# Patient Record
Sex: Female | Born: 1955 | Race: Black or African American | Hispanic: No | Marital: Single | Smoking: Never smoker
Health system: Southern US, Community
[De-identification: ages and names within clinical notes are randomized; demographics above are authoritative.]

## PROBLEM LIST (undated history)

## (undated) DIAGNOSIS — R519 Headache, unspecified: Secondary | ICD-10-CM

## (undated) DIAGNOSIS — J309 Allergic rhinitis, unspecified: Secondary | ICD-10-CM

## (undated) DIAGNOSIS — K3184 Gastroparesis: Secondary | ICD-10-CM

## (undated) DIAGNOSIS — Z86010 Personal history of colonic polyps: Secondary | ICD-10-CM

## (undated) DIAGNOSIS — M1990 Unspecified osteoarthritis, unspecified site: Secondary | ICD-10-CM

## (undated) DIAGNOSIS — F419 Anxiety disorder, unspecified: Secondary | ICD-10-CM

## (undated) DIAGNOSIS — R51 Headache: Secondary | ICD-10-CM

## (undated) DIAGNOSIS — Z8669 Personal history of other diseases of the nervous system and sense organs: Secondary | ICD-10-CM

## (undated) DIAGNOSIS — E119 Type 2 diabetes mellitus without complications: Secondary | ICD-10-CM

## (undated) DIAGNOSIS — K219 Gastro-esophageal reflux disease without esophagitis: Secondary | ICD-10-CM

## (undated) DIAGNOSIS — D696 Thrombocytopenia, unspecified: Secondary | ICD-10-CM

## (undated) DIAGNOSIS — Z860101 Personal history of adenomatous and serrated colon polyps: Secondary | ICD-10-CM

## (undated) DIAGNOSIS — I10 Essential (primary) hypertension: Secondary | ICD-10-CM

## (undated) HISTORY — DX: Personal history of other diseases of the nervous system and sense organs: Z86.69

## (undated) HISTORY — DX: Essential (primary) hypertension: I10

## (undated) HISTORY — DX: Type 2 diabetes mellitus without complications: E11.9

## (undated) HISTORY — DX: Personal history of colonic polyps: Z86.010

## (undated) HISTORY — DX: Gastroparesis: K31.84

## (undated) HISTORY — DX: Thrombocytopenia, unspecified: D69.6

## (undated) HISTORY — PX: OTHER SURGICAL HISTORY: SHX169

## (undated) HISTORY — PX: ABDOMINAL HYSTERECTOMY: SHX81

## (undated) HISTORY — DX: Anxiety disorder, unspecified: F41.9

## (undated) HISTORY — DX: Gastro-esophageal reflux disease without esophagitis: K21.9

## (undated) HISTORY — DX: Allergic rhinitis, unspecified: J30.9

## (undated) HISTORY — DX: Personal history of adenomatous and serrated colon polyps: Z86.0101

---

## 2008-07-09 LAB — HM COLONOSCOPY

## 2015-06-25 DIAGNOSIS — E119 Type 2 diabetes mellitus without complications: Secondary | ICD-10-CM | POA: Insufficient documentation

## 2015-06-25 DIAGNOSIS — K219 Gastro-esophageal reflux disease without esophagitis: Secondary | ICD-10-CM | POA: Insufficient documentation

## 2015-06-25 DIAGNOSIS — J310 Chronic rhinitis: Secondary | ICD-10-CM | POA: Insufficient documentation

## 2015-07-15 ENCOUNTER — Ambulatory Visit: Payer: Self-pay | Admitting: Allergy and Immunology

## 2015-07-29 ENCOUNTER — Ambulatory Visit (INDEPENDENT_AMBULATORY_CARE_PROVIDER_SITE_OTHER): Payer: Medicare (Managed Care) | Admitting: Allergy and Immunology

## 2015-07-29 ENCOUNTER — Encounter: Payer: Self-pay | Admitting: Allergy and Immunology

## 2015-07-29 ENCOUNTER — Ambulatory Visit: Payer: Self-pay | Admitting: Allergy and Immunology

## 2015-07-29 VITALS — BP 128/76 | HR 72 | Temp 98.0°F | Resp 18

## 2015-07-29 DIAGNOSIS — J309 Allergic rhinitis, unspecified: Secondary | ICD-10-CM | POA: Diagnosis not present

## 2015-07-29 DIAGNOSIS — J310 Chronic rhinitis: Secondary | ICD-10-CM | POA: Diagnosis not present

## 2015-07-29 DIAGNOSIS — J3089 Other allergic rhinitis: Secondary | ICD-10-CM

## 2015-07-29 NOTE — Patient Instructions (Addendum)
Take Home Sheet  1. Avoidance: Mold   2. Antihistamine: Claritin 10 mg by mouth once daily for runny nose as needed.   3. Nasal Spray: Patanase 2 spray(s) each nostril twice daily for stuffy nose or drainage.       Nasacort AQ 2 sprays each nostril  Midday for stuffy nose.  (STOP Flonase)   4. Nasal Saline wash followed by nasal spray 2-3 times daily as directed.   5. Follow up Visit: 3-4 months or sooner if needed.   Websites that have reliable Patient information: 1. American Academy of Asthma, Allergy, & Immunology: www.aaaai.org 2. Food Allergy Network: www.foodallergy.org 3. Mothers of Asthmatics: www.aanma.org 4. [place]: DiningCalendar.de 5. American College of Allergy, Asthma, & Immunology: https://robertson.info/ or www.acaai.org

## 2015-08-04 MED ORDER — LORATADINE 10 MG PO TABS: 10.0000 mg | ORAL_TABLET | Freq: Every day | ORAL | Status: DC

## 2015-08-04 MED ORDER — TRIAMCINOLONE ACETONIDE 55 MCG/ACT NA AERO: 2.0000 | INHALATION_SPRAY | Freq: Every day | NASAL | Status: DC

## 2015-08-04 NOTE — Progress Notes (Signed)
FOLLOW UP NOTE  RE: Barbara House MRN: [id number] DOB: [birth year] [facility] [place] [address] Date of Office Visit: 07/29/2015  Subjective:  Barbara House is a 59 y.o. female who presents today for Follow-up; Nasal Congestion; and Other   HPI: Barbara House, who has a complex medical history including migraines, diabetes mellitus and gastroesophageal reflux returns to the office in follow-up of mixed rhinitis and associated intermittent cough.  Since her initial visit in August.  She has improved overall but still notes intermittent symptoms.  She finds occasional clear phlegm with her cough but not daily.  She denies discolored drainage, fever, sore throat, headache, disrupted sleep or activity.  She denies other associated or recurring issues or daily symptoms.  She has been using each of the nose sprays once daily.  She wonders about additional management.  She otherwise has no recollection of acute care or emergency department visits, prednisone or recent antibiotics.  Drug Allergies: No Known Allergies  Objective:   Filed Vitals:   07/29/15 1437  BP: 128/76  Pulse: 72  Temp: 98 F (36.7 C)  Resp: 18   Physical Exam  Constitutional: She is well-developed, well-nourished, and in no distress.  HENT:  Head: Atraumatic.  Right Ear: Tympanic membrane and ear canal normal.  Left Ear: Tympanic membrane and ear canal normal.  Nose: Mucosal edema and rhinorrhea present. No epistaxis.  No foreign bodies.  Mouth/Throat: Oropharynx is clear and moist and mucous membranes are normal. No oropharyngeal exudate, posterior oropharyngeal edema or posterior oropharyngeal erythema.  Neck: Neck supple.  Cardiovascular: Normal rate, S1 normal and S2 normal.   Pulmonary/Chest: Effort normal and breath sounds normal. She has no wheezes. She has no rhonchi. She has no rales.  Lymphadenopathy:    She has no  cervical adenopathy.      Assessment:   1. Chronic rhinitis   2. Perennial allergic rhinitis   3.      Previous history of cough, currently asymptomatic with clear lung exam. Plan:     Patient Instructions  Take Home Sheet  1. Avoidance: Mold   2. Antihistamine: Claritin 10 mg by mouth once daily for runny nose as needed.   3. Nasal Spray: Patanase 2 spray(s) each nostril twice daily for stuffy nose or drainage.       Nasacort AQ 2 sprays each nostril  Midday for stuffy nose.  (STOP Flonase)   4. Nasal Saline wash followed by nasal spray 2-3 times daily as directed.   5. Follow up Visit: 3-4 months or sooner if needed.   Websites that have reliable Patient information: 1. American Academy of Asthma, Allergy, & Immunology: www.aaaai.org 2. Food Allergy Network: www.foodallergy.org 3. Mothers of Asthmatics: www.aanma.org 4. [place]: DiningCalendar.de 5. American College of Allergy, Asthma, & Immunology: https://robertson.info/ or www.acaai.org       Roselyn M. Ishmael Holter, MD   VZ:CHYI Wenda Overland, MD

## 2015-08-18 ENCOUNTER — Other Ambulatory Visit: Payer: Self-pay

## 2015-08-18 MED ORDER — AZELASTINE HCL 0.15 % NA SOLN: 1.0000 | Freq: Every evening | NASAL | Status: DC | PRN

## 2015-10-26 DIAGNOSIS — M25531 Pain in right wrist: Secondary | ICD-10-CM | POA: Diagnosis not present

## 2015-10-26 DIAGNOSIS — M25571 Pain in right ankle and joints of right foot: Secondary | ICD-10-CM | POA: Diagnosis not present

## 2015-11-02 DIAGNOSIS — M25531 Pain in right wrist: Secondary | ICD-10-CM | POA: Diagnosis not present

## 2015-11-02 DIAGNOSIS — M25571 Pain in right ankle and joints of right foot: Secondary | ICD-10-CM | POA: Diagnosis not present

## 2015-11-04 DIAGNOSIS — M25571 Pain in right ankle and joints of right foot: Secondary | ICD-10-CM | POA: Diagnosis not present

## 2015-11-04 DIAGNOSIS — M25531 Pain in right wrist: Secondary | ICD-10-CM | POA: Diagnosis not present

## 2015-11-09 DIAGNOSIS — M25571 Pain in right ankle and joints of right foot: Secondary | ICD-10-CM | POA: Diagnosis not present

## 2015-11-09 DIAGNOSIS — M25531 Pain in right wrist: Secondary | ICD-10-CM | POA: Diagnosis not present

## 2015-11-11 DIAGNOSIS — M25571 Pain in right ankle and joints of right foot: Secondary | ICD-10-CM | POA: Diagnosis not present

## 2015-11-11 DIAGNOSIS — M25531 Pain in right wrist: Secondary | ICD-10-CM | POA: Diagnosis not present

## 2015-11-16 DIAGNOSIS — M25531 Pain in right wrist: Secondary | ICD-10-CM | POA: Diagnosis not present

## 2015-11-16 DIAGNOSIS — M25571 Pain in right ankle and joints of right foot: Secondary | ICD-10-CM | POA: Diagnosis not present

## 2015-11-18 DIAGNOSIS — M25531 Pain in right wrist: Secondary | ICD-10-CM | POA: Diagnosis not present

## 2015-11-18 DIAGNOSIS — M25571 Pain in right ankle and joints of right foot: Secondary | ICD-10-CM | POA: Diagnosis not present

## 2015-11-23 DIAGNOSIS — M25571 Pain in right ankle and joints of right foot: Secondary | ICD-10-CM | POA: Diagnosis not present

## 2015-11-23 DIAGNOSIS — M25531 Pain in right wrist: Secondary | ICD-10-CM | POA: Diagnosis not present

## 2015-11-26 DIAGNOSIS — M25571 Pain in right ankle and joints of right foot: Secondary | ICD-10-CM | POA: Diagnosis not present

## 2015-11-26 DIAGNOSIS — M25531 Pain in right wrist: Secondary | ICD-10-CM | POA: Diagnosis not present

## 2015-11-30 DIAGNOSIS — M25571 Pain in right ankle and joints of right foot: Secondary | ICD-10-CM | POA: Diagnosis not present

## 2015-11-30 DIAGNOSIS — M25531 Pain in right wrist: Secondary | ICD-10-CM | POA: Diagnosis not present

## 2015-12-01 DIAGNOSIS — J309 Allergic rhinitis, unspecified: Secondary | ICD-10-CM | POA: Diagnosis not present

## 2015-12-01 DIAGNOSIS — E118 Type 2 diabetes mellitus with unspecified complications: Secondary | ICD-10-CM | POA: Diagnosis not present

## 2016-01-03 DIAGNOSIS — Z1231 Encounter for screening mammogram for malignant neoplasm of breast: Secondary | ICD-10-CM | POA: Diagnosis not present

## 2016-01-10 DIAGNOSIS — R251 Tremor, unspecified: Secondary | ICD-10-CM | POA: Diagnosis not present

## 2016-01-10 DIAGNOSIS — R300 Dysuria: Secondary | ICD-10-CM | POA: Diagnosis not present

## 2016-01-20 DIAGNOSIS — M79671 Pain in right foot: Secondary | ICD-10-CM | POA: Diagnosis not present

## 2016-01-20 DIAGNOSIS — L600 Ingrowing nail: Secondary | ICD-10-CM | POA: Diagnosis not present

## 2016-01-20 DIAGNOSIS — E1149 Type 2 diabetes mellitus with other diabetic neurological complication: Secondary | ICD-10-CM | POA: Diagnosis not present

## 2016-01-20 DIAGNOSIS — M7741 Metatarsalgia, right foot: Secondary | ICD-10-CM | POA: Diagnosis not present

## 2016-02-01 DIAGNOSIS — G249 Dystonia, unspecified: Secondary | ICD-10-CM | POA: Diagnosis not present

## 2016-02-01 DIAGNOSIS — R2689 Other abnormalities of gait and mobility: Secondary | ICD-10-CM | POA: Diagnosis not present

## 2016-02-22 ENCOUNTER — Other Ambulatory Visit: Payer: Self-pay | Admitting: Allergy and Immunology

## 2016-02-23 DIAGNOSIS — H2513 Age-related nuclear cataract, bilateral: Secondary | ICD-10-CM | POA: Diagnosis not present

## 2016-02-23 DIAGNOSIS — E119 Type 2 diabetes mellitus without complications: Secondary | ICD-10-CM | POA: Diagnosis not present

## 2016-03-06 DIAGNOSIS — H40003 Preglaucoma, unspecified, bilateral: Secondary | ICD-10-CM | POA: Diagnosis not present

## 2016-03-06 DIAGNOSIS — G249 Dystonia, unspecified: Secondary | ICD-10-CM | POA: Diagnosis not present

## 2016-03-06 DIAGNOSIS — R2689 Other abnormalities of gait and mobility: Secondary | ICD-10-CM | POA: Diagnosis not present

## 2016-03-27 DIAGNOSIS — H40003 Preglaucoma, unspecified, bilateral: Secondary | ICD-10-CM | POA: Diagnosis not present

## 2016-03-29 DIAGNOSIS — R269 Unspecified abnormalities of gait and mobility: Secondary | ICD-10-CM | POA: Diagnosis not present

## 2016-03-29 DIAGNOSIS — R251 Tremor, unspecified: Secondary | ICD-10-CM | POA: Diagnosis not present

## 2016-03-30 DIAGNOSIS — E1165 Type 2 diabetes mellitus with hyperglycemia: Secondary | ICD-10-CM | POA: Diagnosis not present

## 2016-04-20 DIAGNOSIS — M7741 Metatarsalgia, right foot: Secondary | ICD-10-CM | POA: Diagnosis not present

## 2016-04-20 DIAGNOSIS — E1149 Type 2 diabetes mellitus with other diabetic neurological complication: Secondary | ICD-10-CM | POA: Diagnosis not present

## 2016-04-20 DIAGNOSIS — M79675 Pain in left toe(s): Secondary | ICD-10-CM | POA: Diagnosis not present

## 2016-04-20 DIAGNOSIS — L600 Ingrowing nail: Secondary | ICD-10-CM | POA: Diagnosis not present

## 2016-04-20 DIAGNOSIS — M79674 Pain in right toe(s): Secondary | ICD-10-CM | POA: Diagnosis not present

## 2016-04-20 DIAGNOSIS — M79671 Pain in right foot: Secondary | ICD-10-CM | POA: Diagnosis not present

## 2016-05-22 DIAGNOSIS — M7582 Other shoulder lesions, left shoulder: Secondary | ICD-10-CM | POA: Diagnosis not present

## 2016-07-05 DIAGNOSIS — L820 Inflamed seborrheic keratosis: Secondary | ICD-10-CM | POA: Diagnosis not present

## 2016-07-05 DIAGNOSIS — L309 Dermatitis, unspecified: Secondary | ICD-10-CM | POA: Diagnosis not present

## 2016-07-07 DIAGNOSIS — I209 Angina pectoris, unspecified: Secondary | ICD-10-CM | POA: Diagnosis not present

## 2016-07-07 DIAGNOSIS — J301 Allergic rhinitis due to pollen: Secondary | ICD-10-CM | POA: Diagnosis not present

## 2016-07-07 DIAGNOSIS — R0609 Other forms of dyspnea: Secondary | ICD-10-CM | POA: Diagnosis not present

## 2016-07-10 DIAGNOSIS — R269 Unspecified abnormalities of gait and mobility: Secondary | ICD-10-CM | POA: Diagnosis not present

## 2016-07-10 DIAGNOSIS — R251 Tremor, unspecified: Secondary | ICD-10-CM | POA: Diagnosis not present

## 2016-07-17 DIAGNOSIS — I209 Angina pectoris, unspecified: Secondary | ICD-10-CM | POA: Diagnosis not present

## 2016-07-17 DIAGNOSIS — R0609 Other forms of dyspnea: Secondary | ICD-10-CM | POA: Diagnosis not present

## 2016-07-17 DIAGNOSIS — R079 Chest pain, unspecified: Secondary | ICD-10-CM | POA: Diagnosis not present

## 2016-07-18 DIAGNOSIS — L600 Ingrowing nail: Secondary | ICD-10-CM | POA: Diagnosis not present

## 2016-07-18 DIAGNOSIS — E1149 Type 2 diabetes mellitus with other diabetic neurological complication: Secondary | ICD-10-CM | POA: Diagnosis not present

## 2016-07-26 ENCOUNTER — Encounter: Payer: Self-pay | Admitting: Cardiology

## 2016-07-26 ENCOUNTER — Encounter: Payer: Self-pay | Admitting: *Deleted

## 2016-07-26 DIAGNOSIS — Z1272 Encounter for screening for malignant neoplasm of vagina: Secondary | ICD-10-CM | POA: Diagnosis not present

## 2016-07-26 DIAGNOSIS — Z01419 Encounter for gynecological examination (general) (routine) without abnormal findings: Secondary | ICD-10-CM | POA: Diagnosis not present

## 2016-07-26 DIAGNOSIS — Z6836 Body mass index (BMI) 36.0-36.9, adult: Secondary | ICD-10-CM | POA: Diagnosis not present

## 2016-07-26 NOTE — Progress Notes (Signed)
Cardiology Office Note  Date: 07/27/2016   ID: Barbara House, DOB 09/26/1956, MRN [id number]  PCP: Celedonio Savage, MD  Consulting Cardiologist: Rozann Lesches, MD   Chief Complaint  Patient presents with  . Abnormal stress test    History of Present Illness: Barbara House is a 60 y.o. female referred for cardiology consultation by Dr. Wenda Overland. Limited records were provided. She tells me that she has been experiencing intermittent chest tightness and shortness of breath with activity since September. She recalls a significant episode when she was outdoors trying to help a family member push mow her lawn. She thought that this might have been indigestion, she does take Prilosec for reflux, however these have been much more intense.  She was referred for a Myoview which was done recently at [facility] as outlined below indicating inferior apical ischemia with normal LVEF. We discussed the implications of this test result as it relates to ischemic heart disease, particularly in light of her history of diabetes mellitus. We also discussed options for management including medical therapy and potentially revascularization.  I personally reviewed her ECG today which shows sinus rhythm with nonspecific T-wave changes and Q-wave in lead III.  Past Medical History:  Diagnosis Date  . Allergic rhinitis   . GERD (gastroesophageal reflux disease)   . History of migraine   . Type 2 diabetes mellitus ([place])     Past Surgical History:  Procedure Laterality Date  . Miinor skin surgery      Current Outpatient Prescriptions  Medication Sig Dispense Refill  . aspirin EC 81 MG tablet Take 81 mg by mouth daily.    . Azelastine HCl 0.15 % SOLN Place 1 spray into the nose at bedtime as needed. 30 mL 5  . Benzonatate (TESSALON PERLES PO) Take by mouth as needed.    . busPIRone (BUSPAR) 5 MG tablet Take 5 mg by mouth as needed.    . canagliflozin (INVOKANA) 300 MG TABS tablet Take 300 mg by mouth daily.     . Cholecalciferol (VITAMIN D PO) Take by mouth.    . cyclobenzaprine (FLEXERIL) 10 MG tablet Take 10 mg by mouth as needed for muscle spasms.    . fluticasone (FLONASE) 50 MCG/ACT nasal spray Place 1-2 sprays into both nostrils every morning.    . gabapentin (NEURONTIN) 400 MG capsule Take 400 mg by mouth as needed.    [name] glipiZIDE (GLUCOTROL) 5 MG tablet Take 5 mg by mouth daily.    [name] HYOSCYAMINE SULFATE PO Take by mouth as needed.    [name] lisinopril (PRINIVIL,ZESTRIL) 20 MG tablet Take 20 mg by mouth daily.    [name] loratadine (CLARITIN) 10 MG tablet TAKE ONE TABLET BY MOUTH DAILY 30 tablet 1  . metFORMIN (GLUCOPHAGE) 1000 MG tablet Take 1,000 mg by mouth 2 (two) times daily.    Eddie Candle EX Apply topically as needed.    . Olopatadine HCl (PATANASE NA) Place 1-2 sprays into the nose at bedtime.    [name] omeprazole (PRILOSEC) 20 MG capsule Take 20 mg by mouth daily.    [name] PARoxetine (PAXIL) 20 MG tablet Take 20 mg by mouth daily.    . traMADol (ULTRAM) 50 MG tablet Take by mouth as needed.    . triamcinolone (NASACORT AQ) 55 MCG/ACT AERO nasal inhaler Place 2 sprays into the nose daily. 1 Inhaler 5  . triamcinolone cream (KENALOG) 0.1 % Apply 1 application topically as needed.    . verapamil (VERELAN PM) 240 MG 24 hr  capsule Take 240 mg by mouth daily.     No current facility-administered medications for this visit.    Allergies:  Review of patient's allergies indicates no known allergies.   Social History: The patient  reports that she has never smoked. She has never used smokeless tobacco.   Family History: The patient's family history includes Hypertension in her mother and son.   ROS:  Please see the history of present illness. Otherwise, complete review of systems is positive for reflux.  All other systems are reviewed and negative.   Physical Exam: VS:  BP (!) 148/84   Pulse 77   Ht 5\' 1"  (1.549 m)   Wt 197 lb (89.4 kg)   SpO2 98%   BMI 37.22 kg/m , BMI Body mass index is 37.22  kg/m.  Wt Readings from Last 3 Encounters:  07/27/16 197 lb (89.4 kg)    General: Overweight woman, appears comfortable at rest. HEENT: Conjunctiva and lids normal, oropharynx clear. Neck: Supple, no elevated JVP or carotid bruits, no thyromegaly. Lungs: Clear to auscultation, nonlabored breathing at rest. Cardiac: Regular rate and rhythm, no S3 or significant systolic murmur, no pericardial rub. Abdomen: Soft, nontender, bowel sounds present, no guarding or rebound. Extremities: No pitting edema, distal pulses 1-2+. Skin: Warm and dry. Musculoskeletal: No kyphosis. Neuropsychiatric: Alert and oriented x3, affect grossly appropriate.  ECG: There is no old tracing for comparison.  Other Studies Reviewed Today:  Lexiscan Myoview 07/17/2016 [facility]): No diagnostic ST segment abnormalities. Reported mild, reversible inferior apical defect suggesting ischemia, LVEF 63%. Overall low risk.  Assessment and Plan:  1. Patient presents with symptoms concerning for accelerating exertional angina since September. Recent Myoview at [facility] indicated inferior apical ischemia with normal LVEF. She continues to report regular symptoms. She has a long-standing history of type 2 diabetes mellitus. Today we discussed management options including invasive testing to assess for revascularization options. After reviewing the risks and benefits of a diagnostic cardiac catheterization, she is in agreement to proceed, and this will be scheduled at her convenience.  2. Type 2 diabetes mellitus, followed by Dr. Wenda Overland. She is on Glucophage, Glucotrol, and Invokana.  3. Uncertain lipid status. She is currently not on statin therapy. This will need to be further evaluated.  4. GERD, currently on Prilosec.  Current medicines were reviewed with the patient today.   Orders Placed This Encounter  Procedures  . CBC  . Basic metabolic panel  . APTT  . Protime-INR  . EKG 12-Lead    Disposition: Follow-up  with me after cardiac catheterization.  Signed, Satira Sark, MD, [facility] 07/27/2016 11:42 AM    [place] at [place], [place], [place] [address] Phone: [phone]; Fax: [phone]

## 2016-07-27 ENCOUNTER — Telehealth: Payer: Self-pay | Admitting: Cardiology

## 2016-07-27 ENCOUNTER — Encounter: Payer: Self-pay | Admitting: *Deleted

## 2016-07-27 ENCOUNTER — Other Ambulatory Visit: Payer: Self-pay | Admitting: Cardiology

## 2016-07-27 ENCOUNTER — Ambulatory Visit (INDEPENDENT_AMBULATORY_CARE_PROVIDER_SITE_OTHER): Payer: Medicare Other | Admitting: Cardiology

## 2016-07-27 ENCOUNTER — Encounter: Payer: Self-pay | Admitting: Cardiology

## 2016-07-27 VITALS — BP 148/84 | HR 77 | Ht 61.0 in | Wt 197.0 lb

## 2016-07-27 DIAGNOSIS — I200 Unstable angina: Secondary | ICD-10-CM | POA: Diagnosis not present

## 2016-07-27 DIAGNOSIS — E1140 Type 2 diabetes mellitus with diabetic neuropathy, unspecified: Secondary | ICD-10-CM | POA: Diagnosis not present

## 2016-07-27 DIAGNOSIS — R9439 Abnormal result of other cardiovascular function study: Secondary | ICD-10-CM

## 2016-07-27 DIAGNOSIS — Z01812 Encounter for preprocedural laboratory examination: Secondary | ICD-10-CM

## 2016-07-27 DIAGNOSIS — K219 Gastro-esophageal reflux disease without esophagitis: Secondary | ICD-10-CM | POA: Diagnosis not present

## 2016-07-27 NOTE — Telephone Encounter (Signed)
No precert required.  Pt has Medicare.  Her [place] so may not pay remaining portion if we are out of network

## 2016-07-27 NOTE — Patient Instructions (Signed)
Medication Instructions:  Continue all current medications.  Labwork: BMET, CBC, PT, PTT - orders given today.  Testing/Procedures: Your physician has requested that you have a cardiac catheterization. Cardiac catheterization is used to diagnose and/or treat various heart conditions. Doctors may recommend this procedure for a number of different reasons. The most common reason is to evaluate chest pain. Chest pain can be a symptom of coronary artery disease (CAD), and cardiac catheterization can show whether plaque is narrowing or blocking your heart's arteries. This procedure is also used to evaluate the valves, as well as measure the blood flow and oxygen levels in different parts of your heart. For further information please visit HugeFiesta.tn. Please follow instruction sheet, as given.  Follow-Up: 2-3 weeks post cath   Any Other Special Instructions Will Be Listed Below (If Applicable).  If you need a refill on your cardiac medications before your next appointment, please call your pharmacy.

## 2016-07-27 NOTE — Telephone Encounter (Signed)
Left heart cath - Thursday, 08/03/16 - 7:30 - Barbara House   Checking percert

## 2016-07-28 DIAGNOSIS — I200 Unstable angina: Secondary | ICD-10-CM | POA: Diagnosis not present

## 2016-07-28 DIAGNOSIS — R9439 Abnormal result of other cardiovascular function study: Secondary | ICD-10-CM | POA: Diagnosis not present

## 2016-07-28 DIAGNOSIS — Z01812 Encounter for preprocedural laboratory examination: Secondary | ICD-10-CM | POA: Diagnosis not present

## 2016-07-28 LAB — PROTIME-INR

## 2016-08-03 ENCOUNTER — Ambulatory Visit (HOSPITAL_COMMUNITY)
Admission: RE | Admit: 2016-08-03 | Discharge: 2016-08-03 | Disposition: A | Discharge status: Home / Self Care | Payer: Medicare Other | Source: Ambulatory Visit | Attending: Cardiology | Admitting: Cardiology

## 2016-08-03 ENCOUNTER — Encounter (HOSPITAL_COMMUNITY): Payer: Self-pay | Admitting: Cardiology

## 2016-08-03 ENCOUNTER — Encounter (HOSPITAL_COMMUNITY)
Admission: RE | Disposition: A | Discharge status: Home / Self Care | Payer: Self-pay | Source: Ambulatory Visit | Attending: Cardiology

## 2016-08-03 DIAGNOSIS — G43909 Migraine, unspecified, not intractable, without status migrainosus: Secondary | ICD-10-CM | POA: Diagnosis not present

## 2016-08-03 DIAGNOSIS — Z7982 Long term (current) use of aspirin: Secondary | ICD-10-CM | POA: Diagnosis not present

## 2016-08-03 DIAGNOSIS — I209 Angina pectoris, unspecified: Secondary | ICD-10-CM | POA: Diagnosis present

## 2016-08-03 DIAGNOSIS — J309 Allergic rhinitis, unspecified: Secondary | ICD-10-CM | POA: Insufficient documentation

## 2016-08-03 DIAGNOSIS — E119 Type 2 diabetes mellitus without complications: Secondary | ICD-10-CM

## 2016-08-03 DIAGNOSIS — Z7984 Long term (current) use of oral hypoglycemic drugs: Secondary | ICD-10-CM | POA: Diagnosis not present

## 2016-08-03 DIAGNOSIS — R9439 Abnormal result of other cardiovascular function study: Secondary | ICD-10-CM | POA: Diagnosis present

## 2016-08-03 DIAGNOSIS — Z8249 Family history of ischemic heart disease and other diseases of the circulatory system: Secondary | ICD-10-CM | POA: Diagnosis not present

## 2016-08-03 DIAGNOSIS — Z7951 Long term (current) use of inhaled steroids: Secondary | ICD-10-CM | POA: Diagnosis not present

## 2016-08-03 DIAGNOSIS — K219 Gastro-esophageal reflux disease without esophagitis: Secondary | ICD-10-CM | POA: Diagnosis not present

## 2016-08-03 HISTORY — PX: CARDIAC CATHETERIZATION: SHX172

## 2016-08-03 LAB — GLUCOSE, CAPILLARY: Glucose-Capillary: 141 mg/dL — ABNORMAL HIGH (ref 65–99)

## 2016-08-03 SURGERY — LEFT HEART CATH AND CORONARY ANGIOGRAPHY

## 2016-08-03 MED ORDER — FENTANYL CITRATE (PF) 100 MCG/2ML IJ SOLN
INTRAMUSCULAR | Status: DC | PRN
  Administered 2016-08-03: 25 ug via INTRAVENOUS

## 2016-08-03 MED ORDER — HEPARIN (PORCINE) IN NACL 2-0.9 UNIT/ML-% IJ SOLN
INTRAMUSCULAR | Status: AC
  Filled 2016-08-03: qty 1000

## 2016-08-03 MED ORDER — ATORVASTATIN CALCIUM 80 MG PO TABS
80.0000 mg | ORAL_TABLET | ORAL | Status: AC
  Administered 2016-08-03: 80 mg via ORAL
  Filled 2016-08-03: qty 1

## 2016-08-03 MED ORDER — IOPAMIDOL (ISOVUE-370) INJECTION 76%
INTRAVENOUS | Status: AC
Start: 2016-08-03 — End: 2016-08-03
  Filled 2016-08-03: qty 100

## 2016-08-03 MED ORDER — MIDAZOLAM HCL 2 MG/2ML IJ SOLN
INTRAMUSCULAR | Status: AC
  Filled 2016-08-03: qty 2

## 2016-08-03 MED ORDER — FENTANYL CITRATE (PF) 100 MCG/2ML IJ SOLN
INTRAMUSCULAR | Status: AC
  Filled 2016-08-03: qty 2

## 2016-08-03 MED ORDER — HEPARIN SODIUM (PORCINE) 1000 UNIT/ML IJ SOLN
INTRAMUSCULAR | Status: DC | PRN
Start: 2016-08-03 — End: 2016-08-03
  Administered 2016-08-03: 4500 [IU] via INTRAVENOUS

## 2016-08-03 MED ORDER — ATORVASTATIN CALCIUM 80 MG PO TABS
80.0000 mg | ORAL_TABLET | Freq: Every day | ORAL | Status: DC
  Filled 2016-08-03: qty 1

## 2016-08-03 MED ORDER — MIDAZOLAM HCL 2 MG/2ML IJ SOLN
INTRAMUSCULAR | Status: DC | PRN
Start: 2016-08-03 — End: 2016-08-03
  Administered 2016-08-03: 1 mg via INTRAVENOUS

## 2016-08-03 MED ORDER — ASPIRIN 81 MG PO CHEW
CHEWABLE_TABLET | ORAL | Status: AC
  Filled 2016-08-03: qty 1

## 2016-08-03 MED ORDER — SODIUM CHLORIDE 0.9% FLUSH: 3.0000 mL | Freq: Two times a day (BID) | INTRAVENOUS | Status: DC

## 2016-08-03 MED ORDER — SODIUM CHLORIDE 0.9 % IV SOLN
INTRAVENOUS | Status: DC
Start: 2016-08-03 — End: 2016-08-03
  Administered 2016-08-03: 06:00:00 via INTRAVENOUS

## 2016-08-03 MED ORDER — SODIUM CHLORIDE 0.9 % IV SOLN: 250.0000 mL | INTRAVENOUS | Status: DC | PRN

## 2016-08-03 MED ORDER — SODIUM CHLORIDE 0.9 % WEIGHT BASED INFUSION: 1.0000 mL/kg/h | INTRAVENOUS | Status: DC

## 2016-08-03 MED ORDER — METFORMIN HCL 500 MG PO TABS
1000.0000 mg | ORAL_TABLET | Freq: Every day | ORAL | Status: DC
  Filled 2016-08-03: qty 2

## 2016-08-03 MED ORDER — VERAPAMIL HCL 2.5 MG/ML IV SOLN
INTRAVENOUS | Status: DC | PRN
  Administered 2016-08-03: 10 mL via INTRA_ARTERIAL

## 2016-08-03 MED ORDER — HEPARIN (PORCINE) IN NACL 2-0.9 UNIT/ML-% IJ SOLN
INTRAMUSCULAR | Status: DC | PRN
  Administered 2016-08-03: 500 mL

## 2016-08-03 MED ORDER — HEPARIN SODIUM (PORCINE) 1000 UNIT/ML IJ SOLN
INTRAMUSCULAR | Status: AC
  Filled 2016-08-03: qty 1

## 2016-08-03 MED ORDER — SODIUM CHLORIDE 0.9% FLUSH: 3.0000 mL | INTRAVENOUS | Status: DC | PRN

## 2016-08-03 MED ORDER — LIDOCAINE HCL (PF) 1 % IJ SOLN
INTRAMUSCULAR | Status: AC
  Filled 2016-08-03: qty 30

## 2016-08-03 MED ORDER — IOPAMIDOL (ISOVUE-370) INJECTION 76%
INTRAVENOUS | Status: DC | PRN
  Administered 2016-08-03: 80 mL via INTRA_ARTERIAL

## 2016-08-03 MED ORDER — LIDOCAINE HCL (PF) 1 % IJ SOLN
INTRAMUSCULAR | Status: DC | PRN
  Administered 2016-08-03: 2 mL via INTRADERMAL

## 2016-08-03 MED ORDER — HEPARIN (PORCINE) IN NACL 2-0.9 UNIT/ML-% IJ SOLN
INTRAMUSCULAR | Status: AC
  Filled 2016-08-03: qty 500

## 2016-08-03 MED ORDER — VERAPAMIL HCL 2.5 MG/ML IV SOLN
INTRAVENOUS | Status: AC
  Filled 2016-08-03: qty 2

## 2016-08-03 MED ORDER — ASPIRIN 81 MG PO CHEW
81.0000 mg | CHEWABLE_TABLET | ORAL | Status: AC
  Administered 2016-08-03: 81 mg via ORAL

## 2016-08-03 SURGICAL SUPPLY — 12 items
CATH 5FR JL3.5 JR4 ANG PIG MP (CATHETERS) ×3 IMPLANT
COVER PRB 48X5XTLSCP FOLD TPE (BAG) ×1 IMPLANT
COVER PROBE 5X48 (BAG) ×2
DEVICE RAD COMP TR BAND LRG (VASCULAR PRODUCTS) ×3 IMPLANT
GLIDESHEATH SLEND SS 6F .021 (SHEATH) ×3 IMPLANT
KIT HEART LEFT (KITS) ×3 IMPLANT
PACK CARDIAC CATHETERIZATION (CUSTOM PROCEDURE TRAY) ×3 IMPLANT
SYR MEDRAD MARK V 150ML (SYRINGE) ×3 IMPLANT
TRANSDUCER W/STOPCOCK (MISCELLANEOUS) ×3 IMPLANT
TUBING CIL FLEX 10 FLL-RA (TUBING) ×3 IMPLANT
WIRE HI TORQ VERSACORE-J 145CM (WIRE) ×3 IMPLANT
WIRE SAFE-T 1.5MM-J .035X260CM (WIRE) ×3 IMPLANT

## 2016-08-03 NOTE — Interval H&P Note (Signed)
History and Physical Interval Note:  08/03/2016 7:22 AM  Barbara House  has presented today for surgery, with the diagnosis of abnormal stress test  The various methods of treatment have been discussed with the patient and family. After consideration of risks, benefits and other options for treatment, the patient has consented to  Procedure(s): Left Heart Cath and Coronary Angiography (N/A) as a surgical intervention .  The patient's history has been reviewed, patient examined, no change in status, stable for surgery.  I have reviewed the patient's chart and labs.  Questions were answered to the patient's satisfaction.    Cath Lab Visit (complete for each Cath Lab visit)  Clinical Evaluation Leading to the Procedure:   ACS: No.  Non-ACS:    Anginal Classification: CCS III  Anti-ischemic medical therapy: Minimal Therapy (1 class of medications)  Non-Invasive Test Results: Low-risk stress test findings: cardiac mortality <1%/year  Prior CABG: No previous CABG       [facility] 08/03/2016 7:22 AM

## 2016-08-03 NOTE — Discharge Instructions (Signed)
Radial Site Care °Refer to this sheet in the next few weeks. These instructions provide you with information about caring for yourself after your procedure. Your health care provider may also give you more specific instructions. Your treatment has been planned according to current medical practices, but problems sometimes occur. Call your health care provider if you have any problems or questions after your procedure. °WHAT TO EXPECT AFTER THE PROCEDURE °After your procedure, it is typical to have the following: °· Bruising at the radial site that usually fades within 1-2 weeks. °· Blood collecting in the tissue (hematoma) that may be painful to the touch. It should usually decrease in size and tenderness within 1-2 weeks. °HOME CARE INSTRUCTIONS °· Take medicines only as directed by your health care provider. °· You may shower 24-48 hours after the procedure or as directed by your health care provider. Remove the bandage (dressing) and gently wash the site with plain soap and water. Pat the area dry with a clean towel. Do not rub the site, because this may cause bleeding. °· Do not take baths, swim, or use a hot tub until your health care provider approves. °· Check your insertion site every day for redness, swelling, or drainage. °· Do not apply powder or lotion to the site. °· Do not flex or bend the affected arm for 24 hours or as directed by your health care provider. °· Do not push or pull heavy objects with the affected arm for 24 hours or as directed by your health care provider. °· Do not lift over 10 lb (4.5 kg) for 5 days after your procedure or as directed by your health care provider. °· Ask your health care provider when it is okay to: °¨ Return to work or school. °¨ Resume usual physical activities or sports. °¨ Resume sexual activity. °· Do not drive home if you are discharged the same day as the procedure. Have someone else drive you. °· You may drive 24 hours after the procedure unless otherwise  instructed by your health care provider. °· Do not operate machinery or power tools for 24 hours after the procedure. °· If your procedure was done as an outpatient procedure, which means that you went home the same day as your procedure, a responsible adult should be with you for the first 24 hours after you arrive home. °· Keep all follow-up visits as directed by your health care provider. This is important. °SEEK MEDICAL CARE IF: °· You have a fever. °· You have chills. °· You have increased bleeding from the radial site. Hold pressure on the site. °SEEK IMMEDIATE MEDICAL CARE IF: °· You have unusual pain at the radial site. °· You have redness, warmth, or swelling at the radial site. °· You have drainage (other than a small amount of blood on the dressing) from the radial site. °· The radial site is bleeding, and the bleeding does not stop after 30 minutes of holding steady pressure on the site. °· Your arm or hand becomes pale, cool, tingly, or numb. °  °This information is not intended to replace advice given to you by your health care provider. Make sure you discuss any questions you have with your health care provider. °  °Document Released: 11/04/2010 Document Revised: 10/23/2014 Document Reviewed: 04/20/2014 °Elsevier Interactive Patient Education ©2016 Elsevier Inc. ° °

## 2016-08-03 NOTE — H&P (View-Only) (Signed)
Cardiology Office Note  Date: 07/27/2016   ID: Barbara House, DOB 03-25-1956, MRN [id number]  PCP: Celedonio Savage, MD  Consulting Cardiologist: Rozann Lesches, MD   Chief Complaint  Patient presents with  . Abnormal stress test    History of Present Illness: Barbara House is a 60 y.o. female referred for cardiology consultation by Dr. Wenda Overland. Limited records were provided. She tells me that she has been experiencing intermittent chest tightness and shortness of breath with activity since September. She recalls a significant episode when she was outdoors trying to help a family member push mow her lawn. She thought that this might have been indigestion, she does take Prilosec for reflux, however these have been much more intense.  She was referred for a Myoview which was done recently at [facility] as outlined below indicating inferior apical ischemia with normal LVEF. We discussed the implications of this test result as it relates to ischemic heart disease, particularly in light of her history of diabetes mellitus. We also discussed options for management including medical therapy and potentially revascularization.  I personally reviewed her ECG today which shows sinus rhythm with nonspecific T-wave changes and Q-wave in lead III.  Past Medical History:  Diagnosis Date  . Allergic rhinitis   . GERD (gastroesophageal reflux disease)   . History of migraine   . Type 2 diabetes mellitus ([place])     Past Surgical History:  Procedure Laterality Date  . Miinor skin surgery      Current Outpatient Prescriptions  Medication Sig Dispense Refill  . aspirin EC 81 MG tablet Take 81 mg by mouth daily.    . Azelastine HCl 0.15 % SOLN Place 1 spray into the nose at bedtime as needed. 30 mL 5  . Benzonatate (TESSALON PERLES PO) Take by mouth as needed.    . busPIRone (BUSPAR) 5 MG tablet Take 5 mg by mouth as needed.    . canagliflozin (INVOKANA) 300 MG TABS tablet Take 300 mg by mouth daily.     . Cholecalciferol (VITAMIN D PO) Take by mouth.    . cyclobenzaprine (FLEXERIL) 10 MG tablet Take 10 mg by mouth as needed for muscle spasms.    . fluticasone (FLONASE) 50 MCG/ACT nasal spray Place 1-2 sprays into both nostrils every morning.    . gabapentin (NEURONTIN) 400 MG capsule Take 400 mg by mouth as needed.    [name] glipiZIDE (GLUCOTROL) 5 MG tablet Take 5 mg by mouth daily.    [name] HYOSCYAMINE SULFATE PO Take by mouth as needed.    [name] lisinopril (PRINIVIL,ZESTRIL) 20 MG tablet Take 20 mg by mouth daily.    [name] loratadine (CLARITIN) 10 MG tablet TAKE ONE TABLET BY MOUTH DAILY 30 tablet 1  . metFORMIN (GLUCOPHAGE) 1000 MG tablet Take 1,000 mg by mouth 2 (two) times daily.    Eddie Candle EX Apply topically as needed.    . Olopatadine HCl (PATANASE NA) Place 1-2 sprays into the nose at bedtime.    [name] omeprazole (PRILOSEC) 20 MG capsule Take 20 mg by mouth daily.    [name] PARoxetine (PAXIL) 20 MG tablet Take 20 mg by mouth daily.    . traMADol (ULTRAM) 50 MG tablet Take by mouth as needed.    . triamcinolone (NASACORT AQ) 55 MCG/ACT AERO nasal inhaler Place 2 sprays into the nose daily. 1 Inhaler 5  . triamcinolone cream (KENALOG) 0.1 % Apply 1 application topically as needed.    . verapamil (VERELAN PM) 240 MG 24 hr  capsule Take 240 mg by mouth daily.     No current facility-administered medications for this visit.    Allergies:  Review of patient's allergies indicates no known allergies.   Social History: The patient  reports that she has never smoked. She has never used smokeless tobacco.   Family History: The patient's family history includes Hypertension in her mother and son.   ROS:  Please see the history of present illness. Otherwise, complete review of systems is positive for reflux.  All other systems are reviewed and negative.   Physical Exam: VS:  BP (!) 148/84   Pulse 77   Ht 5\' 1"  (1.549 m)   Wt 197 lb (89.4 kg)   SpO2 98%   BMI 37.22 kg/m , BMI Body mass index is 37.22  kg/m.  Wt Readings from Last 3 Encounters:  07/27/16 197 lb (89.4 kg)    General: Overweight woman, appears comfortable at rest. HEENT: Conjunctiva and lids normal, oropharynx clear. Neck: Supple, no elevated JVP or carotid bruits, no thyromegaly. Lungs: Clear to auscultation, nonlabored breathing at rest. Cardiac: Regular rate and rhythm, no S3 or significant systolic murmur, no pericardial rub. Abdomen: Soft, nontender, bowel sounds present, no guarding or rebound. Extremities: No pitting edema, distal pulses 1-2+. Skin: Warm and dry. Musculoskeletal: No kyphosis. Neuropsychiatric: Alert and oriented x3, affect grossly appropriate.  ECG: There is no old tracing for comparison.  Other Studies Reviewed Today:  Lexiscan Myoview 07/17/2016 [facility]): No diagnostic ST segment abnormalities. Reported mild, reversible inferior apical defect suggesting ischemia, LVEF 63%. Overall low risk.  Assessment and Plan:  1. Patient presents with symptoms concerning for accelerating exertional angina since September. Recent Myoview at [facility] indicated inferior apical ischemia with normal LVEF. She continues to report regular symptoms. She has a long-standing history of type 2 diabetes mellitus. Today we discussed management options including invasive testing to assess for revascularization options. After reviewing the risks and benefits of a diagnostic cardiac catheterization, she is in agreement to proceed, and this will be scheduled at her convenience.  2. Type 2 diabetes mellitus, followed by Dr. Wenda Overland. She is on Glucophage, Glucotrol, and Invokana.  3. Uncertain lipid status. She is currently not on statin therapy. This will need to be further evaluated.  4. GERD, currently on Prilosec.  Current medicines were reviewed with the patient today.   Orders Placed This Encounter  Procedures  . CBC  . Basic metabolic panel  . APTT  . Protime-INR  . EKG 12-Lead    Disposition: Follow-up  with me after cardiac catheterization.  Signed, Satira Sark, MD, [facility] 07/27/2016 11:42 AM    [place] at [place], [place], [place] [address] Phone: [phone]; Fax: [phone]

## 2016-08-04 DIAGNOSIS — Z124 Encounter for screening for malignant neoplasm of cervix: Secondary | ICD-10-CM | POA: Diagnosis not present

## 2016-08-04 DIAGNOSIS — E1165 Type 2 diabetes mellitus with hyperglycemia: Secondary | ICD-10-CM | POA: Diagnosis not present

## 2016-08-04 DIAGNOSIS — E559 Vitamin D deficiency, unspecified: Secondary | ICD-10-CM | POA: Diagnosis not present

## 2016-08-04 DIAGNOSIS — I10 Essential (primary) hypertension: Secondary | ICD-10-CM | POA: Diagnosis not present

## 2016-08-04 DIAGNOSIS — Z1231 Encounter for screening mammogram for malignant neoplasm of breast: Secondary | ICD-10-CM | POA: Diagnosis not present

## 2016-08-04 DIAGNOSIS — E7801 Familial hypercholesterolemia: Secondary | ICD-10-CM | POA: Diagnosis not present

## 2016-08-04 DIAGNOSIS — Z6826 Body mass index (BMI) 26.0-26.9, adult: Secondary | ICD-10-CM | POA: Diagnosis not present

## 2016-08-04 DIAGNOSIS — I999 Unspecified disorder of circulatory system: Secondary | ICD-10-CM | POA: Diagnosis not present

## 2016-08-04 DIAGNOSIS — Z1211 Encounter for screening for malignant neoplasm of colon: Secondary | ICD-10-CM | POA: Diagnosis not present

## 2016-08-04 DIAGNOSIS — Z0000 Encounter for general adult medical examination without abnormal findings: Secondary | ICD-10-CM | POA: Diagnosis not present

## 2016-08-21 NOTE — Progress Notes (Signed)
Cardiology Office Note  Date: 08/23/2016   ID: NAYOMI BARUCH, DOB 1956-10-06, MRN [id number]  PCP: Celedonio Savage, MD  Primary Cardiologist: Rozann Lesches, MD   Chief Complaint  Patient presents with  . Follow-up cardiac catheterization    History of Present Illness: ARLYCE WOLLARD is a 60 y.o. female seen recently in consultation in follow-up of an abnormal myocardial perfusion study done at [facility] with symptoms concerning for angina.. She was referred for a diagnostic cardiac catheterization performed by Dr. [place] which fortunately revealed normal coronary arteries and LVEF, moderately elevated LVEDP.  She presents today for follow-up. We discussed the results of the procedure. She has a recent URI, otherwise no major change in symptoms. I reviewed her medications and we discussed the possibility of her starting on a low-dose diuretic with Dr. Wenda Overland such as chlorthalidone. If any of her symptoms are related to diastolic dysfunction with increased LV filling pressure, this may be helpful.  Past Medical History:  Diagnosis Date  . Allergic rhinitis   . GERD (gastroesophageal reflux disease)   . History of migraine   . Type 2 diabetes mellitus ([place])     Current Outpatient Prescriptions  Medication Sig Dispense Refill  . aspirin EC 81 MG tablet Take 81 mg by mouth daily.    [name] augmented betamethasone dipropionate (DIPROLENE-AF) 0.05 % cream Apply 1 application topically 2 (two) times daily as needed (for itching).    . Azelastine HCl 0.15 % SOLN Place 1 spray into the nose at bedtime as needed. 30 mL 5  . benzonatate (TESSALON) 100 MG capsule Take 100 mg by mouth 3 (three) times daily as needed for cough.    . busPIRone (BUSPAR) 10 MG tablet Take 10 mg by mouth 3 (three) times daily as needed (for anxiety).    . canagliflozin (INVOKANA) 300 MG TABS tablet Take 300 mg by mouth daily.    . cholecalciferol (VITAMIN D) 1000 units tablet Take 1,000 Units by mouth daily.    .  cycloSPORINE (RESTASIS) 0.05 % ophthalmic emulsion Place 1 drop into both eyes 2 (two) times daily as needed (for eye dryness.).    [name] fluticasone (FLONASE) 50 MCG/ACT nasal spray Place 1-2 sprays into both nostrils daily as needed (for sinus issues.).     [name] glipiZIDE (GLUCOTROL) 5 MG tablet Take 5 mg by mouth 2 (two) times daily.     [name] lisinopril (PRINIVIL,ZESTRIL) 20 MG tablet Take 20 mg by mouth every evening.     . loratadine (CLARITIN) 10 MG tablet TAKE ONE TABLET BY MOUTH DAILY (Patient taking differently: TAKE ONE TABLET BY MOUTH DAILY AS NEEDED FOR ALLERGIES.) 30 tablet 1  . metFORMIN (GLUCOPHAGE) 1000 MG tablet Take 1 tablet by mouth 2 (two) times daily.    . montelukast (SINGULAIR) 10 MG tablet Take 10 mg by mouth at bedtime as needed (for allergies.).    [name] Olopatadine HCl (PATANASE NA) Place 1-2 sprays into the nose at bedtime as needed (for sinus drainage.).     [name] omeprazole (PRILOSEC) 20 MG capsule Take 20 mg by mouth daily.    [name] PARoxetine (PAXIL) 20 MG tablet Take 20 mg by mouth every evening.     . traZODone (DESYREL) 50 MG tablet Take 50-100 mg by mouth at bedtime as needed for sleep.    [name] triamcinolone (NASACORT AQ) 55 MCG/ACT AERO nasal inhaler Place 2 sprays into the nose daily. 1 Inhaler 5  . trihexyphenidyl (ARTANE) 2 MG tablet Take 1 mg by  mouth 3 (three) times daily.    . verapamil (CALAN-SR) 240 MG CR tablet Take 240 mg by mouth every evening.     No current facility-administered medications for this visit.    Allergies:  Patient has no known allergies.   Social History: The patient  reports that she has never smoked. She has never used smokeless tobacco.   ROS:  Please see the history of present illness. Otherwise, complete review of systems is positive for cold symptoms.  All other systems are reviewed and negative.   Physical Exam: VS:  BP (!) 147/65   Pulse 91   Ht 5\' 1"  (1.549 m)   Wt 195 lb (88.5 kg)   BMI 36.84 kg/m , BMI Body mass index is 36.84  kg/m.  Wt Readings from Last 3 Encounters:  08/23/16 195 lb (88.5 kg)  08/03/16 194 lb (88 kg)  07/27/16 197 lb (89.4 kg)    General: Overweight woman, appears comfortable at rest. HEENT: Conjunctiva and lids normal, oropharynx clear. Neck: Supple, no elevated JVP or carotid bruits, no thyromegaly. Lungs: Clear to auscultation, nonlabored breathing at rest. Cardiac: Regular rate and rhythm, no S3 or significant systolic murmur, no pericardial rub. Abdomen: Soft, nontender, bowel sounds present, no guarding or rebound. Extremities: No pitting edema, distal pulses 1-2+. Healed right radial arteriotomy site.  ECG: I personally reviewed the tracing from 07/27/2016 which showed sinus rhythm with nonspecific T-wave changes and Q-wave in lead III.  Recent Labwork:  October 2017: Hemoglobin 13.4, platelets 97, BUN 15, creatinine 0.8, potassium 4.3, INR 1.0  Other Studies Reviewed Today:  Cardiac catheterization 08/03/2016:  The left ventricular systolic function is normal.  LV end diastolic pressure is moderately elevated.   1. Normal coronary anatomy 2. Normal LV function 3. Elevated LVEDP  Assessment and Plan:  1. Exertional chest discomfort and abnormal Myoview with subsequent cardiac catheterization showing no evidence of obstructive CAD. She did have elevated LV filling pressures in the setting of diabetes mellitus and hypertension. She does not require any further cardiac testing at this time. Consider adding a low dose diuretic such as chlorthalidone to her antihypertensive regimen as this may help with symptoms. Also tight control of diabetes mellitus and weight loss would be helpful.  2. Uncertain lipid status. Keep follow with Dr. Wenda Overland, consider FLP in light of diabetes mellitus.  Current medicines were reviewed with the patient today.  Disposition: Follow-up as needed.  Signed, Satira Sark, MD, [facility] 08/23/2016 11:18 AM    [place] at  [place], [address] [place], [place] [address] Phone: [phone]; Fax: [phone]

## 2016-08-23 ENCOUNTER — Ambulatory Visit (INDEPENDENT_AMBULATORY_CARE_PROVIDER_SITE_OTHER): Payer: Medicare Other | Admitting: Cardiology

## 2016-08-23 ENCOUNTER — Encounter: Payer: Self-pay | Admitting: Cardiology

## 2016-08-23 VITALS — BP 147/65 | HR 91 | Ht 61.0 in | Wt 195.0 lb

## 2016-08-23 DIAGNOSIS — Z0389 Encounter for observation for other suspected diseases and conditions ruled out: Secondary | ICD-10-CM

## 2016-08-23 DIAGNOSIS — IMO0001 Reserved for inherently not codable concepts without codable children: Secondary | ICD-10-CM

## 2016-08-23 DIAGNOSIS — I200 Unstable angina: Secondary | ICD-10-CM

## 2016-08-23 DIAGNOSIS — R9439 Abnormal result of other cardiovascular function study: Secondary | ICD-10-CM

## 2016-08-23 DIAGNOSIS — E1140 Type 2 diabetes mellitus with diabetic neuropathy, unspecified: Secondary | ICD-10-CM | POA: Diagnosis not present

## 2016-08-23 NOTE — Patient Instructions (Signed)
Your physician recommends that you schedule a follow-up appointment in: AS NEEDED WITH DR MCDOWELL  Your physician recommends that you continue on your current medications as directed. Please refer to the Current Medication list given to you today.  Thank you for choosing [facility]!!    

## 2016-09-13 DIAGNOSIS — N200 Calculus of kidney: Secondary | ICD-10-CM | POA: Diagnosis not present

## 2016-09-14 ENCOUNTER — Ambulatory Visit: Payer: Medicare (Managed Care) | Admitting: Allergy & Immunology

## 2016-09-14 ENCOUNTER — Encounter (INDEPENDENT_AMBULATORY_CARE_PROVIDER_SITE_OTHER): Payer: Self-pay

## 2016-09-14 ENCOUNTER — Ambulatory Visit (INDEPENDENT_AMBULATORY_CARE_PROVIDER_SITE_OTHER): Payer: Medicare Other | Admitting: Allergy & Immunology

## 2016-09-14 ENCOUNTER — Encounter: Payer: Self-pay | Admitting: Allergy & Immunology

## 2016-09-14 VITALS — BP 126/72 | HR 58 | Temp 98.0°F | Resp 18 | Ht 60.5 in | Wt 191.2 lb

## 2016-09-14 DIAGNOSIS — M79675 Pain in left toe(s): Secondary | ICD-10-CM | POA: Diagnosis not present

## 2016-09-14 DIAGNOSIS — J0100 Acute maxillary sinusitis, unspecified: Secondary | ICD-10-CM | POA: Diagnosis not present

## 2016-09-14 DIAGNOSIS — J3089 Other allergic rhinitis: Secondary | ICD-10-CM | POA: Diagnosis not present

## 2016-09-14 DIAGNOSIS — M79674 Pain in right toe(s): Secondary | ICD-10-CM | POA: Diagnosis not present

## 2016-09-14 DIAGNOSIS — L600 Ingrowing nail: Secondary | ICD-10-CM | POA: Diagnosis not present

## 2016-09-14 MED ORDER — FLUTICASONE PROPIONATE 50 MCG/ACT NA SUSP: 1.0000 | Freq: Every day | NASAL | 5 refills | Status: DC | PRN

## 2016-09-14 MED ORDER — AMOXICILLIN-POT CLAVULANATE 875-125 MG PO TABS: 1.0000 | ORAL_TABLET | Freq: Two times a day (BID) | ORAL | 0 refills | Status: AC

## 2016-09-14 MED ORDER — OLOPATADINE HCL 0.6 % NA SOLN: NASAL | 5 refills | Status: DC

## 2016-09-14 NOTE — Progress Notes (Signed)
FOLLOW UP  Date of Service/Encounter:  09/14/16   Assessment:   Acute non-recurrent maxillary sinusitis  Perennial allergic rhinitis   Plan/Recommendations:   1. Acute non-recurrent maxillary sinusitis  - Start Augmentin 875mg  one tablet twice daily for 14 days. - Use nasal saline rinses 1-2 times per day to help keep things clear. - Continue with Flonase 2 sprays per nostril daily (samples provided) - Continue with Patanase 2 sprays per nostril twice daily as needed.  - Use Allegra or Xyzal as needed for breakthrough symptoms (samples provided) - Deferred use of prednisone since she has had an adverse reaction to prednisone in the past (hair loss).  2. Return in about 3 months (around 12/13/2016) in [place]  Subjective:   Barbara House is a 60 y.o. female presenting today for follow up of  Chief Complaint  Patient presents with  . Nasal Congestion    over 2 weeks  . Cough    productive cough (yellow)  .  Barbara House has a history of the following: Patient Active Problem List   Diagnosis Date Noted  . Angina pectoris ([place]) 08/03/2016  . Abnormal nuclear stress test 08/03/2016  . Abnormal myocardial perfusion study   . Rhinitis 06/25/2015  . GERD (gastroesophageal reflux disease) 06/25/2015  . Diabetes mellitus ([place]) 06/25/2015    History obtained from: chart review and patient.  Barbara House was referred by Celedonio Savage, MD.     Barbara House is a 60 y.o. female presenting for a follow up visit. Her last visit was one year ago with Dr. Ishmael Holter who has since left the practice. At that time, she was continued on her allergy regimen including Claritin 10mg  daily, Patanase 2 sprays per nostril BID, and Nasacort 2 sprays per nostril midday for nasal congestion. She was also encouraged to use nasal saline. She was instructed to follow up in 3-4 months but she never came back.   She reports that she has been sick for over two weeks with copious phlegm with left  sinus pressure. She is having dark yellow with blood tinged. She endorses "burning" with breathing as well as an "earthy cough" and dry mouth. She did purchase the Mucinex which seems to have helped somewhat. She is not using nasal saline rinses. She does have a history of allergic rhinitis and has Flonase which she uses only as needed. Previous tested has been positive to mold. She does not have any asthma or other atopic disease.  Otherwise, there have been no changes to her past medical history, surgical history, family history, or social history.    Review of Systems: a 14-point review of systems is pertinent for what is mentioned in HPI.  Otherwise, all other systems were negative. Constitutional: negative other than that listed in the HPI Eyes: negative other than that listed in the HPI Ears, nose, mouth, throat, and face: negative other than that listed in the HPI Respiratory: negative other than that listed in the HPI Cardiovascular: negative other than that listed in the HPI Gastrointestinal: negative other than that listed in the HPI Genitourinary: negative other than that listed in the HPI Integument: negative other than that listed in the HPI Hematologic: negative other than that listed in the HPI Musculoskeletal: negative other than that listed in the HPI Neurological: negative other than that listed in the HPI Allergy/Immunologic: negative other than that listed in the HPI    Objective:   Blood pressure 126/72, pulse (!) 58, temperature 98 F (36.7 C),  temperature source Oral, resp. rate 18, height 5' 0.5" (1.537 m), weight 191 lb 3.2 oz (86.7 kg), SpO2 96 %. Body mass index is 36.73 kg/m.   Physical Exam:  General: Alert, interactive, in no acute distress. Cooperative with the exam. Very friendly and appreciative.  HEENT: TMs pearly gray, turbinates markedly edematous with thick discharge, post-pharynx markedly erythematous. Neck: Supple without  thyromegaly. Lungs: Clear to auscultation without wheezing, rhonchi or rales. No increased work of breathing. CV: Normal S1/S2, no murmurs. Capillary refill <2 seconds.  Abdomen: Nondistended, nontender. No guarding or rebound tenderness. Bowel sounds present in all fields and hyperactive  Skin: Warm and dry, without lesions or rashes. Extremities:  No clubbing, cyanosis or edema. Neuro:   Grossly intact.  Diagnostic studies: None     Salvatore Marvel, MD [place] [facility] [place]

## 2016-09-14 NOTE — Patient Instructions (Addendum)
1. Acute non-recurrent maxillary sinusitis  - Start Augmentin 875mg  one tablet twice daily for 14 days. - Use nasal saline rinses 1-2 times per day to help keep things clear. - Continue with Flonase 2 sprays per nostril daily.  - Continue with Patanase 2 sprays per nostril twice daily as needed.  - Use Allegra or Xyzal as needed for breakthrough symptoms.   2. Return in about 3 months (around 12/13/2016) in [place]  Please inform us of any Emergency Department visits, hospitalizations, or changes in symptoms. Call us before going to the ED for breathing or allergy symptoms since we might be able to fit you in for a sick visit. Feel free to contact us anytime with any questions, problems, or concerns.  It was a pleasure to meet you today!   Websites that have reliable patient information: 1. American Academy of Asthma, Allergy, and Immunology: www.aaaai.org 2. Food Allergy Research and Education (FARE): foodallergy.org 3. Mothers of Asthmatics: http://www.asthmacommunitynetwork.org 4. American College of Allergy, Asthma, and Immunology: www.acaai.org

## 2016-10-02 DIAGNOSIS — W6149XA Other contact with turkey, initial encounter: Secondary | ICD-10-CM | POA: Diagnosis not present

## 2016-10-02 DIAGNOSIS — M25532 Pain in left wrist: Secondary | ICD-10-CM | POA: Diagnosis not present

## 2016-10-02 DIAGNOSIS — M79642 Pain in left hand: Secondary | ICD-10-CM | POA: Diagnosis not present

## 2016-10-02 DIAGNOSIS — N200 Calculus of kidney: Secondary | ICD-10-CM | POA: Diagnosis not present

## 2016-10-02 DIAGNOSIS — S6990XA Unspecified injury of unspecified wrist, hand and finger(s), initial encounter: Secondary | ICD-10-CM | POA: Diagnosis not present

## 2016-10-02 DIAGNOSIS — F329 Major depressive disorder, single episode, unspecified: Secondary | ICD-10-CM | POA: Diagnosis not present

## 2016-10-02 DIAGNOSIS — S61211A Laceration without foreign body of left index finger without damage to nail, initial encounter: Secondary | ICD-10-CM | POA: Diagnosis not present

## 2016-10-02 DIAGNOSIS — E119 Type 2 diabetes mellitus without complications: Secondary | ICD-10-CM | POA: Diagnosis not present

## 2016-10-02 DIAGNOSIS — Z888 Allergy status to other drugs, medicaments and biological substances status: Secondary | ICD-10-CM | POA: Diagnosis not present

## 2016-11-03 DIAGNOSIS — M778 Other enthesopathies, not elsewhere classified: Secondary | ICD-10-CM | POA: Diagnosis not present

## 2016-11-03 DIAGNOSIS — G5602 Carpal tunnel syndrome, left upper limb: Secondary | ICD-10-CM | POA: Diagnosis not present

## 2016-11-06 ENCOUNTER — Telehealth: Payer: Self-pay | Admitting: *Deleted

## 2016-11-06 NOTE — Telephone Encounter (Signed)
Pt has a question regarding her bill. Would like a return call asap

## 2016-11-07 NOTE — Telephone Encounter (Signed)
No answer - no voicemell

## 2016-11-08 NOTE — Telephone Encounter (Signed)
Pt will try to pay on MM & Epic month or every other month

## 2016-11-14 DIAGNOSIS — E1149 Type 2 diabetes mellitus with other diabetic neurological complication: Secondary | ICD-10-CM | POA: Diagnosis not present

## 2016-11-14 DIAGNOSIS — L600 Ingrowing nail: Secondary | ICD-10-CM | POA: Diagnosis not present

## 2016-11-29 DIAGNOSIS — B309 Viral conjunctivitis, unspecified: Secondary | ICD-10-CM | POA: Diagnosis not present

## 2016-11-29 DIAGNOSIS — J069 Acute upper respiratory infection, unspecified: Secondary | ICD-10-CM | POA: Diagnosis not present

## 2016-11-30 DIAGNOSIS — H1033 Unspecified acute conjunctivitis, bilateral: Secondary | ICD-10-CM | POA: Diagnosis not present

## 2016-12-07 DIAGNOSIS — E1165 Type 2 diabetes mellitus with hyperglycemia: Secondary | ICD-10-CM | POA: Diagnosis not present

## 2016-12-08 DIAGNOSIS — H1033 Unspecified acute conjunctivitis, bilateral: Secondary | ICD-10-CM | POA: Diagnosis not present

## 2016-12-14 ENCOUNTER — Ambulatory Visit: Payer: Medicare Other | Admitting: Allergy & Immunology

## 2016-12-19 DIAGNOSIS — L03031 Cellulitis of right toe: Secondary | ICD-10-CM | POA: Diagnosis not present

## 2016-12-20 DIAGNOSIS — H538 Other visual disturbances: Secondary | ICD-10-CM | POA: Diagnosis not present

## 2016-12-20 DIAGNOSIS — H524 Presbyopia: Secondary | ICD-10-CM | POA: Diagnosis not present

## 2016-12-20 DIAGNOSIS — H5213 Myopia, bilateral: Secondary | ICD-10-CM | POA: Diagnosis not present

## 2017-01-02 ENCOUNTER — Encounter: Payer: Self-pay | Admitting: Allergy & Immunology

## 2017-01-02 ENCOUNTER — Ambulatory Visit (INDEPENDENT_AMBULATORY_CARE_PROVIDER_SITE_OTHER): Payer: Medicare Other | Admitting: Allergy & Immunology

## 2017-01-02 VITALS — BP 120/70 | HR 60 | Temp 97.7°F | Resp 16

## 2017-01-02 DIAGNOSIS — K219 Gastro-esophageal reflux disease without esophagitis: Secondary | ICD-10-CM | POA: Diagnosis not present

## 2017-01-02 DIAGNOSIS — J3089 Other allergic rhinitis: Secondary | ICD-10-CM

## 2017-01-02 MED ORDER — LEVOCETIRIZINE DIHYDROCHLORIDE 5 MG PO TABS: 5.0000 mg | ORAL_TABLET | Freq: Every evening | ORAL | 3 refills | Status: DC

## 2017-01-02 MED ORDER — DEXLANSOPRAZOLE 30 MG PO CPDR: 30.0000 mg | DELAYED_RELEASE_CAPSULE | Freq: Every day | ORAL | 3 refills | Status: DC

## 2017-01-02 MED ORDER — AMOXICILLIN-POT CLAVULANATE 875-125 MG PO TABS: 1.0000 | ORAL_TABLET | Freq: Two times a day (BID) | ORAL | 0 refills | Status: AC

## 2017-01-02 MED ORDER — OLOPATADINE HCL 0.6 % NA SOLN: NASAL | 3 refills | Status: DC

## 2017-01-02 MED ORDER — MONTELUKAST SODIUM 10 MG PO TABS: 10.0000 mg | ORAL_TABLET | Freq: Every evening | ORAL | 3 refills | Status: DC | PRN

## 2017-01-02 NOTE — Progress Notes (Signed)
FOLLOW UP  Date of Service/Encounter:  01/02/17   Assessment:   Perennial allergic rhinitis  Gastroesophageal reflux disease  Plan/Recommendations:   1. Perennial allergic rhinitis - I would recommend that you use nasal sprays on a daily basis: nasal saline rinses twice daily + Flonase two sprays per nostril twice daily + Patanase two sprays per nostril 1-2 times daily - Continue with montelukast 10mg  daily. - Add Xyzal 5mg  daily.  - These medications need to be used every day for the best effect. - We will give you another course of antibiotics to see if this helps. - If you continue to have problems, we will need to get a sinus CT.  - She has seen an ENT in the past but this has been several years and the ENT has since retired.  2. GERD - Stop the omeprazole and start Dexilant 30mg  daily. - Prescription sent in and samples provided.  3. Return in about 3 months (around 04/04/2017).   Subjective:   Barbara House is a 61 y.o. female presenting today for follow up of  Chief Complaint  Patient presents with  . Otalgia  . Nasal Congestion    Barbara House has a history of the following: Patient Active Problem List   Diagnosis Date Noted  . Angina pectoris ([place]) 08/03/2016  . Abnormal nuclear stress test 08/03/2016  . Abnormal myocardial perfusion study   . Rhinitis 06/25/2015  . GERD (gastroesophageal reflux disease) 06/25/2015  . Diabetes mellitus ([place]) 06/25/2015    History obtained from: chart review and patient, whose history is all over the place.  Barbara House was referred by Celedonio Savage, MD.     Barbara House is a 61 y.o. female presenting for a follow up visit. She was last seen in November 2017. At that time, she was diagnosed with sinusitis and started on 2 weeks of Augmentin. She was encouraged to use nasal saline rinses, Flonase, and Patanase. She was also provided with Allegra and Xyzal samples.  Since last visit, she has continued to have nasal  congestion with sinus pressure. She has constant sinus pressure on the left side. She feels drainage on a nearly continuous basis. She has not had no additional antibiotic since the last time I saw her. However she has not been using her sprays consistently at all, only using them "when she feels really bad". However I cannot seem to figure out what the trigger is exactly, as she also endorses constant sinus pressure and nasal congestion. She is unsure of any particular triggers for her nasal symptoms, as she seems to constantly have problems throughout the year. She does not remember what she was allergic to when she was last skin tested. Per previous notes, she has been sensitized to molds. She has never been on allergy shots.   Barbara House does have a history of GERD and takes a "purple pill" daily. She denies chest pain or pressure and feels that her reflux is under good control. But she does have a cough that is present only at night. She has not changed her GERD medication in quite some time.   Barbara House does have a history of diabetes and hypertension. She has had an abnormal myocardial perfusion study in the past as recently as November 2017, but a subsequent cardiac catheterization was normal. Otherwise, there have been no changes to her past medical history, surgical history, family history, or social history.    Review of Systems: a 14-point review of  systems is pertinent for what is mentioned in HPI.  Otherwise, all other systems were negative. Constitutional: negative other than that listed in the HPI Eyes: negative other than that listed in the HPI Ears, nose, mouth, throat, and face: negative other than that listed in the HPI Respiratory: negative other than that listed in the HPI Cardiovascular: negative other than that listed in the HPI Gastrointestinal: negative other than that listed in the HPI Genitourinary: negative other than that listed in the HPI Integument: negative other  than that listed in the HPI Hematologic: negative other than that listed in the HPI Musculoskeletal: negative other than that listed in the HPI Neurological: negative other than that listed in the HPI Allergy/Immunologic: negative other than that listed in the HPI    Objective:   Blood pressure 120/70, pulse 60, temperature 97.7 F (36.5 C), temperature source Oral, resp. rate 16, SpO2 97 %. There is no height or weight on file to calculate BMI.   Physical Exam:  General: Alert, interactive, in no acute distress. Pleasant female. Talkative.  Eyes: No conjunctival injection present on the right, No conjunctival injection present on the left, PERRL bilaterally, No discharge on the right, No discharge on the left, No Horner-Trantas dots present and allergic shiners present bilaterally Ears: Right TM pearly gray with normal light reflex, Left TM pearly gray with normal light reflex, Right TM intact without perforation and Left TM intact without perforation.  Nose/Throat: External nose within normal limits, nasal crease present and septum midline, turbinates edematous with clear discharge, post-pharynx erythematous without cobblestoning in the posterior oropharynx. Tonsils 2+ without exudates Neck: Supple without thyromegaly. Lungs: Clear to auscultation without wheezing, rhonchi or rales. No increased work of breathing. CV: Normal S1/S2, no murmurs. Capillary refill <2 seconds.  Skin: Warm and dry, without lesions or rashes. Neuro:   Grossly intact. No focal deficits appreciated. Responsive to questions.   Diagnostic studies: none    Salvatore Marvel, MD [place] [facility] [place]

## 2017-01-02 NOTE — Patient Instructions (Addendum)
1. Perennial allergic rhinitis - I would recommend that you use nasal sprays on a daily basis: nasal saline rinses twice daily + Flonase two sprays per nostril twice daily + Patanase two sprays per nostril 1-2 times daily - Continue with montelukast 10mg  daily. - Add Xyzal 5mg  daily.  - These medications need to be used every day for the best effect. - We will give you another course of antibiotics to see if this helps. - If you continue to have problems, we will need to get a sinus CT.   2. GERD (reflux) - Stop the omeprazole and start Dexilant 30mg  daily. - Prescription sent in.   3. Return in about 3 months (around 04/04/2017).  Please inform us of any Emergency Department visits, hospitalizations, or changes in symptoms. Call us before going to the ED for breathing or allergy symptoms since we might be able to fit you in for a sick visit. Feel free to contact us anytime with any questions, problems, or concerns.  It was a pleasure to see you again today! Happy spring! I hope you enjoy the snow tomorrow!   Websites that have reliable patient information: 1. American Academy of Asthma, Allergy, and Immunology: www.aaaai.org 2. Food Allergy Research and Education (FARE): foodallergy.org 3. Mothers of Asthmatics: http://www.asthmacommunitynetwork.org 4. American College of Allergy, Asthma, and Immunology: www.acaai.org

## 2017-01-04 DIAGNOSIS — M778 Other enthesopathies, not elsewhere classified: Secondary | ICD-10-CM | POA: Diagnosis not present

## 2017-01-04 DIAGNOSIS — G5602 Carpal tunnel syndrome, left upper limb: Secondary | ICD-10-CM | POA: Diagnosis not present

## 2017-01-04 DIAGNOSIS — Z1231 Encounter for screening mammogram for malignant neoplasm of breast: Secondary | ICD-10-CM | POA: Diagnosis not present

## 2017-01-10 ENCOUNTER — Other Ambulatory Visit: Payer: Self-pay | Admitting: Allergy & Immunology

## 2017-01-10 NOTE — Telephone Encounter (Signed)
Called patient and informed her that we sent in the script for dexilant 30mg  on  01/02/17 with 3 refills. Patient stated she did not realize that is was sent in, if she has anymore questions she will give [place] a call.

## 2017-01-10 NOTE — Telephone Encounter (Signed)
Patient is calling for a refill on her acid reflux medication Patient states that GALLAGHEr gave her a new medication but when it was called in there were only 5 pills in the bottle Patient takes medication daily Patient needs a refill on this medication called in to [facility] in [place] Patient would like a phone call back about how many refills are called in and when she can pick them up

## 2017-01-11 DIAGNOSIS — L600 Ingrowing nail: Secondary | ICD-10-CM | POA: Diagnosis not present

## 2017-02-02 DIAGNOSIS — R251 Tremor, unspecified: Secondary | ICD-10-CM | POA: Diagnosis not present

## 2017-02-02 DIAGNOSIS — R269 Unspecified abnormalities of gait and mobility: Secondary | ICD-10-CM | POA: Diagnosis not present

## 2017-02-14 DIAGNOSIS — M67432 Ganglion, left wrist: Secondary | ICD-10-CM | POA: Diagnosis not present

## 2017-02-14 DIAGNOSIS — I10 Essential (primary) hypertension: Secondary | ICD-10-CM | POA: Diagnosis not present

## 2017-03-21 ENCOUNTER — Telehealth: Payer: Self-pay | Admitting: Allergy & Immunology

## 2017-03-21 NOTE — Telephone Encounter (Signed)
Patient stated that mailed 2 payments out last week. I for [employer] acct # [id number] for $9.16, the other for EPIC account for $7.80. She asked to have a letter statement mailed to her with the amounts owed on each of these accounts after those payments are or were applied. Thanks

## 2017-03-28 NOTE — Telephone Encounter (Signed)
I talked to Barbara House this morning and told we did receive her payments of $9.16 for account [id number] and $7.80 on EPIC.   I gave her the new balances and told her a statement would be mailed out.

## 2017-03-29 DIAGNOSIS — N898 Other specified noninflammatory disorders of vagina: Secondary | ICD-10-CM | POA: Diagnosis not present

## 2017-04-03 ENCOUNTER — Encounter: Payer: Self-pay | Admitting: Allergy & Immunology

## 2017-04-03 ENCOUNTER — Ambulatory Visit (INDEPENDENT_AMBULATORY_CARE_PROVIDER_SITE_OTHER): Payer: Medicare Other | Admitting: Allergy & Immunology

## 2017-04-03 VITALS — BP 126/72 | HR 63 | Temp 97.6°F | Resp 20 | Ht 61.0 in | Wt 196.0 lb

## 2017-04-03 DIAGNOSIS — J3089 Other allergic rhinitis: Secondary | ICD-10-CM

## 2017-04-03 DIAGNOSIS — K219 Gastro-esophageal reflux disease without esophagitis: Secondary | ICD-10-CM | POA: Diagnosis not present

## 2017-04-03 DIAGNOSIS — J329 Chronic sinusitis, unspecified: Secondary | ICD-10-CM

## 2017-04-03 NOTE — Patient Instructions (Addendum)
1. Perennial allergic rhinitis (molds) - Continue with recommend that you use nasal sprays on a daily basis: nasal saline rinses twice daily + Flonase two sprays per nostril twice daily + Patanase two sprays per nostril 1-2 times daily - Continue with montelukast 10mg  daily. - Continue with Xyzal 5mg  daily.  - These medications need to be used every day for the best effect. - Use nasal saline rinses 1-2 times daily.  - Restart Mucinex one tablet twice daily to help with mucous drainage.  - We will get environmental allergy testing to see if there is any evidence of environmental allergies.  - We will try to get the records of the sinus CT from [facility]  - Once we have this information, we can refer you to see an [place] physician.   2. GERD (reflux) - Continue with Dexilant 30mg  daily.  3. Itching with rash  - You can take an extra Xyzal (levocetirizine) as needed for the itching and rash.  - We will send in hydrocortisone 2.5% ointment as needed for the rash (safe to use on the face).   4. It is important that you call to get a new Primary Care Provider: Dr. Freida Busman at [facility] [phone]).   5. Return in about 2 months (around 06/03/2017).  Please inform us of any Emergency Department visits, hospitalizations, or changes in symptoms. Call us before going to the ED for breathing or allergy symptoms since we might be able to fit you in for a sick visit. Feel free to contact us anytime with any questions, problems, or concerns.  It was a pleasure to see you again today! Have a wonderful summer!   Websites that have reliable patient information: 1. American Academy of Asthma, Allergy, and Immunology: www.aaaai.org 2. Food Allergy Research and Education (FARE): foodallergy.org 3. Mothers of Asthmatics: http://www.asthmacommunitynetwork.org 4. American College of Allergy, Asthma, and Immunology: www.acaai.org

## 2017-04-03 NOTE — Progress Notes (Signed)
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 Perennial allergic rhinitis (molds) - Continue with recommend that you use nasal sprays on a daily basis: nasal saline rinses twice daily + Flonase two sprays per nostril twice daily + Patanase two sprays per nostril 1-2 times daily - Continue with montelukast 10mg  daily. - Continue with Xyzal 5mg  daily.  - These medications need to be used every day for the best effect. - Use nasal saline rinses 1-2 times daily.  - Restart Mucinex one tablet twice daily to help with mucous drainage.  - She would benefit from allergy shots, however review of her last testing in August 2016 showed only mild reactivity to a minor mold mix. - We will get environmental allergy testing to see if there is any evidence of environmental allergies.  - We will try to get the records of the sinus CT from [facility].  - I'm still not convinced that she had a sinus CT performed, as her history is quite awful. - Once we have this information, we can refer you to see an [facility] physician.  - She does have a history of migraines, therefore this could be a manifestation of this as well.  - We are at a point that we need some outside records before proceeding with further management.   2. GERD (reflux) - Continue with Dexilant 30mg  daily.  3. Itching with rash  - You can take an extra Xyzal (levocetirizine) as needed for the itching and rash.  - We will send in hydrocortisone 2.5% ointment as needed for the rash (safe to use on the face).   4. Complex medical history including diabetes mellitus and migraines  5. It is important that you call to get a new Primary Care Provider: Dr. Freida Busman at [facility] [phone]).   6. Return in about  2 months (around 06/03/2017).   Subjective:   Barbara House is a 61 y.o. female presenting today for follow up of  Chief Complaint  Patient presents with  . Allergic Rhinitis     Barbara House has a history of the following: Patient Active Problem List   Diagnosis Date Noted  . Angina pectoris ([place]) 08/03/2016  . Abnormal nuclear stress test 08/03/2016  . Abnormal myocardial perfusion study   . Rhinitis 06/25/2015  . GERD (gastroesophageal reflux disease) 06/25/2015  . Diabetes mellitus ([place]) 06/25/2015    History obtained from: chart review and patient. Barbara House was followed by Dr. Wenda Overland, who was killed in a motorcycle accident. Apparently, there are still working on getting another doctor to replace him in the practice.   Barbara House was referred by Patient, No Pcp Per.     Barbara House is a 61 y.o. female presenting for a follow up visit. She was last seen in March 2018. At that time, her allergies were not under good control. I recommended using nasal saline rinses twice daily in conjunction with Flonase 2 sprays per nostril twice daily and Patanase 2 sprays per nostril 1-2 times daily. We continued her on montelukast 10 mg daily and added Xyzal 5 mg daily. We did send in a second course of antibiotics for her sinusitis. We changed her from omeprazole to Dexilant 30 mg daily for her reflux.  Since the last  visit, she has mostly done well. Her history is rather vague and all over the place. She is still on her nasal sprays, but it seems that she is not using them daily as recommended. She will only start using them when she starts to feel the drainage. At the last visit, she did get another course of antibiotics. She thinks that it did help, but it is rather difficult to tell. She has a very tangential thought process, which makes her history rather hard to follow. It does seem that she is still on the Singulair as well as the Xyzal.  She does endorse continued sinus pain and  pressure. She tells me that she went to see her dentist, where she was diagnosed with sinusitis. It is unclear whether she was treated with another course of antibiotics. She reports constant throat drainage and postnasal drip. She has a very hoarse quality to her voice. She does feel that her reflux is under good control with the [place]. She does endorse some shortness of breath when her nose is particularly congested. She has never been diagnosed with asthma and has never needed an inhaler. She has never been diagnosed with asthma and has never needed an inhaler.   Today, she is also endorsing itching with a rash. This is not visible at all today. She has not tried treating it with anything. This seems to come out when she is rather stressed. She has been followed by Dr. Denna Haggard in the past, we did provide ointments that seemed to help. Unsurprisingly, she is unable to remember the name of this medication. She has had no systemic symptoms with this rash. She has no pictures of the rash today.  Otherwise, there have been no changes to her past medical history, surgical history, family history, or social history.    Review of Systems: a 14-point review of systems is pertinent for what is mentioned in HPI.  Otherwise, all other systems were negative. Constitutional: negative other than that listed in the HPI Eyes: negative other than that listed in the HPI Ears, nose, mouth, throat, and face: negative other than that listed in the HPI Respiratory: negative other than that listed in the HPI Cardiovascular: negative other than that listed in the HPI Gastrointestinal: negative other than that listed in the HPI Genitourinary: negative other than that listed in the HPI Integument: negative other than that listed in the HPI Hematologic: negative other than that listed in the HPI Musculoskeletal: negative other than that listed in the HPI Neurological: negative other than that listed in the  HPI Allergy/Immunologic: negative other than that listed in the HPI    Objective:   Blood pressure 126/72, pulse 63, temperature 97.6 F (36.4 C), resp. rate 20, height 5\' 1"  (1.549 m), weight 196 lb (88.9 kg), SpO2 97 %. Body mass index is 37.03 kg/m.   Physical Exam:  General: Alert, interactive, in no acute distress. Yelling during the visit at times. Hoarse voice.  Eyes: No conjunctival injection present on the right, No conjunctival injection present on the left, PERRL bilaterally, No discharge on the right, No discharge on the left and No Horner-Trantas dots present Ears: Right TM pearly gray with normal light reflex, Left TM pearly gray with normal light reflex, Right TM intact without perforation and Left TM intact without perforation.  Nose/Throat: External nose within normal limits, nasal crease present and septum midline, turbinates edematous and pale without discharge, post-pharynx erythematous without cobblestoning in the posterior oropharynx. Tonsils 2+ without exudates.  Bilateral maxillary sinus tenderness present.  Neck: Supple without thyromegaly. Lungs: Clear to auscultation without wheezing, rhonchi or rales. No increased work of breathing. CV: Normal S1/S2, no murmurs. Capillary refill <2 seconds.  Skin: Warm and dry, without lesions or rashes. Neuro:   Grossly intact. No focal deficits appreciated. Responsive to questions.   Diagnostic studies: none     Salvatore Marvel, MD [place] [facility] [place]

## 2017-04-09 DIAGNOSIS — J3089 Other allergic rhinitis: Secondary | ICD-10-CM | POA: Diagnosis not present

## 2017-04-10 DIAGNOSIS — L259 Unspecified contact dermatitis, unspecified cause: Secondary | ICD-10-CM | POA: Diagnosis not present

## 2017-04-10 DIAGNOSIS — E1149 Type 2 diabetes mellitus with other diabetic neurological complication: Secondary | ICD-10-CM | POA: Diagnosis not present

## 2017-04-10 DIAGNOSIS — L280 Lichen simplex chronicus: Secondary | ICD-10-CM | POA: Diagnosis not present

## 2017-04-10 DIAGNOSIS — B351 Tinea unguium: Secondary | ICD-10-CM | POA: Diagnosis not present

## 2017-04-10 DIAGNOSIS — L600 Ingrowing nail: Secondary | ICD-10-CM | POA: Diagnosis not present

## 2017-04-10 DIAGNOSIS — M79674 Pain in right toe(s): Secondary | ICD-10-CM | POA: Diagnosis not present

## 2017-04-10 DIAGNOSIS — M79675 Pain in left toe(s): Secondary | ICD-10-CM | POA: Diagnosis not present

## 2017-04-10 DIAGNOSIS — L602 Onychogryphosis: Secondary | ICD-10-CM | POA: Diagnosis not present

## 2017-04-12 ENCOUNTER — Telehealth: Payer: Self-pay

## 2017-04-12 DIAGNOSIS — J342 Deviated nasal septum: Secondary | ICD-10-CM

## 2017-04-12 NOTE — Telephone Encounter (Signed)
Patient is calling to see if we received the labs she did at [facility] in [place] on Monday.

## 2017-04-13 NOTE — Telephone Encounter (Signed)
Noted - thank you for keeping me in the loop. We can do skin testing at the next visit and discuss more at that time.   Salvatore Marvel, MD [place] [facility] [place]

## 2017-04-13 NOTE — Addendum Note (Signed)
Addended by: Valentina Shaggy on: 04/13/2017 01:46 PM   Modules accepted: Orders

## 2017-04-13 NOTE — Telephone Encounter (Signed)
Called patient. I informed her that the blood work came back negative. I asked her if she wanted to do a skin at her next visit; I don't believe she understood. I told her that we can discuss it at her next visit. I also informed patient that we sent an order for Sinus CT Scan at [facility]. I asked her to go and get the scan done at her earliest convenience.

## 2017-04-13 NOTE — Telephone Encounter (Signed)
Tried calling patient to give lab results and I got hung up on. I called the number [phone].

## 2017-04-13 NOTE — Telephone Encounter (Signed)
[facility] in [place], I spoke with the lab about results for zone 3. She is faxing over the results. When I receive the fax I will send over to Dr. Ernst Bowler in [facility] to review. Once Dr. Ernst Bowler reviews we will then call the patient with the results.

## 2017-04-13 NOTE — Telephone Encounter (Signed)
I sent fax over to [facility] for Dr. Ernst Bowler to review.

## 2017-04-13 NOTE — Telephone Encounter (Signed)
Environmental allergy testing was completely negative, however it should be noted that the blood panel contains significantly fewer allergens than our skin testing panel. Therefore, please give Ms. Brach the option of doing skin testing at her next visit with [place].   Please check and see if we have gotten any sinus CT results from [facility] as well.   Thanks, Salvatore Marvel, MD [place] [facility] [place]

## 2017-04-19 ENCOUNTER — Other Ambulatory Visit: Payer: Self-pay | Admitting: Allergy & Immunology

## 2017-04-20 DIAGNOSIS — I10 Essential (primary) hypertension: Secondary | ICD-10-CM | POA: Diagnosis not present

## 2017-04-20 DIAGNOSIS — E119 Type 2 diabetes mellitus without complications: Secondary | ICD-10-CM | POA: Diagnosis not present

## 2017-05-01 DIAGNOSIS — M5030 Other cervical disc degeneration, unspecified cervical region: Secondary | ICD-10-CM | POA: Diagnosis not present

## 2017-05-02 ENCOUNTER — Ambulatory Visit (HOSPITAL_COMMUNITY)
Admission: RE | Admit: 2017-05-02 | Discharge: 2017-05-02 | Disposition: A | Discharge status: Home / Self Care | Payer: Medicare Other | Source: Ambulatory Visit | Attending: Allergy & Immunology | Admitting: Allergy & Immunology

## 2017-05-02 DIAGNOSIS — J342 Deviated nasal septum: Secondary | ICD-10-CM | POA: Insufficient documentation

## 2017-05-02 DIAGNOSIS — J3089 Other allergic rhinitis: Secondary | ICD-10-CM | POA: Insufficient documentation

## 2017-05-02 DIAGNOSIS — J329 Chronic sinusitis, unspecified: Secondary | ICD-10-CM | POA: Diagnosis not present

## 2017-05-02 DIAGNOSIS — R05 Cough: Secondary | ICD-10-CM | POA: Diagnosis not present

## 2017-05-02 NOTE — Telephone Encounter (Signed)
[facility] called patient needs to be scheduled through central booking [phone]. Also she might need precert. Patient is at the center now advised she would have to come back since we cannot work on it at the moment.

## 2017-05-03 DIAGNOSIS — E089 Diabetes mellitus due to underlying condition without complications: Secondary | ICD-10-CM | POA: Diagnosis not present

## 2017-05-03 DIAGNOSIS — E119 Type 2 diabetes mellitus without complications: Secondary | ICD-10-CM | POA: Diagnosis present

## 2017-05-03 DIAGNOSIS — Z90710 Acquired absence of both cervix and uterus: Secondary | ICD-10-CM | POA: Diagnosis not present

## 2017-05-03 DIAGNOSIS — I9589 Other hypotension: Secondary | ICD-10-CM | POA: Diagnosis not present

## 2017-05-03 DIAGNOSIS — R270 Ataxia, unspecified: Secondary | ICD-10-CM | POA: Diagnosis not present

## 2017-05-03 DIAGNOSIS — N281 Cyst of kidney, acquired: Secondary | ICD-10-CM | POA: Diagnosis not present

## 2017-05-03 DIAGNOSIS — R079 Chest pain, unspecified: Secondary | ICD-10-CM | POA: Diagnosis not present

## 2017-05-03 DIAGNOSIS — R0789 Other chest pain: Secondary | ICD-10-CM | POA: Diagnosis not present

## 2017-05-03 DIAGNOSIS — R1011 Right upper quadrant pain: Secondary | ICD-10-CM | POA: Diagnosis not present

## 2017-05-03 DIAGNOSIS — Z7984 Long term (current) use of oral hypoglycemic drugs: Secondary | ICD-10-CM | POA: Diagnosis not present

## 2017-05-03 DIAGNOSIS — H8110 Benign paroxysmal vertigo, unspecified ear: Secondary | ICD-10-CM | POA: Diagnosis present

## 2017-05-03 DIAGNOSIS — R42 Dizziness and giddiness: Secondary | ICD-10-CM | POA: Diagnosis not present

## 2017-05-03 DIAGNOSIS — E860 Dehydration: Secondary | ICD-10-CM | POA: Diagnosis present

## 2017-05-03 DIAGNOSIS — R29818 Other symptoms and signs involving the nervous system: Secondary | ICD-10-CM | POA: Diagnosis not present

## 2017-05-03 DIAGNOSIS — F329 Major depressive disorder, single episode, unspecified: Secondary | ICD-10-CM | POA: Diagnosis present

## 2017-05-03 DIAGNOSIS — I10 Essential (primary) hypertension: Secondary | ICD-10-CM | POA: Diagnosis present

## 2017-05-03 DIAGNOSIS — J329 Chronic sinusitis, unspecified: Secondary | ICD-10-CM | POA: Diagnosis not present

## 2017-05-03 DIAGNOSIS — R112 Nausea with vomiting, unspecified: Secondary | ICD-10-CM | POA: Diagnosis present

## 2017-05-04 DIAGNOSIS — F329 Major depressive disorder, single episode, unspecified: Secondary | ICD-10-CM | POA: Diagnosis not present

## 2017-05-04 DIAGNOSIS — H8110 Benign paroxysmal vertigo, unspecified ear: Secondary | ICD-10-CM | POA: Diagnosis not present

## 2017-05-04 DIAGNOSIS — I9589 Other hypotension: Secondary | ICD-10-CM | POA: Diagnosis not present

## 2017-05-04 DIAGNOSIS — E119 Type 2 diabetes mellitus without complications: Secondary | ICD-10-CM | POA: Diagnosis not present

## 2017-05-04 DIAGNOSIS — R112 Nausea with vomiting, unspecified: Secondary | ICD-10-CM | POA: Diagnosis not present

## 2017-05-04 DIAGNOSIS — I10 Essential (primary) hypertension: Secondary | ICD-10-CM | POA: Diagnosis not present

## 2017-05-04 DIAGNOSIS — E860 Dehydration: Secondary | ICD-10-CM | POA: Diagnosis not present

## 2017-05-04 DIAGNOSIS — R42 Dizziness and giddiness: Secondary | ICD-10-CM | POA: Diagnosis not present

## 2017-05-07 NOTE — Telephone Encounter (Signed)
Patient called requesting CT scan Results. Dr Ernst Bowler please call patient

## 2017-05-07 NOTE — Telephone Encounter (Signed)
Patient is aware when writer spoke with her earlier she is aware Dr Ernst Bowler will call her on 05/07/17

## 2017-05-07 NOTE — Telephone Encounter (Signed)
I called her on Friday and she was not home. I'll give her a call when I cam back in the office tomorrow.  Salvatore Marvel, MD [place] [facility] [place]

## 2017-05-08 NOTE — Telephone Encounter (Signed)
I called Ms. Townsel to let her know that the sinus CT showed evidence of a left septal deviation. This correlates with her symptoms, which always tend to be left-sided. She was recently in the hospital for what sounds like a GI illness, but she is slowly making a recovery. She would like to see an ENT provider, so we will refer her to see Dr. Benjamine Mola here in [place]. She is also scheduled to establish care with Dr. Meda Coffee on August 3rd here in [place].  Salvatore Marvel, MD [facility] [place]

## 2017-05-08 NOTE — Addendum Note (Signed)
Addended by: Valentina Shaggy on: 05/08/2017 04:43 PM   Modules accepted: Orders

## 2017-05-09 NOTE — Telephone Encounter (Signed)
Referral Faxed to Dr. Velvet Bathe office.  Thanks

## 2017-05-09 NOTE — Telephone Encounter (Signed)
Noted. Thank you, Karena Addison!   Salvatore Marvel, MD [place] [facility] [place]

## 2017-05-18 ENCOUNTER — Ambulatory Visit: Payer: Medicare Other | Admitting: Family Medicine

## 2017-05-21 DIAGNOSIS — M5030 Other cervical disc degeneration, unspecified cervical region: Secondary | ICD-10-CM | POA: Diagnosis not present

## 2017-05-21 DIAGNOSIS — M542 Cervicalgia: Secondary | ICD-10-CM | POA: Diagnosis not present

## 2017-05-23 DIAGNOSIS — B354 Tinea corporis: Secondary | ICD-10-CM | POA: Diagnosis not present

## 2017-05-23 DIAGNOSIS — L309 Dermatitis, unspecified: Secondary | ICD-10-CM | POA: Diagnosis not present

## 2017-05-24 ENCOUNTER — Telehealth: Payer: Self-pay | Admitting: Allergy & Immunology

## 2017-05-24 NOTE — Telephone Encounter (Signed)
Please call pt back regarding MM account [id number] and concerns about her payment history. Thanks

## 2017-05-25 DIAGNOSIS — M5030 Other cervical disc degeneration, unspecified cervical region: Secondary | ICD-10-CM | POA: Diagnosis not present

## 2017-05-25 DIAGNOSIS — M542 Cervicalgia: Secondary | ICD-10-CM | POA: Diagnosis not present

## 2017-05-25 NOTE — Telephone Encounter (Signed)
Went over balance & pmts on MM & Epic - kt

## 2017-05-29 ENCOUNTER — Telehealth: Payer: Self-pay

## 2017-05-29 NOTE — Telephone Encounter (Signed)
Patient called and stated that she was in the hospital and that she is still feeling weak and dizzy. I told her to call her PCP and would be able to help her.

## 2017-05-30 DIAGNOSIS — M542 Cervicalgia: Secondary | ICD-10-CM | POA: Diagnosis not present

## 2017-05-30 DIAGNOSIS — M5030 Other cervical disc degeneration, unspecified cervical region: Secondary | ICD-10-CM | POA: Diagnosis not present

## 2017-06-01 DIAGNOSIS — M5030 Other cervical disc degeneration, unspecified cervical region: Secondary | ICD-10-CM | POA: Diagnosis not present

## 2017-06-01 DIAGNOSIS — M542 Cervicalgia: Secondary | ICD-10-CM | POA: Diagnosis not present

## 2017-06-06 DIAGNOSIS — M5030 Other cervical disc degeneration, unspecified cervical region: Secondary | ICD-10-CM | POA: Diagnosis not present

## 2017-06-06 DIAGNOSIS — M542 Cervicalgia: Secondary | ICD-10-CM | POA: Diagnosis not present

## 2017-06-08 DIAGNOSIS — M542 Cervicalgia: Secondary | ICD-10-CM | POA: Diagnosis not present

## 2017-06-08 DIAGNOSIS — M5030 Other cervical disc degeneration, unspecified cervical region: Secondary | ICD-10-CM | POA: Diagnosis not present

## 2017-06-12 DIAGNOSIS — M542 Cervicalgia: Secondary | ICD-10-CM | POA: Diagnosis not present

## 2017-06-12 DIAGNOSIS — M5030 Other cervical disc degeneration, unspecified cervical region: Secondary | ICD-10-CM | POA: Diagnosis not present

## 2017-06-13 ENCOUNTER — Ambulatory Visit (INDEPENDENT_AMBULATORY_CARE_PROVIDER_SITE_OTHER): Payer: Medicare Other | Admitting: Family Medicine

## 2017-06-13 ENCOUNTER — Encounter: Payer: Self-pay | Admitting: Family Medicine

## 2017-06-13 VITALS — BP 136/88 | HR 82 | Temp 97.7°F | Resp 16 | Ht 62.75 in | Wt 196.2 lb

## 2017-06-13 DIAGNOSIS — F419 Anxiety disorder, unspecified: Secondary | ICD-10-CM | POA: Diagnosis not present

## 2017-06-13 DIAGNOSIS — I10 Essential (primary) hypertension: Secondary | ICD-10-CM | POA: Diagnosis not present

## 2017-06-13 DIAGNOSIS — J309 Allergic rhinitis, unspecified: Secondary | ICD-10-CM | POA: Insufficient documentation

## 2017-06-13 DIAGNOSIS — E119 Type 2 diabetes mellitus without complications: Secondary | ICD-10-CM | POA: Diagnosis not present

## 2017-06-13 DIAGNOSIS — M5030 Other cervical disc degeneration, unspecified cervical region: Secondary | ICD-10-CM | POA: Diagnosis not present

## 2017-06-13 DIAGNOSIS — M542 Cervicalgia: Secondary | ICD-10-CM | POA: Diagnosis not present

## 2017-06-13 DIAGNOSIS — J302 Other seasonal allergic rhinitis: Secondary | ICD-10-CM | POA: Diagnosis not present

## 2017-06-13 NOTE — Progress Notes (Signed)
Subjective:    Patient ID: Barbara House, female    DOB: [birth year], 61 y.o.   MRN: [id number]  Here to establish care. Reports that has been doing well. Does not want a flu shot today. Believe that she has had a pneumonia shot in the past. Reports that is doing well. Is taking medications as directed. Mood has been good. Taking her medications as directed. Is not sure of all medications and did not bring them to office visit today. Energy is good. 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 Reports is on disability. Patient reports seeing a cardiologist, ENT, doctor for her mood, gastroenterologist, and several other providers. Would like to consolidate some of her care.    Diabetes  She presents for her initial diabetic visit. She has type 2 diabetes mellitus. No MedicAlert identification noted. Her disease course has been fluctuating. Pertinent negatives for hypoglycemia include no confusion, dizziness, headaches, hunger, seizures, sleepiness, speech difficulty, sweats or tremors. Pertinent negatives for diabetes include no blurred vision, no fatigue, no foot ulcerations, no polydipsia, no polyphagia, no polyuria, no visual change, no weakness and no weight loss. (Reports that does not have diabetic retinopathy but wears glasses for vision correction. ) Pertinent negatives for hypoglycemia complications include no blackouts, no hospitalization (Had a recent cardiac catherterization which was normal. ) and no nocturnal hypoglycemia. There are no diabetic complications. Pertinent negatives for diabetic complications include no CVA or heart disease. Risk factors for coronary artery disease include diabetes mellitus, dyslipidemia, family history, obesity, hypertension, stress, sedentary lifestyle and post-menopausal. Current diabetic treatment includes diet and oral agent (dual therapy). She is compliant with treatment most of the time. Her weight is stable.  She is following a diabetic (Understands the foods/drinks that elevate sugar. ) diet. Meal planning includes avoidance of concentrated sweets. She participates in exercise intermittently. Her overall blood glucose range is 90-110 mg/dl. An ACE inhibitor/angiotensin II receptor blocker is being taken. She does not see a podiatrist.Eye exam is current.  Hypertension  This is a chronic problem. The current episode started more than 1 year ago. The problem has been waxing and waning since onset. The problem is controlled (per patient when she takes her medicaiton). Pertinent negatives include no blurred vision, headaches or sweats. There are no associated agents to hypertension. Risk factors for coronary artery disease include diabetes mellitus, dyslipidemia, family history, post-menopausal state, obesity, stress and sedentary lifestyle. Past treatments include ACE inhibitors, calcium channel blockers and lifestyle changes. Compliance problems: reports that does not always take medication but tries to take them.   There is no history of CVA. There is no history of chronic renal disease or a thyroid problem.    Social History   Social History  . Marital status: Single    Spouse name: N/A  . Number of children: N/A  . Years of education: N/A   Occupational History  . Not on file.   Social History Main Topics  . Smoking status: Never Smoker  . Smokeless tobacco: Never Used  . Alcohol use No  . Drug use: No  . Sexual activity: Not on file   Other Topics Concern  . Not on file   Social History Narrative   Reports is on disability. Has one son. Lives in [place]. Enjoys time with family. Enjoys Firefighter. Attends church.     There is no immunization history on file for this patient. Past Medical History:  Diagnosis  Date  . Allergic rhinitis   . Anxiety disorder   . GERD (gastroesophageal reflux disease)   . History of migraine   . Hypertension   . Type 2 diabetes mellitus ([place])    Not  sure when she was diagnosed, greater than five years.      Review of Systems  Constitutional: Negative for activity change, appetite change, chills, diaphoresis, fatigue, fever, unexpected weight change and weight loss.  Eyes: Negative for blurred vision.  Endocrine: Negative for polydipsia, polyphagia and polyuria.  Neurological: Negative for dizziness, tremors, seizures, speech difficulty, weakness and headaches.  Hematological: Negative for adenopathy. Does not bruise/bleed easily.  Psychiatric/Behavioral: Negative for confusion.       Objective:   Physical Exam  Constitutional: She is oriented to person, place, and time. She appears well-developed and well-nourished.  HENT:  Head: Normocephalic and atraumatic.  Mouth/Throat: No oropharyngeal exudate.  Eyes: Pupils are equal, round, and reactive to light. Conjunctivae and EOM are normal.  Neck: Normal range of motion. Neck supple. No JVD present. No tracheal deviation present. No thyromegaly present.  Cardiovascular: Normal rate, regular rhythm and normal heart sounds.   Pulmonary/Chest: Effort normal and breath sounds normal. She has no wheezes. She has no rales.  Abdominal: Soft. Bowel sounds are normal.  obese  Musculoskeletal: Normal range of motion.  Neurological: She is alert and oriented to person, place, and time. No cranial nerve deficit. Coordination normal.  Skin: Skin is warm and dry. No erythema.  Psychiatric: Her behavior is normal. Thought content normal.  No suicidal ideations.   Vitals reviewed.  Vitals:   06/13/17 0906  BP: 136/88  Pulse: 82  Resp: 16  Temp: 97.7 F (36.5 C)  SpO2: 99%          Assessment & Plan:  Records request from previous doctor, Dr. Wenda Overland.  Request eye doctor note, patient to bring information in to next visit. Seen in [place], [place].  Reports that has not taken BP medication today b/c she has not eaten. Patient reports that will take BP medication next time before  coming.  1. Essential hypertension, not at goal Continue current medications. Recheck in one month. Compliance discussed with patient.  Diet, exercise recommended.   2. Seasonal allergic rhinitis, unspecified trigger Continue medications.   3. Type 2 diabetes mellitus without complication, without long-term current use of insulin (HCC)  - HgB A1c - Urine Microalbumin w/creat. ratio - Basic Metabolic Panel (BMET) - Lipid Profile, reports that she does not know when it was last checked. Has not been on any medication recently.  -Is going to get records for eye doctor or bring name so that we can request.  Defers foot exam today.   4. Anxiety disorder, unspecified type Continue current treatment. Asked patient about Artane and she is not sure if she is taking the medication. Will bring ALL medications to follow up office visit.  Call with questions, concerns, or changes in mood. Suicide precautions given.  Discussed strategies for stress reduction/anxiety management.  Sleep hygiene discussed.

## 2017-06-14 ENCOUNTER — Telehealth: Payer: Self-pay | Admitting: *Deleted

## 2017-06-14 ENCOUNTER — Encounter: Payer: Self-pay | Admitting: Family Medicine

## 2017-06-14 ENCOUNTER — Ambulatory Visit (INDEPENDENT_AMBULATORY_CARE_PROVIDER_SITE_OTHER): Payer: Medicare Other | Admitting: Otolaryngology

## 2017-06-14 DIAGNOSIS — J310 Chronic rhinitis: Secondary | ICD-10-CM

## 2017-06-14 DIAGNOSIS — J343 Hypertrophy of nasal turbinates: Secondary | ICD-10-CM | POA: Diagnosis not present

## 2017-06-14 DIAGNOSIS — J342 Deviated nasal septum: Secondary | ICD-10-CM | POA: Diagnosis not present

## 2017-06-14 LAB — BASIC METABOLIC PANEL
BUN: 13 mg/dL (ref 7–25)
CHLORIDE: 107 mmol/L (ref 98–110)
CO2: 25 mmol/L (ref 20–32)
CREATININE: 0.94 mg/dL (ref 0.50–1.05)
Calcium: 10.1 mg/dL (ref 8.6–10.4)
GLUCOSE: 155 mg/dL — AB (ref 65–99)
Potassium: 4.7 mmol/L (ref 3.5–5.3)
Sodium: 145 mmol/L (ref 135–146)

## 2017-06-14 LAB — LIPID PANEL
Cholesterol: 156 mg/dL (ref <200)
HDL: 60 mg/dL (ref >50)
LDL CALC: 86 mg/dL (ref <100)
TRIGLYCERIDES: 49 mg/dL (ref <150)
Total CHOL/HDL Ratio: 2.6 Ratio (ref <5.0)
VLDL: 10 mg/dL (ref <30)

## 2017-06-14 LAB — MICROALBUMIN / CREATININE URINE RATIO
Creatinine, Urine: 97 mg/dL (ref 20–320)
MICROALB UR: 1.3 mg/dL
Microalb Creat Ratio: 13 mcg/mg creat (ref <30)

## 2017-06-14 LAB — HEMOGLOBIN A1C
HEMOGLOBIN A1C: 7.3 % — AB (ref <5.7)
Mean Plasma Glucose: 163 mg/dL

## 2017-06-14 NOTE — Telephone Encounter (Signed)
Patient called left message for Dr Mannie Stabile stating she received her last pneumonia shot 05/29/13. Please call patient

## 2017-06-14 NOTE — Telephone Encounter (Signed)
Please call patient and see what she needs. Please update pneumonia shot in her chart. Thank you.

## 2017-06-15 DIAGNOSIS — M5030 Other cervical disc degeneration, unspecified cervical region: Secondary | ICD-10-CM | POA: Diagnosis not present

## 2017-06-15 DIAGNOSIS — M542 Cervicalgia: Secondary | ICD-10-CM | POA: Diagnosis not present

## 2017-06-19 ENCOUNTER — Ambulatory Visit: Payer: Medicare Other | Admitting: Allergy & Immunology

## 2017-06-20 DIAGNOSIS — M542 Cervicalgia: Secondary | ICD-10-CM | POA: Diagnosis not present

## 2017-06-20 DIAGNOSIS — M5030 Other cervical disc degeneration, unspecified cervical region: Secondary | ICD-10-CM | POA: Diagnosis not present

## 2017-06-27 DIAGNOSIS — M5030 Other cervical disc degeneration, unspecified cervical region: Secondary | ICD-10-CM | POA: Diagnosis not present

## 2017-06-27 DIAGNOSIS — M542 Cervicalgia: Secondary | ICD-10-CM | POA: Diagnosis not present

## 2017-06-29 DIAGNOSIS — M542 Cervicalgia: Secondary | ICD-10-CM | POA: Diagnosis not present

## 2017-06-29 DIAGNOSIS — M5030 Other cervical disc degeneration, unspecified cervical region: Secondary | ICD-10-CM | POA: Diagnosis not present

## 2017-07-02 DIAGNOSIS — M542 Cervicalgia: Secondary | ICD-10-CM | POA: Diagnosis not present

## 2017-07-02 DIAGNOSIS — M5030 Other cervical disc degeneration, unspecified cervical region: Secondary | ICD-10-CM | POA: Diagnosis not present

## 2017-07-04 NOTE — Progress Notes (Signed)
Patient ID: Barbara House, female    DOB: 06/24/1956, 61 y.o.   MRN: [id number]  Chief Complaint  Patient presents with  . Diabetes  . Hypertension    Allergies Prednisone  Subjective:   Barbara House is a 61 y.o. female who presents to [facility] today.  HPI Here for follow up. Reports has been doing well. Just completed PT for decreased range of motion in her neck. Reports that that knows the exercises to do for her neck. Does not take pain pills for neck.   Here to follow up for BP. It was elevated at the last visit. Patient reports that she has been under a lot of stress at home. Has to help take care of family and her extended family that lives next door. Takes BP medication each day as she should.   Taking Blood sugar medication. Has been trying to work on diet. Reports that does not have much time to take care of self b/c she is taking care of everyone else. Makes meals for aunt that lives next door.    Diabetes  She presents for her follow-up diabetic visit. She has type 2 diabetes mellitus. No MedicAlert identification noted. Her disease course has been fluctuating. Hypoglycemia symptoms include nervousness/anxiousness. Pertinent negatives for hypoglycemia include no confusion, dizziness, headaches, hunger, seizures or sweats. Pertinent negatives for diabetes include no blurred vision, no chest pain, no fatigue, no foot paresthesias, no foot ulcerations, no polydipsia, no polyphagia, no polyuria, no visual change, no weakness and no weight loss. There are no hypoglycemic complications. Diabetic symptom progression: has been fluctuating. There are no diabetic complications. Pertinent negatives for diabetic complications include no CVA or heart disease. Risk factors for coronary artery disease include diabetes mellitus, dyslipidemia, family history, hypertension, sedentary lifestyle and stress. Current diabetic treatment includes diet and oral agent (dual  therapy). She is compliant with treatment most of the time. Her weight is stable.  Hypertension  This is a chronic problem. The current episode started more than 1 year ago. The problem has been waxing and waning since onset. The problem is uncontrolled. Associated symptoms include anxiety. Pertinent negatives include no blurred vision, chest pain, headaches, orthopnea, palpitations, PND, shortness of breath or sweats. Agents associated with hypertension include NSAIDs. Past treatments include ACE inhibitors, calcium channel blockers and lifestyle changes. The current treatment provides mild improvement. Compliance problems include exercise.  There is no history of kidney disease, CAD/MI, CVA, heart failure or left ventricular hypertrophy.  Anxiety  Presents for follow-up visit. Symptoms include irritability, malaise, nervous/anxious behavior and restlessness. Patient reports no chest pain, confusion, decreased concentration, depressed mood, dizziness, muscle tension, nausea, palpitations, shortness of breath or suicidal ideas. Symptoms occur most days. The severity of symptoms is moderate. The quality of sleep is good. Nighttime awakenings: occasional.   Compliance with medications is 76-100%.    Past Medical History:  Diagnosis Date  . Allergic rhinitis   . Anxiety disorder   . GERD (gastroesophageal reflux disease)   . History of migraine   . Hypertension   . Type 2 diabetes mellitus ([place])    Not sure when she was diagnosed, greater than five years.     Past Surgical History:  Procedure Laterality Date  . ABDOMINAL HYSTERECTOMY     has ovaries.   [name] CARDIAC CATHETERIZATION N/A 08/03/2016   Procedure: Left Heart Cath and Coronary Angiography;  Surgeon: Peter M Martinique, MD;  Location: [facility];  Service: Cardiovascular;  Laterality: N/A;  . Miinor skin surgery      Family History  Problem Relation Age of Onset  . Hypertension Mother   . Diabetes Mother   . Hypertension Son     . Diabetes Son   . Cancer Father   . Thyroid cancer Sister   . Cirrhosis Brother   . Alcohol abuse Brother      Social History   Social History  . Marital status: Single    Spouse name: N/A  . Number of children: N/A  . Years of education: N/A   Social History Main Topics  . Smoking status: Never Smoker  . Smokeless tobacco: Never Used  . Alcohol use No  . Drug use: No  . Sexual activity: Not Asked   Other Topics Concern  . None   Social History Narrative   Reports is on disability. Has one son. Lives in [place]. Enjoys time with family. Enjoys Firefighter. Attends church.     Review of Systems  Constitutional: Positive for irritability. Negative for fatigue and weight loss.  Eyes: Negative for blurred vision.  Respiratory: Negative for shortness of breath.   Cardiovascular: Negative for chest pain, palpitations, orthopnea and PND.  Gastrointestinal: Negative for nausea.  Endocrine: Negative for polydipsia, polyphagia and polyuria.  Neurological: Negative for dizziness, seizures, weakness and headaches.  Psychiatric/Behavioral: Negative for confusion, decreased concentration and suicidal ideas. The patient is nervous/anxious.      Objective:   BP (!) 146/82 (BP Location: Left Arm, Patient Position: Sitting, Cuff Size: Normal)   Pulse 87   Temp 98.7 F (37.1 C) (Other (Comment))   Resp 16   Ht 5\' 3"  (1.6 m)   Wt 198 lb 12 oz (90.2 kg)   SpO2 97%   BMI 35.21 kg/m   Physical Exam  Constitutional: She appears well-developed and well-nourished.  HENT:  Head: Normocephalic and atraumatic.  Eyes: Pupils are equal, round, and reactive to light. EOM are normal.  Neck: Normal range of motion. Neck supple. No JVD present. No tracheal deviation present. No thyromegaly present.  Cardiovascular: Normal rate, regular rhythm and normal heart sounds.   Pulmonary/Chest: Effort normal and breath sounds normal. No respiratory distress.  Lymphadenopathy:    She has no  cervical adenopathy.  Skin: Skin is warm and dry. No erythema. No pallor.  Psychiatric: Thought content normal. Her mood appears anxious. Her affect is labile. She is agitated. She is not actively hallucinating. Thought content is not delusional. She does not express impulsivity. She expresses no homicidal and no suicidal ideation. She expresses no suicidal plans and no homicidal plans.  Patient speaks in very loud voices and gets excited and stressed during office visit talking about all of responsibility in taking care of family. Mood is somewhat labile. Appropriate dress.  Thought processes seem to be normal. Suspect below normal IQ.  Thought processes tangential and gives details about life that are not relevant to encounter.   Vitals reviewed.    Assessment and Plan   1. Benign essential HTN, uncontrolled.  Increase medication as directed. Lifestyle modifications discussed with patient including a diet emphasizing vegetables, fruits, and whole grains. Limiting intake of sodium to less than 2,400 mg per day.  Recommendations discussed include consuming low-fat dairy products, poultry, fish, legumes, non-tropical vegetable oils, and nuts; and limiting intake of sweets, sugar-sweetened beverages, and red meat. Discussed following a plan such as the Dietary Approaches to Stop Hypertension (DASH) diet. Patient to read up on this diet.   -  lisinopril (PRINIVIL,ZESTRIL) 20 MG tablet; Take 2 pills each day as directed.  Dispense: 60 tablet; Refill: 0 Check BMP at follow up.  2. Anxiety, uncontrolled.  Anxiety control measures discussed. Patient defers referral to behavioral health.   - PARoxetine (PAXIL) 40 MG tablet; Take 0.5 tablets (20 mg total) by mouth every evening.  Dispense: 30 tablet; Refill: 1  3. Diabetes Mellitus type 2: patient will continue to watch diet and try to exercise. Weight loss encouraged.   Return in about 4 weeks (around 08/08/2017) for BP check. Caren Macadam,  MD 07/11/2017

## 2017-07-10 DIAGNOSIS — E1149 Type 2 diabetes mellitus with other diabetic neurological complication: Secondary | ICD-10-CM | POA: Diagnosis not present

## 2017-07-10 DIAGNOSIS — M79675 Pain in left toe(s): Secondary | ICD-10-CM | POA: Diagnosis not present

## 2017-07-10 DIAGNOSIS — M79674 Pain in right toe(s): Secondary | ICD-10-CM | POA: Diagnosis not present

## 2017-07-10 DIAGNOSIS — B351 Tinea unguium: Secondary | ICD-10-CM | POA: Diagnosis not present

## 2017-07-10 DIAGNOSIS — L602 Onychogryphosis: Secondary | ICD-10-CM | POA: Diagnosis not present

## 2017-07-10 DIAGNOSIS — L600 Ingrowing nail: Secondary | ICD-10-CM | POA: Diagnosis not present

## 2017-07-11 ENCOUNTER — Encounter: Payer: Self-pay | Admitting: Family Medicine

## 2017-07-11 ENCOUNTER — Ambulatory Visit (INDEPENDENT_AMBULATORY_CARE_PROVIDER_SITE_OTHER): Payer: Medicare Other | Admitting: Family Medicine

## 2017-07-11 VITALS — BP 146/82 | HR 87 | Temp 98.7°F | Resp 16 | Ht 63.0 in | Wt 198.8 lb

## 2017-07-11 DIAGNOSIS — E119 Type 2 diabetes mellitus without complications: Secondary | ICD-10-CM | POA: Diagnosis not present

## 2017-07-11 DIAGNOSIS — F419 Anxiety disorder, unspecified: Secondary | ICD-10-CM | POA: Diagnosis not present

## 2017-07-11 DIAGNOSIS — I10 Essential (primary) hypertension: Secondary | ICD-10-CM

## 2017-07-11 MED ORDER — PAROXETINE HCL 40 MG PO TABS: 20.0000 mg | ORAL_TABLET | Freq: Every evening | ORAL | 1 refills | Status: DC

## 2017-07-11 MED ORDER — LISINOPRIL 20 MG PO TABS: ORAL_TABLET | ORAL | 0 refills | Status: DC

## 2017-07-11 NOTE — Patient Instructions (Signed)

## 2017-07-12 ENCOUNTER — Telehealth: Payer: Self-pay | Admitting: Family Medicine

## 2017-07-12 NOTE — Telephone Encounter (Signed)
Patient also wants you to know about a pain she keeps having in the left side of her stomach, more than 1 year.

## 2017-07-12 NOTE — Telephone Encounter (Signed)
Patient is requesting a shot for an pneumonia and she is requesting Dr.Hagler to sign some papers for diabetic shoes. Cb#: [phone]  Does she need an appt for this or can she come for nurse visit ?

## 2017-07-12 NOTE — Telephone Encounter (Signed)
I can sign the paper for diabetic shoes, but she will need an appointment to discuss the pain that she has had in her abdomen for over a year. Please advise.

## 2017-07-12 NOTE — Telephone Encounter (Signed)
Please call patient to set up appt for stomach pain. Advise to drop off paper for shoes, Dr. Mannie Stabile will sign.

## 2017-08-01 ENCOUNTER — Ambulatory Visit: Payer: Medicare Other | Admitting: Family Medicine

## 2017-08-06 DIAGNOSIS — Z01419 Encounter for gynecological examination (general) (routine) without abnormal findings: Secondary | ICD-10-CM | POA: Diagnosis not present

## 2017-08-06 DIAGNOSIS — Z124 Encounter for screening for malignant neoplasm of cervix: Secondary | ICD-10-CM | POA: Diagnosis not present

## 2017-08-08 ENCOUNTER — Ambulatory Visit (INDEPENDENT_AMBULATORY_CARE_PROVIDER_SITE_OTHER): Payer: Medicare Other | Admitting: Family Medicine

## 2017-08-08 ENCOUNTER — Telehealth: Payer: Self-pay | Admitting: Family Medicine

## 2017-08-08 ENCOUNTER — Encounter: Payer: Self-pay | Admitting: Family Medicine

## 2017-08-08 VITALS — BP 130/78 | HR 82 | Temp 98.4°F | Resp 16 | Ht 63.0 in | Wt 194.5 lb

## 2017-08-08 DIAGNOSIS — R1012 Left upper quadrant pain: Secondary | ICD-10-CM

## 2017-08-08 DIAGNOSIS — N182 Chronic kidney disease, stage 2 (mild): Secondary | ICD-10-CM | POA: Diagnosis not present

## 2017-08-08 DIAGNOSIS — E1122 Type 2 diabetes mellitus with diabetic chronic kidney disease: Secondary | ICD-10-CM

## 2017-08-08 DIAGNOSIS — I10 Essential (primary) hypertension: Secondary | ICD-10-CM | POA: Diagnosis not present

## 2017-08-08 DIAGNOSIS — F419 Anxiety disorder, unspecified: Secondary | ICD-10-CM

## 2017-08-08 MED ORDER — UNABLE TO FIND: 0 refills | Status: DC

## 2017-08-08 MED ORDER — GLIPIZIDE ER 10 MG PO TB24: 10.0000 mg | ORAL_TABLET | Freq: Every day | ORAL | 1 refills | Status: DC

## 2017-08-08 NOTE — Telephone Encounter (Signed)
Advise patient that we will be rechecking House A1C in 2 months. Find out when House last colonoscopy was and request the records. When she is due will be determined by what the results of the last test were.  Barbara House. Barbara Stabile, MD

## 2017-08-08 NOTE — Progress Notes (Signed)
Patient ID: Barbara House, female    DOB: 12/25/1955, 61 y.o.   MRN: [id number]  Chief Complaint  Patient presents with  . Follow-up  . Diabetes  . Hypertension  . Anxiety    Allergies Prednisone  Subjective:   Barbara House is a 61 y.o. female who presents to [facility] today.  HPI Here for a follow up.   Has been on the increased dose of paxil for about a month. Feels less stressed. Reports that she is able to sit down and read and relax some without getting so worked up. Is able to deal with family stressors without getting so anxious and irritated. Has not had any side effects with the dose change. Reports that she does still get some things on her mind and she can focus on it to much, but this is better. Sleeping better. Energy and appetite is ok.   Has been taking the lisinopril one in the morning and one at night. Sometimes forgets to take the night dose.   Has been taking the blood sugar medications. Taking metformin, innvokana, and glipizide. Reports that she does not always take the evening glipizide b/c she forgets it. No problems with her blood sugars. No hypoglycemia. No lesions on her feet. Wants a script for diabetic shoes b/c has not gotten any new ones in 2 years. Does get some occasional burning feeling in her feet.   Reports that for 2-3 years that she has had pain in her left side. Pain bothers her on and off and it is tender to touch and move at times. Has discussed this with three doctors and she reports that she is always told that it is due to the fact that she has so much fat there. But, she reports that no one has checked it. She reports that the pain can come on at any time but bothers her a good bit of the day on certain days. No associated nausea or vomiting. BM are normal. Reports that she was told that it could also be b/c her stomach hangs down so much that it pulls and causes pain on her left side.    Diabetes  She presents for her  follow-up diabetic visit. She has type 2 diabetes mellitus. No MedicAlert identification noted. Her disease course has been fluctuating. Pertinent negatives for hypoglycemia include no headaches, pallor, sleepiness or tremors. Associated symptoms include foot paresthesias. Pertinent negatives for diabetes include no blurred vision, no chest pain, no fatigue, no foot ulcerations, no polydipsia, no polyphagia, no polyuria, no visual change, no weakness and no weight loss. There are no hypoglycemic complications. Symptoms are stable. Pertinent negatives for diabetic complications include no CVA, heart disease, nephropathy, PVD or retinopathy. Current diabetic treatment includes oral agent (triple therapy). She is compliant with treatment most of the time. Her weight is stable. She is following a generally healthy diet. When asked about meal planning, she reported none. She rarely participates in exercise. An ACE inhibitor/angiotensin II receptor blocker is being taken. She does not see a podiatrist.Eye exam is current.  Hypertension  This is a chronic problem. The current episode started more than 1 year ago. The problem has been waxing and waning since onset. The problem is controlled. Pertinent negatives include no anxiety, blurred vision, chest pain, headaches, palpitations, peripheral edema, PND or shortness of breath. There are no associated agents to hypertension. Risk factors for coronary artery disease include obesity, family history, post-menopausal state, stress and sedentary lifestyle.  Past treatments include ACE inhibitors. There is no history of angina, kidney disease, CAD/MI, CVA, heart failure, PVD or retinopathy.  Abdominal Pain  This is a chronic problem. The current episode started more than 1 year ago. The onset quality is sudden. The problem occurs intermittently. The problem has been unchanged. The pain is located in the LUQ. The pain is moderate. The quality of the pain is aching. The  abdominal pain does not radiate. Pertinent negatives include no anorexia, arthralgias, belching, constipation, diarrhea, dysuria, fever, flatus, frequency, headaches, hematochezia, hematuria, melena, myalgias, vomiting or weight loss. Nothing aggravates the pain. The pain is relieved by nothing. She has tried nothing for the symptoms. There is no history of colon cancer.    Past Medical History:  Diagnosis Date  . Allergic rhinitis   . Anxiety disorder   . GERD (gastroesophageal reflux disease)   . History of migraine   . Hypertension   . Type 2 diabetes mellitus ([place])    Not sure when she was diagnosed, greater than five years.     Past Surgical History:  Procedure Laterality Date  . ABDOMINAL HYSTERECTOMY     has ovaries.   [name] CARDIAC CATHETERIZATION N/A 08/03/2016   Procedure: Left Heart Cath and Coronary Angiography;  Surgeon: Peter M Martinique, MD;  Location: [facility];  Service: Cardiovascular;  Laterality: N/A;  . Miinor skin surgery      Family History  Problem Relation Age of Onset  . Hypertension Mother   . Diabetes Mother   . Hypertension Son   . Diabetes Son   . Cancer Father   . Thyroid cancer Sister   . Cirrhosis Brother   . Alcohol abuse Brother      Social History   Social History  . Marital status: Single    Spouse name: N/A  . Number of children: N/A  . Years of education: N/A   Social History Main Topics  . Smoking status: Never Smoker  . Smokeless tobacco: Never Used  . Alcohol use No  . Drug use: No  . Sexual activity: Not Asked   Other Topics Concern  . None   Social History Narrative   Reports is on disability. Has one son. Lives in [place]. Enjoys time with family. Enjoys Firefighter. Attends church.    Current Outpatient Prescriptions on File Prior to Visit  Medication Sig Dispense Refill  . amoxicillin-clavulanate (AUGMENTIN) 250-125 MG tablet Take 1 tablet by mouth 3 (three) times daily.    [name] aspirin EC 81 MG tablet Take 81 mg  by mouth daily.    [name] augmented betamethasone dipropionate (DIPROLENE-AF) 0.05 % cream Apply 1 application topically 2 (two) times daily as needed (for itching).    . benzonatate (TESSALON) 100 MG capsule Take 100 mg by mouth 3 (three) times daily as needed for cough.    . busPIRone (BUSPAR) 10 MG tablet Take 10 mg by mouth 3 (three) times daily as needed (for anxiety).    . canagliflozin (INVOKANA) 300 MG TABS tablet Take 300 mg by mouth daily.    . cholecalciferol (VITAMIN D) 1000 units tablet Take 1,000 Units by mouth daily.    . cycloSPORINE (RESTASIS) 0.05 % ophthalmic emulsion Place 1 drop into both eyes 2 (two) times daily as needed (for eye dryness.).    [name] DEXILANT 30 MG capsule TAKE 1 CAPSULE BY MOUTH DAILY 30 capsule 3  . diclofenac (VOLTAREN) 75 MG EC tablet Take 75 mg by mouth 2 (two) times daily.    [name]  diclofenac sodium (VOLTAREN) 1 % GEL APPLY TWO GRAMS TO THE AFFECTED AREA FOUR TIMES DAILY.  5  . fluconazole (DIFLUCAN) 150 MG tablet TAKE ONE TABLET BY MOUTH AS A SINGLE DOSE  0  . fluticasone (FLONASE) 50 MCG/ACT nasal spray Place 1-2 sprays into both nostrils daily as needed (for sinus issues.). 16 g 5  . hydrocortisone 2.5 % ointment Apply topically 2 (two) times daily.    [name] levocetirizine (XYZAL) 5 MG tablet Take 1 tablet (5 mg total) by mouth every evening. 30 tablet 3  . lisinopril (PRINIVIL,ZESTRIL) 20 MG tablet Take 2 pills each day as directed. 60 tablet 0  . loratadine (CLARITIN) 10 MG tablet TAKE ONE TABLET BY MOUTH DAILY 30 tablet 1  . meclizine (ANTIVERT) 25 MG tablet Take 25 mg by mouth 3 (three) times daily as needed for dizziness.    . metFORMIN (GLUCOPHAGE) 1000 MG tablet Take 1 tablet by mouth 2 (two) times daily.    . montelukast (SINGULAIR) 10 MG tablet TAKE 1 TABLET (10 MG TOTAL) BY MOUTH AT BEDTIME AS NEEDED (FOR ALLERGIES.). 30 tablet 3  . nabumetone (RELAFEN) 750 MG tablet TAKE ONE TABLET BY MOUTH TWICE DAILY AS DIRECTED.  5  . Olopatadine HCl (PATANASE) 0.6 %  SOLN Use 1-2 sprays each nostril daily 30.5 g 3  . omeprazole (PRILOSEC) 20 MG capsule Take 20 mg by mouth daily.    [name] PARoxetine (PAXIL) 40 MG tablet Take 0.5 tablets (20 mg total) by mouth every evening. 30 tablet 1  . tobramycin-dexamethasone (TOBRADEX) ophthalmic solution INSTIL 1 DROP IN BOTH EYES 4 TIMES A DAY  0  . traZODone (DESYREL) 50 MG tablet Take 50-100 mg by mouth at bedtime as needed for sleep.    [name] triamcinolone (NASACORT AQ) 55 MCG/ACT AERO nasal inhaler Place 2 sprays into the nose daily. 1 Inhaler 5  . verapamil (CALAN-SR) 240 MG CR tablet Take 240 mg by mouth every evening.    . Azelastine HCl 0.15 % SOLN Place 1 spray into the nose at bedtime as needed. (Patient not taking: Reported on 08/08/2017) 30 mL 5   No current facility-administered medications on file prior to visit.     Review of Systems  Constitutional: Negative for fatigue, fever and weight loss.  Eyes: Negative for blurred vision.  Respiratory: Negative for shortness of breath.   Cardiovascular: Negative for chest pain, palpitations and PND.  Gastrointestinal: Positive for abdominal pain. Negative for anorexia, constipation, diarrhea, flatus, hematochezia, melena and vomiting.  Endocrine: Negative for polydipsia, polyphagia and polyuria.  Genitourinary: Negative for dysuria, frequency and hematuria.  Musculoskeletal: Negative for arthralgias and myalgias.  Skin: Negative for pallor.  Neurological: Negative for tremors, weakness and headaches.     Objective:   BP 130/78 (BP Location: Left Arm, Patient Position: Sitting, Cuff Size: Normal)   Pulse 82   Temp 98.4 F (36.9 C) (Other (Comment))   Resp 16   Ht 5\' 3"  (1.6 m)   Wt 194 lb 8 oz (88.2 kg)   SpO2 97%   BMI 34.45 kg/m   Physical Exam  Constitutional: She appears well-developed and well-nourished. No distress.  HENT:  Head: Normocephalic and atraumatic.  Eyes: Pupils are equal, round, and reactive to light. EOM are normal.  Neck: Normal  range of motion. Neck supple.  Cardiovascular: Normal rate, regular rhythm and normal heart sounds.   Pulses:      Dorsalis pedis pulses are 2+ on the right side, and 2+ on the left side.  Posterior tibial pulses are 2+ on the right side, and 2+ on the left side.  Pulmonary/Chest: Effort normal and breath sounds normal.  Abdominal: Soft. Bowel sounds are normal. She exhibits no ascites. There is no hepatomegaly. There is tenderness in the left upper quadrant. There is no rebound and no guarding. No hernia.  Diffusely obese abdomen with large pendulous pannus. Difficult to palpate spleen due to body habitus and tenderness. No mass palpated.   Musculoskeletal:       Right foot: There is normal range of motion and no deformity.       Left foot: There is normal range of motion and no deformity.  Feet:  Right Foot:  Protective Sensation: 7 sites tested. 7 sites sensed.  Skin Integrity: Negative for ulcer, blister, skin breakdown, erythema or warmth.  Left Foot:  Protective Sensation: 7 sites tested. 7 sites sensed.  Skin Integrity: Negative for ulcer, blister, skin breakdown, erythema or warmth.  Psychiatric: Her speech is normal and behavior is normal. Judgment and thought content normal. Her affect is labile. She is not actively hallucinating.  Patient appeared and acted much calmer today. Did not get as excitable and did not yell or speak in loud voice. Voice tone and volume lower. Not as easily agitated. Smiled during exam. Still slightly labile mood especially when discussing her family, but easier to redirect.  She is attentive.  Vitals reviewed.    Assessment and Plan  1. Essential hypertension Recheck. Told to take both pills at the same time every morning.  Lifestyle modifications discussed with patient including a diet emphasizing vegetables, fruits, and whole grains. Limiting intake of sodium to less than 2,400 mg per day.  Recommendations discussed include consuming low-fat  dairy products, poultry, fish, legumes, non-tropical vegetable oils, and nuts; and limiting intake of sweets, sugar-sweetened beverages, and red meat. Discussed following a plan such as the Dietary Approaches to Stop Hypertension (DASH) diet. Patient to read up on this diet.    2. Type 2 diabetes mellitus with stage 2 chronic kidney disease, without long-term current use of insulin (HCC) Change to XL dosing of glucotrol and increase dose to get to goal of less than 7%.  - glipiZIDE (GLUCOTROL XL) 10 MG 24 hr tablet; Take 1 tablet (10 mg total) by mouth daily with breakfast.  Dispense: 30 tablet; Refill: 1  DM shoes ordered.  Foot exam done.  Eye exam UTD.  Check labs at follow up.  3. Anxiety Improved. Continue paxil as directed.  Patient counseled in detail regarding the risks of medication. Told to call or return to clinic if develop any worrisome signs or symptoms. Patient voiced understanding.  Suicide risks evaluated and documented in note if present or in the area below.  Patient has protective factors of family and community support.  Patient reports that family believes is behaving rationally. Patient displays problem solving skills.   Patient specifically denies suicide ideation. Patient has access/information to healthcare contacts if situation or mood changes where patient is a risk to self or others or mood becomes unstable.   During the encounter, the patient had good eye contact and firm handshake regarding safety contract and agreement to seek help if mood worsens and not to harm self.   Patient understands the treatment plan and is in agreement. Agrees to keep follow up and call prior or return to clinic if needed.   4. Left upper quadrant pain Discussed with patient that do suspect could be related to body habitus, however,  will check u/s to make sure that spleen is WNL. The patient is asked to make an attempt to improve diet and exercise patterns to aid in medical  management of this problem. Await results and will discuss at follow up.  - US Abdomen Complete; Future   Return in about 4 weeks (around 09/05/2017) for follow up. Caren Macadam, MD 08/08/2017

## 2017-08-08 NOTE — Telephone Encounter (Signed)
Thanks. Please call and request report and find out when she is due.

## 2017-08-08 NOTE — Telephone Encounter (Signed)
Patient has questions about her glipizide rx. Requesting to speak to you before 5pm.

## 2017-08-08 NOTE — Telephone Encounter (Signed)
CB#: [phone]  Patient wants to know when she needs to get her A1c checked again.

## 2017-08-08 NOTE — Telephone Encounter (Signed)
Patient also wants to know when she is due for colonoscopy.   Looks like a1c in November and overdue for colonoscopy. Please advise.

## 2017-08-08 NOTE — Patient Instructions (Signed)
Diabetes Mellitus and Food It is important for you to manage your blood sugar (glucose) level. Your blood glucose level can be greatly affected by what you eat. Eating healthier foods in the appropriate amounts throughout the day at about the same time each day will help you control your blood glucose level. It can also help slow or prevent worsening of your diabetes mellitus. Healthy eating may even help you improve the level of your blood pressure and reach or maintain a healthy weight. General recommendations for healthful eating and cooking habits include:  Eating meals and snacks regularly. Avoid going long periods of time without eating to lose weight.  Eating a diet that consists mainly of plant-based foods, such as fruits, vegetables, nuts, legumes, and whole grains.  Using low-heat cooking methods, such as baking, instead of high-heat cooking methods, such as deep frying.  Work with your dietitian to make sure you understand how to use the Nutrition Facts information on food labels. How can food affect me? Carbohydrates Carbohydrates affect your blood glucose level more than any other type of food. Your dietitian will help you determine how many carbohydrates to eat at each meal and teach you how to count carbohydrates. Counting carbohydrates is important to keep your blood glucose at a healthy level, especially if you are using insulin or taking certain medicines for diabetes mellitus. Alcohol Alcohol can cause sudden decreases in blood glucose (hypoglycemia), especially if you use insulin or take certain medicines for diabetes mellitus. Hypoglycemia can be a life-threatening condition. Symptoms of hypoglycemia (sleepiness, dizziness, and disorientation) are similar to symptoms of having too much alcohol. If your health care provider has given you approval to drink alcohol, do so in moderation and use the following guidelines:  Women should not have more than one drink per day, and men  should not have more than two drinks per day. One drink is equal to: ? 12 oz of beer. ? 5 oz of wine. ? 1 oz of hard liquor.  Do not drink on an empty stomach.  Keep yourself hydrated. Have water, diet soda, or unsweetened iced tea.  Regular soda, juice, and other mixers might contain a lot of carbohydrates and should be counted.  What foods are not recommended? As you make food choices, it is important to remember that all foods are not the same. Some foods have fewer nutrients per serving than other foods, even though they might have the same number of calories or carbohydrates. It is difficult to get your body what it needs when you eat foods with fewer nutrients. Examples of foods that you should avoid that are high in calories and carbohydrates but low in nutrients include:  Trans fats (most processed foods list trans fats on the Nutrition Facts label).  Regular soda.  Juice.  Candy.  Sweets, such as cake, pie, doughnuts, and cookies.  Fried foods.  What foods can I eat? Eat nutrient-rich foods, which will nourish your body and keep you healthy. The food you should eat also will depend on several factors, including:  The calories you need.  The medicines you take.  Your weight.  Your blood glucose level.  Your blood pressure level.  Your cholesterol level.  You should eat a variety of foods, including:  Protein. ? Lean cuts of meat. ? Proteins low in saturated fats, such as fish, egg whites, and beans. Avoid processed meats.  Fruits and vegetables. ? Fruits and vegetables that may help control blood glucose levels, such as apples,   mangoes, and yams.  Dairy products. ? Choose fat-free or low-fat dairy products, such as milk, yogurt, and cheese.  Grains, bread, pasta, and rice. ? Choose whole grain products, such as multigrain bread, whole oats, and brown rice. These foods may help control blood pressure.  Fats. ? Foods containing healthful fats, such as  nuts, avocado, olive oil, canola oil, and fish.  Does everyone with diabetes mellitus have the same meal plan? Because every person with diabetes mellitus is different, there is not one meal plan that works for everyone. It is very important that you meet with a dietitian who will help you create a meal plan that is just right for you. This information is not intended to replace advice given to you by your health care provider. Make sure you discuss any questions you have with your health care provider. Document Released: 06/29/2005 Document Revised: 03/09/2016 Document Reviewed: 08/29/2013 Elsevier Interactive Patient Education  2017 [employer] Inc.  

## 2017-08-08 NOTE — Telephone Encounter (Signed)
I will call patient all at once with info if possible. I have called Dr Irene Shipper office to see if they have record of last colonoscopy since patient does not know, had to leave a message with nurse.

## 2017-08-08 NOTE — Telephone Encounter (Signed)
Last colonoscopy was done by [facility] by Dr. Burke Keels 07/09/08

## 2017-08-09 NOTE — Telephone Encounter (Signed)
Faxed request

## 2017-08-09 NOTE — Telephone Encounter (Signed)
Patient was advised how to take medication.

## 2017-08-13 ENCOUNTER — Ambulatory Visit (HOSPITAL_COMMUNITY): Payer: Medicare Other

## 2017-08-14 DIAGNOSIS — M5030 Other cervical disc degeneration, unspecified cervical region: Secondary | ICD-10-CM | POA: Diagnosis not present

## 2017-08-14 DIAGNOSIS — M1990 Unspecified osteoarthritis, unspecified site: Secondary | ICD-10-CM | POA: Diagnosis not present

## 2017-08-15 ENCOUNTER — Ambulatory Visit (HOSPITAL_COMMUNITY)
Admission: RE | Admit: 2017-08-15 | Discharge: 2017-08-15 | Disposition: A | Discharge status: Home / Self Care | Payer: Medicare Other | Source: Ambulatory Visit | Attending: Family Medicine | Admitting: Family Medicine

## 2017-08-15 ENCOUNTER — Telehealth: Payer: Self-pay | Admitting: Family Medicine

## 2017-08-15 ENCOUNTER — Other Ambulatory Visit: Payer: Self-pay | Admitting: Allergy & Immunology

## 2017-08-15 DIAGNOSIS — R1012 Left upper quadrant pain: Secondary | ICD-10-CM | POA: Diagnosis not present

## 2017-08-15 DIAGNOSIS — N281 Cyst of kidney, acquired: Secondary | ICD-10-CM | POA: Insufficient documentation

## 2017-08-15 MED ORDER — DEXLANSOPRAZOLE 30 MG PO CPDR: 1.0000 | DELAYED_RELEASE_CAPSULE | Freq: Every day | ORAL | 0 refills | Status: DC

## 2017-08-15 NOTE — Telephone Encounter (Signed)
Patient informed of message below, verbalized understanding.  

## 2017-08-15 NOTE — Telephone Encounter (Signed)
Received fax for 90 day supply for Dexilant. Patient was last seen 04/03/17. Request sent in but patient needs office visit for further refills.

## 2017-08-15 NOTE — Telephone Encounter (Signed)
-----   Message from Caren Macadam, MD sent at 08/15/2017  1:52 PM EDT ----- Please call and advise patient that the abdominal ultrasound was all normal. Advised that we can discuss this at her next visit.Discussed risks of cardiovascular thrombotic events related to NSAIDS. Discussed increased risk of AMI and CVA. Discussed risk of serious GI adverse events including bleeding, ulcers, and perforation. Patient understands risks of this medication.

## 2017-08-16 ENCOUNTER — Other Ambulatory Visit: Payer: Self-pay

## 2017-08-16 DIAGNOSIS — I10 Essential (primary) hypertension: Secondary | ICD-10-CM

## 2017-08-16 MED ORDER — LISINOPRIL 20 MG PO TABS: ORAL_TABLET | ORAL | 0 refills | Status: DC

## 2017-08-16 MED ORDER — METFORMIN HCL 1000 MG PO TABS: 1000.0000 mg | ORAL_TABLET | Freq: Two times a day (BID) | ORAL | 0 refills | Status: DC

## 2017-08-24 ENCOUNTER — Other Ambulatory Visit: Payer: Self-pay

## 2017-08-24 MED ORDER — DEXLANSOPRAZOLE 30 MG PO CPDR: 1.0000 | DELAYED_RELEASE_CAPSULE | Freq: Every day | ORAL | 1 refills | Status: DC

## 2017-08-27 ENCOUNTER — Encounter: Payer: Self-pay | Admitting: Family Medicine

## 2017-08-28 ENCOUNTER — Telehealth: Payer: Self-pay | Admitting: *Deleted

## 2017-08-28 ENCOUNTER — Ambulatory Visit: Payer: Medicare Other | Admitting: Allergy & Immunology

## 2017-08-28 ENCOUNTER — Telehealth: Payer: Self-pay | Admitting: Family Medicine

## 2017-08-28 MED ORDER — CANAGLIFLOZIN 300 MG PO TABS: 300.0000 mg | ORAL_TABLET | Freq: Every day | ORAL | 3 refills | Status: DC

## 2017-08-28 NOTE — Telephone Encounter (Signed)
Patient is requesting a refill for incokana 300mg  [facility] [place]  Cb# [phone]

## 2017-08-28 NOTE — Telephone Encounter (Signed)
Done. However, I sent it in to [facility] first. Will you d/c the order to [facility]. Thanks. [name]. Mannie Stabile, MD

## 2017-08-28 NOTE — Telephone Encounter (Signed)
Please send order to [facility] with dx of Diabetes Mellitus and dx of poor circulation.  [name]. Mannie Stabile, MD

## 2017-08-28 NOTE — Telephone Encounter (Signed)
Done

## 2017-08-28 NOTE — Telephone Encounter (Signed)
I requested most recent order for diabetic shoes from [facility]. They state they have no history of patient getting diabetic shoes from them. I called patient, she states she got new diabetic shoes 2 years ago from [facility]. I called [facility] to request most recent order. They state last order was from 2013. She sent the script and one page of office notes they received. Placed on PCP desk for review.

## 2017-08-28 NOTE — Telephone Encounter (Signed)
Barbara House from [facility] in [place] called regarding diabetic shoes for the patient, per [facility] in [place] received the paper work. Please call Mardene Celeste at [phone]

## 2017-08-29 NOTE — Telephone Encounter (Signed)
Done

## 2017-08-30 DIAGNOSIS — I209 Angina pectoris, unspecified: Secondary | ICD-10-CM | POA: Diagnosis not present

## 2017-08-30 DIAGNOSIS — S79912A Unspecified injury of left hip, initial encounter: Secondary | ICD-10-CM | POA: Diagnosis not present

## 2017-08-30 DIAGNOSIS — E119 Type 2 diabetes mellitus without complications: Secondary | ICD-10-CM | POA: Diagnosis not present

## 2017-08-30 DIAGNOSIS — S99912A Unspecified injury of left ankle, initial encounter: Secondary | ICD-10-CM | POA: Diagnosis not present

## 2017-08-30 DIAGNOSIS — M25552 Pain in left hip: Secondary | ICD-10-CM | POA: Diagnosis not present

## 2017-08-30 DIAGNOSIS — I10 Essential (primary) hypertension: Secondary | ICD-10-CM | POA: Diagnosis not present

## 2017-08-30 DIAGNOSIS — K219 Gastro-esophageal reflux disease without esophagitis: Secondary | ICD-10-CM | POA: Diagnosis not present

## 2017-08-30 DIAGNOSIS — Z79899 Other long term (current) drug therapy: Secondary | ICD-10-CM | POA: Diagnosis not present

## 2017-08-30 DIAGNOSIS — F329 Major depressive disorder, single episode, unspecified: Secondary | ICD-10-CM | POA: Diagnosis not present

## 2017-08-30 DIAGNOSIS — Z7984 Long term (current) use of oral hypoglycemic drugs: Secondary | ICD-10-CM | POA: Diagnosis not present

## 2017-08-30 DIAGNOSIS — M79672 Pain in left foot: Secondary | ICD-10-CM | POA: Diagnosis not present

## 2017-08-30 DIAGNOSIS — R0789 Other chest pain: Secondary | ICD-10-CM | POA: Diagnosis not present

## 2017-08-30 DIAGNOSIS — S8992XA Unspecified injury of left lower leg, initial encounter: Secondary | ICD-10-CM | POA: Diagnosis not present

## 2017-08-30 DIAGNOSIS — R079 Chest pain, unspecified: Secondary | ICD-10-CM | POA: Diagnosis not present

## 2017-08-30 DIAGNOSIS — S99922A Unspecified injury of left foot, initial encounter: Secondary | ICD-10-CM | POA: Diagnosis not present

## 2017-08-31 ENCOUNTER — Ambulatory Visit: Payer: Medicare Other | Admitting: Allergy

## 2017-08-31 DIAGNOSIS — J309 Allergic rhinitis, unspecified: Secondary | ICD-10-CM

## 2017-08-31 DIAGNOSIS — I209 Angina pectoris, unspecified: Secondary | ICD-10-CM | POA: Diagnosis not present

## 2017-08-31 DIAGNOSIS — R0789 Other chest pain: Secondary | ICD-10-CM | POA: Diagnosis not present

## 2017-09-10 ENCOUNTER — Ambulatory Visit (INDEPENDENT_AMBULATORY_CARE_PROVIDER_SITE_OTHER): Payer: Medicare Other | Admitting: Allergy

## 2017-09-10 ENCOUNTER — Encounter: Payer: Self-pay | Admitting: Allergy

## 2017-09-10 VITALS — BP 138/76 | HR 76 | Resp 16

## 2017-09-10 DIAGNOSIS — K219 Gastro-esophageal reflux disease without esophagitis: Secondary | ICD-10-CM | POA: Diagnosis not present

## 2017-09-10 DIAGNOSIS — J3089 Other allergic rhinitis: Secondary | ICD-10-CM

## 2017-09-10 NOTE — Patient Instructions (Addendum)
1. Perennial allergic rhinitis - Continue with recommend that you use nasal sprays on a daily basis: nasal saline rinses twice daily + Flonase two sprays per nostril daily + Patanase two sprays per nostril 2 times daily - Continue with montelukast 10mg  daily. - Continue with Xyzal 5mg  daily.  - These medications need to be used every day for the best effect. - Use nasal saline rinses 1-2 times daily.  - We will get environmental allergy testing to see if there is any evidence of environmental allergies.   - follow-up with ENT, Dr. Benjamine Mola.   2. GERD (reflux) - Continue with Dexilant 30mg  daily and Ranitidine 150mg  2 times a day    Return in about 3-4 months or sooner if needed

## 2017-09-10 NOTE — Progress Notes (Signed)
Follow-up Note  RE: TIAHNA CURE MRN: [id number] DOB: October 27, 1955 Date of Office Visit: 09/10/2017   History of present illness: Barbara House is a 61 y.o. female presenting today for follow-up of allergic rhinitis and reflux.  She was last seen for follow-up by Dr. Ernst Bowler on April 03, 2017.  Since that time she states she has seen Dr. Benjamine Mola ENT at his [place] location.  She states that she was told she has a nasal obstruction and that he recommended surgery however she still thinking about this treatment option.  I do not have access to these records at this time.  She states that for the past week or so she has been having more nasal drainage that is draining down the back of her throat and settling.  She also feels that her ears "ache ".  She states with the nasal drainage it does lead to a cough.  She denies any fever or any other sick type symptoms and no sick contacts that she is aware of.  States that she does have both Flonase and Patanase but have been using these only as needed and states that she has not been using them this past week with the increase in nasal drainage.  She also states she has access to nasal saline rinses however she only does these as needed when she is "stuffy ".  She does state that she uses her montelukast daily and had been using her Xyzal daily until she ran out.  She has not required any oral steroid or antibiotics since her last visit.  States that her reflux has been worse and she states her PCP added on ranitidine that he takes daily.  She was already on Dexilant daily.  She does feel that the combination of these 2 medications has been more helpful in controlling her reflux symptoms.     Review of systems: Review of Systems  Constitutional: Negative for chills, fever and malaise/fatigue.  HENT: Positive for congestion, ear pain and sore throat. Negative for ear discharge, hearing loss, nosebleeds, sinus pain and tinnitus.   Eyes: Negative for  pain, discharge and redness.  Respiratory: Positive for cough. Negative for sputum production, shortness of breath and wheezing.   Cardiovascular: Negative for chest pain.  Gastrointestinal: Positive for abdominal pain and heartburn. Negative for constipation, diarrhea, nausea and vomiting.  Musculoskeletal: Negative for joint pain.  Skin: Negative for itching and rash.  Neurological: Negative for headaches.    All other systems negative unless noted above in HPI  Past medical/social/surgical/family history have been reviewed and are unchanged unless specifically indicated below.  No changes  Medication List: Allergies as of 09/10/2017      Reactions   Prednisone Other (See Comments)   Hair loss      Medication List        Accurate as of 09/10/17 12:35 PM. Always use your most recent med list.          aspirin EC 81 MG tablet Take 81 mg by mouth daily.   augmented betamethasone dipropionate 0.05 % cream Commonly known as:  DIPROLENE-AF Apply 1 application topically 2 (two) times daily as needed (for itching).   Azelastine HCl 0.15 % Soln Place 1 spray into the nose at bedtime as needed.   busPIRone 10 MG tablet Commonly known as:  BUSPAR Take 10 mg by mouth 3 (three) times daily as needed (for anxiety).   canagliflozin 300 MG Tabs tablet Commonly known as:  INVOKANA Take  1 tablet (300 mg total) daily by mouth.   cholecalciferol 1000 units tablet Commonly known as:  VITAMIN D Take 1,000 Units by mouth daily.   cycloSPORINE 0.05 % ophthalmic emulsion Commonly known as:  RESTASIS Place 1 drop into both eyes 2 (two) times daily as needed (for eye dryness.).   Dexlansoprazole 30 MG capsule Commonly known as:  DEXILANT Take 1 capsule (30 mg total) daily by mouth.   fluticasone 50 MCG/ACT nasal spray Commonly known as:  FLONASE Place 1-2 sprays into both nostrils daily as needed (for sinus issues.).   glipiZIDE 10 MG 24 hr tablet Commonly known as:  GLUCOTROL  XL Take 1 tablet (10 mg total) by mouth daily with breakfast.   hydrocortisone 2.5 % ointment Apply topically 2 (two) times daily.   levocetirizine 5 MG tablet Commonly known as:  XYZAL Take 1 tablet (5 mg total) by mouth every evening.   lisinopril 20 MG tablet Commonly known as:  PRINIVIL,ZESTRIL Take 2 pills each day as directed.   metFORMIN 1000 MG tablet Commonly known as:  GLUCOPHAGE Take 1 tablet (1,000 mg total) by mouth 2 (two) times daily.   montelukast 10 MG tablet Commonly known as:  SINGULAIR TAKE 1 TABLET (10 MG TOTAL) BY MOUTH AT BEDTIME AS NEEDED (FOR ALLERGIES.).   nabumetone 750 MG tablet Commonly known as:  RELAFEN TAKE ONE TABLET BY MOUTH TWICE DAILY AS DIRECTED.   Olopatadine HCl 0.6 % Soln Commonly known as:  PATANASE Use 1-2 sprays each nostril daily   PARoxetine 40 MG tablet Commonly known as:  PAXIL Take 0.5 tablets (20 mg total) by mouth every evening.   ranitidine 150 MG tablet Commonly known as:  ZANTAC Take 150 mg by mouth 2 (two) times daily.   traZODone 50 MG tablet Commonly known as:  DESYREL Take 50-100 mg by mouth at bedtime as needed for sleep.   UNABLE TO FIND Diabetic shoe   verapamil 240 MG CR tablet Commonly known as:  CALAN-SR Take 240 mg by mouth every evening.       Known medication allergies: Allergies  Allergen Reactions  . Prednisone Other (See Comments)    Hair loss     Physical examination: Blood pressure 138/76, pulse 76, resp. rate 16.  General: Alert, interactive, in no acute distress. HEENT: PERRLA, TMs pearly gray, turbinates moderately edematous L>R with clear discharge, post-pharynx non erythematous. Neck: Supple without lymphadenopathy. Lungs: Clear to auscultation without wheezing, rhonchi or rales. {no increased work of breathing. CV: Normal S1, S2 without murmurs. Abdomen: Nondistended, nontender. Skin: Warm and dry, without lesions or rashes. Extremities:  No clubbing, cyanosis or  edema. Neuro:   Grossly intact.  Diagnositics/Labs: None today  Assessment and plan:   1. Perennial allergic rhinitis - Continue use nasal sprays on a daily basis: nasal saline rinses twice daily + Flonase two sprays per nostril daily + Patanase two sprays per nostril 2 times daily - Continue with montelukast 10mg  daily. - Continue with Xyzal 5mg  daily.  - These medications need to be used every day for the best effect which was discussed again at length to see decreased symptoms - Use nasal saline rinses 1-2 times daily.  - We will get environmental allergy testing to see if there is any evidence of environmental allergies.   - follow-up with ENT, Dr. Benjamine Mola.  He has recommended sinus surgery however she would like to continue to think on this option at this time.  I am not able to access his  records at this time but will plan to request to see exactly what was recommended.   2. GERD (reflux) - Continue with Dexilant 30mg  daily and Ranitidine 150mg  2 times a day   Return in about 3-4 months or sooner if needed  I appreciate the opportunity to take part in Girtha's care. Please do not hesitate to contact me with questions.  Sincerely,   Prudy Feeler, MD Allergy/Immunology [facility]

## 2017-09-11 ENCOUNTER — Ambulatory Visit (INDEPENDENT_AMBULATORY_CARE_PROVIDER_SITE_OTHER): Payer: Medicare Other | Admitting: Family Medicine

## 2017-09-11 ENCOUNTER — Other Ambulatory Visit: Payer: Self-pay

## 2017-09-11 ENCOUNTER — Encounter: Payer: Self-pay | Admitting: Family Medicine

## 2017-09-11 ENCOUNTER — Ambulatory Visit (HOSPITAL_COMMUNITY): Admission: RE | Admit: 2017-09-11 | Payer: Medicare Other | Source: Ambulatory Visit

## 2017-09-11 VITALS — BP 140/82 | HR 63 | Temp 99.1°F | Resp 16 | Wt 194.8 lb

## 2017-09-11 DIAGNOSIS — N201 Calculus of ureter: Secondary | ICD-10-CM | POA: Diagnosis not present

## 2017-09-11 DIAGNOSIS — M79662 Pain in left lower leg: Secondary | ICD-10-CM

## 2017-09-11 DIAGNOSIS — R1310 Dysphagia, unspecified: Secondary | ICD-10-CM

## 2017-09-11 DIAGNOSIS — F419 Anxiety disorder, unspecified: Secondary | ICD-10-CM | POA: Insufficient documentation

## 2017-09-11 DIAGNOSIS — Z113 Encounter for screening for infections with a predominantly sexual mode of transmission: Secondary | ICD-10-CM | POA: Diagnosis not present

## 2017-09-11 DIAGNOSIS — I10 Essential (primary) hypertension: Secondary | ICD-10-CM | POA: Diagnosis not present

## 2017-09-11 DIAGNOSIS — K219 Gastro-esophageal reflux disease without esophagitis: Secondary | ICD-10-CM

## 2017-09-11 LAB — BASIC METABOLIC PANEL
BUN: 15 mg/dL (ref 7–25)
CO2: 30 mmol/L (ref 20–32)
CREATININE: 0.79 mg/dL (ref 0.50–1.05)
Calcium: 10.2 mg/dL (ref 8.6–10.4)
Chloride: 104 mmol/L (ref 98–110)
GLUCOSE: 135 mg/dL (ref 65–139)
POTASSIUM: 4.2 mmol/L (ref 3.5–5.3)
Sodium: 141 mmol/L (ref 135–146)

## 2017-09-11 LAB — HEPATITIS PANEL, ACUTE
HEP A IGM: NONREACTIVE
Hep B C IgM: NONREACTIVE
Hepatitis B Surface Ag: NONREACTIVE
Hepatitis C Ab: NONREACTIVE
SIGNAL TO CUT-OFF: 0.03 (ref <1.00)

## 2017-09-11 LAB — HIV ANTIBODY (ROUTINE TESTING W REFLEX): HIV: NONREACTIVE

## 2017-09-11 MED ORDER — HYDROCHLOROTHIAZIDE 25 MG PO TABS: 25.0000 mg | ORAL_TABLET | Freq: Every day | ORAL | 0 refills | Status: DC

## 2017-09-11 NOTE — Progress Notes (Signed)
Patient ID: Barbara House, female    DOB: [birth year], 61 y.o.   MRN: [id number]  Chief Complaint  Patient presents with  . Follow-up  . Diabetes  . Hypertension    Allergies Prednisone  Subjective:   Barbara House is a 61 y.o. female who presents to [facility] today.  HPI Barbara House presents today for follow-up office visit.  She has increased her lisinopril to 40 mg a day as directed.  She takes her blood pressure medicines in the evening because she reports she cannot remember to take them otherwise.  She has had no side effects with the increased dose.  She does not check her blood pressures at home but reports she has been feeling fine.  Reports that over the past month has had a worsening in her reflux.  Barbara House Has been on Dexilant long-term for GERD and then was seen a few weeks ago by another physician and put on Zantac in addition to it.  Reports that he is having trouble swallowing her food down.  Reports that she feels like her food gets stuck in her throat and has to try multiple times to get it down.  Reports that if she bends over she has acid come back in her mouth.  Reports that has been having worsening of reflux and regurgitation.  He is not having any pain in her abdomen or chest.  Reports her last endoscopy was many years ago and at that time was placed on reflux medications.  Does not have a local GI doctor.  Reports that her voice is very raspy a little more than normal.  11/22 s/p fall on the ice where legs split. Has gotten bigger and has been swollen. Was seen at the ED in [place], [place].  Reports that they did x-rays of her legs at that time which were normal.  She had a brace at home but and so when she went home she put her left leg in a brace.  Reports that since that time her left calf has been more tender and swollen.  Reports it hurts throughout the day.   Diabetes  She presents for her follow-up diabetic visit. She has type 2  diabetes mellitus. No MedicAlert identification noted. Her disease course has been improving. There are no hypoglycemic associated symptoms. There are no diabetic associated symptoms. Pertinent negatives for diabetes include no foot ulcerations, no polydipsia and no polyphagia. Symptoms are improving. Risk factors for coronary artery disease include family history, dyslipidemia, diabetes mellitus, obesity and hypertension. Current diabetic treatment includes oral agent (triple therapy). She is compliant with treatment most of the time. Her weight is stable. She is following a diabetic and generally healthy diet. Meal planning includes avoidance of concentrated sweets. She participates in exercise intermittently. Her overall blood glucose range is 90-110 mg/dl. An ACE inhibitor/angiotensin II receptor blocker is being taken. She does not see a podiatrist.Eye exam is current.  Hypertension  This is a chronic problem. The current episode started more than 1 year ago. The problem has been waxing and waning since onset. The problem is controlled. There are no associated agents to hypertension. Risk factors for coronary artery disease include diabetes mellitus, dyslipidemia, family history, obesity and sedentary lifestyle. Past treatments include ACE inhibitors, diuretics and lifestyle changes. The current treatment provides moderate improvement. Compliance problems include diet.     Past Medical History:  Diagnosis Date  . Allergic rhinitis   . Anxiety disorder   .  GERD (gastroesophageal reflux disease)   . History of migraine   . Hypertension   . Type 2 diabetes mellitus ([place])    Not sure when she was diagnosed, greater than five years.     Past Surgical History:  Procedure Laterality Date  . ABDOMINAL HYSTERECTOMY     has ovaries.   Barbara House CARDIAC CATHETERIZATION N/A 08/03/2016   Procedure: Left Heart Cath and Coronary Angiography;  Surgeon: Peter M Martinique, MD;  Location: [facility];  Service:  Cardiovascular;  Laterality: N/A;  . Miinor skin surgery      Family History  Problem Relation Age of Onset  . Hypertension Mother   . Diabetes Mother   . Hypertension Son   . Diabetes Son   . Cancer Father   . Thyroid cancer Sister   . Cirrhosis Brother   . Alcohol abuse Brother      Social History   Socioeconomic History  . Marital status: Single    Spouse name: None  . Number of children: None  . Years of education: None  . Highest education level: None  Social Needs  . Financial resource strain: None  . Food insecurity - worry: None  . Food insecurity - inability: None  . Transportation needs - medical: None  . Transportation needs - non-medical: None  Occupational History  . None  Tobacco Use  . Smoking status: Never Smoker  . Smokeless tobacco: Never Used  Substance and Sexual Activity  . Alcohol use: No    Alcohol/week: 0.0 oz  . Drug use: No  . Sexual activity: None  Other Topics Concern  . None  Social History Narrative   Reports is on disability. Has one son. Lives in [place]. Enjoys time with family. Enjoys Firefighter. Attends church.    Current Outpatient Medications on File Prior to Visit  Medication Sig Dispense Refill  . aspirin EC 81 MG tablet Take 81 mg by mouth daily.    Barbara House augmented betamethasone dipropionate (DIPROLENE-AF) 0.05 % cream Apply 1 application topically 2 (two) times daily as needed (for itching).    . Azelastine HCl 0.15 % SOLN Place 1 spray into the nose at bedtime as needed. 30 mL 5  . busPIRone (BUSPAR) 10 MG tablet Take 10 mg by mouth 3 (three) times daily as needed (for anxiety).    . canagliflozin (INVOKANA) 300 MG TABS tablet Take 1 tablet (300 mg total) daily by mouth. 30 tablet 3  . cholecalciferol (VITAMIN D) 1000 units tablet Take 1,000 Units by mouth daily.    . cycloSPORINE (RESTASIS) 0.05 % ophthalmic emulsion Place 1 drop into both eyes 2 (two) times daily as needed (for eye dryness.).    Barbara House Dexlansoprazole (DEXILANT)  30 MG capsule Take 1 capsule (30 mg total) daily by mouth. 90 capsule 1  . fluticasone (FLONASE) 50 MCG/ACT nasal spray Place 1-2 sprays into both nostrils daily as needed (for sinus issues.). 16 g 5  . glipiZIDE (GLUCOTROL XL) 10 MG 24 hr tablet Take 1 tablet (10 mg total) by mouth daily with breakfast. 30 tablet 1  . hydrocortisone 2.5 % ointment Apply topically 2 (two) times daily.    Barbara House levocetirizine (XYZAL) 5 MG tablet Take 1 tablet (5 mg total) by mouth every evening. 30 tablet 3  . lisinopril (PRINIVIL,ZESTRIL) 20 MG tablet Take 2 pills each day as directed. 60 tablet 0  . metFORMIN (GLUCOPHAGE) 1000 MG tablet Take 1 tablet (1,000 mg total) by mouth 2 (two) times daily. [place]  tablet 0  . montelukast (SINGULAIR) 10 MG tablet TAKE 1 TABLET (10 MG TOTAL) BY MOUTH AT BEDTIME AS NEEDED (FOR ALLERGIES.). 30 tablet 3  . nabumetone (RELAFEN) 750 MG tablet TAKE ONE TABLET BY MOUTH TWICE DAILY AS DIRECTED.  5  . Olopatadine HCl (PATANASE) 0.6 % SOLN Use 1-2 sprays each nostril daily 30.5 g 3  . PARoxetine (PAXIL) 40 MG tablet Take 0.5 tablets (20 mg total) by mouth every evening. 30 tablet 1  . ranitidine (ZANTAC) 150 MG tablet Take 150 mg by mouth 2 (two) times daily.  0  . traZODone (DESYREL) 50 MG tablet Take 50-100 mg by mouth at bedtime as needed for sleep.    Barbara House UNABLE TO FIND Diabetic shoe 1 each 0  . verapamil (CALAN-SR) 240 MG CR tablet Take 240 mg by mouth every evening.     No current facility-administered medications on file prior to visit.     Review of Systems  Endocrine: Negative for polydipsia and polyphagia.     Objective:   BP 140/82   Pulse 63   Temp 99.1 F (37.3 C) (Other (Comment))   Resp 16   Wt 194 lb 12 oz (88.3 kg)   SpO2 97%   BMI 34.50 kg/m   Physical Exam  Constitutional: She is oriented to person, place, and time. She appears well-developed and well-nourished. No distress.  HENT:  Head: Normocephalic and atraumatic.  Eyes: Conjunctivae are normal. Pupils  are equal, round, and reactive to light. No scleral icterus.  Neck: Normal range of motion. Neck supple. No thyromegaly present.  Cardiovascular: Normal rate, regular rhythm and normal heart sounds.  Pulmonary/Chest: Effort normal and breath sounds normal. No respiratory distress.  Abdominal: Soft. Bowel sounds are normal. She exhibits no distension.  Musculoskeletal:       Left lower leg: She exhibits tenderness, swelling and edema. She exhibits no deformity and no laceration.  Left calf circumference 41 cm Right calf circumference 38 cm  Left calf tender to palpation. No erythema noted.   Neurological: She is alert and oriented to person, place, and time. No cranial nerve deficit.  Skin: Skin is warm and dry.  Psychiatric: She has a normal mood and affect. Her behavior is normal. Thought content normal.  Nursing note and vitals reviewed.    Assessment and Plan  1. Chronic GERD Patient on long-term PPI now with new symptoms of difficulty swallowing and worsening reflux.  Refer to GI for endoscopy to rule out stricture or other pathologic condition. - Ambulatory referral to Gastroenterology  2. Dysphagia, unspecified type Continue PPI and H2 blocker as directed.  Follow-up with GI.  Counseled concerning worrisome signs and symptoms. - Ambulatory referral to Gastroenterology  3. HTN, goal below 130/80 Blood pressure still not at goal.  Will add HCTZ to current regimen.  Plan on rechecking in 1 month and check BMP at that time. - hydrochlorothiazide (HYDRODIURIL) 25 MG tablet; Take 1 tablet (25 mg total) by mouth daily.  Dispense: 90 tablet; Refill: 0 - Basic Metabolic Panel (BMET)  Lifestyle modifications discussed with patient including a diet emphasizing vegetables, fruits, and whole grains. Limiting intake of sodium to less than 2,400 mg per day.  Recommendations discussed include consuming low-fat dairy products, poultry, fish, legumes, non-tropical vegetable oils, and nuts; and  limiting intake of sweets, sugar-sweetened beverages, and red meat. Discussed following a plan such as the Dietary Approaches to Stop Hypertension (DASH) diet. Patient to read up on this diet.  The patient is  asked to make an attempt to improve diet and exercise patterns to aid in medical management of this problem.  4. Screen for STD (sexually transmitted disease) Ordered secondary to necessary screening. - Hepatitis panel, acute - HIV antibody  5. Pain of left calf Secondary to trauma and patient's symptoms and physical exam will rule out DVT at this time.  Patient sent for stat DVT. - US ARTERIAL LOWER EXTREMITY DUPLEX BILATERAL - US Venous Img Lower Unilateral Left  Return in about 4 weeks (around 10/09/2017) for BP check. Caren Macadam, MD 09/11/2017

## 2017-09-12 ENCOUNTER — Telehealth: Payer: Self-pay | Admitting: *Deleted

## 2017-09-12 ENCOUNTER — Ambulatory Visit (HOSPITAL_COMMUNITY): Payer: Medicare Other

## 2017-09-12 ENCOUNTER — Telehealth: Payer: Self-pay | Admitting: Family Medicine

## 2017-09-12 ENCOUNTER — Ambulatory Visit (HOSPITAL_COMMUNITY)
Admission: RE | Admit: 2017-09-12 | Discharge: 2017-09-12 | Disposition: A | Discharge status: Home / Self Care | Payer: Medicare Other | Source: Ambulatory Visit | Attending: Family Medicine | Admitting: Family Medicine

## 2017-09-12 DIAGNOSIS — M542 Cervicalgia: Secondary | ICD-10-CM | POA: Diagnosis not present

## 2017-09-12 DIAGNOSIS — M79671 Pain in right foot: Secondary | ICD-10-CM | POA: Diagnosis not present

## 2017-09-12 DIAGNOSIS — M79662 Pain in left lower leg: Secondary | ICD-10-CM | POA: Diagnosis not present

## 2017-09-12 DIAGNOSIS — R262 Difficulty in walking, not elsewhere classified: Secondary | ICD-10-CM | POA: Diagnosis not present

## 2017-09-12 DIAGNOSIS — M6281 Muscle weakness (generalized): Secondary | ICD-10-CM | POA: Diagnosis not present

## 2017-09-12 LAB — IGE+ALLERGENS ZONE 3(27)
Alternaria Alternata IgE: <0.1 kU/L
Bermuda Grass IgE: <0.1 kU/L
Cladosporium Herbarum IgE: <0.1 kU/L
Cockroach, American IgE: <0.1 kU/L
D Pteronyssinus IgE: <0.1 kU/L
Hickory, White IgE: <0.1 kU/L
IGE (IMMUNOGLOBULIN E), SERUM: 23 [IU]/mL (ref 0–100)
Johnson Grass IgE: <0.1 kU/L
Kentucky Bluegrass IgE: <0.1 kU/L
Mucor Racemosus IgE: <0.1 kU/L
Penicillium Chrysogen IgE: <0.1 kU/L
Plantain, English IgE: <0.1 kU/L
Ragweed, Short IgE: <0.1 kU/L
White Mulberry IgE: <0.1 kU/L

## 2017-09-12 NOTE — Telephone Encounter (Signed)
Called patient regarding message below. No answer, unable to leave message.  

## 2017-09-12 NOTE — Telephone Encounter (Signed)
-----   Message from Caren Macadam, MD sent at 09/12/2017  8:45 AM EST ----- Please call and advise that HIV, hepatitis, electrolytes, kidney tests are normal. Keep follow up. Take House BP medications as directed. Barbara House. Mannie Stabile, MD

## 2017-09-12 NOTE — Telephone Encounter (Signed)
Please call patient and advised that her ultrasound of her left lower extremity did not reveal evidence of a blood clot.  Advised her that the swelling is most likely due to the trauma from her fall.  Advised her to call with any questions or concerns and to keep her follow-up.

## 2017-09-12 NOTE — Telephone Encounter (Signed)
Patient called for the results from her xray on her leg. Please advise [phone]

## 2017-09-13 ENCOUNTER — Encounter: Payer: Self-pay | Admitting: Gastroenterology

## 2017-09-13 DIAGNOSIS — N200 Calculus of kidney: Secondary | ICD-10-CM | POA: Diagnosis not present

## 2017-09-13 NOTE — Telephone Encounter (Signed)
Patient informed of message below, verbalized understanding.  

## 2017-09-13 NOTE — Telephone Encounter (Signed)
Informed patient of note per pervious telephone note

## 2017-09-20 ENCOUNTER — Ambulatory Visit (HOSPITAL_COMMUNITY): Admission: RE | Admit: 2017-09-20 | Payer: Medicare Other | Source: Ambulatory Visit

## 2017-09-21 DIAGNOSIS — R262 Difficulty in walking, not elsewhere classified: Secondary | ICD-10-CM | POA: Diagnosis not present

## 2017-09-21 DIAGNOSIS — M542 Cervicalgia: Secondary | ICD-10-CM | POA: Diagnosis not present

## 2017-09-21 DIAGNOSIS — M6281 Muscle weakness (generalized): Secondary | ICD-10-CM | POA: Diagnosis not present

## 2017-09-21 DIAGNOSIS — M79671 Pain in right foot: Secondary | ICD-10-CM | POA: Diagnosis not present

## 2017-09-27 ENCOUNTER — Other Ambulatory Visit: Payer: Self-pay

## 2017-09-27 DIAGNOSIS — M542 Cervicalgia: Secondary | ICD-10-CM | POA: Diagnosis not present

## 2017-09-27 DIAGNOSIS — R262 Difficulty in walking, not elsewhere classified: Secondary | ICD-10-CM | POA: Diagnosis not present

## 2017-09-27 DIAGNOSIS — M79671 Pain in right foot: Secondary | ICD-10-CM | POA: Diagnosis not present

## 2017-09-27 DIAGNOSIS — M6281 Muscle weakness (generalized): Secondary | ICD-10-CM | POA: Diagnosis not present

## 2017-09-28 DIAGNOSIS — R251 Tremor, unspecified: Secondary | ICD-10-CM | POA: Diagnosis not present

## 2017-09-28 DIAGNOSIS — W19XXXA Unspecified fall, initial encounter: Secondary | ICD-10-CM | POA: Diagnosis not present

## 2017-09-28 DIAGNOSIS — M25572 Pain in left ankle and joints of left foot: Secondary | ICD-10-CM | POA: Diagnosis not present

## 2017-09-28 DIAGNOSIS — S92152A Displaced avulsion fracture (chip fracture) of left talus, initial encounter for closed fracture: Secondary | ICD-10-CM | POA: Diagnosis not present

## 2017-09-28 DIAGNOSIS — R269 Unspecified abnormalities of gait and mobility: Secondary | ICD-10-CM | POA: Diagnosis not present

## 2017-09-28 DIAGNOSIS — S92252A Displaced fracture of navicular [scaphoid] of left foot, initial encounter for closed fracture: Secondary | ICD-10-CM | POA: Diagnosis not present

## 2017-10-02 DIAGNOSIS — E1149 Type 2 diabetes mellitus with other diabetic neurological complication: Secondary | ICD-10-CM | POA: Diagnosis not present

## 2017-10-02 DIAGNOSIS — L600 Ingrowing nail: Secondary | ICD-10-CM | POA: Diagnosis not present

## 2017-10-02 DIAGNOSIS — M79671 Pain in right foot: Secondary | ICD-10-CM | POA: Diagnosis not present

## 2017-10-02 DIAGNOSIS — M6281 Muscle weakness (generalized): Secondary | ICD-10-CM | POA: Diagnosis not present

## 2017-10-02 DIAGNOSIS — M79674 Pain in right toe(s): Secondary | ICD-10-CM | POA: Diagnosis not present

## 2017-10-02 DIAGNOSIS — M79675 Pain in left toe(s): Secondary | ICD-10-CM | POA: Diagnosis not present

## 2017-10-02 DIAGNOSIS — L602 Onychogryphosis: Secondary | ICD-10-CM | POA: Diagnosis not present

## 2017-10-02 DIAGNOSIS — R262 Difficulty in walking, not elsewhere classified: Secondary | ICD-10-CM | POA: Diagnosis not present

## 2017-10-02 DIAGNOSIS — B351 Tinea unguium: Secondary | ICD-10-CM | POA: Diagnosis not present

## 2017-10-02 DIAGNOSIS — M542 Cervicalgia: Secondary | ICD-10-CM | POA: Diagnosis not present

## 2017-10-04 ENCOUNTER — Telehealth: Payer: Self-pay | Admitting: Family Medicine

## 2017-10-04 DIAGNOSIS — R262 Difficulty in walking, not elsewhere classified: Secondary | ICD-10-CM | POA: Diagnosis not present

## 2017-10-04 DIAGNOSIS — M542 Cervicalgia: Secondary | ICD-10-CM | POA: Diagnosis not present

## 2017-10-04 DIAGNOSIS — M79671 Pain in right foot: Secondary | ICD-10-CM | POA: Diagnosis not present

## 2017-10-04 DIAGNOSIS — M6281 Muscle weakness (generalized): Secondary | ICD-10-CM | POA: Diagnosis not present

## 2017-10-04 MED ORDER — VERAPAMIL HCL ER 240 MG PO TBCR
240.0000 mg | EXTENDED_RELEASE_TABLET | Freq: Every evening | ORAL | 0 refills | Status: DC
Start: 2017-10-04 — End: 2017-11-26

## 2017-10-04 NOTE — Telephone Encounter (Signed)
Pt is requesting verapamil (CALAN-SR) 240 MG CR tablet to be refilled

## 2017-10-10 ENCOUNTER — Telehealth: Payer: Self-pay | Admitting: Family Medicine

## 2017-10-10 ENCOUNTER — Other Ambulatory Visit: Payer: Self-pay

## 2017-10-10 DIAGNOSIS — M542 Cervicalgia: Secondary | ICD-10-CM | POA: Diagnosis not present

## 2017-10-10 DIAGNOSIS — M79671 Pain in right foot: Secondary | ICD-10-CM | POA: Diagnosis not present

## 2017-10-10 DIAGNOSIS — M6281 Muscle weakness (generalized): Secondary | ICD-10-CM | POA: Diagnosis not present

## 2017-10-10 DIAGNOSIS — N182 Chronic kidney disease, stage 2 (mild): Principal | ICD-10-CM

## 2017-10-10 DIAGNOSIS — E1122 Type 2 diabetes mellitus with diabetic chronic kidney disease: Secondary | ICD-10-CM

## 2017-10-10 DIAGNOSIS — R262 Difficulty in walking, not elsewhere classified: Secondary | ICD-10-CM | POA: Diagnosis not present

## 2017-10-10 MED ORDER — GLIPIZIDE ER 10 MG PO TB24: 10.0000 mg | ORAL_TABLET | Freq: Every day | ORAL | 1 refills | Status: DC

## 2017-10-10 NOTE — Telephone Encounter (Signed)
Glipizide resent and form ready for dr to sign for dm shoes, then will be resent

## 2017-10-10 NOTE — Telephone Encounter (Signed)
Pt is calling in and wants her medicine  glipiZIDE (GLUCOTROL XL) 10 MG 24 hr tablet 30 tablet 1 08/08/2017    Sig - Route: Take 1 tablet (10 mg total) by mouth daily with breakfast. - Oral   Sent to pharmacy as: glipiZIDE (GLUCOTROL XL) 10 MG 24 hr tablet   E-Prescribing Status: Receipt confirmed by pharmacy (08/08/2017 10:18 AM EDT)    Also calling inregards to Diabectic shoes

## 2017-10-11 DIAGNOSIS — M5030 Other cervical disc degeneration, unspecified cervical region: Secondary | ICD-10-CM | POA: Diagnosis not present

## 2017-10-11 DIAGNOSIS — M21161 Varus deformity, not elsewhere classified, right knee: Secondary | ICD-10-CM | POA: Diagnosis not present

## 2017-10-11 DIAGNOSIS — M5136 Other intervertebral disc degeneration, lumbar region: Secondary | ICD-10-CM | POA: Diagnosis not present

## 2017-10-11 DIAGNOSIS — M25561 Pain in right knee: Secondary | ICD-10-CM | POA: Diagnosis not present

## 2017-10-11 DIAGNOSIS — M1711 Unilateral primary osteoarthritis, right knee: Secondary | ICD-10-CM | POA: Diagnosis not present

## 2017-10-11 DIAGNOSIS — M1990 Unspecified osteoarthritis, unspecified site: Secondary | ICD-10-CM | POA: Diagnosis not present

## 2017-10-12 ENCOUNTER — Encounter: Payer: Self-pay | Admitting: Family Medicine

## 2017-10-12 ENCOUNTER — Ambulatory Visit (INDEPENDENT_AMBULATORY_CARE_PROVIDER_SITE_OTHER): Payer: Medicare Other | Admitting: Family Medicine

## 2017-10-12 ENCOUNTER — Other Ambulatory Visit: Payer: Self-pay

## 2017-10-12 VITALS — BP 132/78 | HR 65 | Temp 98.5°F | Resp 16 | Ht 63.0 in | Wt 195.0 lb

## 2017-10-12 DIAGNOSIS — E785 Hyperlipidemia, unspecified: Secondary | ICD-10-CM | POA: Diagnosis not present

## 2017-10-12 DIAGNOSIS — I10 Essential (primary) hypertension: Secondary | ICD-10-CM | POA: Diagnosis not present

## 2017-10-12 DIAGNOSIS — N182 Chronic kidney disease, stage 2 (mild): Secondary | ICD-10-CM

## 2017-10-12 DIAGNOSIS — E1122 Type 2 diabetes mellitus with diabetic chronic kidney disease: Secondary | ICD-10-CM | POA: Diagnosis not present

## 2017-10-12 DIAGNOSIS — M79671 Pain in right foot: Secondary | ICD-10-CM | POA: Diagnosis not present

## 2017-10-12 DIAGNOSIS — F419 Anxiety disorder, unspecified: Secondary | ICD-10-CM

## 2017-10-12 DIAGNOSIS — R262 Difficulty in walking, not elsewhere classified: Secondary | ICD-10-CM | POA: Diagnosis not present

## 2017-10-12 DIAGNOSIS — M542 Cervicalgia: Secondary | ICD-10-CM | POA: Diagnosis not present

## 2017-10-12 DIAGNOSIS — M6281 Muscle weakness (generalized): Secondary | ICD-10-CM | POA: Diagnosis not present

## 2017-10-12 MED ORDER — LISINOPRIL 40 MG PO TABS: 40.0000 mg | ORAL_TABLET | Freq: Every day | ORAL | 3 refills | Status: DC

## 2017-10-12 MED ORDER — ATORVASTATIN CALCIUM 40 MG PO TABS: 40.0000 mg | ORAL_TABLET | Freq: Every day | ORAL | 3 refills | Status: DC

## 2017-10-12 MED ORDER — PAROXETINE HCL 40 MG PO TABS: 40.0000 mg | ORAL_TABLET | ORAL | 0 refills | Status: DC

## 2017-10-12 MED ORDER — PAROXETINE HCL 40 MG PO TABS: 20.0000 mg | ORAL_TABLET | Freq: Every evening | ORAL | 1 refills | Status: DC

## 2017-10-12 NOTE — Progress Notes (Signed)
Patient ID: Barbara House, female    DOB: 06-10-1956, 61 y.o.   MRN: [id number]  Chief Complaint  Patient presents with  . Follow-up    Allergies Prednisone  Subjective:   Barbara House is a 61 y.o. female who presents to [facility] today.  HPI [name] presents today for routine follow-up examination for hypertension, diabetes, and anxiety.  She reports she has been taking all her medications as directed.  She takes her blood pressure medications at night because she reports she cannot remember to take them in the morning.  She reports she was recently seen at the orthopedic doctor and her blood pressure was in the 120s over 70s.  She reports her energy level is good.  Denies any shortness of breath, chest pain, or swelling in her extremities. Reports that her mood is good and she is compliant with her Paxil.  She would like a refill on the medication today.  Reports that the medicine helps her not to get so stressed and irritated with her family.  Denies any suicidal or homicidal ideations. Reports that she is taking her medication for her diabetes.  Denies any hypoglycemic episodes.  Reports that she had donuts for breakfast and is not been watching her diet over the holidays.  Reports she did lots of baking and eating.  Is not on a statin medication.  Reports that she thinks she used to be on cholesterol medication but then it got stopped because her cholesterol improved.  She denies any history of myalgias or statin intolerance.  She is not sure whether she has been taking Paxil 20 mg or 40 mg.  She thinks she is just been taking 20 mg a day.  She has had some irritability per her report.  She says she has to try to calm down because her family can get on her nerves and bother her throughout the day.  She reports that they will call her multiple times asking for her to help them complete tasks for them.  She reports she is sleeping well.  Does not feel depressed.   Reports she gets easily flustered and agitated and believes it makes her blood pressure go up.   Diabetes  She presents for her follow-up diabetic visit. She has type 2 diabetes mellitus. No MedicAlert identification noted. Her disease course has been improving. Pertinent negatives for hypoglycemia include no dizziness, hunger, nervousness/anxiousness, sleepiness, sweats or tremors. Pertinent negatives for diabetes include no blurred vision, no chest pain, no fatigue, no foot ulcerations, no polydipsia, no polyphagia, no polyuria, no visual change, no weakness and no weight loss. Symptoms are stable. There are no diabetic complications. Pertinent negatives for diabetic complications include no retinopathy. Risk factors for coronary artery disease include diabetes mellitus, family history, hypertension, stress, sedentary lifestyle and post-menopausal. Her weight is stable. She is following a generally healthy diet. When asked about meal planning, she reported none. She has had a previous visit with a dietitian. She participates in exercise intermittently. An ACE inhibitor/angiotensin II receptor blocker is being taken. She does not see a podiatrist.Eye exam is current.  Anxiety  Presents for follow-up visit. Symptoms include irritability. Patient reports no chest pain, decreased concentration, dizziness, excessive worry, nausea, nervous/anxious behavior, palpitations or shortness of breath. Symptoms occur occasionally. The severity of symptoms is mild. The quality of sleep is good. Nighttime awakenings: occasional.   Compliance with medications is 76-100%.  Hypertension  This is a chronic problem. The current episode  started more than 1 year ago. The problem has been waxing and waning since onset. The problem is controlled. Associated symptoms include anxiety. Pertinent negatives include no blurred vision, chest pain, neck pain, orthopnea, palpitations, peripheral edema, PND, shortness of breath or sweats.  There are no associated agents to hypertension. Risk factors for coronary artery disease include diabetes mellitus, obesity, stress and sedentary lifestyle. Past treatments include ACE inhibitors, diuretics and calcium channel blockers. The current treatment provides significant improvement. Compliance problems include diet and exercise.  There is no history of kidney disease, heart failure or retinopathy.    Past Medical History:  Diagnosis Date  . Allergic rhinitis   . Anxiety disorder   . GERD (gastroesophageal reflux disease)   . History of migraine   . Hypertension   . Type 2 diabetes mellitus ([place])    Not sure when she was diagnosed, greater than five years.     Past Surgical History:  Procedure Laterality Date  . ABDOMINAL HYSTERECTOMY     has ovaries.   [name] CARDIAC CATHETERIZATION N/A 08/03/2016   Procedure: Left Heart Cath and Coronary Angiography;  Surgeon: Peter M Martinique, MD;  Location: [facility];  Service: Cardiovascular;  Laterality: N/A;  . Miinor skin surgery      Family History  Problem Relation Age of Onset  . Hypertension Mother   . Diabetes Mother   . Hypertension Son   . Diabetes Son   . Cancer Father   . Thyroid cancer Sister   . Cirrhosis Brother   . Alcohol abuse Brother      Social History   Socioeconomic History  . Marital status: Single    Spouse name: None  . Number of children: None  . Years of education: None  . Highest education level: None  Social Needs  . Financial resource strain: None  . Food insecurity - worry: None  . Food insecurity - inability: None  . Transportation needs - medical: None  . Transportation needs - non-medical: None  Occupational History  . None  Tobacco Use  . Smoking status: Never Smoker  . Smokeless tobacco: Never Used  Substance and Sexual Activity  . Alcohol use: No    Alcohol/week: 0.0 oz  . Drug use: No  . Sexual activity: None  Other Topics Concern  . None  Social History Narrative    Reports is on disability. Has one son. Lives in [place]. Enjoys time with family. Enjoys Firefighter. Attends church.    Current Outpatient Medications on File Prior to Visit  Medication Sig Dispense Refill  . aspirin EC 81 MG tablet Take 81 mg by mouth daily.    [name] augmented betamethasone dipropionate (DIPROLENE-AF) 0.05 % cream Apply 1 application topically 2 (two) times daily as needed (for itching).    . Azelastine HCl 0.15 % SOLN Place 1 spray into the nose at bedtime as needed. 30 mL 5  . canagliflozin (INVOKANA) 300 MG TABS tablet Take 1 tablet (300 mg total) daily by mouth. 30 tablet 3  . cholecalciferol (VITAMIN D) 1000 units tablet Take 1,000 Units by mouth daily.    . cycloSPORINE (RESTASIS) 0.05 % ophthalmic emulsion Place 1 drop into both eyes 2 (two) times daily as needed (for eye dryness.).    [name] fluticasone (FLONASE) 50 MCG/ACT nasal spray Place 1-2 sprays into both nostrils daily as needed (for sinus issues.). 16 g 5  . glipiZIDE (GLUCOTROL XL) 10 MG 24 hr tablet Take 1 tablet (10 mg total) by  mouth daily with breakfast. 30 tablet 1  . hydrochlorothiazide (HYDRODIURIL) 25 MG tablet Take 1 tablet (25 mg total) by mouth daily. 90 tablet 0  . hydrocortisone 2.5 % ointment Apply topically 2 (two) times daily.    [name] levocetirizine (XYZAL) 5 MG tablet Take 1 tablet (5 mg total) by mouth every evening. 30 tablet 3  . metFORMIN (GLUCOPHAGE) 1000 MG tablet Take 1 tablet (1,000 mg total) by mouth 2 (two) times daily. 60 tablet 0  . montelukast (SINGULAIR) 10 MG tablet TAKE 1 TABLET (10 MG TOTAL) BY MOUTH AT BEDTIME AS NEEDED (FOR ALLERGIES.). 30 tablet 3  . Olopatadine HCl (PATANASE) 0.6 % SOLN Use 1-2 sprays each nostril daily 30.5 g 3  . traZODone (DESYREL) 50 MG tablet Take 50-100 mg by mouth at bedtime as needed for sleep.    [name] UNABLE TO FIND Diabetic shoe 1 each 0  . verapamil (CALAN-SR) 240 MG CR tablet Take 1 tablet (240 mg total) by mouth every evening. 30 tablet 0  . Dexlansoprazole  (DEXILANT) 30 MG capsule Take 1 capsule (30 mg total) daily by mouth. (Patient not taking: Reported on 10/12/2017) 90 capsule 1  . nabumetone (RELAFEN) 750 MG tablet TAKE ONE TABLET BY MOUTH TWICE DAILY AS DIRECTED.  5  . ranitidine (ZANTAC) 150 MG tablet Take 150 mg by mouth 2 (two) times daily.  0   No current facility-administered medications on file prior to visit.     Review of Systems  Constitutional: Positive for irritability. Negative for appetite change, diaphoresis, fatigue, fever, unexpected weight change and weight loss.  HENT: Negative for trouble swallowing.   Eyes: Negative for blurred vision.  Respiratory: Negative for cough and shortness of breath.   Cardiovascular: Negative for chest pain, palpitations, orthopnea, leg swelling and PND.  Gastrointestinal: Negative for abdominal pain, anal bleeding (ankle pain is better), diarrhea and nausea.  Endocrine: Negative for polydipsia, polyphagia and polyuria.  Genitourinary: Negative for difficulty urinating, dysuria, hematuria and urgency.  Musculoskeletal: Negative for gait problem, neck pain and neck stiffness.  Skin: Negative for rash.  Neurological: Negative for dizziness, tremors and weakness.  Psychiatric/Behavioral: Negative for agitation, decreased concentration and dysphoric mood. The patient is not nervous/anxious.        Paxil helps with mood. Does not feel like gets as worked up.      Objective:   BP 132/78 (BP Location: Left Arm, Patient Position: Sitting, Cuff Size: Normal)   Pulse 65   Temp 98.5 F (36.9 C) (Other (Comment))   Resp 16   Ht 5\' 3"  (1.6 m)   Wt 195 lb (88.5 kg)   SpO2 99%   BMI 34.54 kg/m   Physical Exam  Constitutional: She appears well-developed and well-nourished.  HENT:  Head: Normocephalic and atraumatic.  Neck: Normal range of motion. Neck supple.  Cardiovascular: Normal rate, regular rhythm and normal heart sounds.  Pulmonary/Chest: Effort normal and breath sounds normal.    Skin: Skin is warm.  Psychiatric: Her behavior is normal. Judgment and thought content normal. Her mood appears anxious. Her affect is labile. She is not actively hallucinating. Cognition and memory are normal.  Gets easily excited and speaks in loud voice. Easy to be redirected. Thoughts goal directed.  She is attentive.  Vitals reviewed.    Assessment and Plan  1. HTN, goal below 130/80 Lifestyle modifications discussed with patient including a diet emphasizing vegetables, fruits, and whole grains. Limiting intake of sodium to less than 2,400 mg per day.  Recommendations  discussed include consuming low-fat dairy products, poultry, fish, legumes, non-tropical vegetable oils, and nuts; and limiting intake of sweets, sugar-sweetened beverages, and red meat. Discussed following a plan such as the Dietary Approaches to Stop Hypertension (DASH) diet. Patient to read up on this diet.   - lisinopril (PRINIVIL,ZESTRIL) 40 MG tablet; Take 1 tablet (40 mg total) by mouth daily.  Dispense: 90 tablet; Refill: 3 - Basic metabolic panel -recheck at follow up.  2. Type 2 diabetes mellitus with stage 2 chronic kidney disease, without long-term current use of insulin (HCC) Check labs and increase medication if needed.  - Hemoglobin A1c - atorvastatin (LIPITOR) 40 MG tablet; Take 1 tablet (40 mg total) by mouth daily.  Dispense: 90 tablet; Refill: 3  3. Anxiety  Patient does not have/denies the following risks: previous suicide attempts, family history of suicide, access to lethal means, prior history of psychiatric disorder, history of alcohol or substance abuse disorder, recent loss of a loved one, or severe hopelessness. Patient denies access to firearms or if present will have removed from home/access.   Patient has protective factors of family and community support.  Patient reports that family believes is behaving rationally. Patient displays problem solving skills.   Patient specifically denies  suicide ideation. Patient has access/information to healthcare contacts if situation or mood changes where patient is a risk to self or others or mood becomes unstable.   During the encounter, the patient had good eye contact and firm handshake regarding safety contract and agreement to seek help if mood worsens and not to harm self.   Patient understands the treatment plan and is in agreement. Agrees to keep follow up and call prior or return to clinic if needed.    Relaxation techniques discussed with patient. 4. Hyperlipidemia LDL goal <70 Hyperlipidemia and the associated risk of ASCVD were discussed today. Primary vs. Secondary prevention of ASCVD were discussed and how it relates to patient morbidity, mortality, and quality of life. Shared decision making with patient including the risks of statins vs.benefits of ASCVD risk reduction discussed.  Risks of stains discussed including myopathy, rhabdomyoloysis, liver problems, increased risk of diabetes discussed. We discussed heart healthy diet, lifestyle modifications, risk factor modifications, and adherence to the recommended treatment plan. We discussed the need to periodically monitor lipid panel and liver function tests while on statin therapy.    Return in about 3 months (around 01/10/2018) for follow up. Caren Macadam, MD 10/12/2017

## 2017-10-12 NOTE — Patient Instructions (Signed)
Cholesterol Cholesterol is a fat. Your body needs a small amount of cholesterol. Cholesterol (plaque) may build up in your blood vessels (arteries). That makes you more likely to have a heart attack or stroke. You cannot feel your cholesterol level. Having a blood test is the only way to find out if your level is high. Keep your test results. Work with your doctor to keep your cholesterol at a good level. What do the results mean?  Total cholesterol is how much cholesterol is in your blood.  LDL is bad cholesterol. This is the type that can build up. Try to have low LDL.  HDL is good cholesterol. It cleans your blood vessels and carries LDL away. Try to have high HDL.  Triglycerides are fat that the body can store or burn for energy. What are good levels of cholesterol?  Total cholesterol below 200.  LDL below 100 is good for people who have health risks. LDL below 70 is good for people who have very high risks.  HDL above 40 is good. It is best to have HDL of 60 or higher.  Triglycerides below 150. How can I lower my cholesterol? Diet Follow your diet program as told by your doctor.  Choose fish, white meat chicken, or turkey that is roasted or baked. Try not to eat red meat, fried foods, sausage, or lunch meats.  Eat lots of fresh fruits and vegetables.  Choose whole grains, beans, pasta, potatoes, and cereals.  Choose olive oil, corn oil, or canola oil. Only use small amounts.  Try not to eat butter, mayonnaise, shortening, or palm kernel oils.  Try not to eat foods with trans fats.  Choose low-fat or nonfat dairy foods. ? Drink skim or nonfat milk. ? Eat low-fat or nonfat yogurt and cheeses. ? Try not to drink whole milk or cream. ? Try not to eat ice cream, egg yolks, or full-fat cheeses.  Healthy desserts include angel food cake, ginger snaps, animal crackers, hard candy, popsicles, and low-fat or nonfat frozen yogurt. Try not to eat pastries, cakes, pies, and  cookies.  Exercise Follow your exercise program as told by your doctor.  Be more active. Try gardening, walking, and taking the stairs.  Ask your doctor about ways that you can be more active.  Medicine  Take over-the-counter and prescription medicines only as told by your doctor. This information is not intended to replace advice given to you by your health care provider. Make sure you discuss any questions you have with your health care provider. Document Released: 12/29/2008 Document Revised: 05/03/2016 Document Reviewed: 04/13/2016 Elsevier Interactive Patient Education  2018 [employer].  

## 2017-10-13 LAB — HEMOGLOBIN A1C
Hgb A1c MFr Bld: 7.5 % of total Hgb — ABNORMAL HIGH (ref <5.7)
Mean Plasma Glucose: 169 (calc)
eAG (mmol/L): 9.3 (calc)

## 2017-10-13 LAB — BASIC METABOLIC PANEL
BUN: 21 mg/dL (ref 7–25)
CALCIUM: 10.1 mg/dL (ref 8.6–10.4)
CO2: 26 mmol/L (ref 20–32)
Chloride: 105 mmol/L (ref 98–110)
Creat: 0.9 mg/dL (ref 0.50–1.05)
GLUCOSE: 180 mg/dL — AB (ref 65–139)
Potassium: 4.5 mmol/L (ref 3.5–5.3)
SODIUM: 141 mmol/L (ref 135–146)

## 2017-10-17 DIAGNOSIS — M79671 Pain in right foot: Secondary | ICD-10-CM | POA: Diagnosis not present

## 2017-10-17 DIAGNOSIS — M6281 Muscle weakness (generalized): Secondary | ICD-10-CM | POA: Diagnosis not present

## 2017-10-17 DIAGNOSIS — R262 Difficulty in walking, not elsewhere classified: Secondary | ICD-10-CM | POA: Diagnosis not present

## 2017-10-17 DIAGNOSIS — M542 Cervicalgia: Secondary | ICD-10-CM | POA: Diagnosis not present

## 2017-10-18 ENCOUNTER — Telehealth: Payer: Self-pay | Admitting: Family Medicine

## 2017-10-18 DIAGNOSIS — E1122 Type 2 diabetes mellitus with diabetic chronic kidney disease: Secondary | ICD-10-CM

## 2017-10-18 DIAGNOSIS — N182 Chronic kidney disease, stage 2 (mild): Principal | ICD-10-CM

## 2017-10-18 NOTE — Telephone Encounter (Signed)
Called patient regarding message below. No answer, left generic message for patient to return call.   

## 2017-10-18 NOTE — Telephone Encounter (Signed)
Please call patient and advised that her blood sugars are slightly higher than they were 3 months ago.  I was hoping that her sugars would be better controlled.  Please ask if she would be willing to go to diabetic education and nutrition counseling.  Advised her that the next step to get her blood sugars controlled if she cannot make changes in her diet and exercise would be for her to start insulin.  Please advised that I will place referral for diabetic nutrition if she would be willing to go and make these changes to improve her blood sugar control.

## 2017-10-19 DIAGNOSIS — M6281 Muscle weakness (generalized): Secondary | ICD-10-CM | POA: Diagnosis not present

## 2017-10-19 DIAGNOSIS — R262 Difficulty in walking, not elsewhere classified: Secondary | ICD-10-CM | POA: Diagnosis not present

## 2017-10-19 DIAGNOSIS — M79671 Pain in right foot: Secondary | ICD-10-CM | POA: Diagnosis not present

## 2017-10-19 DIAGNOSIS — M542 Cervicalgia: Secondary | ICD-10-CM | POA: Diagnosis not present

## 2017-10-19 NOTE — Telephone Encounter (Signed)
Called patient regarding message below. No answer, left generic message for patient to return call.   

## 2017-10-19 NOTE — Telephone Encounter (Signed)
Please attempt to call patient back. You do not have to send back to me until you have contacted patient. Barbara House. Mannie Stabile, MD

## 2017-10-22 NOTE — Telephone Encounter (Signed)
Patient informed of message below, verbalized understanding. She refuses both insulin and diabetic education. She states that her A1C was only high because it was during the holidays. She states she wants to try to do it on her on first. She wants to work on her blood sugars from now until her appointment at the end of March and see then how well she is doing, then discuss options.

## 2017-10-22 NOTE — Telephone Encounter (Signed)
I advised the patient that her Sugar was High, per the note from Dr Mannie Stabile, she wants the nurse to call her --she has questions, and she never said yes or No to classes   [phone]

## 2017-10-23 DIAGNOSIS — M79671 Pain in right foot: Secondary | ICD-10-CM | POA: Diagnosis not present

## 2017-10-23 DIAGNOSIS — M6281 Muscle weakness (generalized): Secondary | ICD-10-CM | POA: Diagnosis not present

## 2017-10-23 DIAGNOSIS — R262 Difficulty in walking, not elsewhere classified: Secondary | ICD-10-CM | POA: Diagnosis not present

## 2017-10-23 DIAGNOSIS — M542 Cervicalgia: Secondary | ICD-10-CM | POA: Diagnosis not present

## 2017-10-23 NOTE — Telephone Encounter (Signed)
Called patient regarding message below. No answer, left generic message for patient to return call.   

## 2017-10-23 NOTE — Telephone Encounter (Signed)
Okay.  Please asked patient to keep her scheduled follow-up and come to the lab before her visit to get her hemoglobin A1c and labs done.  Please place order for hemoglobin A1c, BMP.

## 2017-10-25 DIAGNOSIS — M6281 Muscle weakness (generalized): Secondary | ICD-10-CM | POA: Diagnosis not present

## 2017-10-25 DIAGNOSIS — R262 Difficulty in walking, not elsewhere classified: Secondary | ICD-10-CM | POA: Diagnosis not present

## 2017-10-25 DIAGNOSIS — M542 Cervicalgia: Secondary | ICD-10-CM | POA: Diagnosis not present

## 2017-10-25 DIAGNOSIS — M79671 Pain in right foot: Secondary | ICD-10-CM | POA: Diagnosis not present

## 2017-10-25 NOTE — Telephone Encounter (Signed)
Patient informed of message below, verbalized understanding.  

## 2017-10-30 DIAGNOSIS — R262 Difficulty in walking, not elsewhere classified: Secondary | ICD-10-CM | POA: Diagnosis not present

## 2017-10-30 DIAGNOSIS — M6281 Muscle weakness (generalized): Secondary | ICD-10-CM | POA: Diagnosis not present

## 2017-10-30 DIAGNOSIS — M542 Cervicalgia: Secondary | ICD-10-CM | POA: Diagnosis not present

## 2017-10-30 DIAGNOSIS — M79671 Pain in right foot: Secondary | ICD-10-CM | POA: Diagnosis not present

## 2017-11-01 DIAGNOSIS — M79671 Pain in right foot: Secondary | ICD-10-CM | POA: Diagnosis not present

## 2017-11-01 DIAGNOSIS — R262 Difficulty in walking, not elsewhere classified: Secondary | ICD-10-CM | POA: Diagnosis not present

## 2017-11-01 DIAGNOSIS — M6281 Muscle weakness (generalized): Secondary | ICD-10-CM | POA: Diagnosis not present

## 2017-11-01 DIAGNOSIS — M542 Cervicalgia: Secondary | ICD-10-CM | POA: Diagnosis not present

## 2017-11-06 ENCOUNTER — Ambulatory Visit: Payer: Medicare Other | Admitting: Family Medicine

## 2017-11-06 DIAGNOSIS — E119 Type 2 diabetes mellitus without complications: Secondary | ICD-10-CM | POA: Diagnosis not present

## 2017-11-06 DIAGNOSIS — K2970 Gastritis, unspecified, without bleeding: Secondary | ICD-10-CM | POA: Diagnosis not present

## 2017-11-06 DIAGNOSIS — Z79899 Other long term (current) drug therapy: Secondary | ICD-10-CM | POA: Diagnosis not present

## 2017-11-06 DIAGNOSIS — K219 Gastro-esophageal reflux disease without esophagitis: Secondary | ICD-10-CM | POA: Diagnosis not present

## 2017-11-06 DIAGNOSIS — I10 Essential (primary) hypertension: Secondary | ICD-10-CM | POA: Diagnosis not present

## 2017-11-06 DIAGNOSIS — Z7982 Long term (current) use of aspirin: Secondary | ICD-10-CM | POA: Diagnosis not present

## 2017-11-06 DIAGNOSIS — F419 Anxiety disorder, unspecified: Secondary | ICD-10-CM | POA: Diagnosis not present

## 2017-11-06 DIAGNOSIS — Z7984 Long term (current) use of oral hypoglycemic drugs: Secondary | ICD-10-CM | POA: Diagnosis not present

## 2017-11-06 DIAGNOSIS — N3000 Acute cystitis without hematuria: Secondary | ICD-10-CM | POA: Diagnosis not present

## 2017-11-07 ENCOUNTER — Ambulatory Visit: Payer: Medicare Other | Admitting: Gastroenterology

## 2017-11-07 ENCOUNTER — Ambulatory Visit: Payer: Medicare Other | Admitting: Family Medicine

## 2017-11-07 ENCOUNTER — Other Ambulatory Visit: Payer: Self-pay | Admitting: Family Medicine

## 2017-11-07 DIAGNOSIS — N182 Chronic kidney disease, stage 2 (mild): Principal | ICD-10-CM

## 2017-11-07 DIAGNOSIS — E1122 Type 2 diabetes mellitus with diabetic chronic kidney disease: Secondary | ICD-10-CM

## 2017-11-13 DIAGNOSIS — R262 Difficulty in walking, not elsewhere classified: Secondary | ICD-10-CM | POA: Diagnosis not present

## 2017-11-13 DIAGNOSIS — M542 Cervicalgia: Secondary | ICD-10-CM | POA: Diagnosis not present

## 2017-11-13 DIAGNOSIS — M79671 Pain in right foot: Secondary | ICD-10-CM | POA: Diagnosis not present

## 2017-11-13 DIAGNOSIS — M6281 Muscle weakness (generalized): Secondary | ICD-10-CM | POA: Diagnosis not present

## 2017-11-15 DIAGNOSIS — M79671 Pain in right foot: Secondary | ICD-10-CM | POA: Diagnosis not present

## 2017-11-15 DIAGNOSIS — M542 Cervicalgia: Secondary | ICD-10-CM | POA: Diagnosis not present

## 2017-11-15 DIAGNOSIS — M6281 Muscle weakness (generalized): Secondary | ICD-10-CM | POA: Diagnosis not present

## 2017-11-15 DIAGNOSIS — R262 Difficulty in walking, not elsewhere classified: Secondary | ICD-10-CM | POA: Diagnosis not present

## 2017-11-19 ENCOUNTER — Ambulatory Visit: Payer: Medicare Other | Admitting: Family Medicine

## 2017-11-19 ENCOUNTER — Telehealth: Payer: Self-pay | Admitting: Family Medicine

## 2017-11-19 NOTE — Telephone Encounter (Signed)
I called and set up a time for 2-14 @ 8:09 for transportation to the 9:00 appt,, Conf [phone]

## 2017-11-20 DIAGNOSIS — M542 Cervicalgia: Secondary | ICD-10-CM | POA: Diagnosis not present

## 2017-11-20 DIAGNOSIS — R262 Difficulty in walking, not elsewhere classified: Secondary | ICD-10-CM | POA: Diagnosis not present

## 2017-11-20 DIAGNOSIS — M79671 Pain in right foot: Secondary | ICD-10-CM | POA: Diagnosis not present

## 2017-11-20 DIAGNOSIS — M6281 Muscle weakness (generalized): Secondary | ICD-10-CM | POA: Diagnosis not present

## 2017-11-22 DIAGNOSIS — R262 Difficulty in walking, not elsewhere classified: Secondary | ICD-10-CM | POA: Diagnosis not present

## 2017-11-22 DIAGNOSIS — M79671 Pain in right foot: Secondary | ICD-10-CM | POA: Diagnosis not present

## 2017-11-22 DIAGNOSIS — M542 Cervicalgia: Secondary | ICD-10-CM | POA: Diagnosis not present

## 2017-11-22 DIAGNOSIS — M6281 Muscle weakness (generalized): Secondary | ICD-10-CM | POA: Diagnosis not present

## 2017-11-26 ENCOUNTER — Other Ambulatory Visit: Payer: Self-pay | Admitting: Family Medicine

## 2017-11-27 DIAGNOSIS — R262 Difficulty in walking, not elsewhere classified: Secondary | ICD-10-CM | POA: Diagnosis not present

## 2017-11-27 DIAGNOSIS — M542 Cervicalgia: Secondary | ICD-10-CM | POA: Diagnosis not present

## 2017-11-27 DIAGNOSIS — M79671 Pain in right foot: Secondary | ICD-10-CM | POA: Diagnosis not present

## 2017-11-27 DIAGNOSIS — M6281 Muscle weakness (generalized): Secondary | ICD-10-CM | POA: Diagnosis not present

## 2017-11-29 ENCOUNTER — Ambulatory Visit (INDEPENDENT_AMBULATORY_CARE_PROVIDER_SITE_OTHER): Payer: Medicare Other | Admitting: Family Medicine

## 2017-11-29 ENCOUNTER — Other Ambulatory Visit: Payer: Self-pay

## 2017-11-29 ENCOUNTER — Ambulatory Visit: Payer: Medicare Other | Admitting: Family Medicine

## 2017-11-29 VITALS — BP 126/78 | HR 73 | Temp 98.7°F | Resp 16 | Ht 63.0 in | Wt 187.5 lb

## 2017-11-29 DIAGNOSIS — N182 Chronic kidney disease, stage 2 (mild): Secondary | ICD-10-CM | POA: Diagnosis not present

## 2017-11-29 DIAGNOSIS — E1122 Type 2 diabetes mellitus with diabetic chronic kidney disease: Secondary | ICD-10-CM

## 2017-11-29 DIAGNOSIS — M542 Cervicalgia: Secondary | ICD-10-CM | POA: Diagnosis not present

## 2017-11-29 DIAGNOSIS — R590 Localized enlarged lymph nodes: Secondary | ICD-10-CM

## 2017-11-29 DIAGNOSIS — E785 Hyperlipidemia, unspecified: Secondary | ICD-10-CM

## 2017-11-29 DIAGNOSIS — I10 Essential (primary) hypertension: Secondary | ICD-10-CM | POA: Diagnosis not present

## 2017-11-29 DIAGNOSIS — R1013 Epigastric pain: Secondary | ICD-10-CM | POA: Diagnosis not present

## 2017-11-29 DIAGNOSIS — R262 Difficulty in walking, not elsewhere classified: Secondary | ICD-10-CM | POA: Diagnosis not present

## 2017-11-29 DIAGNOSIS — M79671 Pain in right foot: Secondary | ICD-10-CM | POA: Diagnosis not present

## 2017-11-29 DIAGNOSIS — M6281 Muscle weakness (generalized): Secondary | ICD-10-CM | POA: Diagnosis not present

## 2017-11-29 LAB — BASIC METABOLIC PANEL
BUN: 20 mg/dL (ref 7–25)
CO2: 30 mmol/L (ref 20–32)
CREATININE: 1.02 mg/dL (ref 0.50–1.05)
Calcium: 10.3 mg/dL (ref 8.6–10.4)
Chloride: 104 mmol/L (ref 98–110)
GLUCOSE: 157 mg/dL — AB (ref 65–139)
Potassium: 4.2 mmol/L (ref 3.5–5.3)
Sodium: 143 mmol/L (ref 135–146)

## 2017-11-29 LAB — LIPID PANEL
CHOLESTEROL: 122 mg/dL (ref <200)
HDL: 50 mg/dL — AB (ref >50)
LDL Cholesterol (Calc): 58 mg/dL (calc)
Non-HDL Cholesterol (Calc): 72 mg/dL (calc) (ref <130)
TRIGLYCERIDES: 68 mg/dL (ref <150)
Total CHOL/HDL Ratio: 2.4 (calc) (ref <5.0)

## 2017-11-29 MED ORDER — LINAGLIPTIN 5 MG PO TABS: 5.0000 mg | ORAL_TABLET | Freq: Every day | ORAL | 2 refills | Status: DC

## 2017-11-29 MED ORDER — DEXLANSOPRAZOLE 30 MG PO CPDR
1.0000 | DELAYED_RELEASE_CAPSULE | Freq: Every day | ORAL | 1 refills | Status: DC
Start: 2017-11-29 — End: 2018-02-20

## 2017-11-29 NOTE — Progress Notes (Signed)
Patient ID: Barbara House, female    DOB: [birth year], 62 y.o.   MRN: [id number]  Chief Complaint  Patient presents with  . Follow-up    Allergies Prednisone  Subjective:   Barbara House is a 62 y.o. female who presents to [facility] today.  HPI Ms. Millirons presents today for follow-up.  She reports that she has been doing well and her blood sugars are running well.  She reports that her insurance does not want to pay for the [place] but they have approved it.  She denies any hypoglycemic episodes.  She is not checking her blood sugars on a regular basis.  She denies any lesions on her feet. She reports that her blood pressure is running well.  Denies any, chest pain shortness of breath, or headaches.  She denies any swelling in her extremities.  She reports that she is still having episodes where her food will not go down and it gets stuck in her throat.  She reports that she feels like she has to vomit it up.  She is taking the acid medication for her stomach that was prescribed by the ear nose and throat.  She reports that her voice is always sounded the way it does now.  She denies any tobacco, alcohol, or drug use.  She does have an upcoming appointment with GI in the next several weeks.  She reports that while taking a shower she noticed a lump under her right arm.  She reports is been there for several months.  She reports that she thinks she noticed it when the weather turned cold.  She reports that the area under her arm has not gotten red or drained any fluid.  She reports no discrete skin changes or boils.  She denies any tenderness in her breast.  She does report that the area under her arm is sore at times and she notices it because of the pain.  She does not perform monthly self breast exams.  She denies any skin changes or nipple discharge.  She reports she has never had an abnormal mammogram.  She is due for mammogram soon.       Past Medical History:   Diagnosis Date  . Allergic rhinitis   . Anxiety disorder   . GERD (gastroesophageal reflux disease)   . History of migraine   . Hypertension   . Type 2 diabetes mellitus ([place])    Not sure when she was diagnosed, greater than five years.     Past Surgical History:  Procedure Laterality Date  . ABDOMINAL HYSTERECTOMY     has ovaries.   [name] CARDIAC CATHETERIZATION N/A 08/03/2016   Procedure: Left Heart Cath and Coronary Angiography;  Surgeon: Peter M Martinique, MD;  Location: [place] [facility];  Service: Cardiovascular;  Laterality: N/A;  . Miinor skin surgery      Family History  Problem Relation Age of Onset  . Hypertension Mother   . Diabetes Mother   . Hypertension Son   . Diabetes Son   . Cancer Father   . Thyroid cancer Sister   . Cirrhosis Brother   . Alcohol abuse Brother      Social History   Socioeconomic History  . Marital status: Single    Spouse name: Not on file  . Number of children: Not on file  . Years of education: Not on file  . Highest education level: Not on file  Social Needs  . Emergency planning/management officer  strain: Not on file  . Food insecurity - worry: Not on file  . Food insecurity - inability: Not on file  . Transportation needs - medical: Not on file  . Transportation needs - non-medical: Not on file  Occupational History  . Not on file  Tobacco Use  . Smoking status: Never Smoker  . Smokeless tobacco: Never Used  Substance and Sexual Activity  . Alcohol use: No    Alcohol/week: 0.0 oz  . Drug use: No  . Sexual activity: Not on file  Other Topics Concern  . Not on file  Social History Narrative   Reports is on disability. Has one son. Lives in [place]. Enjoys time with family. Enjoys Firefighter. Attends church.    Current Outpatient Medications on File Prior to Visit  Medication Sig Dispense Refill  . aspirin EC 81 MG tablet Take 81 mg by mouth daily.    [name] atorvastatin (LIPITOR) 40 MG tablet Take 1 tablet (40 mg total) by mouth daily. 90  tablet 3  . augmented betamethasone dipropionate (DIPROLENE-AF) 0.05 % cream Apply 1 application topically 2 (two) times daily as needed (for itching).    . Azelastine HCl 0.15 % SOLN Place 1 spray into the nose at bedtime as needed. 30 mL 5  . cholecalciferol (VITAMIN D) 1000 units tablet Take 1,000 Units by mouth daily.    . cycloSPORINE (RESTASIS) 0.05 % ophthalmic emulsion Place 1 drop into both eyes 2 (two) times daily as needed (for eye dryness.).    [name] fluticasone (FLONASE) 50 MCG/ACT nasal spray Place 1-2 sprays into both nostrils daily as needed (for sinus issues.). 16 g 5  . glipiZIDE (GLUCOTROL XL) 10 MG 24 hr tablet TAKE ONE TABLET BY MOUTH DAILY. TAKE WITH BREAKFAST. 30 tablet 1  . hydrochlorothiazide (HYDRODIURIL) 25 MG tablet Take 1 tablet (25 mg total) by mouth daily. 90 tablet 0  . hydrocortisone 2.5 % ointment Apply topically 2 (two) times daily.    [name] levocetirizine (XYZAL) 5 MG tablet Take 1 tablet (5 mg total) by mouth every evening. 30 tablet 3  . lisinopril (PRINIVIL,ZESTRIL) 40 MG tablet Take 1 tablet (40 mg total) by mouth daily. 90 tablet 3  . metFORMIN (GLUCOPHAGE) 1000 MG tablet Take 1 tablet (1,000 mg total) by mouth 2 (two) times daily. 60 tablet 0  . montelukast (SINGULAIR) 10 MG tablet TAKE 1 TABLET (10 MG TOTAL) BY MOUTH AT BEDTIME AS NEEDED (FOR ALLERGIES.). 30 tablet 3  . nabumetone (RELAFEN) 750 MG tablet TAKE ONE TABLET BY MOUTH TWICE DAILY AS DIRECTED.  5  . Olopatadine HCl (PATANASE) 0.6 % SOLN Use 1-2 sprays each nostril daily 30.5 g 3  . PARoxetine (PAXIL) 40 MG tablet Take 1 tablet (40 mg total) by mouth every morning. 90 tablet 0  . ranitidine (ZANTAC) 150 MG tablet Take 150 mg by mouth 2 (two) times daily.  0  . traZODone (DESYREL) 50 MG tablet Take 50-100 mg by mouth at bedtime as needed for sleep.    [name] UNABLE TO FIND Diabetic shoe 1 each 0  . verapamil (CALAN-SR) 240 MG CR tablet TAKE ONE TABLET BY MOUTH IN THE EVENING 30 tablet 0   No current  facility-administered medications on file prior to visit.     Review of Systems   Objective:   BP 126/78 (BP Location: Left Arm, Patient Position: Sitting, Cuff Size: Normal)   Pulse 73   Temp 98.7 F (37.1 C) (Temporal)   Resp 16   Ht 5'  3" (1.6 m)   Wt 187 lb 8 oz (85 kg)   SpO2 96%   BMI 33.21 kg/m   Physical Exam Vital signs reviewed today.  Heart regular rate and rhythm.  Lungs clear to auscultation bilaterally.  No cervical lymphadenopathy present.  Right-sided axillary lymphadenopathy present, approximately 3 cm area and right axilla, tender to palpation.  No overlying skin erythema or skin changes.  Breasts are very large bilaterally, pendulous.  No appreciable mass bilaterally.  No nipple discharge.  No overlying skin abnormalities in breast bilaterally.  Extremities with no clubbing, cyanosis, or edema.  Abdomen is obese, soft, nondistended and nontender.  Voice is very raspy and loud.  Mood euthymic.  Affect congruent with mood.  Assessment and Plan  1. Axillary lymphadenopathy Discussed with patient that at this time will order diagnostic mammogram and ultrasound.  Pending mammogram will consider CT scan to evaluate for lymphadenopathy if it has not resolved at follow-up.  We will plan to check in 2-4 weeks.  Exam is somewhat difficult secondary to body habitus and size of breasts.  She was counseled concerning any worrisome signs and symptoms and told her that develop to please call or return to clinic.  2. Essential hypertension Blood pressure under good control now after increase of lisinopril dose.  Secondary to increase of lisinopril at last visit we will need to check BMP today for potassium and creatinine. - Basic metabolic panel  3. Hyperlipidemia LDL goal <70 Check FLP at this time.  Continue diet, exercise, and weight loss recommendations.  Continue statin medication.  If LDL is not to goal we will plan to increase. - Lipid panel  4. Type 2 diabetes mellitus  with stage 2 chronic kidney disease, without long-term current use of insulin ([place]) Had discussion with patient today regarding black box warning and specifically risk of cancer with use of Invokana.  At this time will discontinue this medication.  We will start Tradjenta, 1 p.o. daily.  We will plan to recheck labs in 3 months.  Patient will continue to work on diet, exercise, and monitor blood sugars.  She will call with any problems or hypoglycemic episodes. - linagliptin (TRADJENTA) 5 MG TABS tablet; Take 1 tablet (5 mg total) by mouth daily.  Dispense: 30 tablet; Refill: 2  5. Dyspepsia Patient with dyspepsia and dysphasia symptoms.  Patient was counseled concerning types of food that she should eat at this time.  She was told to avoid steak, meats and foods that can tend to get caught in her esophagus.  She was told to make sure she chews well and drinks liquids if needed.  She does report some difficulty swallowing liquids also.  She does have an upcoming appointment with GI.  She is to continue the [place] at this time.  - Dexlansoprazole (DEXILANT) 30 MG capsule; Take 1 capsule (30 mg total) by mouth daily.  Dispense: 90 capsule; Refill: 1  Return in about 3 months (around 02/26/2018) for follow up. Caren Macadam, MD 11/30/2017

## 2017-11-30 ENCOUNTER — Encounter: Payer: Self-pay | Admitting: Family Medicine

## 2017-12-05 ENCOUNTER — Encounter: Payer: Self-pay | Admitting: Family Medicine

## 2017-12-05 DIAGNOSIS — M542 Cervicalgia: Secondary | ICD-10-CM | POA: Diagnosis not present

## 2017-12-05 DIAGNOSIS — M6281 Muscle weakness (generalized): Secondary | ICD-10-CM | POA: Diagnosis not present

## 2017-12-05 DIAGNOSIS — R262 Difficulty in walking, not elsewhere classified: Secondary | ICD-10-CM | POA: Diagnosis not present

## 2017-12-05 DIAGNOSIS — M79671 Pain in right foot: Secondary | ICD-10-CM | POA: Diagnosis not present

## 2017-12-08 ENCOUNTER — Other Ambulatory Visit: Payer: Self-pay | Admitting: Family Medicine

## 2017-12-08 ENCOUNTER — Other Ambulatory Visit: Payer: Self-pay | Admitting: Allergy & Immunology

## 2017-12-08 DIAGNOSIS — I10 Essential (primary) hypertension: Secondary | ICD-10-CM

## 2017-12-13 ENCOUNTER — Ambulatory Visit: Payer: Medicare Other | Admitting: Gastroenterology

## 2018-01-01 ENCOUNTER — Other Ambulatory Visit: Payer: Self-pay | Admitting: Family Medicine

## 2018-01-01 DIAGNOSIS — N182 Chronic kidney disease, stage 2 (mild): Principal | ICD-10-CM

## 2018-01-01 DIAGNOSIS — L602 Onychogryphosis: Secondary | ICD-10-CM | POA: Diagnosis not present

## 2018-01-01 DIAGNOSIS — E1149 Type 2 diabetes mellitus with other diabetic neurological complication: Secondary | ICD-10-CM | POA: Diagnosis not present

## 2018-01-01 DIAGNOSIS — M79675 Pain in left toe(s): Secondary | ICD-10-CM | POA: Diagnosis not present

## 2018-01-01 DIAGNOSIS — L600 Ingrowing nail: Secondary | ICD-10-CM | POA: Diagnosis not present

## 2018-01-01 DIAGNOSIS — M79674 Pain in right toe(s): Secondary | ICD-10-CM | POA: Diagnosis not present

## 2018-01-01 DIAGNOSIS — B351 Tinea unguium: Secondary | ICD-10-CM | POA: Diagnosis not present

## 2018-01-01 DIAGNOSIS — E1122 Type 2 diabetes mellitus with diabetic chronic kidney disease: Secondary | ICD-10-CM

## 2018-01-10 ENCOUNTER — Ambulatory Visit: Payer: Medicare Other | Admitting: Family Medicine

## 2018-01-11 ENCOUNTER — Encounter: Payer: Self-pay | Admitting: Family Medicine

## 2018-01-11 DIAGNOSIS — Z1231 Encounter for screening mammogram for malignant neoplasm of breast: Secondary | ICD-10-CM | POA: Diagnosis not present

## 2018-01-15 ENCOUNTER — Other Ambulatory Visit: Payer: Self-pay | Admitting: Family Medicine

## 2018-01-15 DIAGNOSIS — M25561 Pain in right knee: Secondary | ICD-10-CM | POA: Diagnosis not present

## 2018-01-15 DIAGNOSIS — M1711 Unilateral primary osteoarthritis, right knee: Secondary | ICD-10-CM | POA: Diagnosis not present

## 2018-01-15 DIAGNOSIS — E1122 Type 2 diabetes mellitus with diabetic chronic kidney disease: Secondary | ICD-10-CM

## 2018-01-15 DIAGNOSIS — N182 Chronic kidney disease, stage 2 (mild): Principal | ICD-10-CM

## 2018-01-15 DIAGNOSIS — S83241D Other tear of medial meniscus, current injury, right knee, subsequent encounter: Secondary | ICD-10-CM | POA: Diagnosis not present

## 2018-01-21 ENCOUNTER — Ambulatory Visit: Payer: Medicare Other | Admitting: Family Medicine

## 2018-01-21 DIAGNOSIS — Z888 Allergy status to other drugs, medicaments and biological substances status: Secondary | ICD-10-CM | POA: Diagnosis not present

## 2018-01-21 DIAGNOSIS — R748 Abnormal levels of other serum enzymes: Secondary | ICD-10-CM | POA: Diagnosis not present

## 2018-01-21 DIAGNOSIS — R079 Chest pain, unspecified: Secondary | ICD-10-CM | POA: Diagnosis not present

## 2018-01-21 DIAGNOSIS — R109 Unspecified abdominal pain: Secondary | ICD-10-CM | POA: Diagnosis not present

## 2018-01-21 DIAGNOSIS — E119 Type 2 diabetes mellitus without complications: Secondary | ICD-10-CM | POA: Diagnosis not present

## 2018-01-21 DIAGNOSIS — Z720 Tobacco use: Secondary | ICD-10-CM | POA: Diagnosis not present

## 2018-01-21 DIAGNOSIS — R112 Nausea with vomiting, unspecified: Secondary | ICD-10-CM | POA: Diagnosis not present

## 2018-01-21 DIAGNOSIS — R1084 Generalized abdominal pain: Secondary | ICD-10-CM | POA: Diagnosis not present

## 2018-01-21 DIAGNOSIS — D696 Thrombocytopenia, unspecified: Secondary | ICD-10-CM | POA: Diagnosis not present

## 2018-01-22 ENCOUNTER — Other Ambulatory Visit: Payer: Self-pay

## 2018-01-29 ENCOUNTER — Telehealth: Payer: Self-pay | Admitting: Family Medicine

## 2018-01-29 NOTE — Telephone Encounter (Signed)
Sent Mammogram info to [facility] - did not populate

## 2018-02-01 DIAGNOSIS — M7989 Other specified soft tissue disorders: Secondary | ICD-10-CM | POA: Diagnosis not present

## 2018-02-01 DIAGNOSIS — M25561 Pain in right knee: Secondary | ICD-10-CM | POA: Diagnosis not present

## 2018-02-01 DIAGNOSIS — M67461 Ganglion, right knee: Secondary | ICD-10-CM | POA: Diagnosis not present

## 2018-02-04 ENCOUNTER — Other Ambulatory Visit: Payer: Self-pay | Admitting: Family Medicine

## 2018-02-06 ENCOUNTER — Telehealth: Payer: Self-pay | Admitting: Family Medicine

## 2018-02-06 ENCOUNTER — Ambulatory Visit: Payer: Medicare Other | Admitting: Family Medicine

## 2018-02-06 NOTE — Telephone Encounter (Signed)
Patients transportation through [facility] called her this morning to cancel transportation because they couldn't find her a ride.  She was coming in because the socks with the grips on the bottom (like hospital socks) are causing soreness on the bottom of her feet. She says physical therapy wants her to wear them. She has already consulted with her podiatrist about this problem. I told her to give them a call to discuss the issue & maybe they can help her without her having to come into the office. She will call back if she needs [place] for anything further.

## 2018-02-13 ENCOUNTER — Telehealth: Payer: Self-pay | Admitting: Gastroenterology

## 2018-02-13 ENCOUNTER — Ambulatory Visit: Payer: Medicare Other | Admitting: Gastroenterology

## 2018-02-13 ENCOUNTER — Encounter: Payer: Self-pay | Admitting: Gastroenterology

## 2018-02-13 NOTE — Telephone Encounter (Signed)
PATIENT WAS A NO SHOW AND LETTER SENT  °

## 2018-02-14 NOTE — Telephone Encounter (Signed)
NOTIFY PCP.

## 2018-02-20 ENCOUNTER — Other Ambulatory Visit: Payer: Self-pay | Admitting: Family Medicine

## 2018-02-20 DIAGNOSIS — R1013 Epigastric pain: Secondary | ICD-10-CM

## 2018-02-26 ENCOUNTER — Telehealth: Payer: Self-pay | Admitting: Family Medicine

## 2018-02-26 ENCOUNTER — Ambulatory Visit: Payer: Medicare Other | Admitting: Family Medicine

## 2018-02-26 NOTE — Telephone Encounter (Signed)
Spoke with patient and advised of message below. Advised patient if her symptoms got worse before her appt to go to the ED. Told patient to bring all of her medication bottles with her with verbal understanding of everything we discussed.

## 2018-02-26 NOTE — Telephone Encounter (Signed)
Patient called in requesting an appt for her feet (pain/swelling) She denies shortness of breathe or chest pain.  States she has some sinus problems though

## 2018-02-26 NOTE — Telephone Encounter (Signed)
Please advise patient that we can work her in for tomorrow at 1140.  Please advise if she develops any worsening swelling, shortness of breath chest pain or any worrisome symptoms to please follow-up.  In addition please advised her that she can elevate her feet, make sure she is taking all of her medications, and try to make sure she is not consuming too much salt.  Asked her to please bring all her medications into the office visit.

## 2018-02-26 NOTE — Telephone Encounter (Signed)
Patient was scheduled for today however someone at [facility] canceled her appt, looks like it was by mistake. She was calling to let us know [employer] could not find her a ride this morning and was asking for a different appt because of swelling in her right foot and pain to stand and walk.  [phone]

## 2018-02-27 ENCOUNTER — Ambulatory Visit: Payer: Medicare Other | Admitting: Family Medicine

## 2018-03-01 ENCOUNTER — Ambulatory Visit: Payer: Medicare Other | Admitting: Family Medicine

## 2018-03-07 DIAGNOSIS — M19071 Primary osteoarthritis, right ankle and foot: Secondary | ICD-10-CM | POA: Diagnosis not present

## 2018-03-07 DIAGNOSIS — M79671 Pain in right foot: Secondary | ICD-10-CM | POA: Diagnosis not present

## 2018-03-08 ENCOUNTER — Ambulatory Visit (INDEPENDENT_AMBULATORY_CARE_PROVIDER_SITE_OTHER): Payer: Medicare Other | Admitting: Family Medicine

## 2018-03-08 ENCOUNTER — Encounter: Payer: Self-pay | Admitting: Family Medicine

## 2018-03-08 VITALS — BP 140/80 | HR 88 | Resp 16 | Ht 63.0 in | Wt 191.0 lb

## 2018-03-08 DIAGNOSIS — N182 Chronic kidney disease, stage 2 (mild): Secondary | ICD-10-CM | POA: Diagnosis not present

## 2018-03-08 DIAGNOSIS — I10 Essential (primary) hypertension: Secondary | ICD-10-CM | POA: Diagnosis not present

## 2018-03-08 DIAGNOSIS — Z1211 Encounter for screening for malignant neoplasm of colon: Secondary | ICD-10-CM | POA: Diagnosis not present

## 2018-03-08 DIAGNOSIS — K219 Gastro-esophageal reflux disease without esophagitis: Secondary | ICD-10-CM

## 2018-03-08 DIAGNOSIS — L309 Dermatitis, unspecified: Secondary | ICD-10-CM

## 2018-03-08 DIAGNOSIS — E1142 Type 2 diabetes mellitus with diabetic polyneuropathy: Secondary | ICD-10-CM

## 2018-03-08 DIAGNOSIS — E1122 Type 2 diabetes mellitus with diabetic chronic kidney disease: Secondary | ICD-10-CM | POA: Diagnosis not present

## 2018-03-08 DIAGNOSIS — F419 Anxiety disorder, unspecified: Secondary | ICD-10-CM

## 2018-03-08 MED ORDER — TRIAMTERENE-HCTZ 37.5-25 MG PO TABS: 1.0000 | ORAL_TABLET | Freq: Every day | ORAL | 3 refills | Status: DC

## 2018-03-08 MED ORDER — GABAPENTIN 100 MG PO CAPS: 100.0000 mg | ORAL_CAPSULE | Freq: Two times a day (BID) | ORAL | 1 refills | Status: DC

## 2018-03-08 MED ORDER — HYDROCORTISONE 2.5 % EX OINT: TOPICAL_OINTMENT | Freq: Two times a day (BID) | CUTANEOUS | 0 refills | Status: DC

## 2018-03-08 NOTE — Progress Notes (Signed)
Patient ID: Barbara House, female    DOB: 01/18/1956, 62 y.o.   MRN: [id number]  Chief Complaint  Patient presents with  . Foot Pain    bilatal foot tenderness and sharp pains shooting through both under the bottom when she is walking. Right aches more than left. Hurting for over a week     Allergies Prednisone  Subjective:   Barbara House is a 62 y.o. female who presents to [facility] today.  HPI [name] presents for an office visit today to discuss her foot pain.  She reports that for several months she has had pain in the bottoms of her feet.  She reports that her feet hurt all night.  A bother her during the day also.  She reports that yesterday she went to see an orthopedic about her foot pain and he gave her prescription.  She is not sure of which prescription she was given.  We did call the [facility] in [place] and it appears that she was given diclofenac 75 mg, 1 p.o. twice daily as needed foot pain.  She reports that her feet hurt and burn.  She does not have any sores or lesions on her feet but reports that it feels like she is walking on sharp surfaces or glass.  She reports that her sugars have been running pretty well.  She does not always check them at home.  She has never been on any medication for diabetic peripheral neuropathy.  She is due to get her hemoglobin A1c checked today.  She does request a refill on some hydrocortisone cream.  She reports it was given to her by dermatologist to use for dermatitis which she gets on her skin.  She reports that she gets some itching and scaliness of her skin and it bothers her.  She is used this for a long time and would like a refill.  She reports she has been taking her blood pressure medication each day but feels like she is occasionally getting some swelling in her feet.  She reports that she takes her medications at night.  She denies any chest pain, shortness of breath, or palpitations.  She  reports that she has had a colonoscopy in the past but believes she is due now.  Review of the records reveal that she has had colonoscopy in September 2009 by Dr. Wonda House in [facility].  She denies any problems with her bowels.  She is due for screening.  She does report she struck first from reflux.  She has not had an endoscopy per her report.  She has been on Zantac or an acid medicine for years.  She reports she does still get a bitter taste in her mouth that bothers her quite frequently.  She denies any epigastric pain.  She is taking her allergy medication.  She denies any shortness of breath.  Denies any cough.   Past Medical History:  Diagnosis Date  . Allergic rhinitis   . Anxiety disorder   . GERD (gastroesophageal reflux disease)   . History of migraine   . Hypertension   . Type 2 diabetes mellitus ([place])    Not sure when she was diagnosed, greater than five years.     Past Surgical History:  Procedure Laterality Date  . ABDOMINAL HYSTERECTOMY     has ovaries.   [name] CARDIAC CATHETERIZATION N/A 08/03/2016   Procedure: Left Heart Cath and Coronary Angiography;  Surgeon: Peter M Martinique, MD;  Location: [place] [facility];  Service: Cardiovascular;  Laterality: N/A;  . Miinor skin surgery      Family History  Problem Relation Age of Onset  . Hypertension Mother   . Diabetes Mother   . Hypertension Son   . Diabetes Son   . Cancer Father   . Thyroid cancer Sister   . Cirrhosis Brother   . Alcohol abuse Brother      Social History   Socioeconomic History  . Marital status: Single    Spouse name: Not on file  . Number of children: Not on file  . Years of education: Not on file  . Highest education level: Not on file  Occupational History  . Not on file  Social Needs  . Financial resource strain: Not on file  . Food insecurity:    Worry: Not on file    Inability: Not on file  . Transportation needs:    Medical: Not on file    Non-medical: Not on file  Tobacco Use    . Smoking status: Never Smoker  . Smokeless tobacco: Never Used  Substance and Sexual Activity  . Alcohol use: No    Alcohol/week: 0.0 oz  . Drug use: No  . Sexual activity: Not on file  Lifestyle  . Physical activity:    Days per week: 7 days    Minutes per session: 20 min  . Stress: Only a little  Relationships  . Social connections:    Talks on phone: Not on file    Gets together: Not on file    Attends religious service: Not on file    Active member of club or organization: Not on file    Attends meetings of clubs or organizations: Not on file    Relationship status: Not on file  Other Topics Concern  . Not on file  Social History Narrative   Reports is on disability. Has one son. Lives in [place]. Enjoys time with family. Enjoys Firefighter. Attends church.    Current Outpatient Medications on File Prior to Visit  Medication Sig Dispense Refill  . aspirin EC 81 MG tablet Take 81 mg by mouth daily.    [name] atorvastatin (LIPITOR) 40 MG tablet Take 1 tablet (40 mg total) by mouth daily. 90 tablet 3  . augmented betamethasone dipropionate (DIPROLENE-AF) 0.05 % cream Apply 1 application topically 2 (two) times daily as needed (for itching).    . Azelastine HCl 0.15 % SOLN Place 1 spray into the nose at bedtime as needed. 30 mL 5  . cholecalciferol (VITAMIN D) 1000 units tablet Take 1,000 Units by mouth daily.    . cycloSPORINE (RESTASIS) 0.05 % ophthalmic emulsion Place 1 drop into both eyes 2 (two) times daily as needed (for eye dryness.).    [name] DEXILANT 30 MG capsule TAKE ONE CAPSULE BY MOUTH DAILY. 90 capsule 1  . fluticasone (FLONASE) 50 MCG/ACT nasal spray Place 1-2 sprays into both nostrils daily as needed (for sinus issues.). 16 g 5  . glipiZIDE (GLUCOTROL XL) 10 MG 24 hr tablet TAKE ONE TABLET BY MOUTH DAILY. TAKE WITH BREAKFAST. 30 tablet 1  . levocetirizine (XYZAL) 5 MG tablet Take 1 tablet (5 mg total) by mouth every evening. 30 tablet 3  . lisinopril (PRINIVIL,ZESTRIL) 40  MG tablet Take 1 tablet (40 mg total) by mouth daily. 90 tablet 3  . metFORMIN (GLUCOPHAGE) 1000 MG tablet TAKE ONE TABLET BY MOUTH TWICE DAILY 60 tablet 0  . montelukast (SINGULAIR) 10 MG  tablet TAKE 1 TABLET (10 MG TOTAL) BY MOUTH AT BEDTIME AS NEEDED (FOR ALLERGIES.). 30 tablet 3  . nabumetone (RELAFEN) 750 MG tablet TAKE ONE TABLET BY MOUTH TWICE DAILY AS DIRECTED.  5  . Olopatadine HCl (PATANASE) 0.6 % SOLN Use 1-2 sprays each nostril daily 30.5 g 3  . PARoxetine (PAXIL) 40 MG tablet TAKE ONE TABLET BY MOUTH EVERY MORNING. 90 tablet 0  . ranitidine (ZANTAC) 150 MG tablet Take 150 mg by mouth 2 (two) times daily.  0  . TRADJENTA 5 MG TABS tablet TAKE ONE TABLET BY MOUTH DAILY. 30 tablet 2  . traZODone (DESYREL) 50 MG tablet Take 50-100 mg by mouth at bedtime as needed for sleep.    [name] UNABLE TO FIND Diabetic shoe 1 each 0  . verapamil (CALAN-SR) 240 MG CR tablet TAKE ONE TABLET BY MOUTH IN THE EVENING 30 tablet 0   No current facility-administered medications on file prior to visit.     Review of Systems   Objective:   BP 140/80   Pulse 88   Resp 16   Ht 5\' 3"  (1.6 m)   Wt 191 lb (86.6 kg)   SpO2 97%   BMI 33.83 kg/m   Physical Exam  Constitutional: She appears well-developed and well-nourished. No distress.  HENT:  Head: Normocephalic and atraumatic.  Eyes: Pupils are equal, round, and reactive to light. EOM are normal. No scleral icterus.  Neck: Normal range of motion. Neck supple.  Cardiovascular: Normal rate, regular rhythm, normal heart sounds and intact distal pulses.  Pulses:      Dorsalis pedis pulses are 1+ on the right side, and 1+ on the left side.       Posterior tibial pulses are 1+ on the right side, and 1+ on the left side.  Pulmonary/Chest: Effort normal and breath sounds normal.  Abdominal: Soft. Bowel sounds are normal.  Musculoskeletal: Normal range of motion.  Feet:  Right Foot:  Protective Sensation: 0 sites tested. 0 sites sensed.  Skin Integrity:  Positive for callus and dry skin. Negative for ulcer, blister, skin breakdown, erythema or warmth.  Left Foot:  Protective Sensation: 0 sites tested. 0 sites sensed.  Skin Integrity: Positive for callus and dry skin. Negative for ulcer, blister, skin breakdown, erythema or warmth.  Neurological: She is alert.  Skin: Skin is warm and dry. Capillary refill takes less than 2 seconds. She is not diaphoretic.  Psychiatric: She has a normal mood and affect. Her behavior is normal.     Assessment and Plan  1. Type 2 diabetes mellitus with stage 2 chronic kidney disease, without long-term current use of insulin ([facility]) Check hemoglobin A1c at this time.  Diet, exercise, and weight loss reduction was recommended.  We did discuss dietary modifications today.  Eye exam recommended. - Hemoglobin A1c  2. Essential hypertension Pressure not at goal.  Will DC HCTZ and change to triamterene/HCTZ p.o. daily.  We will follow-up in 1 month and check BMP at that time. - COMPLETE METABOLIC PANEL WITH GFR - triamterene-hydrochlorothiazide (MAXZIDE-25) 37.5-25 MG tablet; Take 1 tablet by mouth daily.  Dispense: 90 tablet; Refill: 3  3. Anxiety Improved control.  Continue Paxil daily. Was told if she develops any mood problems to please call our office or seek medical help.  She voiced understanding.  4. Dermatitis Patient reports that she has been given this hydrocortisone cream for years by dermatology.  She reports that she uses it sparingly all over her body.  I  did discuss with patient that it is not recommended to use steroid creams on face due to risk of skin thinning, pigmentation changes, glaucoma, and steroid dermatitis.  She voiced understanding.  She still requests refill.  She would like to follow back up with her dermatologist.  I am definitely agreeable with this. - hydrocortisone 2.5 % ointment; Apply topically 2 (two) times daily.  Dispense: 30 g; Refill: 0 - Ambulatory referral to  Dermatology  5. Diabetic peripheral neuropathy [facility]) Patient counseled in detail regarding the risks of medication. Told to call or return to clinic if develop any worrisome signs or symptoms. Patient voiced understanding.  Trial of Neurontin.  1 p.o. twice daily.  Will increase as needed.  She is to call the office in 2 weeks.  Diabetic foot exam not performed today due to patient's increased sensitivity and request. - gabapentin (NEURONTIN) 100 MG capsule; Take 1 capsule (100 mg total) by mouth 2 (two) times daily.  Dispense: 60 capsule; Refill: 1 Podiatry referral placed.  Diabetic foot care discussed with patient.  Patient has prescription for diabetic shoes. Discussed with patient that I would refer to podiatry for evaluation of her feet secondary to her calluses and dry skin. 6.  Screen for colon cancer  - Ambulatory referral to Gastroenterology  8. Gastroesophageal reflux disease without esophagitis I did discuss with patient that it sounds like she is having some symptoms with her reflux.  She does not wish to change from the Zantac at this time.  Referral is placed to gastroenterology.  I do recommend patient consult for reflux symptoms and possible EGD at that time.  Reflux precautions discussed. - Ambulatory referral to Gastroenterology  Return in about 1 month (around 04/05/2018) for Blood pressure check. Caren Macadam, MD 03/08/2018

## 2018-03-08 NOTE — Patient Instructions (Addendum)
You can try to the neurontin/gabapentin for pain in feet Take one pill twice a day every day Call me a week and if not better, then I will tell you how to increase the medication.   Start the triamterene/HCTZ each day as directed for Blood pressure Stop the HCTZ.   Get labs today

## 2018-03-09 LAB — COMPLETE METABOLIC PANEL WITH GFR
AG Ratio: 1.5 (calc) (ref 1.0–2.5)
ALBUMIN MSPROF: 4.3 g/dL (ref 3.6–5.1)
ALT: 17 U/L (ref 6–29)
AST: 19 U/L (ref 10–35)
Alkaline phosphatase (APISO): 79 U/L (ref 33–130)
BILIRUBIN TOTAL: 0.4 mg/dL (ref 0.2–1.2)
BUN: 19 mg/dL (ref 7–25)
CALCIUM: 10.3 mg/dL (ref 8.6–10.4)
CHLORIDE: 103 mmol/L (ref 98–110)
CO2: 31 mmol/L (ref 20–32)
Creat: 0.87 mg/dL (ref 0.50–1.05)
GFR, EST AFRICAN AMERICAN: 85 mL/min/{1.73_m2} (ref >=60)
GFR, EST NON AFRICAN AMERICAN: 73 mL/min/{1.73_m2} (ref >=60)
Globulin: 2.8 g/dL (calc) (ref 1.9–3.7)
Glucose, Bld: 79 mg/dL (ref 65–139)
Potassium: 3.9 mmol/L (ref 3.5–5.3)
Sodium: 141 mmol/L (ref 135–146)
TOTAL PROTEIN: 7.1 g/dL (ref 6.1–8.1)

## 2018-03-09 LAB — HEMOGLOBIN A1C
Hgb A1c MFr Bld: 6.7 % of total Hgb — ABNORMAL HIGH (ref <5.7)
Mean Plasma Glucose: 146 (calc)
eAG (mmol/L): 8.1 (calc)

## 2018-03-12 ENCOUNTER — Encounter: Payer: Self-pay | Admitting: Family Medicine

## 2018-03-14 ENCOUNTER — Other Ambulatory Visit: Payer: Self-pay

## 2018-03-14 DIAGNOSIS — E1122 Type 2 diabetes mellitus with diabetic chronic kidney disease: Secondary | ICD-10-CM

## 2018-03-14 DIAGNOSIS — N182 Chronic kidney disease, stage 2 (mild): Principal | ICD-10-CM

## 2018-03-14 MED ORDER — LINAGLIPTIN 5 MG PO TABS: 5.0000 mg | ORAL_TABLET | Freq: Every day | ORAL | 0 refills | Status: DC

## 2018-03-16 ENCOUNTER — Other Ambulatory Visit: Payer: Self-pay | Admitting: Family Medicine

## 2018-03-16 DIAGNOSIS — E1122 Type 2 diabetes mellitus with diabetic chronic kidney disease: Secondary | ICD-10-CM

## 2018-03-16 DIAGNOSIS — N182 Chronic kidney disease, stage 2 (mild): Principal | ICD-10-CM

## 2018-03-20 ENCOUNTER — Other Ambulatory Visit: Payer: Self-pay | Admitting: Family Medicine

## 2018-03-21 ENCOUNTER — Ambulatory Visit: Payer: Medicare Other | Admitting: Family Medicine

## 2018-03-21 ENCOUNTER — Encounter: Payer: Self-pay | Admitting: Family Medicine

## 2018-03-21 ENCOUNTER — Ambulatory Visit (INDEPENDENT_AMBULATORY_CARE_PROVIDER_SITE_OTHER): Payer: Medicare Other | Admitting: Family Medicine

## 2018-03-21 ENCOUNTER — Other Ambulatory Visit: Payer: Self-pay

## 2018-03-21 VITALS — BP 114/70 | HR 74 | Temp 98.4°F | Resp 12 | Ht 61.0 in | Wt 192.1 lb

## 2018-03-21 DIAGNOSIS — M791 Myalgia, unspecified site: Secondary | ICD-10-CM | POA: Diagnosis not present

## 2018-03-21 DIAGNOSIS — N182 Chronic kidney disease, stage 2 (mild): Secondary | ICD-10-CM

## 2018-03-21 DIAGNOSIS — E1140 Type 2 diabetes mellitus with diabetic neuropathy, unspecified: Secondary | ICD-10-CM | POA: Diagnosis not present

## 2018-03-21 DIAGNOSIS — E1122 Type 2 diabetes mellitus with diabetic chronic kidney disease: Secondary | ICD-10-CM | POA: Diagnosis not present

## 2018-03-21 LAB — BASIC METABOLIC PANEL
BUN: 18 mg/dL (ref 7–25)
CALCIUM: 10.2 mg/dL (ref 8.6–10.4)
CO2: 29 mmol/L (ref 20–32)
CREATININE: 0.89 mg/dL (ref 0.50–1.05)
Chloride: 105 mmol/L (ref 98–110)
GLUCOSE: 151 mg/dL — AB (ref 65–139)
POTASSIUM: 4.7 mmol/L (ref 3.5–5.3)
SODIUM: 140 mmol/L (ref 135–146)

## 2018-03-21 LAB — CK: Total CK: 188 U/L — ABNORMAL HIGH (ref 29–143)

## 2018-03-21 LAB — MAGNESIUM: MAGNESIUM: 1.5 mg/dL (ref 1.5–2.5)

## 2018-03-21 NOTE — Progress Notes (Signed)
Patient ID: Barbara House, female    DOB: 1956-06-02, 62 y.o.   MRN: [id number]  Chief Complaint  Patient presents with  . Foot Pain    both feet    Allergies Prednisone  Subjective:   Barbara House is a 62 y.o. female who presents to [facility] today.  HPI  Barbara House presents today to discuss her diabetic foot pain and muscle pains.  She reports that she has had a diagnosis of diabetic peripheral neuropathy for many years.  She reports that her previous primary care physician gave her prescription for diabetic shoes.  She reports that she was also placed on Neurontin for the pain.  She does not like to take Neurontin on a daily basis because she does not like to take pain medicines.  She reports the medication makes her feel little bit sleepy.  She reports that her feet bother her on a daily basis during the day and the night.  She reports that she cannot walk on gravel because her feet are too tender.  She reports that her feet burn and feel puffy.  She reports that she occasionally does use the Neurontin.  She reports that when she had diabetic shoes that it greatly helped her feet.  She reports that the cushioning in the shoes made it much more comfortable for her to walk.  She [facility].  She reports that her muscles have been sore for many years.  She reports that they might get more sore depending on what activity she does.  She reports that she does not believe it is related to her cholesterol medication because she has had this even before she was put on cholesterol medication.   She reports that since her fall in the winter that her knee and legs have been sore to touch.  She reports she is mainly here today to get a diabetic shoe prescription.   Past Medical History:  Diagnosis Date  . Allergic rhinitis   . Anxiety disorder   . GERD (gastroesophageal reflux disease)   . History of  migraine   . Hypertension   . Type 2 diabetes mellitus ([place])    Not sure when she was diagnosed, greater than five years.     Past Surgical History:  Procedure Laterality Date  . ABDOMINAL HYSTERECTOMY     has ovaries.   [name] CARDIAC CATHETERIZATION N/A 08/03/2016   Procedure: Left Heart Cath and Coronary Angiography;  Surgeon: Peter M Martinique, MD;  Location: [place] [facility];  Service: Cardiovascular;  Laterality: N/A;  . Miinor skin surgery      Family History  Problem Relation Age of Onset  . Hypertension Mother   . Diabetes Mother   . Hypertension Son   . Diabetes Son   . Cancer Father   . Thyroid cancer Sister   . Cirrhosis Brother   . Alcohol abuse Brother      Social History   Socioeconomic History  . Marital status: Single    Spouse name: Not on file  . Number of children: Not on file  . Years of education: Not on file  . Highest education level: Not on file  Occupational History  . Not on file  Social Needs  . Financial resource strain: Not on file  . Food insecurity:    Worry: Not on file    Inability: Not on file  .  Transportation needs:    Medical: Not on file    Non-medical: Not on file  Tobacco Use  . Smoking status: Never Smoker  . Smokeless tobacco: Never Used  Substance and Sexual Activity  . Alcohol use: No    Alcohol/week: 0.0 oz  . Drug use: No  . Sexual activity: Not on file  Lifestyle  . Physical activity:    Days per week: 7 days    Minutes per session: 20 min  . Stress: Only a little  Relationships  . Social connections:    Talks on phone: Not on file    Gets together: Not on file    Attends religious service: Not on file    Active member of club or organization: Not on file    Attends meetings of clubs or organizations: Not on file    Relationship status: Not on file  Other Topics Concern  . Not on file  Social History Narrative   Reports is on disability. Has one son. Lives in [place]. Enjoys time with family. Enjoys  Firefighter. Attends church.    Current Outpatient Medications on File Prior to Visit  Medication Sig Dispense Refill  . aspirin EC 81 MG tablet Take 81 mg by mouth daily.    [name] atorvastatin (LIPITOR) 40 MG tablet Take 1 tablet (40 mg total) by mouth daily. 90 tablet 3  . augmented betamethasone dipropionate (DIPROLENE-AF) 0.05 % cream Apply 1 application topically 2 (two) times daily as needed (for itching).    . Azelastine HCl 0.15 % SOLN Place 1 spray into the nose at bedtime as needed. 30 mL 5  . cholecalciferol (VITAMIN D) 1000 units tablet Take 1,000 Units by mouth daily.    . cycloSPORINE (RESTASIS) 0.05 % ophthalmic emulsion Place 1 drop into both eyes 2 (two) times daily as needed (for eye dryness.).    [name] DEXILANT 30 MG capsule TAKE ONE CAPSULE BY MOUTH DAILY. 90 capsule 1  . fluticasone (FLONASE) 50 MCG/ACT nasal spray Place 1-2 sprays into both nostrils daily as needed (for sinus issues.). 16 g 5  . gabapentin (NEURONTIN) 100 MG capsule Take 1 capsule (100 mg total) by mouth 2 (two) times daily. 60 capsule 1  . glipiZIDE (GLUCOTROL XL) 10 MG 24 hr tablet TAKE ONE TABLET BY MOUTH DAILY. TAKE WITH BREAKFAST. 90 tablet 0  . hydrocortisone 2.5 % ointment Apply topically 2 (two) times daily. 30 g 0  . levocetirizine (XYZAL) 5 MG tablet Take 1 tablet (5 mg total) by mouth every evening. 30 tablet 3  . linagliptin (TRADJENTA) 5 MG TABS tablet Take 1 tablet (5 mg total) by mouth daily. 90 tablet 0  . lisinopril (PRINIVIL,ZESTRIL) 40 MG tablet Take 1 tablet (40 mg total) by mouth daily. 90 tablet 3  . metFORMIN (GLUCOPHAGE) 1000 MG tablet TAKE ONE TABLET BY MOUTH TWICE DAILY 60 tablet 0  . montelukast (SINGULAIR) 10 MG tablet TAKE 1 TABLET (10 MG TOTAL) BY MOUTH AT BEDTIME AS NEEDED (FOR ALLERGIES.). 30 tablet 3  . nabumetone (RELAFEN) 750 MG tablet TAKE ONE TABLET BY MOUTH TWICE DAILY AS DIRECTED.  5  . Olopatadine HCl (PATANASE) 0.6 % SOLN Use 1-2 sprays each nostril daily 30.5 g 3  .  PARoxetine (PAXIL) 40 MG tablet TAKE ONE TABLET BY MOUTH EVERY MORNING. 90 tablet 0  . PARoxetine (PAXIL) 40 MG tablet TAKE ONE TABLET BY MOUTH EVERY MORNING. 90 tablet 0  . ranitidine (ZANTAC) 150 MG tablet Take 150 mg by mouth 2 (two)  times daily.  0  . traZODone (DESYREL) 50 MG tablet Take 50-100 mg by mouth at bedtime as needed for sleep.    [name] triamterene-hydrochlorothiazide (MAXZIDE-25) 37.5-25 MG tablet Take 1 tablet by mouth daily. 90 tablet 3  . UNABLE TO FIND Diabetic shoe 1 each 0  . verapamil (CALAN-SR) 240 MG CR tablet TAKE ONE TABLET BY MOUTH IN THE EVENING 90 tablet 0   No current facility-administered medications on file prior to visit.     Review of Systems  Constitutional: Negative for activity change, appetite change and fever.  Eyes: Negative for visual disturbance.  Respiratory: Negative for cough, chest tightness and shortness of breath.   Cardiovascular: Negative for chest pain, palpitations and leg swelling.  Gastrointestinal: Negative for abdominal pain, nausea and vomiting.  Genitourinary: Negative for dysuria, frequency and urgency.  Musculoskeletal: Positive for myalgias.  Neurological: Negative for dizziness, syncope and light-headedness.       Burning pain in feet.  Hematological: Negative for adenopathy.  Psychiatric/Behavioral: Negative for behavioral problems and dysphoric mood. The patient is not nervous/anxious.      Objective:   BP 114/70 (BP Location: Left Arm, Patient Position: Sitting, Cuff Size: Large)   Pulse 74   Temp 98.4 F (36.9 C) (Temporal)   Resp 12   Ht 5\' 1"  (1.549 m)   Wt 192 lb 1.9 oz (87.1 kg)   SpO2 94%   BMI 36.30 kg/m   Physical Exam  Constitutional: She is oriented to person, place, and time. She appears well-developed and well-nourished. No distress.  HENT:  Head: Normocephalic and atraumatic.  Eyes: Pupils are equal, round, and reactive to light.  Neck: Normal range of motion. Neck supple. No thyromegaly present.    Cardiovascular: Normal rate, regular rhythm and normal heart sounds.  Pulses:      Dorsalis pedis pulses are 1+ on the right side, and 2+ on the left side.  Pulmonary/Chest: Effort normal and breath sounds normal. No respiratory distress.  Musculoskeletal: Normal range of motion. She exhibits no edema.  Patient does have some mild tenderness to palpation with any and all muscle groups.  Feet:  Right Foot:  Protective Sensation: 8 sites tested. 6 sites sensed.  Skin Integrity: Positive for callus and dry skin.  Left Foot:  Protective Sensation: 8 sites tested. 6 sites sensed.  Skin Integrity: Positive for callus and dry skin.  Neurological: She is alert and oriented to person, place, and time. No cranial nerve deficit.  Skin: Skin is warm and dry.  Nursing note and vitals reviewed.    Assessment and Plan   1. Type 2 diabetes mellitus with diabetic neuropathy, without long-term current use of insulin ([place]) Patient was given a prescription for diabetic shoes.  She should also [facility] as [facility].  We discussed diabetic foot care today.  We discussed that she does have a toenail fungus.  She does have some calluses on the bottom of her feet.  She does not wish to increase the dose of her Neurontin nor does she wish to take it on a daily basis.  She will use the medication as needed. - DME Other see comment  3. Myalgia She reports chronic myalgias which have been going on for long-term.  She does not believe they are associated with statin use.  We will check labs to rule out any muscle inflammation or muscle pain secondary to electrolyte abnormality. - CK (Creatine Kinase) - Basic metabolic panel - Magnesium  Return  in about 3 months (around 06/21/2018). Caren Macadam, MD 03/21/2018

## 2018-03-22 ENCOUNTER — Encounter: Payer: Self-pay | Admitting: Family Medicine

## 2018-03-22 ENCOUNTER — Other Ambulatory Visit: Payer: Self-pay | Admitting: Family Medicine

## 2018-03-22 DIAGNOSIS — E1142 Type 2 diabetes mellitus with diabetic polyneuropathy: Secondary | ICD-10-CM

## 2018-03-25 ENCOUNTER — Other Ambulatory Visit: Payer: Self-pay

## 2018-03-25 DIAGNOSIS — E1142 Type 2 diabetes mellitus with diabetic polyneuropathy: Secondary | ICD-10-CM

## 2018-03-25 MED ORDER — GABAPENTIN 100 MG PO CAPS: 100.0000 mg | ORAL_CAPSULE | Freq: Two times a day (BID) | ORAL | 0 refills | Status: DC

## 2018-03-27 ENCOUNTER — Ambulatory Visit: Payer: Medicare Other | Admitting: Podiatry

## 2018-03-27 ENCOUNTER — Encounter: Payer: Self-pay | Admitting: Family Medicine

## 2018-03-27 ENCOUNTER — Telehealth: Payer: Self-pay | Admitting: Family Medicine

## 2018-03-27 DIAGNOSIS — E1122 Type 2 diabetes mellitus with diabetic chronic kidney disease: Secondary | ICD-10-CM

## 2018-03-27 DIAGNOSIS — N182 Chronic kidney disease, stage 2 (mild): Principal | ICD-10-CM

## 2018-03-27 MED ORDER — ATORVASTATIN CALCIUM 40 MG PO TABS: 20.0000 mg | ORAL_TABLET | Freq: Every day | ORAL | Status: DC

## 2018-03-27 NOTE — Telephone Encounter (Signed)
In addition to the below message, please advise patient that her muscle enyzmes were a little bit elevated. Ask her to decrease her lipitor/atorvastatin to 20 mg at night. This would mean she would take 1/2 tablet at night. Advise her to follow up. Please ask her if she is having any muscle pain or cramps at this time.   [name]. Mannie Stabile, MD

## 2018-03-27 NOTE — Telephone Encounter (Signed)
PT is calling she has a Yeast infection --and needs something

## 2018-03-27 NOTE — Telephone Encounter (Signed)
Please call patient and find out what is going on with her and where she believe she has a yeast infection. Barbara Her. Mannie Stabile, MD

## 2018-03-28 NOTE — Telephone Encounter (Signed)
Called patient. No answer. No vm. Will try again later.

## 2018-04-01 ENCOUNTER — Telehealth: Payer: Self-pay | Admitting: Family Medicine

## 2018-04-01 NOTE — Telephone Encounter (Signed)
[facility] called and wanted to change the Metformin to 90 days since the PT uses transportation from [place] to get Medicine, No nurses available, and pt was waiting, I advised ok

## 2018-04-02 DIAGNOSIS — L84 Corns and callosities: Secondary | ICD-10-CM | POA: Diagnosis not present

## 2018-04-02 DIAGNOSIS — B351 Tinea unguium: Secondary | ICD-10-CM | POA: Diagnosis not present

## 2018-04-02 DIAGNOSIS — E1149 Type 2 diabetes mellitus with other diabetic neurological complication: Secondary | ICD-10-CM | POA: Diagnosis not present

## 2018-04-03 NOTE — Telephone Encounter (Signed)
Called patient. No answer, no vm. Will try again later.

## 2018-04-04 ENCOUNTER — Telehealth: Payer: Self-pay | Admitting: Family Medicine

## 2018-04-04 NOTE — Telephone Encounter (Signed)
Called patient to discuss labs, no answer, unable to leave vm.

## 2018-04-04 NOTE — Telephone Encounter (Signed)
Patient left voicemail requesting a call back to discuss lab results she received in the mail because she doesn't understand it. Cb# [id number]

## 2018-04-04 NOTE — Telephone Encounter (Signed)
Patient called requesting a call back to discuss labs she received letter about and she doesn't understand them. Have tried 3 times to contact patient regarding abnormal lab result without success. I'm unable to leave a vm. Will try again later.

## 2018-04-05 NOTE — Telephone Encounter (Signed)
Spoke with patient and advised of lab results

## 2018-04-05 NOTE — Telephone Encounter (Signed)
Spoke with patient and advised her of Dr.Hagler's recommendation to decrease Lipitor to 20 mg by mouth at night or 1/2 tablet. Told patient several times to cut her tablet in half and reminded her to keep her appointment w/ Dr.Hagler on July 5.

## 2018-04-08 ENCOUNTER — Encounter: Payer: Self-pay | Admitting: Podiatry

## 2018-04-08 ENCOUNTER — Ambulatory Visit (INDEPENDENT_AMBULATORY_CARE_PROVIDER_SITE_OTHER): Payer: Medicare Other | Admitting: Podiatry

## 2018-04-08 DIAGNOSIS — M2041 Other hammer toe(s) (acquired), right foot: Secondary | ICD-10-CM

## 2018-04-08 DIAGNOSIS — L84 Corns and callosities: Secondary | ICD-10-CM

## 2018-04-08 DIAGNOSIS — E0843 Diabetes mellitus due to underlying condition with diabetic autonomic (poly)neuropathy: Secondary | ICD-10-CM

## 2018-04-08 DIAGNOSIS — M2042 Other hammer toe(s) (acquired), left foot: Secondary | ICD-10-CM | POA: Diagnosis not present

## 2018-04-09 ENCOUNTER — Encounter: Payer: Self-pay | Admitting: Family Medicine

## 2018-04-12 NOTE — Progress Notes (Signed)
   HPI: 62 year old female with PMHx of T2DM presenting today as a new patient with a chief complaint of burning and tingling of bilateral feet that has been ongoing for the past several years. She states she used to wear diabetic shoes 3-4 years ago but no longer has them. There are no modifying factors noted. Patient is here for further evaluation and treatment.   Past Medical History:  Diagnosis Date  . Allergic rhinitis   . Anxiety disorder   . GERD (gastroesophageal reflux disease)   . History of migraine   . Hypertension   . Type 2 diabetes mellitus ([place])    Not sure when she was diagnosed, greater than five years.       Objective: Physical Exam General: The patient is alert and oriented x3 in no acute distress.  Dermatology: Hyperkeratotic lesion present on the bilateral sub metatarsals. Pain on palpation with a central nucleated core noted. Skin is cool, dry and supple bilateral lower extremities. Negative for open lesions or macerations.  Vascular: Palpable pedal pulses bilaterally. No edema or erythema noted. Capillary refill within normal limits.  Neurological: Epicritic and protective threshold diminished bilaterally.   Musculoskeletal Exam: All pedal and ankle joints range of motion within normal limits bilateral. Muscle strength 5/5 in all groups bilateral. Hammertoe contracture deformity noted to digits 2-5 of the bilateral feet.  Assessment: 1. DM with neuropathy  2. 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 Pre-ulcerative callus lesions noted to bilateral sub metatarsals    Plan of Care:  1. Patient evaluated.  2. Appointment with Liliane Channel for DM shoes 3. Return to clinic as needed.     Edrick Kins, DPM [facility]  Dr. Edrick Kins, DPM    [address], [place] [address]                Office [phone]  Fax [phone]

## 2018-04-16 ENCOUNTER — Telehealth: Payer: Self-pay | Admitting: Podiatry

## 2018-04-16 ENCOUNTER — Ambulatory Visit: Payer: Medicare Other | Admitting: Orthotics

## 2018-04-16 DIAGNOSIS — M2042 Other hammer toe(s) (acquired), left foot: Secondary | ICD-10-CM

## 2018-04-16 DIAGNOSIS — E0843 Diabetes mellitus due to underlying condition with diabetic autonomic (poly)neuropathy: Secondary | ICD-10-CM

## 2018-04-16 DIAGNOSIS — L84 Corns and callosities: Secondary | ICD-10-CM

## 2018-04-16 DIAGNOSIS — M2041 Other hammer toe(s) (acquired), right foot: Secondary | ICD-10-CM

## 2018-04-16 NOTE — Telephone Encounter (Signed)
Patient called at 2:12pm on 04/16/18 to cancel order for Diabetic Shoes.

## 2018-04-16 NOTE — Progress Notes (Signed)
Patient presents today for diabetic shoe measurement and foam casting.  Goals of diabetic shoes/inserts to offer protection from conditions secondary to DM2, offer relief from sheer forces that could lead to ulcerations, protect the foot, and offer greater stability. Patient is under supervision of DPM Evan Physician managing patients DM2: Rockney Ghee Patient has following documented conditions to qualify for diabetic shoes/inserts: PN, HT Patient measured with brannock device: 9W  Chose ortho [address]

## 2018-04-16 NOTE — Telephone Encounter (Signed)
I have canceled order thru [facility].

## 2018-04-19 ENCOUNTER — Ambulatory Visit: Payer: Medicare Other | Admitting: Family Medicine

## 2018-04-25 ENCOUNTER — Other Ambulatory Visit: Payer: Self-pay

## 2018-04-25 ENCOUNTER — Encounter: Payer: Self-pay | Admitting: Allergy & Immunology

## 2018-04-25 ENCOUNTER — Ambulatory Visit (INDEPENDENT_AMBULATORY_CARE_PROVIDER_SITE_OTHER): Payer: Medicare Other | Admitting: Family Medicine

## 2018-04-25 ENCOUNTER — Encounter: Payer: Self-pay | Admitting: Family Medicine

## 2018-04-25 VITALS — BP 122/74 | HR 88 | Temp 98.3°F | Resp 16 | Ht 61.0 in | Wt 191.1 lb

## 2018-04-25 DIAGNOSIS — G479 Sleep disorder, unspecified: Secondary | ICD-10-CM | POA: Diagnosis not present

## 2018-04-25 DIAGNOSIS — R600 Localized edema: Secondary | ICD-10-CM | POA: Diagnosis not present

## 2018-04-25 DIAGNOSIS — Z1211 Encounter for screening for malignant neoplasm of colon: Secondary | ICD-10-CM | POA: Diagnosis not present

## 2018-04-25 DIAGNOSIS — G4730 Sleep apnea, unspecified: Secondary | ICD-10-CM | POA: Diagnosis not present

## 2018-04-25 DIAGNOSIS — E1140 Type 2 diabetes mellitus with diabetic neuropathy, unspecified: Secondary | ICD-10-CM | POA: Diagnosis not present

## 2018-04-25 DIAGNOSIS — Z23 Encounter for immunization: Secondary | ICD-10-CM

## 2018-04-25 NOTE — Progress Notes (Signed)
Patient ID: Barbara House, female    DOB: [birth year], 62 y.o.   MRN: [id number]  Chief Complaint  Patient presents with  . Edema    follow up    Allergies Prednisone  Subjective:   Barbara House is a 62 y.o. female who presents to [facility] today.  HPI Barbara House presents today to follow-up on her swelling in her feet.  She reports that she was seen by podiatry and by the orthotics specialist.  She reports that they gave her a compression stocking that she has been wearing.  The compression stocking starts at her midfoot and goes up to the area above her ankle.  She reports that this is not helped the swelling but has made it worse.  She reports that above the compression anklet that she gets swelling and she reports that she also does not believe it fits right and it slides on her feet.  She would like to get some compression stockings that are below the knee.  She denies any lesions on her feet.  She is followed by dermatology for some occasional rashes on her lower extremities, Dr.Sheffield in [place].  She denies any lesions on her feet.  She does still have the neuropathy.  She reports that the swelling bothers her.  She reports she is taking all her medications.  Her blood pressures been running well.  She denies any cough or shortness of breath.  She would like to get her pneumonia shot today.  She also reports that she is due for her colonoscopy on July 09, 2018.  She would like to have this done in [place] with [place].  She had a prior colonoscopy done at [facility] in [place], [place]. In addition, she reports that she would like to have a sleep study done.  She reports that she does not snore but has irregular breathing when she sleeps.  She reports that her son tells her that she sleeps with her mouth open and makes noises.  She does gasp for breathing at times with sleeping.  She also endorses daytime fatigue.  She denies any  orthopnea.  No PND.  No cough.  No shortness of breath.  She has never been evaluated for sleep issues or obstructive sleep apnea.   Past Medical History:  Diagnosis Date  . Allergic rhinitis   . Anxiety disorder   . GERD (gastroesophageal reflux disease)   . History of migraine   . Hypertension   . Type 2 diabetes mellitus ([place])    Not sure when she was diagnosed, greater than five years.     Past Surgical History:  Procedure Laterality Date  . ABDOMINAL HYSTERECTOMY     has ovaries.   [name] CARDIAC CATHETERIZATION N/A 08/03/2016   Procedure: Left Heart Cath and Coronary Angiography;  Surgeon: Peter M Martinique, MD;  Location: [facility];  Service: Cardiovascular;  Laterality: N/A;  . Miinor skin surgery      Family History  Problem Relation Age of Onset  . Hypertension Mother   . Diabetes Mother   . Hypertension Son   . Diabetes Son   . Cancer Father   . Thyroid cancer Sister   . Cirrhosis Brother   . Alcohol abuse Brother      Social History   Socioeconomic History  . Marital status: Single    Spouse name: Not on file  . Number of children: Not on file  . Years of  education: Not on file  . Highest education level: Not on file  Occupational History  . Not on file  Social Needs  . Financial resource strain: Not on file  . Food insecurity:    Worry: Not on file    Inability: Not on file  . Transportation needs:    Medical: Not on file    Non-medical: Not on file  Tobacco Use  . Smoking status: Never Smoker  . Smokeless tobacco: Never Used  Substance and Sexual Activity  . Alcohol use: No    Alcohol/week: 0.0 oz  . Drug use: No  . Sexual activity: Not on file  Lifestyle  . Physical activity:    Days per week: 7 days    Minutes per session: 20 min  . Stress: Only a little  Relationships  . Social connections:    Talks on phone: Not on file    Gets together: Not on file    Attends religious service: Not on file    Active member of club or  organization: Not on file    Attends meetings of clubs or organizations: Not on file    Relationship status: Not on file  Other Topics Concern  . Not on file  Social History Narrative   Reports is on disability. Has one son. Lives in [place]. Enjoys time with family. Enjoys Firefighter. Attends church.    Current Outpatient Medications on File Prior to Visit  Medication Sig Dispense Refill  . aspirin EC 81 MG tablet Take 81 mg by mouth daily.    [name] atorvastatin (LIPITOR) 40 MG tablet Take 0.5 tablets (20 mg total) by mouth daily.    [name] augmented betamethasone dipropionate (DIPROLENE-AF) 0.05 % cream Apply 1 application topically 2 (two) times daily as needed (for itching).    . Azelastine HCl 0.15 % SOLN Place 1 spray into the nose at bedtime as needed. 30 mL 5  . cholecalciferol (VITAMIN D) 1000 units tablet Take 1,000 Units by mouth daily.    . cycloSPORINE (RESTASIS) 0.05 % ophthalmic emulsion Place 1 drop into both eyes 2 (two) times daily as needed (for eye dryness.).    [name] DEXILANT 30 MG capsule TAKE ONE CAPSULE BY MOUTH DAILY. 90 capsule 1  . fluticasone (FLONASE) 50 MCG/ACT nasal spray Place 1-2 sprays into both nostrils daily as needed (for sinus issues.). 16 g 5  . gabapentin (NEURONTIN) 100 MG capsule Take 1 capsule (100 mg total) by mouth 2 (two) times daily. 180 capsule 0  . glipiZIDE (GLUCOTROL XL) 10 MG 24 hr tablet TAKE ONE TABLET BY MOUTH DAILY. TAKE WITH BREAKFAST. 90 tablet 0  . hydrocortisone 2.5 % ointment Apply topically 2 (two) times daily. 30 g 0  . levocetirizine (XYZAL) 5 MG tablet Take 1 tablet (5 mg total) by mouth every evening. 30 tablet 3  . linagliptin (TRADJENTA) 5 MG TABS tablet Take 1 tablet (5 mg total) by mouth daily. 90 tablet 0  . lisinopril (PRINIVIL,ZESTRIL) 40 MG tablet Take 1 tablet (40 mg total) by mouth daily. 90 tablet 3  . metFORMIN (GLUCOPHAGE) 1000 MG tablet TAKE ONE TABLET BY MOUTH TWICE DAILY 60 tablet 0  . montelukast (SINGULAIR) 10 MG tablet  TAKE 1 TABLET (10 MG TOTAL) BY MOUTH AT BEDTIME AS NEEDED (FOR ALLERGIES.). 30 tablet 3  . nabumetone (RELAFEN) 750 MG tablet TAKE ONE TABLET BY MOUTH TWICE DAILY AS DIRECTED.  5  . Olopatadine HCl (PATANASE) 0.6 % SOLN Use 1-2 sprays each nostril daily 30.5  g 3  . PARoxetine (PAXIL) 40 MG tablet TAKE ONE TABLET BY MOUTH EVERY MORNING. 90 tablet 0  . PARoxetine (PAXIL) 40 MG tablet TAKE ONE TABLET BY MOUTH EVERY MORNING. 90 tablet 0  . ranitidine (ZANTAC) 150 MG tablet Take 150 mg by mouth 2 (two) times daily.  0  . traZODone (DESYREL) 50 MG tablet Take 50-100 mg by mouth at bedtime as needed for sleep.    [name] triamterene-hydrochlorothiazide (MAXZIDE-25) 37.5-25 MG tablet Take 1 tablet by mouth daily. 90 tablet 3  . UNABLE TO FIND Diabetic shoe 1 each 0  . verapamil (CALAN-SR) 240 MG CR tablet TAKE ONE TABLET BY MOUTH IN THE EVENING 90 tablet 0   No current facility-administered medications on file prior to visit.     Review of Systems  Constitutional: Positive for fatigue. Negative for appetite change, chills, fever and unexpected weight change.       Ports that she has had daytime fatigue for many years.  HENT: Negative for sore throat, trouble swallowing and voice change.        Reports that has had sinus and allergy issues for years.  Symptoms are better when she takes her Xyzal and Singulair.  Has no current sinus pain.  Does get occasional congestion.  Denies any shortness of breath.  Eyes: Negative for visual disturbance.  Respiratory: Negative for cough, chest tightness and wheezing.   Cardiovascular: Negative for chest pain, palpitations and leg swelling.  Gastrointestinal: Negative for abdominal distention, abdominal pain, blood in stool, constipation, nausea and rectal pain.  Genitourinary: Negative for decreased urine volume and dysuria.  Musculoskeletal: Negative for myalgias.  Skin: Negative for rash.  Neurological: Negative for dizziness, tremors, syncope, speech difficulty,  weakness, light-headedness and headaches.  Psychiatric/Behavioral: Negative for behavioral problems, dysphoric mood and suicidal ideas. The patient is not nervous/anxious.      Objective:   BP 122/74 (BP Location: Left Arm, Patient Position: Sitting, Cuff Size: Large)   Pulse 88   Temp 98.3 F (36.8 C) (Temporal)   Resp 16   Ht 5\' 1"  (1.549 m)   Wt 191 lb 1.9 oz (86.7 kg)   SpO2 99%   BMI 36.11 kg/m   Physical Exam  Constitutional: She is oriented to person, place, and time. She appears well-developed and well-nourished.  Cardiovascular: Normal rate, regular rhythm and normal heart sounds.  Pulmonary/Chest: Effort normal and breath sounds normal. No respiratory distress. She has no wheezes.  Neurological: She is alert and oriented to person, place, and time.  Skin: Skin is warm and dry.  No lesions on feet bilaterally.  2+ dorsalis pedis pulses bilaterally.  1+ edema and right lower extremity with anklet compression garment in wrong position.  It appears that the compression garment is larger than her foot and does not fit appropriately.  Vitals reviewed.    Assessment and Plan  1. Lower extremity edema At this time will give prescription for class I/15 to 20 mmHg bilateral below the knee compression hose.  Patient was counseled on use.  She was told if she develops any lesions on her legs or feet that she should follow-up with our office or podiatry and not use the compression garments. - DME Other see comment  2. Type 2 diabetes mellitus with diabetic neuropathy, without long-term current use of insulin ([place]) Vaccination given. - Pneumococcal conjugate vaccine 13-valent IM  3. Screen for colon cancer Patient is due for colonoscopy on 07/09/2018.  She request to have this done with [facility] [place] [facility].  Referral placed. - Ambulatory referral to Gastroenterology  4. Sleep disturbance Referral for sleep study placed.  5. Sleep-disordered breathing Refer for  sleep study placed. - Nocturnal polysomnography (NPSG); Future  Return in about 1 month (around 05/23/2018) for Follow-up with new PCP. Caren Macadam, MD 04/25/2018

## 2018-04-30 ENCOUNTER — Other Ambulatory Visit: Payer: Self-pay | Admitting: Family Medicine

## 2018-04-30 DIAGNOSIS — N182 Chronic kidney disease, stage 2 (mild): Principal | ICD-10-CM

## 2018-04-30 DIAGNOSIS — E1122 Type 2 diabetes mellitus with diabetic chronic kidney disease: Secondary | ICD-10-CM

## 2018-05-08 ENCOUNTER — Ambulatory Visit: Payer: Medicare Other | Admitting: Nurse Practitioner

## 2018-05-09 ENCOUNTER — Telehealth: Payer: Self-pay

## 2018-05-09 ENCOUNTER — Encounter: Payer: Self-pay | Admitting: Nurse Practitioner

## 2018-05-09 NOTE — Telephone Encounter (Signed)
Please call and find out which referral she is referencing and change the referral to her requested doctor. Please let her know. Thanks. [name]. Mannie Stabile, MD

## 2018-05-09 NOTE — Telephone Encounter (Signed)
Patient called stating she couldn't make her appointment to the doctor you referred her to and when she tried to reschedule her appointment they couldn't see her until October. She would like for you to put in another referral and she would like to see Dr. Tylene Fantasia Chitnavis in [place]. I told her I'd send you the message and then the referral would have to be worked.

## 2018-05-10 NOTE — Telephone Encounter (Signed)
Done

## 2018-05-27 ENCOUNTER — Ambulatory Visit: Payer: Medicare Other | Attending: Family Medicine | Admitting: Neurology

## 2018-05-27 DIAGNOSIS — Z7982 Long term (current) use of aspirin: Secondary | ICD-10-CM | POA: Insufficient documentation

## 2018-05-27 DIAGNOSIS — Z79899 Other long term (current) drug therapy: Secondary | ICD-10-CM | POA: Diagnosis not present

## 2018-05-27 DIAGNOSIS — G4730 Sleep apnea, unspecified: Secondary | ICD-10-CM | POA: Insufficient documentation

## 2018-05-27 DIAGNOSIS — Z7951 Long term (current) use of inhaled steroids: Secondary | ICD-10-CM | POA: Diagnosis not present

## 2018-05-27 DIAGNOSIS — R0683 Snoring: Secondary | ICD-10-CM | POA: Insufficient documentation

## 2018-05-27 DIAGNOSIS — Z7984 Long term (current) use of oral hypoglycemic drugs: Secondary | ICD-10-CM | POA: Diagnosis not present

## 2018-05-28 ENCOUNTER — Ambulatory Visit: Payer: Medicare Other | Admitting: Allergy & Immunology

## 2018-05-28 DIAGNOSIS — J309 Allergic rhinitis, unspecified: Secondary | ICD-10-CM

## 2018-05-31 ENCOUNTER — Other Ambulatory Visit (HOSPITAL_COMMUNITY)
Admission: RE | Admit: 2018-05-31 | Discharge: 2018-05-31 | Disposition: A | Discharge status: Home / Self Care | Payer: Medicare Other | Source: Ambulatory Visit | Attending: Family Medicine | Admitting: Family Medicine

## 2018-05-31 ENCOUNTER — Ambulatory Visit (HOSPITAL_COMMUNITY)
Admission: RE | Admit: 2018-05-31 | Discharge: 2018-05-31 | Disposition: A | Discharge status: Home / Self Care | Payer: Medicare Other | Source: Ambulatory Visit | Attending: Family Medicine | Admitting: Family Medicine

## 2018-05-31 ENCOUNTER — Encounter: Payer: Self-pay | Admitting: Family Medicine

## 2018-05-31 ENCOUNTER — Ambulatory Visit (INDEPENDENT_AMBULATORY_CARE_PROVIDER_SITE_OTHER): Payer: Medicare Other | Admitting: Family Medicine

## 2018-05-31 VITALS — BP 114/76 | HR 84 | Resp 16 | Ht 61.0 in | Wt 195.0 lb

## 2018-05-31 DIAGNOSIS — R69 Illness, unspecified: Secondary | ICD-10-CM | POA: Diagnosis not present

## 2018-05-31 DIAGNOSIS — M79662 Pain in left lower leg: Secondary | ICD-10-CM | POA: Diagnosis not present

## 2018-05-31 DIAGNOSIS — E119 Type 2 diabetes mellitus without complications: Secondary | ICD-10-CM

## 2018-05-31 DIAGNOSIS — R600 Localized edema: Secondary | ICD-10-CM | POA: Diagnosis not present

## 2018-05-31 LAB — BASIC METABOLIC PANEL
ANION GAP: 9 (ref 5–15)
BUN: 23 mg/dL — ABNORMAL HIGH (ref 6–20)
CHLORIDE: 103 mmol/L (ref 98–111)
CO2: 26 mmol/L (ref 22–32)
Calcium: 10 mg/dL (ref 8.9–10.3)
Creatinine, Ser: 1.11 mg/dL — ABNORMAL HIGH (ref 0.44–1.00)
GFR calc Af Amer: >60 mL/min (ref >60)
GFR, EST NON AFRICAN AMERICAN: 53 mL/min — AB (ref >60)
GLUCOSE: 168 mg/dL — AB (ref 70–99)
POTASSIUM: 3.9 mmol/L (ref 3.5–5.1)
Sodium: 138 mmol/L (ref 135–145)

## 2018-05-31 LAB — CK: CK TOTAL: 194 U/L (ref 38–234)

## 2018-05-31 NOTE — Progress Notes (Signed)
Patient ID: Barbara House, female    DOB: [birth year], 62 y.o.   MRN: [id number]  Chief Complaint  Patient presents with  . Hypertension    follow up visit   . Hyperlipidemia  . Diabetes  . Edema    some lower extremity swelling     Allergies Prednisone  Subjective:   Barbara House is a 62 y.o. female who presents to [facility] today.  HPI Here for pain in her left calf. Reports that has had pain in the left calf for about a month that has not gotten better and seems to be getting worse. Pain is there no matter what she does. Some days it is better than other but it has been everyday. Has never had a blood clot. Does not smoke. Feels like the left foot and ankle has been swollen along with the calf. Reports that the calf has been red.  Reports that she has not had any trauma or injury of her left leg or calf.  She reports that this is been bothering her tremendously.  She reports she has tried to elevate it but it still bothers her.  Leg Pain   The incident occurred more than 1 week ago (for about a month). There was no injury mechanism. The pain is present in the left leg (left calf). The quality of the pain is described as stabbing and aching. The pain is at a severity of 10/10. The pain is mild. The pain has been fluctuating since onset. Nothing (not sure what makes it worse. ) aggravates the symptoms. She has tried elevation, immobilization and rest for the symptoms. The treatment provided no relief.    Past Medical History:  Diagnosis Date  . Allergic rhinitis   . Anxiety disorder   . GERD (gastroesophageal reflux disease)   . History of migraine   . Hypertension   . Type 2 diabetes mellitus ([place])    Not sure when she was diagnosed, greater than five years.     Past Surgical History:  Procedure Laterality Date  . ABDOMINAL HYSTERECTOMY     has ovaries.   [name] CARDIAC CATHETERIZATION N/A 08/03/2016   Procedure: Left Heart Cath and Coronary Angiography;   Surgeon: Peter M Martinique, MD;  Location: [facility];  Service: Cardiovascular;  Laterality: N/A;  . Miinor skin surgery      Family History  Problem Relation Age of Onset  . Hypertension Mother   . Diabetes Mother   . Hypertension Son   . Diabetes Son   . Cancer Father   . Thyroid cancer Sister   . Cirrhosis Brother   . Alcohol abuse Brother      Social History   Socioeconomic History  . Marital status: Single    Spouse name: Not on file  . Number of children: Not on file  . Years of education: Not on file  . Highest education level: Not on file  Occupational History  . Not on file  Social Needs  . Financial resource strain: Not on file  . Food insecurity:    Worry: Not on file    Inability: Not on file  . Transportation needs:    Medical: Not on file    Non-medical: Not on file  Tobacco Use  . Smoking status: Never Smoker  . Smokeless tobacco: Never Used  Substance and Sexual Activity  . Alcohol use: No    Alcohol/week: 0.0 standard drinks  . Drug use: No  .  Sexual activity: Not on file  Lifestyle  . Physical activity:    Days per week: 7 days    Minutes per session: 20 min  . Stress: Only a little  Relationships  . Social connections:    Talks on phone: Not on file    Gets together: Not on file    Attends religious service: Not on file    Active member of club or organization: Not on file    Attends meetings of clubs or organizations: Not on file    Relationship status: Not on file  Other Topics Concern  . Not on file  Social History Narrative   Reports is on disability. Has one son. Lives in [place]. Enjoys time with family. Enjoys Firefighter. Attends church.    Current Outpatient Medications on File Prior to Visit  Medication Sig Dispense Refill  . aspirin EC 81 MG tablet Take 81 mg by mouth daily.    [name] atorvastatin (LIPITOR) 40 MG tablet Take 0.5 tablets (20 mg total) by mouth daily.    [name] augmented betamethasone dipropionate (DIPROLENE-AF)  0.05 % cream Apply 1 application topically 2 (two) times daily as needed (for itching).    . Azelastine HCl 0.15 % SOLN Place 1 spray into the nose at bedtime as needed. 30 mL 5  . cholecalciferol (VITAMIN D) 1000 units tablet Take 1,000 Units by mouth daily.    . cycloSPORINE (RESTASIS) 0.05 % ophthalmic emulsion Place 1 drop into both eyes 2 (two) times daily as needed (for eye dryness.).    [name] DEXILANT 30 MG capsule TAKE ONE CAPSULE BY MOUTH DAILY. 90 capsule 1  . fluticasone (FLONASE) 50 MCG/ACT nasal spray Place 1-2 sprays into both nostrils daily as needed (for sinus issues.). 16 g 5  . gabapentin (NEURONTIN) 100 MG capsule Take 1 capsule (100 mg total) by mouth 2 (two) times daily. 180 capsule 0  . glipiZIDE (GLUCOTROL XL) 10 MG 24 hr tablet TAKE ONE TABLET BY MOUTH DAILY. TAKE WITH BREAKFAST. 90 tablet 0  . hydrocortisone 2.5 % ointment Apply topically 2 (two) times daily. 30 g 0  . levocetirizine (XYZAL) 5 MG tablet Take 1 tablet (5 mg total) by mouth every evening. 30 tablet 3  . linagliptin (TRADJENTA) 5 MG TABS tablet Take 1 tablet (5 mg total) by mouth daily. 90 tablet 0  . lisinopril (PRINIVIL,ZESTRIL) 40 MG tablet Take 1 tablet (40 mg total) by mouth daily. 90 tablet 3  . metFORMIN (GLUCOPHAGE) 1000 MG tablet TAKE ONE TABLET BY MOUTH TWICE DAILY 60 tablet 0  . montelukast (SINGULAIR) 10 MG tablet TAKE 1 TABLET (10 MG TOTAL) BY MOUTH AT BEDTIME AS NEEDED (FOR ALLERGIES.). 30 tablet 3  . nabumetone (RELAFEN) 750 MG tablet TAKE ONE TABLET BY MOUTH TWICE DAILY AS DIRECTED.  5  . Olopatadine HCl (PATANASE) 0.6 % SOLN Use 1-2 sprays each nostril daily 30.5 g 3  . PARoxetine (PAXIL) 40 MG tablet TAKE ONE TABLET BY MOUTH EVERY MORNING. 90 tablet 0  . ranitidine (ZANTAC) 150 MG tablet Take 150 mg by mouth 2 (two) times daily.  0  . traZODone (DESYREL) 50 MG tablet Take 50-100 mg by mouth at bedtime as needed for sleep.    [name] triamterene-hydrochlorothiazide (MAXZIDE-25) 37.5-25 MG tablet Take 1  tablet by mouth daily. 90 tablet 3  . UNABLE TO FIND Diabetic shoe 1 each 0  . verapamil (CALAN-SR) 240 MG CR tablet TAKE ONE TABLET BY MOUTH IN THE EVENING 90 tablet 0   No  current facility-administered medications on file prior to visit.     Review of Systems   Objective:   BP 114/76   Pulse 84   Resp 16   Ht 5\' 1"  (1.549 m)   Wt 195 lb (88.5 kg)   SpO2 98%   BMI 36.84 kg/m   Physical Exam  Constitutional: She is oriented to person, place, and time. She appears well-developed and well-nourished. No distress.  HENT:  Head: Normocephalic and atraumatic.  Eyes: Pupils are equal, round, and reactive to light. EOM are normal.  Neck: Normal range of motion. Neck supple.  Cardiovascular: Normal rate, regular rhythm and normal heart sounds.  Pulmonary/Chest: Effort normal and breath sounds normal. No respiratory distress.  Abdominal: Soft. Bowel sounds are normal. She exhibits no distension. There is no tenderness.  Musculoskeletal:       Left lower leg: She exhibits tenderness and edema. She exhibits no bony tenderness, no swelling, no deformity and no laceration.  Left calf tenderness to palpation. No right calf TTP. Left calf muscle tender and tight. No appreciable warmth or redness in calves bilaterally. Trace edema bilaterally. No skin lesions bilaterally.   Right calf actually seems slightly larger than left calf.   Patient does have some muscle tenderness to palpation throughout most of her proximal and distal muscle groups.  However, she is definitely more tender to palpation in the left calf.  Bilateral scattered areas of hyperpigmentation in lower extremities consistent with chronic venous stasis.  Neurological: She is alert and oriented to person, place, and time.  Skin: Skin is warm and dry. Capillary refill takes less than 2 seconds. No rash noted. She is not diaphoretic.  Psychiatric: She has a normal mood and affect. Her behavior is normal. Judgment and thought  content normal.   Depression screen [facility] 2/9 05/31/2018 04/25/2018 03/21/2018 03/08/2018 10/12/2017  Decreased Interest 2 3 0 1 0  Down, Depressed, Hopeless 1 1 1  0 1  PHQ - 2 Score 3 4 1 1 1   Altered sleeping 1 1 - - -  Tired, decreased energy 1 1 - - -  Change in appetite 1 1 - - -  Feeling bad or failure about yourself  1 1 - - -  Trouble concentrating 1 1 - - -  Moving slowly or fidgety/restless 2 1 - - -  Suicidal thoughts 0 0 - - -  PHQ-9 Score 10 10 - - -  Difficult doing work/chores Somewhat difficult Somewhat difficult - - -     Assessment and Plan  1. Pain of left calf Patient with pain and significant tenderness to palpation of left calf.  At this time we will check a LLE duplex ultrasound to rule out DVT.  We will also check blood work to make sure she is not having myalgias associated with her cholesterol medication, since she does have some diffuse, general tenderness.  She actually describes that she gets tender in multiple muscle groups although she does have definite greater tenderness in her left calf with examination.  Patient has complaint of chronic lower extremity edema and has been unable to tolerate venous compression stockings and is already on diuretic.  Will place referral to vein and vascular for evaluation if this Doppler of her lower extremities is negative for DVT.  Called and patient will go to any pain hospital now for stat ultrasound.  She will also get her labs done at the hospital.  Radiology will call with results.  Patient will be held  in radiology until given results due to the fact that she does not have a way for [place] to contact her via cell phone.  She was counseled regarding worrisome signs and symptoms and if those develop she should go to the emergency department. 2. Type 2 diabetes mellitus without complication, without long-term current use of insulin ([place]) Follow up for diabetes care.  Foot care discussed.  - Ambulatory referral to  Ophthalmology  Return in about 2 weeks (around 06/14/2018). Caren Macadam, MD 05/31/2018

## 2018-06-04 ENCOUNTER — Telehealth: Payer: Self-pay | Admitting: Family Medicine

## 2018-06-04 NOTE — Telephone Encounter (Signed)
New Message  Pt verbalized someone called her in regards to her results but I did not see any notes.  Please f/u

## 2018-06-04 NOTE — Telephone Encounter (Signed)
Spoke with patient and gave ultrasound results with verbal understanding

## 2018-06-05 NOTE — Procedures (Signed)
[place] A. Merlene Laughter, MD     [email or web address]             NOCTURNAL POLYSOMNOGRAPHY   LOCATION: [facility]   Patient Name: Barbara House, Barbara House Date: 05/27/2018 Gender: Female D.O.B: July 23, 1956 Age (years): 61 Referring Provider: Caren Macadam Height (inches): 61 Interpreting Physician: Phillips Odor MD, ABSM Weight (lbs): 191 RPSGT: Rosebud Poles BMI: 36 MRN: [id number] Neck Size: 16.00 CLINICAL INFORMATION Sleep Study Type: NPSG     Indication for sleep study: N/A     Epworth Sleepiness Score: 12     SLEEP STUDY TECHNIQUE As per the AASM Manual for the Scoring of Sleep and Associated Events v2.3 (April 2016) with a hypopnea requiring 4% desaturations.  The channels recorded and monitored were frontal, central and occipital EEG, electrooculogram (EOG), submentalis EMG (chin), nasal and oral airflow, thoracic and abdominal wall motion, anterior tibialis EMG, snore microphone, electrocardiogram, and pulse oximetry.  MEDICATIONS Medications self-administered by patient taken the night of the study : N/A  Current Outpatient Medications:  .  aspirin EC 81 MG tablet, Take 81 mg by mouth daily., Disp: , Rfl:  .  atorvastatin (LIPITOR) 40 MG tablet, Take 0.5 tablets (20 mg total) by mouth daily., Disp: , Rfl:  .  augmented betamethasone dipropionate (DIPROLENE-AF) 0.05 % cream, Apply 1 application topically 2 (two) times daily as needed (for itching)., Disp: , Rfl:  .  Azelastine HCl 0.15 % SOLN, Place 1 spray into the nose at bedtime as needed., Disp: 30 mL, Rfl: 5 .  cholecalciferol (VITAMIN D) 1000 units tablet, Take 1,000 Units by mouth daily., Disp: , Rfl:  .  cycloSPORINE (RESTASIS) 0.05 % ophthalmic emulsion, Place 1 drop into both eyes 2 (two) times daily as needed (for eye dryness.)., Disp: , Rfl:  .  DEXILANT 30 MG capsule, TAKE ONE CAPSULE BY MOUTH DAILY., Disp: 90 capsule, Rfl: 1 .  fluticasone (FLONASE) 50 MCG/ACT nasal spray,  Place 1-2 sprays into both nostrils daily as needed (for sinus issues.)., Disp: 16 g, Rfl: 5 .  gabapentin (NEURONTIN) 100 MG capsule, Take 1 capsule (100 mg total) by mouth 2 (two) times daily., Disp: 180 capsule, Rfl: 0 .  glipiZIDE (GLUCOTROL XL) 10 MG 24 hr tablet, TAKE ONE TABLET BY MOUTH DAILY. TAKE WITH BREAKFAST., Disp: 90 tablet, Rfl: 0 .  hydrocortisone 2.5 % ointment, Apply topically 2 (two) times daily., Disp: 30 g, Rfl: 0 .  levocetirizine (XYZAL) 5 MG tablet, Take 1 tablet (5 mg total) by mouth every evening., Disp: 30 tablet, Rfl: 3 .  linagliptin (TRADJENTA) 5 MG TABS tablet, Take 1 tablet (5 mg total) by mouth daily., Disp: 90 tablet, Rfl: 0 .  lisinopril (PRINIVIL,ZESTRIL) 40 MG tablet, Take 1 tablet (40 mg total) by mouth daily., Disp: 90 tablet, Rfl: 3 .  metFORMIN (GLUCOPHAGE) 1000 MG tablet, TAKE ONE TABLET BY MOUTH TWICE DAILY, Disp: 60 tablet, Rfl: 0 .  montelukast (SINGULAIR) 10 MG tablet, TAKE 1 TABLET (10 MG TOTAL) BY MOUTH AT BEDTIME AS NEEDED (FOR ALLERGIES.)., Disp: 30 tablet, Rfl: 3 .  nabumetone (RELAFEN) 750 MG tablet, TAKE ONE TABLET BY MOUTH TWICE DAILY AS DIRECTED., Disp: , Rfl: 5 .  Olopatadine HCl (PATANASE) 0.6 % SOLN, Use 1-2 sprays each nostril daily, Disp: 30.5 g, Rfl: 3 .  PARoxetine (PAXIL) 40 MG tablet, TAKE ONE TABLET BY MOUTH EVERY MORNING., Disp: 90 tablet, Rfl: 0 .  ranitidine (ZANTAC) 150 MG tablet, Take 150 mg by mouth 2 (two) times daily., Disp: ,  Rfl: 0 .  traZODone (DESYREL) 50 MG tablet, Take 50-100 mg by mouth at bedtime as needed for sleep., Disp: , Rfl:  .  triamterene-hydrochlorothiazide (MAXZIDE-25) 37.5-25 MG tablet, Take 1 tablet by mouth daily., Disp: 90 tablet, Rfl: 3 .  UNABLE TO FIND, Diabetic shoe, Disp: 1 each, Rfl: 0 .  verapamil (CALAN-SR) 240 MG CR tablet, TAKE ONE TABLET BY MOUTH IN THE EVENING, Disp: 90 tablet, Rfl: 0     SLEEP ARCHITECTURE The study was initiated at 11:00:01 PM and ended at 5:26:58 AM.  Sleep onset time  was 0.3 minutes and the sleep efficiency was 95.4%%. The total sleep time was 369.2 minutes.  Stage REM latency was 344.5 minutes.  The patient spent 1.4%% of the night in stage N1 sleep, 71.4%% in stage N2 sleep, 16.0%% in stage N3 and 11.2% in REM.  Alpha intrusion was absent.  Supine sleep was 80.53%.  RESPIRATORY PARAMETERS The overall apnea/hypopnea index (AHI) was 4.1 per hour. There were 8 total apneas, including 1 obstructive, 3 central and 4 mixed apneas. There were 17 hypopneas and 0 RERAs.  The AHI during Stage REM sleep was 26.0 per hour.  AHI while supine was 1.4 per hour.  The mean oxygen saturation was 92.9%. The minimum SpO2 during sleep was 83.0%.  moderate snoring was noted during this study.  CARDIAC DATA The 2 lead EKG demonstrated sinus rhythm. The mean heart rate was 72.7 beats per minute. Other EKG findings include: None. LEG MOVEMENT DATA The total PLMS were 0 with a resulting PLMS index of 0.0. Associated arousal with leg movement index was 12.7.  IMPRESSIONS No significant obstructive sleep apnea occurred during this study. No significant central sleep apnea occurred during this study.  Delano Metz, MD Diplomate, American Board of Sleep Medicine. ELECTRONICALLY SIGNED ON:  06/05/2018, 2:04 PM [place] PH: [phone]   FX: [phone] [place]

## 2018-06-05 NOTE — Telephone Encounter (Signed)
New Message  Pt wants nurse to call her back in regards to her ultrasound.  Please f/u

## 2018-06-05 NOTE — Telephone Encounter (Signed)
Spoke with patient again and gave results of recent ultrasound. Advised her to keep her follow up with specialist and they will call with an appointment. Patient verbalized understanding.

## 2018-06-07 DIAGNOSIS — Z7984 Long term (current) use of oral hypoglycemic drugs: Secondary | ICD-10-CM | POA: Diagnosis not present

## 2018-06-07 DIAGNOSIS — R109 Unspecified abdominal pain: Secondary | ICD-10-CM | POA: Diagnosis not present

## 2018-06-07 DIAGNOSIS — R1010 Upper abdominal pain, unspecified: Secondary | ICD-10-CM | POA: Diagnosis not present

## 2018-06-07 DIAGNOSIS — R1013 Epigastric pain: Secondary | ICD-10-CM | POA: Diagnosis not present

## 2018-06-07 DIAGNOSIS — E089 Diabetes mellitus due to underlying condition without complications: Secondary | ICD-10-CM | POA: Diagnosis not present

## 2018-06-07 DIAGNOSIS — R1084 Generalized abdominal pain: Secondary | ICD-10-CM | POA: Diagnosis not present

## 2018-06-07 DIAGNOSIS — Z791 Long term (current) use of non-steroidal anti-inflammatories (NSAID): Secondary | ICD-10-CM | POA: Diagnosis not present

## 2018-06-07 DIAGNOSIS — E119 Type 2 diabetes mellitus without complications: Secondary | ICD-10-CM | POA: Diagnosis not present

## 2018-06-07 DIAGNOSIS — Z79899 Other long term (current) drug therapy: Secondary | ICD-10-CM | POA: Diagnosis not present

## 2018-06-07 DIAGNOSIS — Z7982 Long term (current) use of aspirin: Secondary | ICD-10-CM | POA: Diagnosis not present

## 2018-06-07 DIAGNOSIS — I10 Essential (primary) hypertension: Secondary | ICD-10-CM | POA: Diagnosis not present

## 2018-06-07 DIAGNOSIS — Z888 Allergy status to other drugs, medicaments and biological substances status: Secondary | ICD-10-CM | POA: Diagnosis not present

## 2018-06-09 ENCOUNTER — Other Ambulatory Visit: Payer: Self-pay | Admitting: Allergy & Immunology

## 2018-06-10 ENCOUNTER — Telehealth: Payer: Self-pay

## 2018-06-10 DIAGNOSIS — G4733 Obstructive sleep apnea (adult) (pediatric): Secondary | ICD-10-CM | POA: Diagnosis not present

## 2018-06-10 NOTE — Telephone Encounter (Signed)
Patient called in and stated that she had been vomiting for several days and moving around seemed to make it worse. I advised the patient to go to the nearest ED for evaluation and treatment. She stated she had went to the ED Friday to be seen. She asked if there was anywhere she could go that was quiet that she could get some rest and not hear the phone ring and stuff. I told her to turn the ringers off on the phones and go to her room and lock the door and tell her family to leave her alone and let her rest. Her children are old enough to leave her alone and let her get some rest and they can do for themselves. She verbalized understanding.

## 2018-06-11 ENCOUNTER — Other Ambulatory Visit: Payer: Self-pay

## 2018-06-11 DIAGNOSIS — M79604 Pain in right leg: Secondary | ICD-10-CM

## 2018-06-11 DIAGNOSIS — M79605 Pain in left leg: Principal | ICD-10-CM

## 2018-06-11 NOTE — Telephone Encounter (Signed)
Spoke with patient to see how she is doing today. She stated she is feeling a little better and that she was able to get some rest and spend some time alone yesterday. I encouraged her to turn the ringer off on the telephone unless she was expecting a very important phone call from someone and make time to spend alone with herself so that she was able to recover and relax. She verbalized understanding.

## 2018-06-11 NOTE — Telephone Encounter (Signed)
Please call patient and see how she is doing today. 

## 2018-06-13 ENCOUNTER — Telehealth: Payer: Self-pay | Admitting: Family Medicine

## 2018-06-13 NOTE — Telephone Encounter (Signed)
PATIENT IS REQUESTING SLEEP STUDY RESULTS  CB# [phone]

## 2018-06-14 NOTE — Telephone Encounter (Signed)
Called to speak with patient regarding sleep study. No answer and unable to leave a message. Will try again later.

## 2018-06-15 ENCOUNTER — Other Ambulatory Visit: Payer: Self-pay | Admitting: Allergy & Immunology

## 2018-06-18 ENCOUNTER — Ambulatory Visit: Payer: Medicare Other | Admitting: Allergy & Immunology

## 2018-06-19 ENCOUNTER — Other Ambulatory Visit: Payer: Self-pay | Admitting: Family Medicine

## 2018-06-19 ENCOUNTER — Other Ambulatory Visit: Payer: Self-pay | Admitting: Allergy & Immunology

## 2018-06-19 ENCOUNTER — Other Ambulatory Visit: Payer: Self-pay

## 2018-06-19 DIAGNOSIS — N182 Chronic kidney disease, stage 2 (mild): Principal | ICD-10-CM

## 2018-06-19 DIAGNOSIS — R1013 Epigastric pain: Secondary | ICD-10-CM

## 2018-06-19 DIAGNOSIS — E1122 Type 2 diabetes mellitus with diabetic chronic kidney disease: Secondary | ICD-10-CM

## 2018-06-19 DIAGNOSIS — E1142 Type 2 diabetes mellitus with diabetic polyneuropathy: Secondary | ICD-10-CM

## 2018-06-19 MED ORDER — DEXLANSOPRAZOLE 30 MG PO CPDR: 1.0000 | DELAYED_RELEASE_CAPSULE | Freq: Every day | ORAL | 0 refills | Status: DC

## 2018-06-20 NOTE — Telephone Encounter (Signed)
Spoke with patient and gave her the results of the sleep study with verbal understanding.

## 2018-06-21 ENCOUNTER — Other Ambulatory Visit: Payer: Self-pay | Admitting: Family Medicine

## 2018-06-21 DIAGNOSIS — N182 Chronic kidney disease, stage 2 (mild): Secondary | ICD-10-CM

## 2018-06-21 DIAGNOSIS — I10 Essential (primary) hypertension: Secondary | ICD-10-CM

## 2018-06-21 DIAGNOSIS — E1122 Type 2 diabetes mellitus with diabetic chronic kidney disease: Secondary | ICD-10-CM

## 2018-06-25 ENCOUNTER — Ambulatory Visit (INDEPENDENT_AMBULATORY_CARE_PROVIDER_SITE_OTHER): Payer: Medicare Other | Admitting: Allergy & Immunology

## 2018-06-25 ENCOUNTER — Encounter: Payer: Self-pay | Admitting: Allergy & Immunology

## 2018-06-25 VITALS — BP 116/70 | HR 95 | Resp 18 | Ht 61.0 in | Wt 192.2 lb

## 2018-06-25 DIAGNOSIS — B999 Unspecified infectious disease: Secondary | ICD-10-CM

## 2018-06-25 DIAGNOSIS — K219 Gastro-esophageal reflux disease without esophagitis: Secondary | ICD-10-CM | POA: Diagnosis not present

## 2018-06-25 DIAGNOSIS — J0101 Acute recurrent maxillary sinusitis: Secondary | ICD-10-CM

## 2018-06-25 MED ORDER — AMOXICILLIN-POT CLAVULANATE 875-125 MG PO TABS: 1.0000 | ORAL_TABLET | Freq: Two times a day (BID) | ORAL | 0 refills | Status: AC

## 2018-06-25 NOTE — Progress Notes (Signed)
FOLLOW UP  Date of Service/Encounter:  06/25/18   Assessment:   Perennial allergic rhinitis  Chronic rhinosinusitis  Gastroesophageal reflux disease - controlled on Dexliant  Recurrent infections  Plan/Recommendations:   1. Perennial allergic rhinitis - Continue with recommend that you use nasal sprays on a daily basis: nasal saline rinses twice daily + Flonase two sprays per nostril daily + Patanase two sprays per nostril 2 times daily - Continue with montelukast 10mg  daily. - Continue with Xyzal 5mg  daily.  - These medications need to be used EVERY DAY for the best effect. - Start the prednisone pack if you are not feeling better in a few days. - Start Augmentin 875mg  twice daily for ten days (take with food). - Start Muxinex 600-1200mg  twice daily.  - We will get your records from Dr. Benjamine Mola to see what he recommended.    2. GERD (reflux) - Continue with Dexilant 30mg  daily and Ranitidine 150mg  2 times a day.  3. Recurrent infections - She has a history of recurrent infections, in particular sinusitis. - We will obtain some screening labs to evaluate her immune system.  - Labs to evaluate the quantitative aspects of her immune system: IgG/IgA/IgM, CBC with differential - Labs to evaluate the qualitative aspects of her immune system: CH50, Pneumococcal titers, Tetanus titers, Diphtheria titers - We may consider immunizations with Pneumovax and Tdap to challenge her immune system, and then obtain repeat titers in 4-6 weeks.  - We may consider obtaining flow cytometry at future visits, if indicated.   4. Return in about 6 months (around 12/24/2018).   Subjective:   Barbara House is a 62 y.o. female presenting today for follow up of  Chief Complaint  Patient presents with  . Allergic Rhinitis     Barbara House has a history of the following: Patient Active Problem List   Diagnosis Date Noted  . Hyperlipidemia LDL goal <70 10/12/2017  . Anxiety disorder     . Type 2 diabetes mellitus ([place])   . Hypertension   . Allergic rhinitis   . Angina pectoris ([place]) 08/03/2016  . Abnormal nuclear stress test 08/03/2016  . Abnormal myocardial perfusion study   . Rhinitis 06/25/2015  . GERD (gastroesophageal reflux disease) 06/25/2015  . Diabetes mellitus ([place]) 06/25/2015    History obtained from: chart review  and patient.  Barbara House's Primary Care Provider is Caren Macadam, MD.     Barbara House is a 62 y.o. female presenting for a sick visit.  She was last seen in June 2018 by me.  At that time, I recommended using nasal saline rinses in combination with Flonase and Patanase.  We continued with montelukast 10 mg daily as well as Xyzal 5 mg daily.  We had discussed allergy shots, but her last blood testing in November 2018 was negative to the entire panel.  In the interim, she was seen by Dr. Nelva Bush due to continued nasal drainage.  She was continued on Dexilant and ranitidine.  Since the last visit, she reports that she is having "sinus". Everything that she eats is "bitter, salty, sour". She has been like this for two months. She is not using her nasal sprays on a regular basis, as emphasized on a multitude of occasions.  She denies multiple antibiotic use, however, her history is rather vague.  She makes references to other antibiotics that she has needed since the last visit.  It is difficult to get an idea of how many she has needed.  She did have dental work done last Monday.  He did not feel that there was a dental abscess or anything.  Otherwise, there have been no changes to her past medical history, surgical history, family history, or social history.    Review of Systems: a 14-point review of systems is pertinent for what is mentioned in HPI.  Otherwise, all other systems were negative. Constitutional: negative other than that listed in the HPI Eyes: negative other than that listed in the HPI Ears, nose, mouth, throat, and face: negative  other than that listed in the HPI Respiratory: negative other than that listed in the HPI Cardiovascular: negative other than that listed in the HPI Gastrointestinal: negative other than that listed in the HPI Genitourinary: negative other than that listed in the HPI Integument: negative other than that listed in the HPI Hematologic: negative other than that listed in the HPI Musculoskeletal: negative other than that listed in the HPI Neurological: negative other than that listed in the HPI Allergy/Immunologic: negative other than that listed in the HPI    Objective:   Blood pressure 116/70, pulse 95, resp. rate 18, height 5\' 1"  (1.549 m), weight 192 lb 3.2 oz (87.2 kg), SpO2 99 %. Body mass index is 36.32 kg/m.   Physical Exam:  General: Alert, interactive, in no acute distress. Talkative. Difficult to get a word in edgewise in.  Eyes: No conjunctival injection bilaterally, no discharge on the right, no discharge on the left and no Horner-Trantas dots present. PERRL bilaterally. EOMI without pain. No photophobia.  Ears: Right TM pearly gray with normal light reflex, Left TM pearly gray with normal light reflex, Right TM intact without perforation and Left TM intact without perforation.  Nose/Throat: External nose within normal limits and septum midline. Turbinates edematous and pale with clear discharge. Posterior oropharynx erythematous without cobblestoning in the posterior oropharynx. Tonsils 2+ without exudates.  Tongue without thrush. Lungs: Clear to auscultation without wheezing, rhonchi or rales. No increased work of breathing. CV: Normal S1/S2. No murmurs. Capillary refill <2 seconds.  Skin: Warm and dry, without lesions or rashes. Neuro:   Grossly intact. No focal deficits appreciated. Responsive to questions.  Diagnostic studies: none     Salvatore Marvel, MD  [facility]

## 2018-06-25 NOTE — Patient Instructions (Addendum)
1. Perennial allergic rhinitis - Continue with recommend that you use nasal sprays on a daily basis: nasal saline rinses twice daily + Flonase two sprays per nostril daily + Patanase two sprays per nostril 2 times daily - Continue with montelukast 10mg  daily. - Continue with Xyzal 5mg  daily.  - These medications need to be used EVERY DAY for the best effect. - Start the prednisone pack if you are not feeling better in a few days. - Start Augmentin 875mg  twice daily for ten days (take with food). - Start Muxinex 600-1200mg  twice daily.  - We will get your records from Dr. Benjamine Mola to see what he recommended.    2. GERD (reflux) - Continue with Dexilant 30mg  daily and Ranitidine 150mg  2 times a day.  3. Recurrent infections - We are going to get some labs to look at your immune system. - We will call you in 1-2 weeks with the results.   4. Return in about 6 months (around 12/24/2018).    Please inform us of any Emergency Department visits, hospitalizations, or changes in symptoms. Call us before going to the ED for breathing or allergy symptoms since we might be able to fit you in for a sick visit. Feel free to contact us anytime with any questions, problems, or concerns.  It was a pleasure to see you again today!  Websites that have reliable patient information: 1. American Academy of Asthma, Allergy, and Immunology: www.aaaai.org 2. Food Allergy Research and Education (FARE): foodallergy.org 3. Mothers of Asthmatics: http://www.asthmacommunitynetwork.org 4. American College of Allergy, Asthma, and Immunology: MonthlyElectricBill.co.uk   Make sure you are registered to vote! If you have moved or changed any of your contact information, you will need to get this updated before voting!

## 2018-07-01 ENCOUNTER — Encounter: Payer: Self-pay | Admitting: Vascular Surgery

## 2018-07-01 ENCOUNTER — Ambulatory Visit (HOSPITAL_COMMUNITY)
Admission: RE | Admit: 2018-07-01 | Discharge: 2018-07-01 | Disposition: A | Discharge status: Home / Self Care | Payer: Medicare Other | Source: Ambulatory Visit | Attending: Vascular Surgery | Admitting: Vascular Surgery

## 2018-07-01 ENCOUNTER — Ambulatory Visit (INDEPENDENT_AMBULATORY_CARE_PROVIDER_SITE_OTHER): Payer: Medicare Other | Admitting: Vascular Surgery

## 2018-07-01 VITALS — BP 146/79 | HR 61 | Temp 97.3°F | Resp 20 | Ht 61.0 in | Wt 190.0 lb

## 2018-07-01 DIAGNOSIS — M79605 Pain in left leg: Secondary | ICD-10-CM | POA: Insufficient documentation

## 2018-07-01 DIAGNOSIS — M79604 Pain in right leg: Secondary | ICD-10-CM | POA: Insufficient documentation

## 2018-07-01 DIAGNOSIS — I70223 Atherosclerosis of native arteries of extremities with rest pain, bilateral legs: Secondary | ICD-10-CM | POA: Diagnosis not present

## 2018-07-01 NOTE — Progress Notes (Signed)
[facility]  Patient name: Barbara House MRN: [id number] DOB: 1956/09/04 Sex: female  REASON FOR CONSULT: Evaluation pain lower extremities left greater than right  Seen today in our [place] office   HPI: Barbara House is a 62 y.o. female, who is here today for evaluation of lower extremity discomfort.  She has several different components of this.  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  Recently she is having worsening discomfort in her left calf and left knee.  It undergone a noninvasive venous study at [facility] 1 month ago.  Is seeing me today with arterial study as well.  She does not have any claudication type symptoms.  She does have some moderate swelling.  She does have some thickening in her skin on both lower extremities and has associated with this with the prior topical agents.  Past Medical History:  Diagnosis Date  . Allergic rhinitis   . Anxiety disorder   . GERD (gastroesophageal reflux disease)   . History of migraine   . Hypertension   . Type 2 diabetes mellitus ([place])    Not sure when she was diagnosed, greater than five years.     Family History  Problem Relation Age of Onset  . Hypertension Mother   . Diabetes Mother   . Hypertension Son   . Diabetes Son   . Cancer Father   . Thyroid cancer Sister   . Cirrhosis Brother   . Alcohol abuse Brother     SOCIAL HISTORY: Social History   Socioeconomic History  . Marital status: Single    Spouse name: Not on file  . Number of children: Not on file  . Years of education: Not on file  . Highest education level: Not on file  Occupational History  . Not on file  Social Needs  . Financial resource strain: Not on file  . Food insecurity:    Worry: Not on file    Inability: Not on file  . Transportation needs:    Medical: Not on file    Non-medical: Not on file  Tobacco Use  . Smoking status: Never  Smoker  . Smokeless tobacco: Never Used  Substance and Sexual Activity  . Alcohol use: No    Alcohol/week: 0.0 standard drinks  . Drug use: No  . Sexual activity: Not on file  Lifestyle  . Physical activity:    Days per week: 7 days    Minutes per session: 20 min  . Stress: Only a little  Relationships  . Social connections:    Talks on phone: Not on file    Gets together: Not on file    Attends religious service: Not on file    Active member of club or organization: Not on file    Attends meetings of clubs or organizations: Not on file    Relationship status: Not on file  . Intimate partner violence:    Fear of current or ex partner: Not on file    Emotionally abused: Not on file    Physically abused: Not on file    Forced sexual activity: Not on file  Other Topics Concern  . Not on file  Social History Narrative   Reports is on disability. Has one son. Lives in [place]. Enjoys time with family. Enjoys Firefighter. Attends church.     Allergies  Allergen Reactions  . Prednisone Other (See Comments)    Hair loss  Current Outpatient Medications  Medication Sig Dispense Refill  . amoxicillin-clavulanate (AUGMENTIN) 875-125 MG tablet Take 1 tablet by mouth 2 (two) times daily for 10 days. 20 tablet 0  . aspirin EC 81 MG tablet Take 81 mg by mouth daily.    [name] atorvastatin (LIPITOR) 40 MG tablet Take 0.5 tablets (20 mg total) by mouth daily.    [name] augmented betamethasone dipropionate (DIPROLENE-AF) 0.05 % cream Apply 1 application topically 2 (two) times daily as needed (for itching).    . Azelastine HCl 0.15 % SOLN Place 1 spray into the nose at bedtime as needed. 30 mL 5  . cholecalciferol (VITAMIN D) 1000 units tablet Take 1,000 Units by mouth daily.    . cycloSPORINE (RESTASIS) 0.05 % ophthalmic emulsion Place 1 drop into both eyes 2 (two) times daily as needed (for eye dryness.).    [name] Dexlansoprazole (DEXILANT) 30 MG capsule Take 1 capsule (30 mg total) by mouth daily. 90  capsule 0  . fluticasone (FLONASE) 50 MCG/ACT nasal spray Place 1-2 sprays into both nostrils daily as needed (for sinus issues.). 16 g 5  . gabapentin (NEURONTIN) 100 MG capsule TAKE ONE CAPSULE BY MOUTH TWICE DAILY. 180 capsule 0  . glipiZIDE (GLUCOTROL XL) 10 MG 24 hr tablet TAKE ONE TABLET BY MOUTH DAILY. TAKE WITH BREAKFAST. 90 tablet 0  . hydrocortisone 2.5 % ointment Apply topically 2 (two) times daily. 30 g 0  . levocetirizine (XYZAL) 5 MG tablet Take 1 tablet (5 mg total) by mouth every evening. 30 tablet 3  . linagliptin (TRADJENTA) 5 MG TABS tablet Take 1 tablet (5 mg total) by mouth daily. 90 tablet 0  . lisinopril (PRINIVIL,ZESTRIL) 40 MG tablet TAKE ONE TABLET BY MOUTH DAILY. 90 tablet 0  . metFORMIN (GLUCOPHAGE) 1000 MG tablet TAKE ONE TABLET BY MOUTH TWICE DAILY 180 tablet 0  . montelukast (SINGULAIR) 10 MG tablet TAKE 1 TABLET (10 MG TOTAL) BY MOUTH AT BEDTIME AS NEEDED (FOR ALLERGIES.). 30 tablet 1  . nabumetone (RELAFEN) 750 MG tablet TAKE ONE TABLET BY MOUTH TWICE DAILY AS DIRECTED.  5  . Olopatadine HCl (PATANASE) 0.6 % SOLN Use 1-2 sprays each nostril daily 30.5 g 3  . PARoxetine (PAXIL) 40 MG tablet TAKE ONE TABLET BY MOUTH EVERY MORNING. 90 tablet 0  . ranitidine (ZANTAC) 150 MG tablet Take 150 mg by mouth 2 (two) times daily.  0  . traZODone (DESYREL) 50 MG tablet Take 50-100 mg by mouth at bedtime as needed for sleep.    [name] triamterene-hydrochlorothiazide (MAXZIDE-25) 37.5-25 MG tablet Take 1 tablet by mouth daily. 90 tablet 3  . UNABLE TO FIND Diabetic shoe 1 each 0  . verapamil (CALAN-SR) 240 MG CR tablet TAKE ONE TABLET BY MOUTH EVERY EVENING. 90 tablet 0   No current facility-administered medications for this visit.     REVIEW OF SYSTEMS:  [X]  denotes positive finding, [ ]  denotes negative finding Cardiac  Comments:  Chest pain or chest pressure: x   Shortness of breath upon exertion: x   Short of breath when lying flat: x   Irregular heart rhythm:          Vascular    Pain in calf, thigh, or hip brought on by ambulation: x   Pain in feet at night that wakes you up from your sleep:  x   Blood clot in your veins:    Leg swelling:  x       Pulmonary    Oxygen at home:  Productive cough:  x   Wheezing:         Neurologic    Sudden weakness in arms or legs:  x   Sudden numbness in arms or legs:  x   Sudden onset of difficulty speaking or slurred speech:    Temporary loss of vision in one eye:     Problems with dizziness:  x       Gastrointestinal    Blood in stool:     Vomited blood:         Genitourinary    Burning when urinating:     Blood in urine:        Psychiatric    Major depression:  x       Hematologic    Bleeding problems:    Problems with blood clotting too easily:        Skin    Rashes or ulcers: x       Constitutional    Fever or chills: x     PHYSICAL EXAM: Vitals:   07/01/18 1139  BP: (!) 146/79  Pulse: 61  Resp: 20  Temp: (!) 97.3 F (36.3 C)  TempSrc: Temporal  Weight: 190 lb (86.2 kg)  Height: 5\' 1"  (1.549 m)    GENERAL: The patient is a well-nourished female, in no acute distress. The vital signs are documented above. CARDIOVASCULAR: Carotid arteries without bruits bilaterally.  2+ radial pulses.  2+ dorsalis pedis pulses bilaterally PULMONARY: There is good air exchange  ABDOMEN: Soft and non-tender  MUSCULOSKELETAL: There are no major deformities or cyanosis. NEUROLOGIC: No focal weakness or paresthesias are detected. SKIN: Thickening of the skin from her knees down onto her ankles bilaterally with no swelling and no ulceration currently PSYCHIATRIC: The patient has a normal affect.  DATA:  Noninvasive venous studies from 1 month ago at [facility] showed no evidence of DVT or reflux  Noninvasive arterial studies today showed normal ankle arm index and waveforms suggesting no evidence of arterial insufficiency  MEDICAL ISSUES: I discussed these findings at length with the  patient.  I am not sure as to the etiology of her calf and knee discomfort but suspect this may be musculoskeletal.  She has no evidence of arterial or venous pathology.  She will see [place] again on an as-needed basis   Rosetta Posner, MD [place]  [facility] Tel [phone] Pager [phone]

## 2018-07-02 ENCOUNTER — Encounter: Payer: Self-pay | Admitting: Allergy & Immunology

## 2018-07-02 DIAGNOSIS — M2011 Hallux valgus (acquired), right foot: Secondary | ICD-10-CM | POA: Diagnosis not present

## 2018-07-02 DIAGNOSIS — L84 Corns and callosities: Secondary | ICD-10-CM | POA: Diagnosis not present

## 2018-07-02 DIAGNOSIS — L600 Ingrowing nail: Secondary | ICD-10-CM | POA: Diagnosis not present

## 2018-07-02 DIAGNOSIS — M2012 Hallux valgus (acquired), left foot: Secondary | ICD-10-CM | POA: Diagnosis not present

## 2018-07-02 DIAGNOSIS — E1149 Type 2 diabetes mellitus with other diabetic neurological complication: Secondary | ICD-10-CM | POA: Diagnosis not present

## 2018-07-02 DIAGNOSIS — L602 Onychogryphosis: Secondary | ICD-10-CM | POA: Diagnosis not present

## 2018-07-02 DIAGNOSIS — M2042 Other hammer toe(s) (acquired), left foot: Secondary | ICD-10-CM | POA: Diagnosis not present

## 2018-07-02 DIAGNOSIS — M2041 Other hammer toe(s) (acquired), right foot: Secondary | ICD-10-CM | POA: Diagnosis not present

## 2018-07-03 ENCOUNTER — Other Ambulatory Visit: Payer: Self-pay | Admitting: Allergy

## 2018-07-03 LAB — STREP PNEUMONIAE 23 SEROTYPES IGG
PNEUMO AB TYPE 14: 12.3 ug/mL (ref >1.3)
PNEUMO AB TYPE 19 (19F): 2.2 ug/mL (ref >1.3)
PNEUMO AB TYPE 1: 3 ug/mL (ref >1.3)
PNEUMO AB TYPE 23 (23F): 2 ug/mL (ref >1.3)
PNEUMO AB TYPE 2: 4.6 ug/mL (ref >1.3)
PNEUMO AB TYPE 3: 0.5 ug/mL — AB (ref >1.3)
PNEUMO AB TYPE 51 (7F): 4.3 ug/mL (ref >1.3)
Pneumo Ab Type 12 (12F)*: 0.3 ug/mL — ABNORMAL LOW (ref >1.3)
Pneumo Ab Type 17 (17F)*: 0.6 ug/mL — ABNORMAL LOW (ref >1.3)
Pneumo Ab Type 20*: 5.5 ug/mL (ref >1.3)
Pneumo Ab Type 22 (22F)*: 0.2 ug/mL — ABNORMAL LOW (ref >1.3)
Pneumo Ab Type 26 (6B)*: 0.4 ug/mL — ABNORMAL LOW (ref >1.3)
Pneumo Ab Type 34 (10A)*: <0.1 ug/mL — ABNORMAL LOW (ref >1.3)
Pneumo Ab Type 4*: 1.7 ug/mL (ref >1.3)
Pneumo Ab Type 43 (11A)*: 0.5 ug/mL — ABNORMAL LOW (ref >1.3)
Pneumo Ab Type 5*: 2.7 ug/mL (ref >1.3)
Pneumo Ab Type 54 (15B)*: 0.2 ug/mL — ABNORMAL LOW (ref >1.3)
Pneumo Ab Type 57 (19A)*: 1 ug/mL — ABNORMAL LOW (ref >1.3)
Pneumo Ab Type 68 (9V)*: 0.8 ug/mL — ABNORMAL LOW (ref >1.3)
Pneumo Ab Type 70 (33F)*: 1.1 ug/mL — ABNORMAL LOW (ref >1.3)
Pneumo Ab Type 8*: 2.5 ug/mL (ref >1.3)
Pneumo Ab Type 9 (9N)*: 1.7 ug/mL (ref >1.3)

## 2018-07-03 LAB — CBC WITH DIFFERENTIAL/PLATELET
BASOS: 1 %
Basophils Absolute: 0 10*3/uL (ref 0.0–0.2)
EOS (ABSOLUTE): 0.3 10*3/uL (ref 0.0–0.4)
EOS: 5 %
HEMATOCRIT: 34.7 % (ref 34.0–46.6)
Hemoglobin: 11.7 g/dL (ref 11.1–15.9)
IMMATURE GRANULOCYTES: 0 %
Immature Grans (Abs): 0 10*3/uL (ref 0.0–0.1)
LYMPHS ABS: 2.4 10*3/uL (ref 0.7–3.1)
Lymphs: 35 %
MCH: 28.6 pg (ref 26.6–33.0)
MCHC: 33.7 g/dL (ref 31.5–35.7)
MCV: 85 fL (ref 79–97)
MONOS ABS: 0.5 10*3/uL (ref 0.1–0.9)
Monocytes: 7 %
Neutrophils Absolute: 3.5 10*3/uL (ref 1.4–7.0)
Neutrophils: 52 %
Platelets: 165 10*3/uL (ref 150–450)
RBC: 4.09 x10E6/uL (ref 3.77–5.28)
RDW: 13.5 % (ref 12.3–15.4)
WBC: 6.7 10*3/uL (ref 3.4–10.8)

## 2018-07-03 LAB — IGG, IGA, IGM
IGA/IMMUNOGLOBULIN A, SERUM: 288 mg/dL (ref 87–352)
IGG (IMMUNOGLOBIN G), SERUM: 1135 mg/dL (ref 700–1600)
IgM (Immunoglobulin M), Srm: 32 mg/dL (ref 26–217)

## 2018-07-03 LAB — COMPLEMENT, TOTAL: Compl, Total (CH50): >60 U/mL (ref 42–999999)

## 2018-07-03 LAB — DIPHTHERIA / TETANUS ANTIBODY PANEL: Diphtheria Ab: <0.1 IU/mL — ABNORMAL LOW (ref <0.10)

## 2018-07-08 ENCOUNTER — Telehealth: Payer: Self-pay | Admitting: Allergy & Immunology

## 2018-07-08 NOTE — Telephone Encounter (Signed)
Patient called and said Dr. Ernst Bowler wanted her to come in for blood work. Not sure if she needs an appointment or needs orders for this?

## 2018-07-08 NOTE — Telephone Encounter (Signed)
Called and spoke with patient and informed her to go get a tdap injection and after she gets the injection to call us so we can inform her on when to come back to get her labs drawn for her titers.

## 2018-07-10 NOTE — Telephone Encounter (Signed)
Patient has called back asking if Dr Ernst Bowler can give her this TDAP injection - or is her PCP the only physician that can give it to her?? There may be a scheduling issue with her PCP  Please call patient and advise

## 2018-07-11 NOTE — Telephone Encounter (Signed)
Patient called this morning and I informed her she would need to go to her PCP to get the Tdap vaccine. Patient stated understanding and will call her PCP to set up OV to get Tdap.

## 2018-07-26 ENCOUNTER — Other Ambulatory Visit: Payer: Self-pay | Admitting: Allergy

## 2018-08-06 ENCOUNTER — Other Ambulatory Visit: Payer: Self-pay | Admitting: Family Medicine

## 2018-08-06 DIAGNOSIS — N182 Chronic kidney disease, stage 2 (mild): Principal | ICD-10-CM

## 2018-08-06 DIAGNOSIS — E1122 Type 2 diabetes mellitus with diabetic chronic kidney disease: Secondary | ICD-10-CM

## 2018-08-09 ENCOUNTER — Ambulatory Visit (INDEPENDENT_AMBULATORY_CARE_PROVIDER_SITE_OTHER): Payer: Medicare Other | Admitting: Gastroenterology

## 2018-08-09 ENCOUNTER — Encounter

## 2018-08-09 ENCOUNTER — Encounter: Payer: Self-pay | Admitting: Gastroenterology

## 2018-08-09 ENCOUNTER — Encounter: Payer: Self-pay | Admitting: *Deleted

## 2018-08-09 VITALS — BP 151/90 | HR 97 | Temp 97.0°F | Ht 61.0 in | Wt 194.0 lb

## 2018-08-09 DIAGNOSIS — Z1211 Encounter for screening for malignant neoplasm of colon: Secondary | ICD-10-CM

## 2018-08-09 DIAGNOSIS — R1310 Dysphagia, unspecified: Secondary | ICD-10-CM | POA: Diagnosis not present

## 2018-08-09 DIAGNOSIS — K219 Gastro-esophageal reflux disease without esophagitis: Secondary | ICD-10-CM

## 2018-08-09 DIAGNOSIS — R1319 Other dysphagia: Secondary | ICD-10-CM

## 2018-08-09 MED ORDER — DEXLANSOPRAZOLE 60 MG PO CPDR: 60.0000 mg | DELAYED_RELEASE_CAPSULE | Freq: Every day | ORAL | 3 refills | Status: DC

## 2018-08-09 MED ORDER — PEG 3350-KCL-NA BICARB-NACL 420 G PO SOLR: 4000.0000 mL | Freq: Once | ORAL | 0 refills | Status: AC

## 2018-08-09 NOTE — Patient Instructions (Signed)
Stop Dexilant 30mg  and start Dexilant 60mg  30 minutes before breakfast daily.  We have set you up for a colonoscopy and upper endoscopy. See separate instructions.   Gastroesophageal Reflux Disease, Adult Normally, food travels down the esophagus and stays in the stomach to be digested. However, when a person has gastroesophageal reflux disease (GERD), food and stomach acid move back up into the esophagus. When this happens, the esophagus becomes sore and inflamed. Over time, GERD can create small holes (ulcers) in the lining of the esophagus. What are the causes? This condition is caused by a problem with the muscle between the esophagus and the stomach (lower esophageal sphincter, or LES). Normally, the LES muscle closes after food passes through the esophagus to the stomach. When the LES is weakened or abnormal, it does not close properly, and that allows food and stomach acid to go back up into the esophagus. The LES can be weakened by certain dietary substances, medicines, and medical conditions, including:  Tobacco use.  Pregnancy.  Having a hiatal hernia.  Heavy alcohol use.  Certain foods and beverages, such as coffee, chocolate, onions, and peppermint.  What increases the risk? This condition is more likely to develop in:  People who have an increased body weight.  People who have connective tissue disorders.  People who use NSAID medicines.  What are the signs or symptoms? Symptoms of this condition include:  Heartburn.  Difficult or painful swallowing.  The feeling of having a lump in the throat.  Abitter taste in the mouth.  Bad breath.  Having a large amount of saliva.  Having an upset or bloated stomach.  Belching.  Chest pain.  Shortness of breath or wheezing.  Ongoing (chronic) cough or a night-time cough.  Wearing away of tooth enamel.  Weight loss.  Different conditions can cause chest pain. Make sure to see your health care provider if you  experience chest pain. How is this diagnosed? Your health care provider will take a medical history and perform a physical exam. To determine if you have mild or severe GERD, your health care provider may also monitor how you respond to treatment. You may also have other tests, including:  An endoscopy toexamine your stomach and esophagus with a small camera.  A test thatmeasures the acidity level in your esophagus.  A test thatmeasures how much pressure is on your esophagus.  A barium swallow or modified barium swallow to show the shape, size, and functioning of your esophagus.  How is this treated? The goal of treatment is to help relieve your symptoms and to prevent complications. Treatment for this condition may vary depending on how severe your symptoms are. Your health care provider may recommend:  Changes to your diet.  Medicine.  Surgery.  Follow these instructions at home: Diet  Follow a diet as recommended by your health care provider. This may involve avoiding foods and drinks such as: ? Coffee and tea (with or without caffeine). ? Drinks that containalcohol. ? Energy drinks and sports drinks. ? Carbonated drinks or sodas. ? Chocolate and cocoa. ? Peppermint and mint flavorings. ? Garlic and onions. ? Horseradish. ? Spicy and acidic foods, including peppers, chili powder, curry powder, vinegar, hot sauces, and barbecue sauce. ? Citrus fruit juices and citrus fruits, such as oranges, lemons, and limes. ? Tomato-based foods, such as red sauce, chili, salsa, and pizza with red sauce. ? Fried and fatty foods, such as donuts, french fries, potato chips, and high-fat dressings. ? High-fat meats,  such as hot dogs and fatty cuts of red and white meats, such as rib eye steak, sausage, ham, and bacon. ? High-fat dairy items, such as whole milk, butter, and cream cheese.  Eat small, frequent meals instead of large meals.  Avoid drinking large amounts of liquid with your  meals.  Avoid eating meals during the 2-3 hours before bedtime.  Avoid lying down right after you eat.  Do not exercise right after you eat. General instructions  Pay attention to any changes in your symptoms.  Take over-the-counter and prescription medicines only as told by your health care provider. Do not take aspirin, ibuprofen, or other NSAIDs unless your health care provider told you to do so.  Do not use any tobacco products, including cigarettes, chewing tobacco, and e-cigarettes. If you need help quitting, ask your health care provider.  Wear loose-fitting clothing. Do not wear anything tight around your waist that causes pressure on your abdomen.  Raise (elevate) the head of your bed 6 inches (15cm).  Try to reduce your stress, such as with yoga or meditation. If you need help reducing stress, ask your health care provider.  If you are overweight, reduce your weight to an amount that is healthy for you. Ask your health care provider for guidance about a safe weight loss goal.  Keep all follow-up visits as told by your health care provider. This is important. Contact a health care provider if:  You have new symptoms.  You have unexplained weight loss.  You have difficulty swallowing, or it hurts to swallow.  You have wheezing or a persistent cough.  Your symptoms do not improve with treatment.  You have a hoarse voice. Get help right away if:  You have pain in your arms, neck, jaw, teeth, or back.  You feel sweaty, dizzy, or light-headed.  You have chest pain or shortness of breath.  You vomit and your vomit looks like blood or coffee grounds.  You faint.  Your stool is bloody or black.  You cannot swallow, drink, or eat. This information is not intended to replace advice given to you by your health care provider. Make sure you discuss any questions you have with your health care provider. Document Released: 07/12/2005 Document Revised: 03/01/2016  Document Reviewed: 01/27/2015 Elsevier Interactive Patient Education  2018 [place] for Gastroesophageal Reflux Disease, Adult When you have gastroesophageal reflux disease (GERD), the foods you eat and your eating habits are very important. Choosing the right foods can help ease the discomfort of GERD. Consider working with a diet and nutrition specialist (dietitian) to help you make healthy food choices. What general guidelines should I follow? Eating plan  Choose healthy foods low in fat, such as fruits, vegetables, whole grains, low-fat dairy products, and lean meat, fish, and poultry.  Eat frequent, small meals instead of three large meals each day. Eat your meals slowly, in a relaxed setting. Avoid bending over or lying down until 2-3 hours after eating.  Limit high-fat foods such as fatty meats or fried foods.  Limit your intake of oils, butter, and shortening to less than 8 teaspoons each day.  Avoid the following: ? Foods that cause symptoms. These may be different for different people. Keep a food diary to keep track of foods that cause symptoms. ? Alcohol. ? Drinking large amounts of liquid with meals. ? Eating meals during the 2-3 hours before bed.  Cook foods using methods other than frying. This may include baking,  grilling, or broiling. Lifestyle   Maintain a healthy weight. Ask your health care provider what weight is healthy for you. If you need to lose weight, work with your health care provider to do so safely.  Exercise for at least 30 minutes on 5 or more days each week, or as told by your health care provider.  Avoid wearing clothes that fit tightly around your waist and chest.  Do not use any products that contain nicotine or tobacco, such as cigarettes and e-cigarettes. If you need help quitting, ask your health care provider.  Sleep with the head of your bed raised. Use a wedge under the mattress or blocks under the bed frame to raise  the head of the bed. What foods are not recommended? The items listed may not be a complete list. Talk with your dietitian about what dietary choices are best for you. Grains Pastries or quick breads with added fat. [place] toast. Vegetables Deep fried vegetables. [place] fries. Any vegetables prepared with added fat. Any vegetables that cause symptoms. For some people this may include tomatoes and tomato products, chili peppers, onions and garlic, and horseradish. Fruits Any fruits prepared with added fat. Any fruits that cause symptoms. For some people this may include citrus fruits, such as oranges, grapefruit, pineapple, and lemons. Meats and other protein foods High-fat meats, such as fatty beef or pork, hot dogs, ribs, ham, sausage, salami and bacon. Fried meat or protein, including fried fish and fried chicken. Nuts and nut butters. Dairy Whole milk and chocolate milk. Sour cream. Cream. Ice cream. Cream cheese. Milk shakes. Beverages Coffee and tea, with or without caffeine. Carbonated beverages. Sodas. Energy drinks. Fruit juice made with acidic fruits (such as orange or grapefruit). Tomato juice. Alcoholic drinks. Fats and oils Butter. Margarine. Shortening. Ghee. Sweets and desserts Chocolate and cocoa. Donuts. Seasoning and other foods Pepper. Peppermint and spearmint. Any condiments, herbs, or seasonings that cause symptoms. For some people, this may include curry, hot sauce, or vinegar-based salad dressings. Summary  When you have gastroesophageal reflux disease (GERD), food and lifestyle choices are very important to help ease the discomfort of GERD.  Eat frequent, small meals instead of three large meals each day. Eat your meals slowly, in a relaxed setting. Avoid bending over or lying down until 2-3 hours after eating.  Limit high-fat foods such as fatty meat or fried foods. This information is not intended to replace advice given to you by your health care provider. Make  sure you discuss any questions you have with your health care provider. Document Released: 10/02/2005 Document Revised: 10/03/2016 Document Reviewed: 10/03/2016 Elsevier Interactive Patient Education  [employer].

## 2018-08-09 NOTE — Progress Notes (Addendum)
REVIEWED-EGD/?dil/TCS w/ MAC   Primary Care Physician: Barbara Macadam, MD  Primary Gastroenterologist:  Barbara Drain, MD   Chief Complaint  Patient presents with  . Gastroesophageal Reflux  . Dysphagia    food    HPI: Barbara House is a 62 y.o. female here at the request of Dr. Mannie House for further evaluation of chronic GERD, solid food dysphagia. Patient's initial referral came 08/2017. Patient had multiple rescheduled appointments. She is here now for evaluation but I see she saw GI with [facility] in [place] couple of weeks ago and is scheduled for a colonoscopy. She states Barbara House is too far to go as she depends on public transportation. She wants her work up here locally.   She has chronic GERD. States she has lost her sense of taste. Everything seems bitter, salty, sour. . Sometimes feels like has to wash food down. Burning in the esophagus and into chest. Regurgitation. Bad symptoms for several months. Has trouble swallowing dry foods, meat, cornbread, feels like scratchy on the left side of neck. Mouth extremely dry. Tongue stays sore. Dentist doesn't find any problems. Sleeps on back or side. Wakes up in the morning with white stuff caked on her face. Some epigastric pain. BM twice per day. No melena, brbpr.   Last tcs over 10 years ago. Does not take Trazodone regularly. Has not had refills in over two months.     Current Outpatient Medications  Medication Sig Dispense Refill  . aspirin EC 81 MG tablet Take 81 mg by mouth daily.    [name] atorvastatin (LIPITOR) 40 MG tablet Take 0.5 tablets (20 mg total) by mouth daily.    [name] augmented betamethasone dipropionate (DIPROLENE-AF) 0.05 % cream Apply 1 application topically 2 (two) times daily as needed (for itching).    . Azelastine HCl 0.15 % SOLN Place 1 spray into the nose at bedtime as needed. 30 mL 5  . cholecalciferol (VITAMIN D) 1000 units tablet Take 1,000 Units by mouth daily.    . cycloSPORINE (RESTASIS) 0.05 % ophthalmic  emulsion Place 1 drop into both eyes 2 (two) times daily as needed (for eye dryness.).    [name] Dexlansoprazole (DEXILANT) 30 MG capsule Take 1 capsule (30 mg total) by mouth daily. 90 capsule 0  . fluticasone (FLONASE) 50 MCG/ACT nasal spray Place 1-2 sprays into both nostrils daily as needed (for sinus issues.). 16 g 5  . gabapentin (NEURONTIN) 100 MG capsule TAKE ONE CAPSULE BY MOUTH TWICE DAILY. (Patient taking differently: as needed. ) 180 capsule 0  . glipiZIDE (GLUCOTROL XL) 10 MG 24 hr tablet TAKE ONE TABLET BY MOUTH DAILY. TAKE WITH BREAKFAST. 90 tablet 0  . hydrocortisone 2.5 % ointment Apply topically 2 (two) times daily. 30 g 0  . levocetirizine (XYZAL) 5 MG tablet Take 1 tablet (5 mg total) by mouth every evening. 30 tablet 3  . linagliptin (TRADJENTA) 5 MG TABS tablet Take 1 tablet (5 mg total) by mouth daily. 90 tablet 0  . lisinopril (PRINIVIL,ZESTRIL) 40 MG tablet TAKE ONE TABLET BY MOUTH DAILY. 90 tablet 0  . metFORMIN (GLUCOPHAGE) 1000 MG tablet TAKE ONE TABLET BY MOUTH TWICE DAILY 180 tablet 0  . montelukast (SINGULAIR) 10 MG tablet TAKE 1 TABLET (10 MG TOTAL) BY MOUTH AT BEDTIME AS NEEDED (FOR ALLERGIES.). 90 tablet 1  . montelukast (SINGULAIR) 10 MG tablet TAKE 1 TABLET (10 MG TOTAL) BY MOUTH AT BEDTIME AS NEEDED (FOR ALLERGIES.). 30 tablet 5  . nabumetone (RELAFEN) 750 MG tablet TAKE  ONE TABLET BY MOUTH TWICE DAILY AS DIRECTED.  5  . Olopatadine HCl (PATANASE) 0.6 % SOLN Use 1-2 sprays each nostril daily 30.5 g 3  . PARoxetine (PAXIL) 40 MG tablet TAKE ONE TABLET BY MOUTH EVERY MORNING. 90 tablet 0  . ranitidine (ZANTAC) 150 MG tablet Take 150 mg by mouth daily.   0  . traZODone (DESYREL) 50 MG tablet Take 50-100 mg by mouth at bedtime as needed for sleep.    [name] triamterene-hydrochlorothiazide (MAXZIDE-25) 37.5-25 MG tablet Take 1 tablet by mouth daily. 90 tablet 3  . UNABLE TO FIND Diabetic shoe 1 each 0  . verapamil (CALAN-SR) 240 MG CR tablet TAKE ONE TABLET BY MOUTH EVERY  EVENING. 90 tablet 0   No current facility-administered medications for this visit.     Allergies as of 08/09/2018 - Review Complete 08/09/2018  Allergen Reaction Noted  . Prednisone Other (See Comments) 09/15/2016   Past Medical History:  Diagnosis Date  . Allergic rhinitis   . Anxiety disorder   . GERD (gastroesophageal reflux disease)   . History of migraine   . Hypertension   . Type 2 diabetes mellitus ([place])    Not sure when she was diagnosed, greater than five years.    Past Surgical History:  Procedure Laterality Date  . ABDOMINAL HYSTERECTOMY     has ovaries.   [name] CARDIAC CATHETERIZATION N/A 08/03/2016   Procedure: Left Heart Cath and Coronary Angiography;  Surgeon: Peter M Martinique, MD;  Location: [facility];  Service: Cardiovascular;  Laterality: N/A;  . Miinor skin surgery     Family History  Problem Relation Age of Onset  . Hypertension Mother   . Diabetes Mother   . Hypertension Son   . Diabetes Son   . Cancer Father   . Cirrhosis Brother   . Alcohol abuse Brother   . Colon cancer Neg Hx    Social History   Socioeconomic History  . Marital status: Single    Spouse name: Not on file  . Number of children: Not on file  . Years of education: Not on file  . Highest education level: Not on file  Occupational History  . Not on file  Social Needs  . Financial resource strain: Not on file  . Food insecurity:    Worry: Not on file    Inability: Not on file  . Transportation needs:    Medical: Not on file    Non-medical: Not on file  Tobacco Use  . Smoking status: Never Smoker  . Smokeless tobacco: Never Used  Substance and Sexual Activity  . Alcohol use: No    Alcohol/week: 0.0 standard drinks  . Drug use: No  . Sexual activity: Not on file  Lifestyle  . Physical activity:    Days per week: 7 days    Minutes per session: 20 min  . Stress: Only a little  Relationships  . Social connections:    Talks on phone: Not on file    Gets together:  Not on file    Attends religious service: Not on file    Active member of club or organization: Not on file    Attends meetings of clubs or organizations: Not on file    Relationship status: Not on file  Other Topics Concern  . Not on file  Social History Narrative   Reports is on disability. Has one son. Lives in [place]. Enjoys time with family. Enjoys Firefighter. Attends church.  ROS:  General: Negative for anorexia, weight loss, fever, chills, fatigue, weakness. ENT: Negative for hoarseness, difficulty swallowing , nasal congestion. CV: Negative for chest pain, angina, palpitations, dyspnea on exertion, peripheral edema.  Respiratory: Negative for dyspnea at rest, dyspnea on exertion, cough, sputum, wheezing.  GI: See history of present illness. GU:  Negative for dysuria, hematuria, urinary incontinence, urinary frequency, nocturnal urination.  Endo: Negative for unusual weight change.    Physical Examination:   BP (!) 151/90   Pulse 97   Temp (!) 97 F (36.1 C) (Oral)   Ht 5' 1"  (1.549 m)   Wt 194 lb (88 kg)   BMI 36.66 kg/m   General: Well-nourished, well-developed in no acute distress.  Eyes: No icterus. Mouth: Oropharyngeal mucosa moist and pink , no lesions erythema or exudate. Lungs: Clear to auscultation bilaterally.  Heart: Regular rate and rhythm, no murmurs rubs or gallops.  Abdomen: Bowel sounds are normal, nontender, nondistended, no hepatosplenomegaly or masses, no abdominal bruits or hernia , no rebound or guarding.   Extremities: No lower extremity edema. No clubbing or deformities. Neuro: Alert and oriented x 4   Skin: Warm and dry, no jaundice.   Psych: Alert and cooperative, normal mood and affect.  Labs:  Lab Results  Component Value Date   CREATININE 1.11 (H) 05/31/2018   BUN 23 (H) 05/31/2018   NA 138 05/31/2018   K 3.9 05/31/2018   CL 103 05/31/2018   CO2 26 05/31/2018   Lab Results  Component Value Date   ALT 17 03/08/2018   AST 19  03/08/2018   BILITOT 0.4 03/08/2018   Lab Results  Component Value Date   WBC 6.7 06/25/2018   HGB 11.7 06/25/2018   HCT 34.7 06/25/2018   MCV 85 06/25/2018   PLT 165 06/25/2018    Imaging Studies: No results found.

## 2018-08-12 ENCOUNTER — Ambulatory Visit: Payer: Medicare Other | Admitting: Nurse Practitioner

## 2018-08-12 NOTE — Assessment & Plan Note (Signed)
Screening colonoscopy in near future.  I have discussed the risks, alternatives, benefits with regards to but not limited to the risk of reaction to medication, bleeding, infection, perforation and the patient is agreeable to proceed. Written consent to be obtained.  

## 2018-08-12 NOTE — Progress Notes (Signed)
cc'ed to pcp °

## 2018-08-12 NOTE — Assessment & Plan Note (Signed)
Chronic GERD and solid food dysphagia. Symptoms not controlled on dexilant 30mg  daily. Complains of taste issues, dry mouth, sore tongue which may or may not be reflux related. Will increase to 60mg  daily. Plan for EGD/ED in near future.  I have discussed the risks, alternatives, benefits with regards to but not limited to the risk of reaction to medication, bleeding, infection, perforation and the patient is agreeable to proceed. Written consent to be obtained.

## 2018-08-15 ENCOUNTER — Telehealth: Payer: Self-pay | Admitting: Gastroenterology

## 2018-08-15 ENCOUNTER — Other Ambulatory Visit: Payer: Self-pay

## 2018-08-15 DIAGNOSIS — R1310 Dysphagia, unspecified: Secondary | ICD-10-CM

## 2018-08-15 DIAGNOSIS — R1319 Other dysphagia: Secondary | ICD-10-CM

## 2018-08-15 DIAGNOSIS — K219 Gastro-esophageal reflux disease without esophagitis: Secondary | ICD-10-CM

## 2018-08-15 DIAGNOSIS — Z1211 Encounter for screening for malignant neoplasm of colon: Secondary | ICD-10-CM

## 2018-08-15 NOTE — Telephone Encounter (Signed)
Called and informed pt of pre-op appt 10/30/18 at 10:00am. Letter mailed with new procedure instructions.

## 2018-08-15 NOTE — Telephone Encounter (Signed)
Called and informed pt procedure will need to be rescheduled with deeper sedation. TCS/EGD/-/+DIL w/Propofol scheduled for 11/05/18 at 8:45am. Will mail new instructions after pre-op appt is scheduled. LMOVM for endo scheduler to cancel previous orders. New orders entered.

## 2018-08-15 NOTE — Telephone Encounter (Signed)
Dr. Oneida Alar recommends changing patient to EGD with possible esophageal dilation/colonoscopy with MAC.  Previously scheduled for conscious sedation but needs to be changed to MAC.

## 2018-09-14 ENCOUNTER — Other Ambulatory Visit: Payer: Self-pay | Admitting: Family Medicine

## 2018-09-14 DIAGNOSIS — N182 Chronic kidney disease, stage 2 (mild): Secondary | ICD-10-CM

## 2018-09-14 DIAGNOSIS — E1122 Type 2 diabetes mellitus with diabetic chronic kidney disease: Secondary | ICD-10-CM

## 2018-09-14 DIAGNOSIS — E1142 Type 2 diabetes mellitus with diabetic polyneuropathy: Secondary | ICD-10-CM

## 2018-09-17 ENCOUNTER — Other Ambulatory Visit: Payer: Self-pay | Admitting: Family Medicine

## 2018-09-17 DIAGNOSIS — N182 Chronic kidney disease, stage 2 (mild): Principal | ICD-10-CM

## 2018-09-17 DIAGNOSIS — E1122 Type 2 diabetes mellitus with diabetic chronic kidney disease: Secondary | ICD-10-CM

## 2018-09-22 DIAGNOSIS — R112 Nausea with vomiting, unspecified: Secondary | ICD-10-CM | POA: Diagnosis not present

## 2018-09-22 DIAGNOSIS — Z7984 Long term (current) use of oral hypoglycemic drugs: Secondary | ICD-10-CM | POA: Diagnosis not present

## 2018-09-22 DIAGNOSIS — N281 Cyst of kidney, acquired: Secondary | ICD-10-CM | POA: Diagnosis not present

## 2018-09-22 DIAGNOSIS — E119 Type 2 diabetes mellitus without complications: Secondary | ICD-10-CM | POA: Diagnosis not present

## 2018-09-22 DIAGNOSIS — K5730 Diverticulosis of large intestine without perforation or abscess without bleeding: Secondary | ICD-10-CM | POA: Diagnosis not present

## 2018-09-22 DIAGNOSIS — Z7982 Long term (current) use of aspirin: Secondary | ICD-10-CM | POA: Diagnosis not present

## 2018-09-22 DIAGNOSIS — Z888 Allergy status to other drugs, medicaments and biological substances status: Secondary | ICD-10-CM | POA: Diagnosis not present

## 2018-09-22 DIAGNOSIS — I10 Essential (primary) hypertension: Secondary | ICD-10-CM | POA: Diagnosis not present

## 2018-09-22 DIAGNOSIS — Z90710 Acquired absence of both cervix and uterus: Secondary | ICD-10-CM | POA: Diagnosis not present

## 2018-09-22 DIAGNOSIS — R1013 Epigastric pain: Secondary | ICD-10-CM | POA: Diagnosis not present

## 2018-09-22 DIAGNOSIS — Z791 Long term (current) use of non-steroidal anti-inflammatories (NSAID): Secondary | ICD-10-CM | POA: Diagnosis not present

## 2018-09-22 DIAGNOSIS — Z79899 Other long term (current) drug therapy: Secondary | ICD-10-CM | POA: Diagnosis not present

## 2018-09-30 ENCOUNTER — Other Ambulatory Visit: Payer: Self-pay | Admitting: Family Medicine

## 2018-09-30 DIAGNOSIS — I10 Essential (primary) hypertension: Secondary | ICD-10-CM

## 2018-10-03 DIAGNOSIS — E119 Type 2 diabetes mellitus without complications: Secondary | ICD-10-CM | POA: Diagnosis not present

## 2018-10-03 DIAGNOSIS — E1165 Type 2 diabetes mellitus with hyperglycemia: Secondary | ICD-10-CM | POA: Diagnosis not present

## 2018-10-03 DIAGNOSIS — J40 Bronchitis, not specified as acute or chronic: Secondary | ICD-10-CM | POA: Diagnosis not present

## 2018-10-03 DIAGNOSIS — J019 Acute sinusitis, unspecified: Secondary | ICD-10-CM | POA: Diagnosis not present

## 2018-10-03 DIAGNOSIS — Z0131 Encounter for examination of blood pressure with abnormal findings: Secondary | ICD-10-CM | POA: Diagnosis not present

## 2018-10-03 DIAGNOSIS — J029 Acute pharyngitis, unspecified: Secondary | ICD-10-CM | POA: Diagnosis not present

## 2018-10-03 DIAGNOSIS — I10 Essential (primary) hypertension: Secondary | ICD-10-CM | POA: Diagnosis not present

## 2018-10-03 DIAGNOSIS — R062 Wheezing: Secondary | ICD-10-CM | POA: Diagnosis not present

## 2018-10-08 ENCOUNTER — Ambulatory Visit: Payer: Medicare Other | Admitting: Family Medicine

## 2018-10-14 ENCOUNTER — Ambulatory Visit (HOSPITAL_COMMUNITY): Admit: 2018-10-14 | Payer: Medicare Other | Admitting: Gastroenterology

## 2018-10-14 ENCOUNTER — Encounter (HOSPITAL_COMMUNITY): Payer: Self-pay

## 2018-10-14 SURGERY — COLONOSCOPY
Anesthesia: Moderate Sedation

## 2018-10-23 NOTE — Patient Instructions (Signed)
Barbara House  10/23/2018     @PREFPERIOPPHARMACY @   Your procedure is scheduled on  11/05/2018.  Report to [facility] at  715   A.M.  Call this number if you have problems the morning of surgery:  [phone]   Remember:  Follow the diet and prep instructions given to you by Dr Nona Dell office.                       Take these medicines the morning of surgery with A SIP OF WATER  Dexilant, gabapentin, lisinopril, relafen ( if needed), paxil, verapamil.    Do not wear jewelry, make-up or nail polish.  Do not wear lotions, powders, or perfumes, or deodorant.  Do not shave 48 hours prior to surgery.  Men may shave face and neck.  Do not bring valuables to the hospital.  [facility] is not responsible for any belongings or valuables.  Contacts, dentures or bridgework may not be worn into surgery.  Leave your suitcase in the car.  After surgery it may be brought to your room.  For patients admitted to the hospital, discharge time will be determined by your treatment team.  Patients discharged the day of surgery will not be allowed to drive home.   Name and phone number of your driver:   family Special instructions:  DO NOT take any medications for diabetes the morning of your procedure.  Please read over the following fact sheets that you were given. Anesthesia Post-op Instructions and Care and Recovery After Surgery       Upper Endoscopy, Adult Upper endoscopy is a procedure to look inside the upper GI (gastrointestinal) tract. The upper GI tract is made up of:  The part of the body that moves food from your mouth to your stomach (esophagus).  The stomach.  The first part of your small intestine (duodenum). This procedure is also called esophagogastroduodenoscopy (EGD) or gastroscopy. In this procedure, your health care provider passes a thin, flexible tube (endoscope) through your mouth and down your esophagus into your stomach. A small  camera is attached to the end of the tube. Images from the camera appear on a monitor in the exam room. During this procedure, your health care provider may also remove a small piece of tissue to be sent to a lab and examined under a microscope (biopsy). Your health care provider may do an upper endoscopy to diagnose cancers of the upper GI tract. You may also have this procedure to find the cause of other conditions, such as:  Stomach pain.  Heartburn.  Pain or problems when swallowing.  Nausea and vomiting.  Stomach bleeding.  Stomach ulcers. Tell a health care provider about:  Any allergies you have.  All medicines you are taking, including vitamins, herbs, eye drops, creams, and over-the-counter medicines.  Any problems you or family members have had with anesthetic medicines.  Any blood disorders you have.  Any surgeries you have had.  Any medical conditions you have.  Whether you are pregnant or may be pregnant. What are the risks? Generally, this is a safe procedure. However, problems may occur, including:  Infection.  Bleeding.  Allergic reactions to medicines.  A tear or hole (perforation) in the esophagus, stomach, or duodenum. What happens before the procedure? Staying hydrated Follow instructions from your health care provider about hydration, which may include:  Up to  2 hours before the procedure - you may continue to drink clear liquids, such as water, clear fruit juice, black coffee, and plain tea.  Eating and drinking restrictions Follow instructions from your health care provider about eating and drinking, which may include:  8 hours before the procedure - stop eating heavy meals or foods, such as meat, fried foods, or fatty foods.  6 hours before the procedure - stop eating light meals or foods, such as toast or cereal.  6 hours before the procedure - stop drinking milk or drinks that contain milk.  2 hours before the procedure - stop drinking  clear liquids. Medicines Ask your health care provider about:  Changing or stopping your regular medicines. This is especially important if you are taking diabetes medicines or blood thinners.  Taking medicines such as aspirin and ibuprofen. These medicines can thin your blood. Do not take these medicines unless your health care provider tells you to take them.  Taking over-the-counter medicines, vitamins, herbs, and supplements. General instructions  Plan to have someone take you home from the hospital or clinic.  If you will be going home right after the procedure, plan to have someone with you for 24 hours.  Ask your health care provider what steps will be taken to help prevent infection. What happens during the procedure?   An IV will be inserted into one of your veins.  You may be given one or more of the following: ? A medicine to help you relax (sedative). ? A medicine to numb the throat (local anesthetic).  You will lie on your left side on an exam table.  Your health care provider will pass the endoscope through your mouth and down your esophagus.  Your health care provider will use the scope to check the inside of your esophagus, stomach, and duodenum. Biopsies may be taken.  The endoscope will be removed. The procedure may vary among health care providers and hospitals. What happens after the procedure?  Your blood pressure, heart rate, breathing rate, and blood oxygen level will be monitored until you leave the hospital or clinic.  Do not drive for 24 hours if you were given a sedative during your procedure.  When your throat is no longer numb, you may be given some fluids to drink.  It is up to you to get the results of your procedure. Ask your health care provider, or the department that is doing the procedure, when your results will be ready. Summary  Upper endoscopy is a procedure to look inside the upper GI tract.  During the procedure, an IV will be  inserted into one of your veins. You may be given a medicine to help you relax.  A medicine will be used to numb your throat.  The endoscope will be passed through your mouth and down your esophagus. This information is not intended to replace advice given to you by your health care provider. Make sure you discuss any questions you have with your health care provider. Document Released: 09/29/2000 Document Revised: 03/04/2018 Document Reviewed: 03/04/2018 Elsevier Interactive Patient Education  2019 [place] Endoscopy, Adult, Care After This sheet gives you information about how to care for yourself after your procedure. Your health care provider may also give you more specific instructions. If you have problems or questions, contact your health care provider. What can I expect after the procedure? After the procedure, it is common to have:  A sore throat.  Mild stomach pain or  discomfort.  Bloating.  Nausea. Follow these instructions at home:   Follow instructions from your health care provider about what to eat or drink after your procedure.  Return to your normal activities as told by your health care provider. Ask your health care provider what activities are safe for you.  Take over-the-counter and prescription medicines only as told by your health care provider.  Do not drive for 24 hours if you were given a sedative during your procedure.  Keep all follow-up visits as told by your health care provider. This is important. Contact a health care provider if you have:  A sore throat that lasts longer than one day.  Trouble swallowing. Get help right away if:  You vomit blood or your vomit looks like coffee grounds.  You have: ? A fever. ? Bloody, black, or tarry stools. ? A severe sore throat or you cannot swallow. ? Difficulty breathing. ? Severe pain in your chest or abdomen. Summary  After the procedure, it is common to have a sore throat, mild  stomach discomfort, bloating, and nausea.  Do not drive for 24 hours if you were given a sedative during the procedure.  Follow instructions from your health care provider about what to eat or drink after your procedure.  Return to your normal activities as told by your health care provider. This information is not intended to replace advice given to you by your health care provider. Make sure you discuss any questions you have with your health care provider. Document Released: 04/02/2012 Document Revised: 03/04/2018 Document Reviewed: 03/04/2018 Elsevier Interactive Patient Education  2019 [employer]  Esophageal Dilatation Esophageal dilatation, also called esophageal dilation, is a procedure to widen or open (dilate) a blocked or narrowed part of the esophagus. The esophagus is the part of the body that moves food and liquid from the mouth to the stomach. You may need this procedure if:  You have a buildup of scar tissue in your esophagus that makes it difficult, painful, or impossible to swallow. This can be caused by gastroesophageal reflux disease (GERD).  You have cancer of the esophagus.  There is a problem with how food moves through your esophagus. In some cases, you may need this procedure repeated at a later time to dilate the esophagus gradually. Tell a health care provider about:  Any allergies you have.  All medicines you are taking, including vitamins, herbs, eye drops, creams, and over-the-counter medicines.  Any problems you or family members have had with anesthetic medicines.  Any blood disorders you have.  Any surgeries you have had.  Any medical conditions you have.  Any antibiotic medicines you are required to take before dental procedures.  Whether you are pregnant or may be pregnant. What are the risks? Generally, this is a safe procedure. However, problems may occur, including:  Bleeding due to a tear in the lining of the esophagus.  A hole  (perforation) in the esophagus. What happens before the procedure?  Follow instructions from your health care provider about eating or drinking restrictions.  Ask your health care provider about changing or stopping your regular medicines. This is especially important if you are taking diabetes medicines or blood thinners.  Plan to have someone take you home from the hospital or clinic.  Plan to have a responsible adult care for you for at least 24 hours after you leave the hospital or clinic. This is important. What happens during the procedure?  You may be given a medicine  to help you relax (sedative).  A numbing medicine may be sprayed into the back of your throat, or you may gargle the medicine.  Your health care provider may perform the dilatation using various surgical instruments, such as: ? Simple dilators. This instrument is carefully placed in the esophagus to stretch it. ? Guided wire bougies. This involves using an endoscope to insert a wire into the esophagus. A dilator is passed over this wire to enlarge the esophagus. Then the wire is removed. ? Balloon dilators. An endoscope with a small balloon at the end is inserted into the esophagus. The balloon is inflated to stretch the esophagus and open it up. The procedure may vary among health care providers and hospitals. What happens after the procedure?  Your blood pressure, heart rate, breathing rate, and blood oxygen level will be monitored until the medicines you were given have worn off.  Your throat may feel slightly sore and numb. This will improve slowly over time.  You will not be allowed to eat or drink until your throat is no longer numb.  When you are able to drink, urinate, and sit on the edge of the bed without nausea or dizziness, you may be able to return home. Follow these instructions at home:  Take over-the-counter and prescription medicines only as told by your health care provider.  Do not drive for  24 hours if you were given a sedative during your procedure.  You should have a responsible adult with you for 24 hours after the procedure.  Follow instructions from your health care provider about any eating or drinking restrictions.  Do not use any products that contain nicotine or tobacco, such as cigarettes and e-cigarettes. If you need help quitting, ask your health care provider.  Keep all follow-up visits as told by your health care provider. This is important. Get help right away if you:  Have a fever.  Have chest pain.  Have pain that is not relieved by medication.  Have trouble breathing.  Have trouble swallowing.  Vomit blood. Summary  Esophageal dilatation, also called esophageal dilation, is a procedure to widen or open (dilate) a blocked or narrowed part of the esophagus.  Plan to have someone take you home from the hospital or clinic.  For this procedure, a numbing medicine may be sprayed into the back of your throat, or you may gargle the medicine.  Do not drive for 24 hours if you were given a sedative during your procedure. This information is not intended to replace advice given to you by your health care provider. Make sure you discuss any questions you have with your health care provider. Document Released: 11/23/2005 Document Revised: 08/07/2017 Document Reviewed: 08/07/2017 Elsevier Interactive Patient Education  2019 [employer].  Colonoscopy, Adult A colonoscopy is an exam to look at the large intestine. It is done to check for problems, such as:  Lumps (tumors).  Growths (polyps).  Swelling (inflammation).  Bleeding. What happens before the procedure? Eating and drinking Follow instructions from your doctor about eating and drinking. These instructions may include:  A few days before the procedure - follow a low-fiber diet. ? Avoid nuts. ? Avoid seeds. ? Avoid dried fruit. ? Avoid raw fruits. ? Avoid vegetables.  1-3 days before the  procedure - follow a clear liquid diet. Avoid liquids that have red or purple dye. Drink only clear liquids, such as: ? Clear broth or bouillon. ? Black coffee or tea. ? Clear juice. ? Clear soft  drinks or sports drinks. ? Gelatin dessert. ? Popsicles.  On the day of the procedure - do not eat or drink anything during the 2 hours before the procedure. Up to 2 hours before the procedure, you may continue to drink clear liquids, such as water or clear fruit juice.  Bowel prep If you were prescribed an oral bowel prep:  Take it as told by your doctor. Starting the day before your procedure, you will need to drink a lot of liquid. The liquid will cause you to poop (have bowel movements) until your poop is almost clear or light green.  To clean out your colon, you may also be given: ? Laxative medicines. ? Instructions about how to use an enema.  If your skin or butt gets irritated from diarrhea, you may: ? Wipe the area with wipes that have medicine in them, such as adult wet wipes with aloe and vitamin E. ? Put something on your skin that soothes the area, such as petroleum jelly.  If you throw up (vomit) while drinking the bowel prep, take a break for up to 60 minutes. Then begin the bowel prep again. If you keep throwing up and you cannot take the bowel prep without throwing up, call your doctor. General instructions  Ask your doctor about: ? Changing or stopping your normal medicines. This is important if you take iron pills, diabetes medicines, or blood thinners. ? Taking medicines such as aspirin and ibuprofen. These medicines can thin your blood. Do not take these medicines unless your doctor tells you to take them.  Plan to have someone take you home from the hospital or clinic. What happens during the procedure?   An IV tube may be put into one of your veins.  You will be given medicine to help you relax (sedative).  To reduce your risk of infection: ? Your doctors will  wash their hands. ? Your anal area will be washed with soap.  You will be asked to lie on your side with your knees bent.  Your doctor will get a long, thin, flexible tube ready. The tube will have a camera and a light on the end.  The tube will be put into your anus.  The tube will be gently put into your large intestine.  Air will be delivered into your large intestine to keep it open. You may feel some pressure or cramping.  The camera will be used to take photos.  A small tissue sample may be removed for testing (biopsy).  If small growths are found, your doctor may remove them and have them checked for cancer.  The tube that was put into your anus will be slowly removed. The procedure may vary among doctors and hospitals. What happens after the procedure?  Your doctor will check on you often until the medicines you were given have worn off.  Do not drive for 24 hours after the procedure.  You may have a small amount of blood in your poop.  You may pass gas.  You may have mild cramps or bloating in your belly (abdomen).  It is up to you to get the results of your procedure. Ask your doctor, or the department performing the procedure, when your results will be ready. Summary  A colonoscopy is an exam to look at the large intestine.  Follow instructions from your doctor about eating and drinking before the procedure.  If you were prescribed an oral bowel prep to clean out your colon,  take it as told by your doctor.  Your doctor will check on you often until the medicines you were given have worn off.  Plan to have someone take you home from the hospital or clinic. This information is not intended to replace advice given to you by your health care provider. Make sure you discuss any questions you have with your health care provider. Document Released: 11/04/2010 Document Revised: 08/01/2017 Document Reviewed: 12/14/2015 Elsevier Interactive Patient Education  2019  [employer]  Colonoscopy, Adult, Care After This sheet gives you information about how to care for yourself after your procedure. Your health care provider may also give you more specific instructions. If you have problems or questions, contact your health care provider. What can I expect after the procedure? After the procedure, it is common to have:  A small amount of blood in your stool for 24 hours after the procedure.  Some gas.  Mild abdominal cramping or bloating. Follow these instructions at home: General instructions  For the first 24 hours after the procedure: ? Do not drive or use machinery. ? Do not sign important documents. ? Do not drink alcohol. ? Do your regular daily activities at a slower pace than normal. ? Eat soft, easy-to-digest foods.  Take over-the-counter or prescription medicines only as told by your health care provider. Relieving cramping and bloating   Try walking around when you have cramps or feel bloated.  Apply heat to your abdomen as told by your health care provider. Use a heat source that your health care provider recommends, such as a moist heat pack or a heating pad. ? Place a towel between your skin and the heat source. ? Leave the heat on for 20-30 minutes. ? Remove the heat if your skin turns bright red. This is especially important if you are unable to feel pain, heat, or cold. You may have a greater risk of getting burned. Eating and drinking   Drink enough fluid to keep your urine pale yellow.  Resume your normal diet as instructed by your health care provider. Avoid heavy or fried foods that are hard to digest.  Avoid drinking alcohol for as long as instructed by your health care provider. Contact a health care provider if:  You have blood in your stool 2-3 days after the procedure. Get help right away if:  You have more than a small spotting of blood in your stool.  You pass large blood clots in your stool.  Your abdomen  is swollen.  You have nausea or vomiting.  You have a fever.  You have increasing abdominal pain that is not relieved with medicine. Summary  After the procedure, it is common to have a small amount of blood in your stool. You may also have mild abdominal cramping and bloating.  For the first 24 hours after the procedure, do not drive or use machinery, sign important documents, or drink alcohol.  Contact your health care provider if you have a lot of blood in your stool, nausea or vomiting, a fever, or increased abdominal pain. This information is not intended to replace advice given to you by your health care provider. Make sure you discuss any questions you have with your health care provider. Document Released: 05/16/2004 Document Revised: 07/25/2017 Document Reviewed: 12/14/2015 Elsevier Interactive Patient Education  2019 [place] Anesthesia is a term that refers to techniques, procedures, and medicines that help a person stay safe and comfortable during a medical procedure.  Monitored anesthesia care, or sedation, is one type of anesthesia. Your anesthesia specialist may recommend sedation if you will be having a procedure that does not require you to be unconscious, such as:  Cataract surgery.  A dental procedure.  A biopsy.  A colonoscopy. During the procedure, you may receive a medicine to help you relax (sedative). There are three levels of sedation:  Mild sedation. At this level, you may feel awake and relaxed. You will be able to follow directions.  Moderate sedation. At this level, you will be sleepy. You may not remember the procedure.  Deep sedation. At this level, you will be asleep. You will not remember the procedure. The more medicine you are given, the deeper your level of sedation will be. Depending on how you respond to the procedure, the anesthesia specialist may change your level of sedation or the type of anesthesia to fit  your needs. An anesthesia specialist will monitor you closely during the procedure. Let your health care provider know about:  Any allergies you have.  All medicines you are taking, including vitamins, herbs, eye drops, creams, and over-the-counter medicines.  Any use of steroids (by mouth or as a cream).  Any problems you or family members have had with sedatives and anesthetic medicines.  Any blood disorders you have.  Any surgeries you have had.  Any medical conditions you have, such as sleep apnea.  Whether you are pregnant or may be pregnant.  Any use of cigarettes, alcohol, or street drugs. What are the risks? Generally, this is a safe procedure. However, problems may occur, including:  Getting too much medicine (oversedation).  Nausea.  Allergic reaction to medicines.  Trouble breathing. If this happens, a breathing tube may be used to help with breathing. It will be removed when you are awake and breathing on your own.  Heart trouble.  Lung trouble. Before the procedure Staying hydrated Follow instructions from your health care provider about hydration, which may include:  Up to 2 hours before the procedure - you may continue to drink clear liquids, such as water, clear fruit juice, black coffee, and plain tea. Eating and drinking restrictions Follow instructions from your health care provider about eating and drinking, which may include:  8 hours before the procedure - stop eating heavy meals or foods such as meat, fried foods, or fatty foods.  6 hours before the procedure - stop eating light meals or foods, such as toast or cereal.  6 hours before the procedure - stop drinking milk or drinks that contain milk.  2 hours before the procedure - stop drinking clear liquids. Medicines Ask your health care provider about:  Changing or stopping your regular medicines. This is especially important if you are taking diabetes medicines or blood thinners.  Taking  medicines such as aspirin and ibuprofen. These medicines can thin your blood. Do not take these medicines before your procedure if your health care provider instructs you not to. Tests and exams  You will have a physical exam.  You may have blood tests done to show: ? How well your kidneys and liver are working. ? How well your blood can clot. General instructions  Plan to have someone take you home from the hospital or clinic.  If you will be going home right after the procedure, plan to have someone with you for 24 hours.  What happens during the procedure?  Your blood pressure, heart rate, breathing, level of pain and overall condition will be monitored.  An IV tube will be inserted into one of your veins.  Your anesthesia specialist will give you medicines as needed to keep you comfortable during the procedure. This may mean changing the level of sedation.  The procedure will be performed. After the procedure  Your blood pressure, heart rate, breathing rate, and blood oxygen level will be monitored until the medicines you were given have worn off.  Do not drive for 24 hours if you received a sedative.  You may: ? Feel sleepy, clumsy, or nauseous. ? Feel forgetful about what happened after the procedure. ? Have a sore throat if you had a breathing tube during the procedure. ? Vomit. This information is not intended to replace advice given to you by your health care provider. Make sure you discuss any questions you have with your health care provider. Document Released: 06/28/2005 Document Revised: 03/10/2016 Document Reviewed: 01/23/2016 Elsevier Interactive Patient Education  2019 [place], Care After These instructions provide you with information about caring for yourself after your procedure. Your health care provider may also give you more specific instructions. Your treatment has been planned according to current medical practices, but  problems sometimes occur. Call your health care provider if you have any problems or questions after your procedure. What can I expect after the procedure? After your procedure, you may:  Feel sleepy for several hours.  Feel clumsy and have poor balance for several hours.  Feel forgetful about what happened after the procedure.  Have poor judgment for several hours.  Feel nauseous or vomit.  Have a sore throat if you had a breathing tube during the procedure. Follow these instructions at home: For at least 24 hours after the procedure:      Have a responsible adult stay with you. It is important to have someone help care for you until you are awake and alert.  Rest as needed.  Do not: ? Participate in activities in which you could fall or become injured. ? Drive. ? Use heavy machinery. ? Drink alcohol. ? Take sleeping pills or medicines that cause drowsiness. ? Make important decisions or sign legal documents. ? Take care of children on your own. Eating and drinking  Follow the diet that is recommended by your health care provider.  If you vomit, drink water, juice, or soup when you can drink without vomiting.  Make sure you have little or no nausea before eating solid foods. General instructions  Take over-the-counter and prescription medicines only as told by your health care provider.  If you have sleep apnea, surgery and certain medicines can increase your risk for breathing problems. Follow instructions from your health care provider about wearing your sleep device: ? Anytime you are sleeping, including during daytime naps. ? While taking prescription pain medicines, sleeping medicines, or medicines that make you drowsy.  If you smoke, do not smoke without supervision.  Keep all follow-up visits as told by your health care provider. This is important. Contact a health care provider if:  You keep feeling nauseous or you keep vomiting.  You feel  light-headed.  You develop a rash.  You have a fever. Get help right away if:  You have trouble breathing. Summary  For several hours after your procedure, you may feel sleepy and have poor judgment.  Have a responsible adult stay with you for at least 24 hours or until you are awake and alert. This information is not intended to replace advice given to you by  your health care provider. Make sure you discuss any questions you have with your health care provider. Document Released: 01/23/2016 Document Revised: 05/18/2017 Document Reviewed: 01/23/2016 Elsevier Interactive Patient Education  2019 [employer].

## 2018-10-24 DIAGNOSIS — Z0131 Encounter for examination of blood pressure with abnormal findings: Secondary | ICD-10-CM | POA: Diagnosis not present

## 2018-10-24 DIAGNOSIS — J019 Acute sinusitis, unspecified: Secondary | ICD-10-CM | POA: Diagnosis not present

## 2018-10-24 DIAGNOSIS — E119 Type 2 diabetes mellitus without complications: Secondary | ICD-10-CM | POA: Diagnosis not present

## 2018-10-25 ENCOUNTER — Ambulatory Visit: Payer: Medicare Other | Admitting: Allergy & Immunology

## 2018-10-29 DIAGNOSIS — Z87442 Personal history of urinary calculi: Secondary | ICD-10-CM | POA: Diagnosis not present

## 2018-10-30 ENCOUNTER — Encounter (HOSPITAL_COMMUNITY)
Admission: RE | Admit: 2018-10-30 | Discharge: 2018-10-30 | Disposition: A | Discharge status: Home / Self Care | Payer: Medicare Other | Source: Ambulatory Visit | Attending: Gastroenterology | Admitting: Gastroenterology

## 2018-10-30 ENCOUNTER — Other Ambulatory Visit: Payer: Self-pay

## 2018-10-30 ENCOUNTER — Encounter (HOSPITAL_COMMUNITY): Payer: Self-pay

## 2018-10-30 DIAGNOSIS — Z01818 Encounter for other preprocedural examination: Secondary | ICD-10-CM | POA: Insufficient documentation

## 2018-10-30 HISTORY — DX: Unspecified osteoarthritis, unspecified site: M19.90

## 2018-10-30 HISTORY — DX: Headache, unspecified: R51.9

## 2018-10-30 HISTORY — DX: Headache: R51

## 2018-10-30 LAB — BASIC METABOLIC PANEL
Anion gap: 8 (ref 5–15)
BUN: 22 mg/dL — ABNORMAL HIGH (ref 6–20)
CO2: 25 mmol/L (ref 22–32)
Calcium: 9.6 mg/dL (ref 8.9–10.3)
Chloride: 105 mmol/L (ref 98–111)
Creatinine, Ser: 1 mg/dL (ref 0.44–1.00)
GFR calc Af Amer: >60 mL/min (ref >60)
GFR calc non Af Amer: >60 mL/min (ref >60)
GLUCOSE: 139 mg/dL — AB (ref 70–99)
Potassium: 4 mmol/L (ref 3.5–5.1)
Sodium: 138 mmol/L (ref 135–145)

## 2018-10-30 LAB — CBC WITH DIFFERENTIAL/PLATELET
Abs Immature Granulocytes: 0.02 10*3/uL (ref 0.00–0.07)
Basophils Absolute: 0.1 10*3/uL (ref 0.0–0.1)
Basophils Relative: 1 %
EOS ABS: 0.3 10*3/uL (ref 0.0–0.5)
Eosinophils Relative: 5 %
HCT: 36.3 % (ref 36.0–46.0)
Hemoglobin: 11.3 g/dL — ABNORMAL LOW (ref 12.0–15.0)
Immature Granulocytes: 0 %
Lymphocytes Relative: 27 %
Lymphs Abs: 1.5 10*3/uL (ref 0.7–4.0)
MCH: 28.5 pg (ref 26.0–34.0)
MCHC: 31.1 g/dL (ref 30.0–36.0)
MCV: 91.7 fL (ref 80.0–100.0)
Monocytes Absolute: 0.5 10*3/uL (ref 0.1–1.0)
Monocytes Relative: 8 %
NRBC: 0 % (ref 0.0–0.2)
Neutro Abs: 3.3 10*3/uL (ref 1.7–7.7)
Neutrophils Relative %: 59 %
Platelets: 93 10*3/uL — ABNORMAL LOW (ref 150–400)
RBC: 3.96 MIL/uL (ref 3.87–5.11)
RDW: 12.5 % (ref 11.5–15.5)
WBC: 5.6 10*3/uL (ref 4.0–10.5)

## 2018-10-31 NOTE — Progress Notes (Signed)
Pt is aware of the results and plan.

## 2018-10-31 NOTE — Progress Notes (Signed)
Called, many rings and no answer.

## 2018-11-01 ENCOUNTER — Other Ambulatory Visit: Payer: Self-pay

## 2018-11-01 DIAGNOSIS — R7989 Other specified abnormal findings of blood chemistry: Secondary | ICD-10-CM

## 2018-11-01 NOTE — Progress Notes (Signed)
Lab order on file for 01/31/2019.

## 2018-11-04 ENCOUNTER — Telehealth: Payer: Self-pay | Admitting: Gastroenterology

## 2018-11-04 NOTE — Telephone Encounter (Signed)
Pt has procedure with SF tomorrow and has questions about her medications and her prep. Please call her at [phone]

## 2018-11-04 NOTE — Telephone Encounter (Signed)
Called pt, answered questions.

## 2018-11-05 ENCOUNTER — Ambulatory Visit (HOSPITAL_COMMUNITY)
Admission: RE | Admit: 2018-11-05 | Discharge: 2018-11-05 | Disposition: A | Discharge status: Home / Self Care | Payer: Medicare Other | Attending: Gastroenterology | Admitting: Gastroenterology

## 2018-11-05 ENCOUNTER — Ambulatory Visit (HOSPITAL_COMMUNITY): Payer: Medicare Other | Admitting: Anesthesiology

## 2018-11-05 ENCOUNTER — Encounter (HOSPITAL_COMMUNITY): Payer: Self-pay

## 2018-11-05 ENCOUNTER — Encounter (HOSPITAL_COMMUNITY)
Admission: RE | Disposition: A | Discharge status: Home / Self Care | Payer: Self-pay | Source: Home / Self Care | Attending: Gastroenterology

## 2018-11-05 DIAGNOSIS — Z7984 Long term (current) use of oral hypoglycemic drugs: Secondary | ICD-10-CM | POA: Diagnosis not present

## 2018-11-05 DIAGNOSIS — F419 Anxiety disorder, unspecified: Secondary | ICD-10-CM | POA: Diagnosis not present

## 2018-11-05 DIAGNOSIS — Z79899 Other long term (current) drug therapy: Secondary | ICD-10-CM | POA: Insufficient documentation

## 2018-11-05 DIAGNOSIS — D120 Benign neoplasm of cecum: Secondary | ICD-10-CM | POA: Insufficient documentation

## 2018-11-05 DIAGNOSIS — K219 Gastro-esophageal reflux disease without esophagitis: Secondary | ICD-10-CM | POA: Diagnosis not present

## 2018-11-05 DIAGNOSIS — K2970 Gastritis, unspecified, without bleeding: Secondary | ICD-10-CM | POA: Diagnosis not present

## 2018-11-05 DIAGNOSIS — Z1211 Encounter for screening for malignant neoplasm of colon: Secondary | ICD-10-CM | POA: Diagnosis not present

## 2018-11-05 DIAGNOSIS — K648 Other hemorrhoids: Secondary | ICD-10-CM | POA: Diagnosis not present

## 2018-11-05 DIAGNOSIS — R1310 Dysphagia, unspecified: Secondary | ICD-10-CM | POA: Insufficient documentation

## 2018-11-05 DIAGNOSIS — K644 Residual hemorrhoidal skin tags: Secondary | ICD-10-CM | POA: Insufficient documentation

## 2018-11-05 DIAGNOSIS — Q438 Other specified congenital malformations of intestine: Secondary | ICD-10-CM | POA: Diagnosis not present

## 2018-11-05 DIAGNOSIS — K2950 Unspecified chronic gastritis without bleeding: Secondary | ICD-10-CM | POA: Diagnosis not present

## 2018-11-05 DIAGNOSIS — I10 Essential (primary) hypertension: Secondary | ICD-10-CM | POA: Diagnosis not present

## 2018-11-05 DIAGNOSIS — E119 Type 2 diabetes mellitus without complications: Secondary | ICD-10-CM | POA: Diagnosis not present

## 2018-11-05 DIAGNOSIS — R1319 Other dysphagia: Secondary | ICD-10-CM

## 2018-11-05 DIAGNOSIS — Z7982 Long term (current) use of aspirin: Secondary | ICD-10-CM | POA: Insufficient documentation

## 2018-11-05 HISTORY — PX: POLYPECTOMY: SHX5525

## 2018-11-05 HISTORY — PX: COLONOSCOPY WITH PROPOFOL: SHX5780

## 2018-11-05 HISTORY — PX: ESOPHAGOGASTRODUODENOSCOPY (EGD) WITH PROPOFOL: SHX5813

## 2018-11-05 HISTORY — PX: SAVORY DILATION: SHX5439

## 2018-11-05 LAB — GLUCOSE, CAPILLARY
Glucose-Capillary: 121 mg/dL — ABNORMAL HIGH (ref 70–99)
Glucose-Capillary: 79 mg/dL (ref 70–99)

## 2018-11-05 SURGERY — COLONOSCOPY WITH PROPOFOL
Anesthesia: Monitor Anesthesia Care

## 2018-11-05 MED ORDER — STERILE WATER FOR IRRIGATION IR SOLN
Status: DC | PRN
  Administered 2018-11-05: 100 mL

## 2018-11-05 MED ORDER — PROPOFOL 10 MG/ML IV BOLUS
INTRAVENOUS | Status: DC | PRN
  Administered 2018-11-05: 40 mg via INTRAVENOUS
  Administered 2018-11-05: 20 mg via INTRAVENOUS
  Administered 2018-11-05: 10 mg via INTRAVENOUS
  Administered 2018-11-05: 20 mg via INTRAVENOUS
  Administered 2018-11-05 (×2): 40 mg via INTRAVENOUS

## 2018-11-05 MED ORDER — GLYCOPYRROLATE 0.2 MG/ML IJ SOLN
INTRAMUSCULAR | Status: DC | PRN
  Administered 2018-11-05: 0.2 mg via INTRAVENOUS

## 2018-11-05 MED ORDER — LACTATED RINGERS IV SOLN
INTRAVENOUS | Status: DC
  Administered 2018-11-05: 12:00:00 via INTRAVENOUS

## 2018-11-05 MED ORDER — CHLORHEXIDINE GLUCONATE CLOTH 2 % EX PADS: 6.0000 | MEDICATED_PAD | Freq: Once | CUTANEOUS | Status: DC

## 2018-11-05 MED ORDER — PROPOFOL 500 MG/50ML IV EMUL
INTRAVENOUS | Status: DC | PRN
  Administered 2018-11-05: 120 ug/kg/min via INTRAVENOUS
  Administered 2018-11-05: 200 ug/kg/min via INTRAVENOUS
  Administered 2018-11-05: 100 ug/kg/min via INTRAVENOUS

## 2018-11-05 MED ORDER — MINERAL OIL PO OIL
TOPICAL_OIL | ORAL | Status: AC
  Filled 2018-11-05: qty 60

## 2018-11-05 NOTE — H&P (Signed)
Primary Care Physician:  Caren Macadam, MD Primary Gastroenterologist:  Dr. Oneida Alar  Pre-Procedure History & Physical: HPI:  Barbara House is a 63 y.o. female here for DYSPHAGIA/screening.  Past Medical History:  Diagnosis Date  . Allergic rhinitis   . Anxiety disorder   . Arthritis   . GERD (gastroesophageal reflux disease)   . Headache   . History of migraine   . Hypertension   . Type 2 diabetes mellitus ([place])    Not sure when she was diagnosed, greater than five years.     Past Surgical History:  Procedure Laterality Date  . ABDOMINAL HYSTERECTOMY     has ovaries.   [name] CARDIAC CATHETERIZATION N/A 08/03/2016   Procedure: Left Heart Cath and Coronary Angiography;  Surgeon: Peter M Martinique, MD;  Location: [facility];  Service: Cardiovascular;  Laterality: N/A;  . Miinor skin surgery      Prior to Admission medications   Medication Sig Start Date End Date Taking? Authorizing Provider  aspirin EC 81 MG tablet Take 81 mg by mouth daily.   Yes [provider]  atorvastatin (LIPITOR) 40 MG tablet Take 0.5 tablets (20 mg total) by mouth daily. 03/27/18  Yes Hagler, Apolonio Schneiders, MD  Azelastine HCl 0.15 % SOLN Place 1 spray into the nose at bedtime as needed. Patient taking differently: Place 1 spray into the nose at bedtime as needed (congestion).  08/18/15  Yes Gean Quint, MD  cholecalciferol (VITAMIN D) 1000 units tablet Take 1,000 Units by mouth daily.   Yes [provider]  cycloSPORINE (RESTASIS) 0.05 % ophthalmic emulsion Place 1 drop into both eyes 2 (two) times daily as needed (for eye dryness.).   Yes [provider]  dexlansoprazole (DEXILANT) 60 MG capsule Take 1 capsule (60 mg total) by mouth daily before breakfast. 08/09/18  Yes Mahala Menghini, PA-C  fluticasone (FLONASE) 50 MCG/ACT nasal spray Place 1-2 sprays into both nostrils daily as needed (for sinus issues.). 09/14/16  Yes Valentina Shaggy, MD  gabapentin (NEURONTIN) 100 MG  capsule TAKE ONE CAPSULE BY MOUTH TWICE DAILY. Patient taking differently: Take 100 mg by mouth 2 (two) times daily as needed (pain).  06/24/18  Yes Hagler, Apolonio Schneiders, MD  glipiZIDE (GLUCOTROL XL) 10 MG 24 hr tablet TAKE ONE TABLET BY MOUTH DAILY. TAKE WITH BREAKFAST. Patient taking differently: Take 10 mg by mouth daily with breakfast.  06/24/18  Yes Hagler, Apolonio Schneiders, MD  levocetirizine (XYZAL) 5 MG tablet Take 1 tablet (5 mg total) by mouth every evening. 01/02/17  Yes Valentina Shaggy, MD  linagliptin (TRADJENTA) 5 MG TABS tablet Take 1 tablet (5 mg total) by mouth daily. 03/14/18  Yes Caren Macadam, MD  lisinopril (PRINIVIL,ZESTRIL) 40 MG tablet TAKE ONE TABLET BY MOUTH DAILY. Patient taking differently: Take 40 mg by mouth daily.  06/24/18  Yes Caren Macadam, MD  metFORMIN (GLUCOPHAGE) 1000 MG tablet TAKE ONE TABLET BY MOUTH TWICE DAILY Patient taking differently: Take 1,000 mg by mouth 2 (two) times daily with a meal.  06/24/18  Yes Hagler, Apolonio Schneiders, MD  montelukast (SINGULAIR) 10 MG tablet TAKE 1 TABLET (10 MG TOTAL) BY MOUTH AT BEDTIME AS NEEDED (FOR ALLERGIES.). 07/03/18  Yes Padgett, Rae Halsted, MD  nabumetone (RELAFEN) 750 MG tablet Take 750 mg by mouth daily as needed (pain).  11/03/16  Yes [provider]  PARoxetine (PAXIL) 40 MG tablet TAKE ONE TABLET BY MOUTH EVERY MORNING. Patient taking differently: Take 40 mg by mouth daily.  01/01/18  Yes Caren Macadam, MD  ranitidine (ZANTAC) 150 MG tablet Take 150 mg by mouth daily.  08/31/17  Yes [provider]  triamterene-hydrochlorothiazide (MAXZIDE-25) 37.5-25 MG tablet Take 1 tablet by mouth daily. 03/08/18  Yes Caren Macadam, MD  UNABLE TO FIND Diabetic shoe 08/08/17  Yes Caren Macadam, MD  verapamil (CALAN-SR) 240 MG CR tablet TAKE ONE TABLET BY MOUTH EVERY EVENING. Patient taking differently: Take 240 mg by mouth daily after breakfast.  06/24/18  Yes Hagler, Apolonio Schneiders, MD  hydrocortisone 2.5 % ointment Apply topically 2  (two) times daily. Patient not taking: Reported on 10/25/2018 03/08/18   Caren Macadam, MD  Olopatadine HCl (PATANASE) 0.6 % SOLN Use 1-2 sprays each nostril daily Patient not taking: Reported on 10/25/2018 01/02/17   Valentina Shaggy, MD    Allergies as of 08/15/2018 - Review Complete 08/09/2018  Allergen Reaction Noted  . Prednisone Other (See Comments) 09/15/2016    Family History  Problem Relation Age of Onset  . Hypertension Mother   . Diabetes Mother   . Hypertension Son   . Diabetes Son   . Cancer Father   . Cirrhosis Brother   . Alcohol abuse Brother   . Colon cancer Neg Hx     Social History   Socioeconomic History  . Marital status: Single    Spouse name: Not on file  . Number of children: Not on file  . Years of education: Not on file  . Highest education level: Not on file  Occupational History  . Not on file  Social Needs  . Financial resource strain: Not on file  . Food insecurity:    Worry: Not on file    Inability: Not on file  . Transportation needs:    Medical: Not on file    Non-medical: Not on file  Tobacco Use  . Smoking status: Never Smoker  . Smokeless tobacco: Never Used  Substance and Sexual Activity  . Alcohol use: No    Alcohol/week: 0.0 standard drinks  . Drug use: No  . Sexual activity: Not Currently    Birth control/protection: Surgical  Lifestyle  . Physical activity:    Days per week: 7 days    Minutes per session: 20 min  . Stress: Only a little  Relationships  . Social connections:    Talks on phone: Not on file    Gets together: Not on file    Attends religious service: Not on file    Active member of club or organization: Not on file    Attends meetings of clubs or organizations: Not on file    Relationship status: Not on file  . Intimate partner violence:    Fear of current or ex partner: Not on file    Emotionally abused: Not on file    Physically abused: Not on file    Forced sexual activity: Not on file   Other Topics Concern  . Not on file  Social History Narrative   Reports is on disability. Has one son. Lives in [place]. Enjoys time with family. Enjoys Firefighter. Attends church.     Review of Systems: See HPI, otherwise negative ROS   Physical Exam: BP (!) 166/94   Pulse 62   Temp (!) 96.4 F (35.8 C) (Axillary)   Resp 18   SpO2 98%  General:   Alert,  pleasant and cooperative in NAD Head:  Normocephalic and atraumatic. Neck:  Supple; Lungs:  Clear throughout to auscultation.    Heart:  Regular  rate and rhythm. Abdomen:  Soft, nontender and nondistended. Normal bowel sounds, without guarding, and without rebound.   Neurologic:  Alert and  oriented x4;  grossly normal neurologically.  Impression/Plan:     DYSPHAGIA/screening  PLAN:  EGD/DIL/tcs TODAY. DISCUSSED PROCEDURE, BENEFITS, & RISKS: < 1% chance of medication reaction, bleeding, perforation, or rupture of spleen/liver.

## 2018-11-05 NOTE — Anesthesia Postprocedure Evaluation (Signed)
Anesthesia Post Note  Patient: Barbara House  Procedure(s) Performed: COLONOSCOPY WITH PROPOFOL (N/A ) ESOPHAGOGASTRODUODENOSCOPY (EGD) WITH PROPOFOL (N/A ) SAVORY DILATION (N/A ) POLYPECTOMY  Patient location during evaluation: PACU Anesthesia Type: MAC Level of consciousness: awake and alert and patient cooperative Pain management: satisfactory to patient Vital Signs Assessment: post-procedure vital signs reviewed and stable Respiratory status: spontaneous breathing Cardiovascular status: stable Postop Assessment: no apparent nausea or vomiting Anesthetic complications: no     Last Vitals:  Vitals:   11/05/18 1109 11/05/18 1455  BP: (!) 166/94 (P) 135/88  Pulse: 62   Resp: 18 (P) 16  Temp: (!) 35.8 C (!) (P) 36.4 C  SpO2: 98% (P) 100%    Last Pain:  Vitals:   11/05/18 1451  TempSrc:   PainSc: 0-No pain                 Aarron Wierzbicki

## 2018-11-05 NOTE — Op Note (Signed)
[facility] Patient Name: Barbara House Procedure Date: 11/05/2018 1:36 PM MRN: [id number] Date of Birth: 1956-08-04 Attending MD: Barney Drain MD, MD CSN: [id number] Age: 63 Admit Type: Outpatient Procedure:                Colonoscopy WITH COLD FORCEPS POLYPECTOMY Indications:              Screening for colorectal malignant neoplasm Providers:                Barney Drain MD, MD, Rosina Lowenstein, RN, Nelma Rothman,                            Technician Referring MD:             Caren Macadam Medicines:                Propofol per Anesthesia Complications:            No immediate complications. Estimated Blood Loss:     Estimated blood loss was minimal. Procedure:                Pre-Anesthesia Assessment:                           - Prior to the procedure, a History and Physical                            was performed, and patient medications and                            allergies were reviewed. The patient's tolerance of                            previous anesthesia was also reviewed. The risks                            and benefits of the procedure and the sedation                            options and risks were discussed with the patient.                            All questions were answered, and informed consent                            was obtained. Prior Anticoagulants: The patient has                            taken aspirin, last dose was 7 days prior to                            procedure. ASA Grade Assessment: II - A patient                            with mild systemic disease. After reviewing the  risks and benefits, the patient was deemed in                            satisfactory condition to undergo the procedure.                            After obtaining informed consent, the colonoscope                            was passed under direct vision. Throughout the                            procedure, the patient's blood pressure, pulse,  and                            oxygen saturations were monitored continuously. The                            [id number]-H190DL ([id number]) scope was introduced through                            the anus and advanced to the the cecum, identified                            by appendiceal orifice and ileocecal valve. The                            colonoscopy was somewhat difficult due to a                            tortuous colon. Successful completion of the                            procedure was aided by straightening and shortening                            the scope to obtain bowel loop reduction and                            COLOWRAP. The patient tolerated the procedure well.                            The quality of the bowel preparation was good. The                            ileocecal valve, appendiceal orifice, and rectum                            were photographed. Scope In: 2:20:18 PM Scope Out: 2:33:05 PM Scope Withdrawal Time: 0 hours 10 minutes 13 seconds  Total Procedure Duration: 0 hours 12 minutes 47 seconds  Findings:      A 3 mm polyp was found in the cecum. The polyp was sessile. The polyp  was removed with a cold biopsy forceps. Resection and retrieval were       complete.      External and internal hemorrhoids were found.      The recto-sigmoid colon and sigmoid colon were mildly tortuous. Impression:               - One 3 mm polyp in the cecum, removed with a cold                            biopsy forceps. Resected and retrieved.                           - External and internal hemorrhoids.                           - Tortuous LEFT colon. Moderate Sedation:      Per Anesthesia Care Recommendation:           - Patient has a contact number available for                            emergencies. The signs and symptoms of potential                            delayed complications were discussed with the                            patient. Return to normal  activities tomorrow.                            Written discharge instructions were provided to the                            patient.                           - High fiber diet.                           - Continue present medications.                           - Await pathology results.                           - Repeat colonoscopy in 5-10 years for surveillance. Procedure Code(s):        --- Professional ---                           [phone], Colonoscopy, flexible; with biopsy, single                            or multiple Diagnosis Code(s):        --- Professional ---                           Z12.11, Encounter for screening for malignant  neoplasm of colon                           D12.0, Benign neoplasm of cecum                           K64.8, Other hemorrhoids                           Q43.8, Other specified congenital malformations of                            intestine CPT copyright 2018 American Medical Association. All rights reserved. The codes documented in this report are preliminary and upon coder review may  be revised to meet current compliance requirements. Barney Drain, MD Barney Drain MD, MD 11/05/2018 3:08:02 PM This report has been signed electronically. Number of Addenda: 0

## 2018-11-05 NOTE — Op Note (Signed)
[facility] Patient Name: Barbara House Procedure Date: 11/05/2018 2:34 PM MRN: [id number] Date of Birth: [birth year] Attending MD: Barney Drain MD, MD CSN: [id number] Age: 63 Admit Type: Outpatient Procedure:                Upper GI endoscopy WITH COLD FORCEPS                            BIOPSY/ESOPHAGEAL DILATION Indications:              Dysphagia Providers:                Barney Drain MD, MD, Rosina Lowenstein, RN, Nelma Rothman,                            Technician Referring MD:             Caren Macadam Medicines:                Propofol per Anesthesia Complications:            No immediate complications. Estimated Blood Loss:     Estimated blood loss was minimal. Procedure:                Pre-Anesthesia Assessment:                           - Prior to the procedure, a History and Physical                            was performed, and patient medications and                            allergies were reviewed. The patient's tolerance of                            previous anesthesia was also reviewed. The risks                            and benefits of the procedure and the sedation                            options and risks were discussed with the patient.                            All questions were answered, and informed consent                            was obtained. Prior Anticoagulants: The patient has                            taken aspirin, last dose was 7 days prior to                            procedure. ASA Grade Assessment: II - A patient  with mild systemic disease. After reviewing the                            risks and benefits, the patient was deemed in                            satisfactory condition to undergo the procedure.                            After obtaining informed consent, the endoscope was                            passed under direct vision. Throughout the                            procedure, the patient's blood  pressure, pulse, and                            oxygen saturations were monitored continuously. The                            [id number]-[id number] ([id number]) scope was introduced through the                            mouth, and advanced to the second part of duodenum.                            The upper GI endoscopy was accomplished without                            difficulty. The patient tolerated the procedure                            well. Scope In: 2:38:54 PM Scope Out: 2:47:11 PM Total Procedure Duration: 0 hours 8 minutes 17 seconds  Findings:      No endoscopic abnormality was evident in the esophagus to explain the       patient's complaint of dysphagia. A guidewire was placed and the scope       was withdrawn. Dilation was performed with a Savary dilator with mild       resistance at 16 mm and 17 mm DUE TO POSSIBLE PROXIMAL ESOPHAGEAL WEB.       Estimated blood loss was minimal.      Localized mild inflammation characterized by congestion (edema) and       erythema was found in the gastric antrum. Biopsies were taken with a       cold forceps for Helicobacter pylori testing.      The examined duodenum was normal. Impression:               - No endoscopic esophageal abnormality to explain                            patient's dysphagia. Dilated.                           -  MILD Gastritis. Biopsied. Moderate Sedation:      Per Anesthesia Care Recommendation:           - Patient has a contact number available for                            emergencies. The signs and symptoms of potential                            delayed complications were discussed with the                            patient. Return to normal activities tomorrow.                            Written discharge instructions were provided to the                            patient.                           - High fiber diet and low fat diet. LOSE WEIGHT TO                            BMI < 30.                           -  Continue present medications.                           - Await pathology results.                           - Return to my office in 4 months. Procedure Code(s):        --- Professional ---                           [phone], Esophagogastroduodenoscopy, flexible,                            transoral; with insertion of guide wire followed by                            passage of dilator(s) through esophagus over guide                            wire                           28413, 59, Esophagogastroduodenoscopy, flexible,                            transoral; with biopsy, single or multiple Diagnosis Code(s):        --- Professional ---                           R13.10, Dysphagia, unspecified  K29.70, Gastritis, unspecified, without bleeding CPT copyright 2018 American Medical Association. All rights reserved. The codes documented in this report are preliminary and upon coder review may  be revised to meet current compliance requirements. Barney Drain, MD Barney Drain MD, MD 11/05/2018 3:12:41 PM This report has been signed electronically. Number of Addenda: 0

## 2018-11-05 NOTE — Transfer of Care (Signed)
Immediate Anesthesia Transfer of Care Note  Patient: Barbara House  Procedure(s) Performed: COLONOSCOPY WITH PROPOFOL (N/A ) ESOPHAGOGASTRODUODENOSCOPY (EGD) WITH PROPOFOL (N/A ) SAVORY DILATION (N/A ) POLYPECTOMY  Patient Location: PACU  Anesthesia Type:MAC  Level of Consciousness: awake and patient cooperative  Airway & Oxygen Therapy: Patient Spontanous Breathing  Post-op Assessment: Report given to RN and Post -op Vital signs reviewed and stable  Post vital signs: Reviewed and stable  Last Vitals:  Vitals Value Taken Time  BP 135/88 11/05/2018  2:55 PM  Temp    Pulse    Resp    SpO2    Vitals shown include unvalidated device data.  Last Pain:  Vitals:   11/05/18 1451  TempSrc:   PainSc: 0-No pain         Complications: No apparent anesthesia complicationsGlasses to PACU with patient.

## 2018-11-05 NOTE — Discharge Instructions (Signed)
You had 1 polyp removed. YOU HAVE A SMALL HIATAL HERNIA & GASTRITIS. I STRETCHED YOUR ESOPHAGUS DUE YOUR PROBLEMS SWALLOWING. I BIOPSIED YOUR STOMACH.   DRINK WATER TO KEEP YOUR URINE LIGHT YELLOW.  CONTINUE YOUR WEIGHT LOSS EFFORTS. YOUR BODY MASS INDEX IS OVER 30 WHICH MEANS YOU ARE OBESE. OBESITY IS ASSOCIATED WITH AN INCREASED FOR CIRRHOSIS AND ALL CANCERS, INCLUDING ESOPHAGEAL AND COLON CANCER. A WEIGHT OF 155 LBS OR LESS  WILL GET YOUR BODY MASS INDEX(BMI) UNDER 30.  FOLLOW A HIGH FIBER DIET. AVOID ITEMS THAT CAUSE BLOATING. SEE INFO BELOW.  YOUR BIOPSY RESULTS WILL BE BACK IN 5 BUSINESS DAYS.  Follow up in 4 mos.  Next colonoscopy in 5-10 years.   ENDOSCOPY Care After Read the instructions outlined below and refer to this sheet in the next week. These discharge instructions provide you with general information on caring for yourself after you leave the hospital. While your treatment has been planned according to the most current medical practices available, unavoidable complications occasionally occur. If you have any problems or questions after discharge, call DR. Rosbel Buckner, [phone].  ACTIVITY  You may resume your regular activity, but move at a slower pace for the next 24 hours.   Take frequent rest periods for the next 24 hours.   Walking will help get rid of the air and reduce the bloated feeling in your belly (abdomen).   No driving for 24 hours (because of the medicine (anesthesia) used during the test).   You may shower.   Do not sign any important legal documents or operate any machinery for 24 hours (because of the anesthesia used during the test).    NUTRITION  Drink plenty of fluids.   You may resume your normal diet as instructed by your doctor.   Begin with a light meal and progress to your normal diet. Heavy or fried foods are harder to digest and may make you feel sick to your stomach (nauseated).   Avoid alcoholic beverages for 24 hours or as  instructed.    MEDICATIONS  You may resume your normal medications.   WHAT YOU CAN EXPECT TODAY  Some feelings of bloating in the abdomen.   Passage of more gas than usual.   Spotting of blood in your stool or on the toilet paper  .  IF YOU HAD POLYPS REMOVED DURING THE ENDOSCOPY:  Eat a soft diet IF YOU HAVE NAUSEA, BLOATING, ABDOMINAL PAIN, OR VOMITING.    FINDING OUT THE RESULTS OF YOUR TEST Not all test results are available during your visit. DR. Oneida Alar WILL CALL YOU WITHIN 7 DAYS OF YOUR PROCEDUE WITH YOUR RESULTS. Do not assume everything is normal if you have not heard from DR. Emilina Smarr IN ONE WEEK, CALL HER OFFICE AT [phone].  SEEK IMMEDIATE MEDICAL ATTENTION AND CALL THE OFFICE: [phone] IF:  You have more than a spotting of blood in your stool.   Your belly is swollen (abdominal distention).   You are nauseated or vomiting.   You have a temperature over 101F.   You have abdominal pain or discomfort that is severe or gets worse throughout the day.   High-Fiber Diet A high-fiber diet changes your normal diet to include more whole grains, legumes, fruits, and vegetables. Changes in the diet involve replacing refined carbohydrates with unrefined foods. The calorie level of the diet is essentially unchanged. The Dietary Reference Intake (recommended amount) for adult males is 38 grams per day. For adult females, it is 25 grams  per day. Pregnant and lactating women should consume 28 grams of fiber per day. Fiber is the intact part of a plant that is not broken down during digestion. Functional fiber is fiber that has been isolated from the plant to provide a beneficial effect in the body.  PURPOSE  Increase stool bulk.   Ease and regulate bowel movements.   Lower cholesterol.   REDUCE RISK OF COLON CANCER  INDICATIONS THAT YOU NEED MORE FIBER  Constipation and hemorrhoids.   Uncomplicated diverticulosis (intestine condition) and irritable bowel  syndrome.   Weight management.   As a protective measure against hardening of the arteries (atherosclerosis), diabetes, and cancer.   GUIDELINES FOR INCREASING FIBER IN THE DIET  Start adding fiber to the diet slowly. A gradual increase of about 5 more grams (2 slices of whole-wheat bread, 2 servings of most fruits or vegetables, or 1 bowl of high-fiber cereal) per day is best. Too rapid an increase in fiber may result in constipation, flatulence, and bloating.   Drink enough water and fluids to keep your urine clear or pale yellow. Water, juice, or caffeine-free drinks are recommended. Not drinking enough fluid may cause constipation.   Eat a variety of high-fiber foods rather than one type of fiber.   Try to increase your intake of fiber through using high-fiber foods rather than fiber pills or supplements that contain small amounts of fiber.   The goal is to change the types of food eaten. Do not supplement your present diet with high-fiber foods, but replace foods in your present diet.   INCLUDE A VARIETY OF FIBER SOURCES  Replace refined and processed grains with whole grains, canned fruits with fresh fruits, and incorporate other fiber sources. White rice, white breads, and most bakery goods contain little or no fiber.   Brown whole-grain rice, buckwheat oats, and many fruits and vegetables are all good sources of fiber. These include: broccoli, Brussels sprouts, cabbage, cauliflower, beets, sweet potatoes, white potatoes (skin on), carrots, tomatoes, eggplant, squash, berries, fresh fruits, and dried fruits.   Cereals appear to be the richest source of fiber. Cereal fiber is found in whole grains and bran. Bran is the fiber-rich outer coat of cereal grain, which is largely removed in refining. In whole-grain cereals, the bran remains. In breakfast cereals, the largest amount of fiber is found in those with "bran" in their names. The fiber content is sometimes indicated on the label.     You may need to include additional fruits and vegetables each day.   In baking, for 1 cup white flour, you may use the following substitutions:   1 cup whole-wheat flour minus 2 tablespoons.   1/2 cup white flour plus 1/2 cup whole-wheat flour.    Polyps, Colon  A polyp is extra tissue that grows inside your body. Colon polyps grow in the large intestine. The large intestine, also called the colon, is part of your digestive system. It is a long, hollow tube at the end of your digestive tract where your body makes and stores stool. Most polyps are not dangerous. They are benign. This means they are not cancerous. But over time, some types of polyps can turn into cancer. Polyps that are smaller than a pea are usually not harmful. But larger polyps could someday become or may already be cancerous. To be safe, doctors remove all polyps and test them.   WHO GETS POLYPS? Anyone can get polyps, but certain people are more likely than others. You may  have a greater chance of getting polyps if:  You are over 50.   You have had polyps before.   Someone in your family has had polyps.   Someone in your family has had cancer of the large intestine.   Find out if someone in your family has had polyps. You may also be more likely to get polyps if you:   Eat a lot of fatty foods   Smoke   Drink alcohol   Do not exercise  Eat too much   PREVENTION There is not one sure way to prevent polyps. You might be able to lower your risk of getting them if you:  Eat more fruits and vegetables and less fatty food.   Do not smoke.   Avoid alcohol.   Exercise every day.   Lose weight if you are overweight.   Eating more calcium and folate can also lower your risk of getting polyps. Some foods that are rich in calcium are milk, cheese, and broccoli. Some foods that are rich in folate are chickpeas, kidney beans, and spinach.    Hiatal Hernia A hiatal hernia occurs when a part of the stomach  slides above the diaphragm. The diaphragm is the thin muscle separating the belly (abdomen) from the chest. A hiatal hernia can be something you are born with or develop over time. Hiatal hernias may allow stomach acid to flow back into your esophagus, the tube which carries food from your mouth to your stomach. If this acid causes problems it is called GERD (gastro-esophageal reflux disease).   Gastritis  Gastritis is an inflammation (the body's way of reacting to injury and/or infection) of the stomach. It is often caused by viral or bacterial (germ) infections. It can also be caused BY ASPIRIN, BC/GOODY POWDER'S, (IBUPROFEN) MOTRIN, OR ALEVE (NAPROXEN), chemicals (including alcohol), SPICY FOODS, and medications. This illness may be associated with generalized malaise (feeling tired, not well), UPPER ABDOMINAL STOMACH cramps, and fever. One common bacterial cause of gastritis is an organism known as H. Pylori. This can be treated with antibiotics.

## 2018-11-05 NOTE — Anesthesia Procedure Notes (Signed)
Procedure Name: MAC Date/Time: 11/05/2018 2:06 PM Performed by: Vista Deck, CRNA Pre-anesthesia Checklist: Patient identified, Emergency Drugs available, Suction available, Timeout performed and Patient being monitored Patient Re-evaluated:Patient Re-evaluated prior to induction Oxygen Delivery Method: Nasal Cannula

## 2018-11-05 NOTE — Anesthesia Preprocedure Evaluation (Signed)
Anesthesia Evaluation  Patient identified by MRN, date of birth, ID band Patient awake    Reviewed: Allergy & Precautions, H&P , NPO status , Patient's Chart, lab work & pertinent test results  Airway Mallampati: II  TM Distance: >3 FB Neck ROM: full    Dental no notable dental hx.    Pulmonary neg pulmonary ROS,    Pulmonary exam normal breath sounds clear to auscultation       Cardiovascular Exercise Tolerance: Good hypertension, + angina  Rhythm:regular Rate:Normal     Neuro/Psych  Headaches, PSYCHIATRIC DISORDERS Anxiety    GI/Hepatic Neg liver ROS, GERD  ,  Endo/Other  negative endocrine ROSdiabetes  Renal/GU negative Renal ROS  negative genitourinary   Musculoskeletal   Abdominal   Peds  Hematology negative hematology ROS (+)   Anesthesia Other Findings   Reproductive/Obstetrics negative OB ROS                             Anesthesia Physical Anesthesia Plan  ASA: II  Anesthesia Plan: MAC   Post-op Pain Management:    Induction:   PONV Risk Score and Plan:   Airway Management Planned:   Additional Equipment:   Intra-op Plan:   Post-operative Plan:   Informed Consent: I have reviewed the patients History and Physical, chart, labs and discussed the procedure including the risks, benefits and alternatives for the proposed anesthesia with the patient or authorized representative who has indicated his/her understanding and acceptance.     Dental Advisory Given  Plan Discussed with: CRNA  Anesthesia Plan Comments:         Anesthesia Quick Evaluation

## 2018-11-05 NOTE — Anesthesia Procedure Notes (Signed)
Procedure Name: MAC Date/Time: 11/05/2018 2:15 PM Performed by: Vista Deck, CRNA Pre-anesthesia Checklist: Patient identified, Emergency Drugs available, Suction available, Timeout performed and Patient being monitored Patient Re-evaluated:Patient Re-evaluated prior to induction Oxygen Delivery Method: Non-rebreather mask

## 2018-11-06 ENCOUNTER — Ambulatory Visit: Payer: Medicare Other | Admitting: Allergy & Immunology

## 2018-11-07 ENCOUNTER — Telehealth: Payer: Self-pay

## 2018-11-07 NOTE — Telephone Encounter (Signed)
Patient aware.

## 2018-11-07 NOTE — Telephone Encounter (Signed)
Pt called to say she had procedures on Tuesday 11/05/2018. Yesterday she got sick on the stomach and started vomiting some. Today she has been vomiting and cannot keep food on her stomach. She has kept a little liquids down. She has some abdominal pain in the middle of her stomach, intermittently.  She has not had diarrhea.  She has had a few chills. I told her to try to get liquids down and if she is unable to she should go to the ED.  I am sending Dr. Oneida Alar a message.

## 2018-11-07 NOTE — Telephone Encounter (Signed)
I have sent a pager to Dr. Oneida Alar.

## 2018-11-07 NOTE — Telephone Encounter (Signed)
PLEASE CALL PT. IF SHE FEEL SBAD SHE SHOULD GO TO THE ED TO BE EVALUATE FOR NAUSEA, VOMITING, AND ABDOMINAL PAIN.. IT SOUNDS LIKE SHE MAY HAVE A VIRAL ILLNESS.

## 2018-11-08 ENCOUNTER — Encounter (HOSPITAL_COMMUNITY): Payer: Self-pay | Admitting: Gastroenterology

## 2018-11-08 ENCOUNTER — Telehealth: Payer: Self-pay | Admitting: Gastroenterology

## 2018-11-08 NOTE — Telephone Encounter (Signed)
Please call pt. She had ONE simple adenoma removed. HER stomach Bx shows gastritis.   DRINK WATER TO KEEP YOUR URINE LIGHT YELLOW.  CONTINUE YOUR WEIGHT LOSS EFFORTS. A WEIGHT OF 155 LBS OR LESS  WILL GET YOUR BODY MASS INDEX(BMI) UNDER 30.  FOLLOW A HIGH FIBER DIET. AVOID ITEMS THAT CAUSE BLOATING.   Follow up in 4 mos.  Next colonoscopy in 5-10 years.

## 2018-11-09 DIAGNOSIS — R079 Chest pain, unspecified: Secondary | ICD-10-CM | POA: Diagnosis not present

## 2018-11-09 DIAGNOSIS — Z7984 Long term (current) use of oral hypoglycemic drugs: Secondary | ICD-10-CM | POA: Diagnosis not present

## 2018-11-09 DIAGNOSIS — K219 Gastro-esophageal reflux disease without esophagitis: Secondary | ICD-10-CM | POA: Diagnosis not present

## 2018-11-09 DIAGNOSIS — Z7982 Long term (current) use of aspirin: Secondary | ICD-10-CM | POA: Diagnosis not present

## 2018-11-09 DIAGNOSIS — R109 Unspecified abdominal pain: Secondary | ICD-10-CM | POA: Diagnosis not present

## 2018-11-09 DIAGNOSIS — K910 Vomiting following gastrointestinal surgery: Secondary | ICD-10-CM | POA: Diagnosis not present

## 2018-11-09 DIAGNOSIS — E119 Type 2 diabetes mellitus without complications: Secondary | ICD-10-CM | POA: Diagnosis not present

## 2018-11-09 DIAGNOSIS — I10 Essential (primary) hypertension: Secondary | ICD-10-CM | POA: Diagnosis not present

## 2018-11-09 DIAGNOSIS — F419 Anxiety disorder, unspecified: Secondary | ICD-10-CM | POA: Diagnosis not present

## 2018-11-09 DIAGNOSIS — Z79899 Other long term (current) drug therapy: Secondary | ICD-10-CM | POA: Diagnosis not present

## 2018-11-11 ENCOUNTER — Telehealth: Payer: Self-pay | Admitting: Gastroenterology

## 2018-11-11 NOTE — Telephone Encounter (Signed)
SCHEDULED AND ON RECALL  °

## 2018-11-11 NOTE — Telephone Encounter (Signed)
Called, message said no one available to take call, please call again.

## 2018-11-11 NOTE — Telephone Encounter (Signed)
Patient called wanting someone to call her, said slf did her procedure on her throat.

## 2018-11-12 ENCOUNTER — Telehealth: Payer: Self-pay

## 2018-11-12 NOTE — Telephone Encounter (Signed)
  PLEASE CALL PT. WE NEED TO KNOW WHAT STRIPS SHE USES. THEN  CALL IN FOR 30 DAYS WITH REFILL x2.

## 2018-11-12 NOTE — Telephone Encounter (Signed)
Pt asks [place] to call [facility] in [place] [phone]) and find out.

## 2018-11-12 NOTE — Telephone Encounter (Signed)
PT is aware.

## 2018-11-12 NOTE — Telephone Encounter (Signed)
See result note, pt is aware.  

## 2018-11-12 NOTE — Telephone Encounter (Signed)
Called, many rings and no answer.

## 2018-11-12 NOTE — Telephone Encounter (Signed)
Pt said Dr. Mannie Stabile has moved and she does not have an appointment with her at the new place until March. She has been our of her strips for 2 weeks. She is supposed to check her Blood sugar bid. She wants to know if Dr. Oneida Alar will wend in a prescription for her for the strips to [facility] in [place]. Dr.Fields, please advise!

## 2018-11-13 NOTE — Telephone Encounter (Signed)
CHECK BG TIWCE A DAY.

## 2018-11-13 NOTE — Telephone Encounter (Signed)
I called [facility] in [place] and spoke to [place]. I explained to her that Dr. Oneida Alar is going to refill diabetic strips til pt sees PCP in March. She said to write it on RX and fax it to them at [phone].  She said to put down how many times pt needs to check BS daily. Her Rx from Dr. Mannie Stabile was once a day. Pt said she is supposed to check bid. Dr. Oneida Alar, please advise what for me to do and I will fax back.

## 2018-11-13 NOTE — Telephone Encounter (Signed)
Order has been faxed

## 2018-11-13 NOTE — Telephone Encounter (Signed)
PT is aware.

## 2018-11-14 NOTE — Telephone Encounter (Signed)
Faxed paperwork to [facility].

## 2018-11-14 NOTE — Telephone Encounter (Signed)
Received paperwork from [facility] for Dr. Oneida Alar.  Placed on Dr. Oneida Alar desk ( chair).

## 2018-11-14 NOTE — Telephone Encounter (Signed)
PAPERWORK COMPLETE. 

## 2018-11-15 NOTE — Telephone Encounter (Signed)
T/C from [place] at [facility] in [place], asked if Dr. Oneida Alar manages pt's diabetes.  I told her no, she was just trying to help her since pt's PCP has moved and she cannot be seen until March.  She said that Medicare requires that the doctor that manages the diabetes order the supplies.  She said she will call the pt to tell her.

## 2018-11-15 NOTE — Telephone Encounter (Signed)
REVIEWED-NO ADDITIONAL RECOMMENDATIONS. 

## 2018-11-20 ENCOUNTER — Telehealth: Payer: Self-pay

## 2018-11-20 MED ORDER — DEXLANSOPRAZOLE 60 MG PO CPDR: 60.0000 mg | DELAYED_RELEASE_CAPSULE | Freq: Every day | ORAL | 3 refills | Status: DC

## 2018-11-20 NOTE — Addendum Note (Signed)
Addended by: Annitta Needs on: 11/20/2018 10:41 AM   Modules accepted: Orders

## 2018-11-20 NOTE — Telephone Encounter (Signed)
Done

## 2018-11-20 NOTE — Telephone Encounter (Signed)
Refill request received from [facility] for Dexilant 60 mg take one tab daily.

## 2018-11-22 ENCOUNTER — Ambulatory Visit: Payer: Medicare Other | Admitting: Allergy & Immunology

## 2018-11-29 ENCOUNTER — Ambulatory Visit: Payer: Self-pay | Admitting: Allergy & Immunology

## 2018-12-03 DIAGNOSIS — B351 Tinea unguium: Secondary | ICD-10-CM | POA: Diagnosis not present

## 2018-12-03 DIAGNOSIS — E1149 Type 2 diabetes mellitus with other diabetic neurological complication: Secondary | ICD-10-CM | POA: Diagnosis not present

## 2018-12-03 DIAGNOSIS — M79674 Pain in right toe(s): Secondary | ICD-10-CM | POA: Diagnosis not present

## 2018-12-03 DIAGNOSIS — M79675 Pain in left toe(s): Secondary | ICD-10-CM | POA: Diagnosis not present

## 2018-12-03 DIAGNOSIS — L600 Ingrowing nail: Secondary | ICD-10-CM | POA: Diagnosis not present

## 2018-12-03 DIAGNOSIS — L602 Onychogryphosis: Secondary | ICD-10-CM | POA: Diagnosis not present

## 2018-12-03 DIAGNOSIS — L84 Corns and callosities: Secondary | ICD-10-CM | POA: Diagnosis not present

## 2018-12-04 ENCOUNTER — Ambulatory Visit: Payer: Medicare Other | Admitting: Allergy & Immunology

## 2018-12-09 DIAGNOSIS — R269 Unspecified abnormalities of gait and mobility: Secondary | ICD-10-CM | POA: Diagnosis not present

## 2018-12-09 DIAGNOSIS — R251 Tremor, unspecified: Secondary | ICD-10-CM | POA: Diagnosis not present

## 2018-12-11 ENCOUNTER — Ambulatory Visit (INDEPENDENT_AMBULATORY_CARE_PROVIDER_SITE_OTHER): Payer: Medicare Other | Admitting: Allergy & Immunology

## 2018-12-11 ENCOUNTER — Encounter: Payer: Self-pay | Admitting: Allergy & Immunology

## 2018-12-11 VITALS — BP 110/70 | HR 72 | Temp 98.0°F | Resp 18 | Ht 61.25 in | Wt 162.0 lb

## 2018-12-11 DIAGNOSIS — K219 Gastro-esophageal reflux disease without esophagitis: Secondary | ICD-10-CM | POA: Diagnosis not present

## 2018-12-11 DIAGNOSIS — J3089 Other allergic rhinitis: Secondary | ICD-10-CM

## 2018-12-11 DIAGNOSIS — J0101 Acute recurrent maxillary sinusitis: Secondary | ICD-10-CM | POA: Diagnosis not present

## 2018-12-11 DIAGNOSIS — R490 Dysphonia: Secondary | ICD-10-CM | POA: Diagnosis not present

## 2018-12-11 MED ORDER — AMOXICILLIN-POT CLAVULANATE 875-125 MG PO TABS: 1.0000 | ORAL_TABLET | Freq: Two times a day (BID) | ORAL | 0 refills | Status: AC

## 2018-12-11 NOTE — Progress Notes (Signed)
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 Perennial allergic rhinitis with hoarseness - Continue with recommend that you use nasal sprays on a DAILY basis: nasal saline rinses twice daily + Flonase two sprays per nostril daily + Patanase two sprays per nostril 2 times daily  - Continue with montelukast 10mg  daily. - Continue with Xyzal 5mg  daily.  - These medications need to be used EVERY DAY for the best effect.  - I would make an appointment with Barbara House again.  2. GERD (reflux) - Continue with Dexilant 30mg  daily and Ranitidine 150mg  2 times a day.  3. Recurrent infections - With your current symptoms and time course, antibiotics are needed: Augmentin 875mg  twice daily for 7 days - Add on nasal saline spray (i.e., Simply Saline) or nasal saline lavage (i.e., NeilMed) as needed prior to medicated nasal sprays. - For thick post nasal drainage, add guaifenesin [phone] mg (Mucinex) twice daily as needed for mucous thinning with adequate hydration to help it work.   4. Return in about 6 months (around 06/11/2019).   Subjective:   Barbara House is a 63 y.o. female presenting today for follow up of  Chief Complaint  Patient presents with  . Sinusitis    runny nose. developing sores in her nose because of all the wiping. some yellow nasal discharge as well as a cough with yellow sputum. no fever. body aches and chills. symptoms ongoing x2 weeks.     Barbara House has a history of the following: Patient Active Problem List   Diagnosis Date Noted  . Gastritis without peptic ulcer disease   . Esophageal dysphagia 08/09/2018  . Encounter for screening colonoscopy  08/09/2018  . Hyperlipidemia LDL goal <70 10/12/2017  . Anxiety disorder   . Type 2 diabetes mellitus (Barbara House)   . Hypertension   . Allergic rhinitis   . Angina pectoris (Barbara House) 08/03/2016  . Abnormal nuclear stress test 08/03/2016  . Abnormal myocardial perfusion study   . Rhinitis 06/25/2015  . GERD (gastroesophageal reflux disease) 06/25/2015  . Diabetes mellitus (Barbara House) 06/25/2015    History obtained from: chart review and patient.  Barbara House is a 63 y.o. female presenting for a sick visit. She was last seen in September 2019. At that time, we emphasized the need to use her medications on a routine basis, including nasal saline rinses, fluticasone nasal spray, and Patanase two sprays per nostril twice daily. We also emhpasized the need to use montelukast as well as levocetirizine. GERD was somewhat controlled with Dexilant 30mg  daily and ranitidine 150mg  BID. We did do an immune workup at that time since she was having a bout of multiple episodes of sinusitis. Her immune workup was mostly normal with protection to 12/23 types of Pneumococcus. She was not protective to Diphtheria and Tetanus. We recommended that she get a Tdap and repeat the titers; it does not seem that this was ever done.   In the interim, she reports that she continues to have hoarseness. She also endorse sinus pressure that has been ongoing for 4-6 weeks. She has not had a fever but she is having green purulent discharge. Se has gone to Urgent Care at some point, although her history is rather vague. It  is unclear where and when she went to the Urgent Care. In any case she was given a course of antibiotics that she did not complete. She has not seen an ENT recently, but she has been followed by Barbara House in the past.   She is using her nasal sprays, but she seems confused when I mention the specific names (both brand names and generic names). It is unclear whether she is using any of her medications whatsoever.   She continues to  see Barbara House for her GI issues. Her last colonoscopy and endoscopy in January 2020 demonstrated gastritis. 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   Otherwise, there have been no changes to her past medical history, surgical history, family history, or social history.    Review of Systems  Constitutional: Negative.  Negative for fever, malaise/fatigue and weight loss.  HENT: Positive for congestion, nosebleeds, sinus pain and sore throat. Negative for ear discharge and ear pain.   Eyes: Negative for pain, discharge and redness.  Respiratory: Positive for stridor. Negative for cough, sputum production, shortness of breath and wheezing.   Cardiovascular: Negative.  Negative for chest pain and palpitations.  Gastrointestinal: Negative for abdominal pain and heartburn.  Skin: Negative.  Negative for itching and rash.  Neurological: Negative for dizziness and headaches.  Endo/Heme/Allergies: Negative for environmental allergies. Does not bruise/bleed easily.       Objective:   Blood pressure 110/70, pulse 72, temperature 98 F (36.7 C), temperature source Oral, resp. rate 18, height 5' 1.25" (1.556 m), weight 162 lb (73.5 kg), SpO2 98 %. Body mass index is 30.36 kg/m.   Physical Exam:  Physical Exam  Constitutional: She appears well-developed.  HENT:  Head: Normocephalic and atraumatic.  Right Ear: Tympanic membrane, external ear and ear canal normal.  Left Ear: Tympanic membrane and ear canal normal.  Nose: Mucosal edema present. No rhinorrhea, nasal deformity or septal deviation. No epistaxis. Right sinus exhibits maxillary sinus tenderness and frontal sinus tenderness. Left sinus exhibits maxillary sinus tenderness and frontal sinus tenderness.  Mouth/Throat: Uvula is midline and oropharynx is clear and moist. Mucous membranes are not pale and not dry.  Congestion present with green purulent discharge.  Eyes: Pupils are equal, round, and reactive to light.  Conjunctivae and EOM are normal. Right eye exhibits no chemosis and no discharge. Left eye exhibits no chemosis and no discharge. Right conjunctiva is not injected. Left conjunctiva is not injected.  Cardiovascular: Normal rate, regular rhythm and normal heart sounds.  Respiratory: Effort normal and breath sounds normal. No accessory muscle usage. No tachypnea. No respiratory distress. She has no wheezes. She has no rhonchi. She has no rales. She exhibits no tenderness.  Lymphadenopathy:    She has no cervical adenopathy.  Neurological: She is alert.  Skin: No abrasion, no petechiae and no rash noted. Rash is not papular, not vesicular and not urticarial. No erythema. No pallor.  Psychiatric: She has a normal mood and affect.     Diagnostic studies: none      Salvatore Marvel, MD  [facility]

## 2018-12-11 NOTE — Patient Instructions (Addendum)
1. Perennial allergic rhinitis with hoarseness - Continue with recommend that you use nasal sprays on a DAILY basis: nasal saline rinses twice daily + Flonase two sprays per nostril daily + Patanase two sprays per nostril 2 times daily  - Continue with montelukast 10mg  daily. - Continue with Xyzal 5mg  daily.  - These medications need to be used EVERY DAY for the best effect.  - I would make an appointment with Dr. Benjamine Mola again.  2. GERD (reflux) - Continue with Dexilant 30mg  daily and Ranitidine 150mg  2 times a day.  3. Recurrent infections - With your current symptoms and time course, antibiotics are needed: Augmentin 875mg  twice daily for 7 days - Add on nasal saline spray (i.e., Simply Saline) or nasal saline lavage (i.e., NeilMed) as needed prior to medicated nasal sprays. - For thick post nasal drainage, add guaifenesin [phone] mg (Mucinex) twice daily as needed for mucous thinning with adequate hydration to help it work.   4. Return in about 6 months (around 06/11/2019).    Please inform us of any Emergency Department visits, hospitalizations, or changes in symptoms. Call us before going to the ED for breathing or allergy symptoms since we might be able to fit you in for a sick visit. Feel free to contact us anytime with any questions, problems, or concerns.  It was a pleasure to see you again today!  Websites that have reliable patient information: 1. American Academy of Asthma, Allergy, and Immunology: www.aaaai.org 2. Food Allergy Research and Education (FARE): foodallergy.org 3. Mothers of Asthmatics: http://www.asthmacommunitynetwork.org 4. American College of Allergy, Asthma, and Immunology: MonthlyElectricBill.co.uk   Make sure you are registered to vote! If you have moved or changed any of your contact information, you will need to get this updated before voting!

## 2018-12-12 ENCOUNTER — Encounter: Payer: Self-pay | Admitting: Allergy & Immunology

## 2018-12-25 ENCOUNTER — Ambulatory Visit: Payer: Medicare Other | Admitting: Allergy & Immunology

## 2019-01-07 DIAGNOSIS — I10 Essential (primary) hypertension: Secondary | ICD-10-CM | POA: Diagnosis not present

## 2019-01-07 DIAGNOSIS — G8929 Other chronic pain: Secondary | ICD-10-CM | POA: Diagnosis not present

## 2019-01-07 DIAGNOSIS — E1143 Type 2 diabetes mellitus with diabetic autonomic (poly)neuropathy: Secondary | ICD-10-CM | POA: Diagnosis not present

## 2019-01-07 DIAGNOSIS — K219 Gastro-esophageal reflux disease without esophagitis: Secondary | ICD-10-CM | POA: Diagnosis not present

## 2019-01-07 DIAGNOSIS — Z0000 Encounter for general adult medical examination without abnormal findings: Secondary | ICD-10-CM | POA: Diagnosis not present

## 2019-01-07 DIAGNOSIS — E559 Vitamin D deficiency, unspecified: Secondary | ICD-10-CM | POA: Diagnosis not present

## 2019-01-07 DIAGNOSIS — R202 Paresthesia of skin: Secondary | ICD-10-CM | POA: Diagnosis not present

## 2019-01-07 DIAGNOSIS — M25561 Pain in right knee: Secondary | ICD-10-CM | POA: Diagnosis not present

## 2019-01-07 DIAGNOSIS — M25562 Pain in left knee: Secondary | ICD-10-CM | POA: Diagnosis not present

## 2019-01-07 DIAGNOSIS — E782 Mixed hyperlipidemia: Secondary | ICD-10-CM | POA: Diagnosis not present

## 2019-01-07 DIAGNOSIS — Z7984 Long term (current) use of oral hypoglycemic drugs: Secondary | ICD-10-CM | POA: Diagnosis not present

## 2019-01-07 DIAGNOSIS — E119 Type 2 diabetes mellitus without complications: Secondary | ICD-10-CM | POA: Diagnosis not present

## 2019-01-22 DIAGNOSIS — E538 Deficiency of other specified B group vitamins: Secondary | ICD-10-CM | POA: Diagnosis not present

## 2019-01-22 DIAGNOSIS — D696 Thrombocytopenia, unspecified: Secondary | ICD-10-CM | POA: Diagnosis not present

## 2019-02-07 DIAGNOSIS — I10 Essential (primary) hypertension: Secondary | ICD-10-CM | POA: Diagnosis not present

## 2019-02-07 DIAGNOSIS — K219 Gastro-esophageal reflux disease without esophagitis: Secondary | ICD-10-CM | POA: Diagnosis not present

## 2019-02-07 DIAGNOSIS — E538 Deficiency of other specified B group vitamins: Secondary | ICD-10-CM | POA: Diagnosis not present

## 2019-02-07 DIAGNOSIS — F419 Anxiety disorder, unspecified: Secondary | ICD-10-CM | POA: Diagnosis not present

## 2019-02-07 DIAGNOSIS — Z7984 Long term (current) use of oral hypoglycemic drugs: Secondary | ICD-10-CM | POA: Diagnosis not present

## 2019-02-07 DIAGNOSIS — E1143 Type 2 diabetes mellitus with diabetic autonomic (poly)neuropathy: Secondary | ICD-10-CM | POA: Diagnosis not present

## 2019-02-24 DIAGNOSIS — E538 Deficiency of other specified B group vitamins: Secondary | ICD-10-CM | POA: Diagnosis not present

## 2019-02-25 DIAGNOSIS — Z1231 Encounter for screening mammogram for malignant neoplasm of breast: Secondary | ICD-10-CM | POA: Diagnosis not present

## 2019-03-06 ENCOUNTER — Encounter: Payer: Self-pay | Admitting: Gastroenterology

## 2019-03-06 ENCOUNTER — Ambulatory Visit (INDEPENDENT_AMBULATORY_CARE_PROVIDER_SITE_OTHER): Payer: Medicare Other | Admitting: Gastroenterology

## 2019-03-06 ENCOUNTER — Other Ambulatory Visit: Payer: Self-pay

## 2019-03-06 DIAGNOSIS — K219 Gastro-esophageal reflux disease without esophagitis: Secondary | ICD-10-CM

## 2019-03-06 DIAGNOSIS — R1319 Other dysphagia: Secondary | ICD-10-CM

## 2019-03-06 DIAGNOSIS — K2970 Gastritis, unspecified, without bleeding: Secondary | ICD-10-CM | POA: Diagnosis not present

## 2019-03-06 DIAGNOSIS — R1310 Dysphagia, unspecified: Secondary | ICD-10-CM

## 2019-03-06 DIAGNOSIS — L218 Other seborrheic dermatitis: Secondary | ICD-10-CM | POA: Diagnosis not present

## 2019-03-06 NOTE — Progress Notes (Addendum)
REVIEWED-NO ADDITIONAL RECOMMENDATIONS.  Primary Care Physician:  Caren Macadam, MD Primary GI:  Barney Drain, MD   Patient Location: Home  Provider Location: [place] office  Reason for Phone Visit: GERD, dysphagia  Persons present on the phone encounter, with roles: Patient, myself (provider),Barbara House CMA (updated meds and allergies)  Total time (minutes) spent on medical discussion: 12 minutes  Due to COVID-19, visit was conducted using telephonic method (no video was available).  Visit was requested by patient.  Virtual Visit via Telephone only  I connected with Barbara House on 03/06/19 at 10:30 AM EDT by telephone and verified that I am speaking with the correct person using two identifiers.   I discussed the limitations, risks, security and privacy concerns of performing an evaluation and management service by telephone and the availability of in person appointments. I also discussed with the patient that there may be a patient responsible charge related to this service. The patient expressed understanding and agreed to proceed.  Chief Complaint  Patient presents with  . Follow-up    Acid reflux    HPI:   Patient is a pleasant 63 y/o female who presents for telephone visit regarding chronic GERD and follow up EGD/TCS. She was last seen in office in 07/2018 as new patient. C/o chronic gerd, dysphagia solid foods. Some epigastric pain. Due for TCS.  Patient has EGD/TCS in 10/2018. 61mm tubular adenoma removed from the cecum, int/ext hemorrhoids. Esophageal dilatation performed due to dysphagia, mild gastritis but no h.pylori. Next colonoscopy in 5-10 years.   Labs in 10/2018 with mildly low Hgb of 11.3, platelets 93000. Advised to stop ASA and Relafen and repeat CBC in 3 months. She is due at this point, if still abnormal there are plans for hematology evaluation. Abd u/s in 2018 with normal liver and spleen.   Ran out of Dexilant. Acid reflux came back real bad while  off medication. Was out about 5 days. Really bad burning. Doing better now. Dysphagia much better. Takes ASA, doesn't take relafen that she knows of. BM regular. No melena, brbpr. Never had to her labs rechecked.    Current Outpatient Medications  Medication Sig Dispense Refill  . aspirin EC 81 MG tablet Take 81 mg by mouth daily.    [name] atorvastatin (LIPITOR) 40 MG tablet Take 0.5 tablets (20 mg total) by mouth daily.    . Azelastine HCl 0.15 % SOLN Place 1 spray into the nose at bedtime as needed. (Patient taking differently: Place 1 spray into the nose at bedtime as needed (congestion). ) 30 mL 5  . cycloSPORINE (RESTASIS) 0.05 % ophthalmic emulsion Place 1 drop into both eyes 2 (two) times daily as needed (for eye dryness.).    [name] dexlansoprazole (DEXILANT) 60 MG capsule Take 1 capsule (60 mg total) by mouth daily before breakfast. 90 capsule 3  . fluticasone (FLONASE) 50 MCG/ACT nasal spray Place 1-2 sprays into both nostrils daily as needed (for sinus issues.). 16 g 5  . gabapentin (NEURONTIN) 100 MG capsule TAKE ONE CAPSULE BY MOUTH TWICE DAILY. (Patient taking differently: as needed. ) 180 capsule 0  . glipiZIDE (GLUCOTROL XL) 10 MG 24 hr tablet TAKE ONE TABLET BY MOUTH DAILY. TAKE WITH BREAKFAST. (Patient taking differently: Take 10 mg by mouth daily with breakfast. ) 90 tablet 0  . levocetirizine (XYZAL) 5 MG tablet Take 1 tablet (5 mg total) by mouth every evening. (Patient taking differently: Take 5 mg by mouth as needed. ) 30 tablet 3  .  linagliptin (TRADJENTA) 5 MG TABS tablet Take 1 tablet (5 mg total) by mouth daily. 90 tablet 0  . lisinopril (PRINIVIL,ZESTRIL) 40 MG tablet TAKE ONE TABLET BY MOUTH DAILY. (Patient taking differently: Take 40 mg by mouth daily. ) 90 tablet 0  . metFORMIN (GLUCOPHAGE) 1000 MG tablet TAKE ONE TABLET BY MOUTH TWICE DAILY (Patient taking differently: Take 1,000 mg by mouth 2 (two) times daily with a meal. ) 180 tablet 0  . montelukast (SINGULAIR) 10 MG  tablet TAKE 1 TABLET (10 MG TOTAL) BY MOUTH AT BEDTIME AS NEEDED (FOR ALLERGIES.). 90 tablet 1  . nabumetone (RELAFEN) 750 MG tablet Take 750 mg by mouth as needed (pain).   5  . PARoxetine (PAXIL) 40 MG tablet TAKE ONE TABLET BY MOUTH EVERY MORNING. (Patient taking differently: Take 40 mg by mouth daily. ) 90 tablet 0  . triamterene-hydrochlorothiazide (MAXZIDE-25) 37.5-25 MG tablet Take 1 tablet by mouth daily. 90 tablet 3  . UNABLE TO FIND Diabetic shoe 1 each 0  . verapamil (CALAN-SR) 240 MG CR tablet TAKE ONE TABLET BY MOUTH EVERY EVENING. (Patient taking differently: Take 240 mg by mouth daily after breakfast. ) 90 tablet 0   No current facility-administered medications for this visit.     ROS:  General: Negative for anorexia, weight loss, fever, chills, fatigue, weakness. Eyes: Negative for vision changes.  ENT: Negative for hoarseness, difficulty swallowing , nasal congestion. CV: Negative for chest pain, angina, palpitations, dyspnea on exertion, peripheral edema.  Respiratory: Negative for dyspnea at rest, dyspnea on exertion, cough, sputum, wheezing.  GI: See history of present illness. GU:  Negative for dysuria, hematuria, urinary incontinence, urinary frequency, nocturnal urination.  MS: Negative for joint pain, low back pain.  Derm: Negative for rash or itching.  Neuro: Negative for weakness, abnormal sensation, seizure, frequent headaches, memory loss, confusion.  Psych: Negative for anxiety, depression, suicidal ideation, hallucinations.  Endo: Negative for unusual weight change.  Heme: Negative for bruising or bleeding. Allergy: Negative for rash or hives.   Observations/Objective: Pleasant cooperative no acute distress.  Exam otherwise unavailable.  Lab Results  Component Value Date   CREATININE 1.00 10/30/2018   BUN 22 (H) 10/30/2018   NA 138 10/30/2018   K 4.0 10/30/2018   CL 105 10/30/2018   CO2 25 10/30/2018   Lab Results  Component Value Date   WBC 5.6  10/30/2018   HGB 11.3 (L) 10/30/2018   HCT 36.3 10/30/2018   MCV 91.7 10/30/2018   PLT 93 (L) 10/30/2018   Lab Results  Component Value Date   ALT 17 03/08/2018   AST 19 03/08/2018   BILITOT 0.4 03/08/2018    Assessment and Plan: Pleasant 63 year old female with history of chronic GERD, dysphagia presenting for follow-up.  She had a EGD and colonoscopy in January.  Esophagus appeared normal, status post dilation for history of dysphagia.  Patient reports resolution of swallowing problems.  She also had mild gastritis in the setting of aspirin and Relafen.  No H. pylori.  Single tubular adenoma removed from the cecum, next colonoscopy in 5 to 10 years.  Recently had significant flareup of GERD when she ran out of [place].  Symptoms are now improved.  Plan to continue Dexilant 60 mg daily.  Reinforced antireflux measures.  Mildly low hemoglobin, thrombocytopenia noted on preop labs in January.  She continues aspirin she continues aspirin daily despite recommendations to stop.  She believes she is not taking Relafen.  She will go to the lab at  her convenience for repeat CBC as per initial plan.  Return to the office in 6 months.  Follow Up Instructions:    I discussed the assessment and treatment plan with the patient. The patient was provided an opportunity to ask questions and all were answered. The patient agreed with the plan and demonstrated an understanding of the instructions. AVS mailed to patient's home address.   The patient was advised to call back or seek an in-person evaluation if the symptoms worsen or if the condition fails to improve as anticipated.  I provided 12 minutes of non-face-to-face time during this encounter.   Neil Crouch, PA-C

## 2019-03-06 NOTE — Patient Instructions (Signed)
1. Continue Dexilant 60mg  daily before breakfast.  2. Please go to [facility] at your convenience to get labs.  3. Return to the office in six months to see Dr. Oneida Alar.    Gastroesophageal Reflux Disease, Adult Gastroesophageal reflux (GER) happens when acid from the stomach flows up into the tube that connects the mouth and the stomach (esophagus). Normally, food travels down the esophagus and stays in the stomach to be digested. However, when a person has GER, food and stomach acid sometimes move back up into the esophagus. If this becomes a more serious problem, the person may be diagnosed with a disease called gastroesophageal reflux disease (GERD). GERD occurs when the reflux:  Happens often.  Causes frequent or severe symptoms.  Causes problems such as damage to the esophagus. When stomach acid comes in contact with the esophagus, the acid may cause soreness (inflammation) in the esophagus. Over time, GERD may create small holes (ulcers) in the lining of the esophagus. What are the causes? This condition is caused by a problem with the muscle between the esophagus and the stomach (lower esophageal sphincter, or LES). Normally, the LES muscle closes after food passes through the esophagus to the stomach. When the LES is weakened or abnormal, it does not close properly, and that allows food and stomach acid to go back up into the esophagus. The LES can be weakened by certain dietary substances, medicines, and medical conditions, including:  Tobacco use.  Pregnancy.  Having a hiatal hernia.  Alcohol use.  Certain foods and beverages, such as coffee, chocolate, onions, and peppermint. What increases the risk? You are more likely to develop this condition if you:  Have an increased body weight.  Have a connective tissue disorder.  Use NSAID medicines. What are the signs or symptoms? Symptoms of this condition include:  Heartburn.  Difficult or painful swallowing.  The  feeling of having a lump in the throat.  Abitter taste in the mouth.  Bad breath.  Having a large amount of saliva.  Having an upset or bloated stomach.  Belching.  Chest pain. Different conditions can cause chest pain. Make sure you see your health care provider if you experience chest pain.  Shortness of breath or wheezing.  Ongoing (chronic) cough or a night-time cough.  Wearing away of tooth enamel.  Weight loss. How is this diagnosed? Your health care provider will take a medical history and perform a physical exam. To determine if you have mild or severe GERD, your health care provider may also monitor how you respond to treatment. You may also have tests, including:  A test to examine your stomach and esophagus with a small camera (endoscopy).  A test thatmeasures the acidity level in your esophagus.  A test thatmeasures how much pressure is on your esophagus.  A barium swallow or modified barium swallow test to show the shape, size, and functioning of your esophagus. How is this treated? The goal of treatment is to help relieve your symptoms and to prevent complications. Treatment for this condition may vary depending on how severe your symptoms are. Your health care provider may recommend:  Changes to your diet.  Medicine.  Surgery. Follow these instructions at home: Eating and drinking   Follow a diet as recommended by your health care provider. This may involve avoiding foods and drinks such as: ? Coffee and tea (with or without caffeine). ? Drinks that containalcohol. ? Energy drinks and sports drinks. ? Carbonated drinks or sodas. ? Chocolate  and cocoa. ? Peppermint and mint flavorings. ? Garlic and onions. ? Horseradish. ? Spicy and acidic foods, including peppers, chili powder, curry powder, vinegar, hot sauces, and barbecue sauce. ? Citrus fruit juices and citrus fruits, such as oranges, lemons, and limes. ? Tomato-based foods, such as red  sauce, chili, salsa, and pizza with red sauce. ? Fried and fatty foods, such as donuts, french fries, potato chips, and high-fat dressings. ? High-fat meats, such as hot dogs and fatty cuts of red and white meats, such as rib eye steak, sausage, ham, and bacon. ? High-fat dairy items, such as whole milk, butter, and cream cheese.  Eat small, frequent meals instead of large meals.  Avoid drinking large amounts of liquid with your meals.  Avoid eating meals during the 2-3 hours before bedtime.  Avoid lying down right after you eat.  Do not exercise right after you eat. Lifestyle   Do not use any products that contain nicotine or tobacco, such as cigarettes, e-cigarettes, and chewing tobacco. If you need help quitting, ask your health care provider.  Try to reduce your stress by using methods such as yoga or meditation. If you need help reducing stress, ask your health care provider.  If you are overweight, reduce your weight to an amount that is healthy for you. Ask your health care provider for guidance about a safe weight loss goal. General instructions  Pay attention to any changes in your symptoms.  Take over-the-counter and prescription medicines only as told by your health care provider. Do not take aspirin, ibuprofen, or other NSAIDs unless your health care provider told you to do so.  Wear loose-fitting clothing. Do not wear anything tight around your waist that causes pressure on your abdomen.  Raise (elevate) the head of your bed about 6 inches (15 cm).  Avoid bending over if this makes your symptoms worse.  Keep all follow-up visits as told by your health care provider. This is important. Contact a health care provider if:  You have: ? New symptoms. ? Unexplained weight loss. ? Difficulty swallowing or it hurts to swallow. ? Wheezing or a persistent cough. ? A hoarse voice.  Your symptoms do not improve with treatment. Get help right away if you:  Have pain in  your arms, neck, jaw, teeth, or back.  Feel sweaty, dizzy, or light-headed.  Have chest pain or shortness of breath.  Vomit and your vomit looks like blood or coffee grounds.  Faint.  Have stool that is bloody or black.  Cannot swallow, drink, or eat. Summary  Gastroesophageal reflux happens when acid from the stomach flows up into the esophagus. GERD is a disease in which the reflux happens often, causes frequent or severe symptoms, or causes problems such as damage to the esophagus.  Treatment for this condition may vary depending on how severe your symptoms are. Your health care provider may recommend diet and lifestyle changes, medicine, or surgery.  Contact a health care provider if you have new or worsening symptoms.  Take over-the-counter and prescription medicines only as told by your health care provider. Do not take aspirin, ibuprofen, or other NSAIDs unless your health care provider told you to do so.  Keep all follow-up visits as told by your health care provider. This is important. This information is not intended to replace advice given to you by your health care provider. Make sure you discuss any questions you have with your health care provider. Document Released: 07/12/2005 Document Revised: 04/10/2018 Document  Reviewed: 04/10/2018 Elsevier Interactive Patient Education  [employer].

## 2019-03-11 DIAGNOSIS — L600 Ingrowing nail: Secondary | ICD-10-CM | POA: Diagnosis not present

## 2019-03-11 DIAGNOSIS — E1149 Type 2 diabetes mellitus with other diabetic neurological complication: Secondary | ICD-10-CM | POA: Diagnosis not present

## 2019-03-11 DIAGNOSIS — L84 Corns and callosities: Secondary | ICD-10-CM | POA: Diagnosis not present

## 2019-03-11 NOTE — Progress Notes (Signed)
CC'ED TO PCP 

## 2019-03-17 ENCOUNTER — Telehealth: Payer: Self-pay

## 2019-03-17 NOTE — Telephone Encounter (Signed)
Pt left Vm to call and I called and she just wants to know when her next apt will be. I told her she is supposed to see Dr. Oneida Alar in 6 months and we will send a reminder or appt.  She said that is fine.

## 2019-03-17 NOTE — Telephone Encounter (Signed)
REVIEWED-NO ADDITIONAL RECOMMENDATIONS. 

## 2019-03-31 ENCOUNTER — Other Ambulatory Visit: Payer: Self-pay | Admitting: *Deleted

## 2019-03-31 MED ORDER — MONTELUKAST SODIUM 10 MG PO TABS: 10.0000 mg | ORAL_TABLET | Freq: Every evening | ORAL | 0 refills | Status: DC | PRN

## 2019-04-02 DIAGNOSIS — Z888 Allergy status to other drugs, medicaments and biological substances status: Secondary | ICD-10-CM | POA: Diagnosis not present

## 2019-04-02 DIAGNOSIS — M5136 Other intervertebral disc degeneration, lumbar region: Secondary | ICD-10-CM | POA: Diagnosis not present

## 2019-04-02 DIAGNOSIS — E1143 Type 2 diabetes mellitus with diabetic autonomic (poly)neuropathy: Secondary | ICD-10-CM | POA: Diagnosis not present

## 2019-04-02 DIAGNOSIS — R51 Headache: Secondary | ICD-10-CM | POA: Diagnosis not present

## 2019-04-02 DIAGNOSIS — I10 Essential (primary) hypertension: Secondary | ICD-10-CM | POA: Diagnosis not present

## 2019-04-02 DIAGNOSIS — K219 Gastro-esophageal reflux disease without esophagitis: Secondary | ICD-10-CM | POA: Diagnosis not present

## 2019-04-02 DIAGNOSIS — F419 Anxiety disorder, unspecified: Secondary | ICD-10-CM | POA: Diagnosis not present

## 2019-04-02 DIAGNOSIS — J328 Other chronic sinusitis: Secondary | ICD-10-CM | POA: Diagnosis not present

## 2019-04-02 DIAGNOSIS — N179 Acute kidney failure, unspecified: Secondary | ICD-10-CM | POA: Diagnosis not present

## 2019-04-02 DIAGNOSIS — Z7984 Long term (current) use of oral hypoglycemic drugs: Secondary | ICD-10-CM | POA: Diagnosis not present

## 2019-04-02 DIAGNOSIS — F329 Major depressive disorder, single episode, unspecified: Secondary | ICD-10-CM | POA: Diagnosis not present

## 2019-04-02 DIAGNOSIS — R0989 Other specified symptoms and signs involving the circulatory and respiratory systems: Secondary | ICD-10-CM | POA: Diagnosis not present

## 2019-04-02 DIAGNOSIS — E1165 Type 2 diabetes mellitus with hyperglycemia: Secondary | ICD-10-CM | POA: Diagnosis not present

## 2019-04-02 DIAGNOSIS — E860 Dehydration: Secondary | ICD-10-CM | POA: Diagnosis not present

## 2019-04-02 DIAGNOSIS — R109 Unspecified abdominal pain: Secondary | ICD-10-CM | POA: Diagnosis not present

## 2019-04-02 DIAGNOSIS — M1288 Other specific arthropathies, not elsewhere classified, other specified site: Secondary | ICD-10-CM | POA: Diagnosis not present

## 2019-04-02 DIAGNOSIS — Z79899 Other long term (current) drug therapy: Secondary | ICD-10-CM | POA: Diagnosis not present

## 2019-04-02 DIAGNOSIS — Z7982 Long term (current) use of aspirin: Secondary | ICD-10-CM | POA: Diagnosis not present

## 2019-04-02 DIAGNOSIS — K3184 Gastroparesis: Secondary | ICD-10-CM | POA: Diagnosis not present

## 2019-04-02 DIAGNOSIS — R110 Nausea: Secondary | ICD-10-CM | POA: Diagnosis not present

## 2019-04-02 DIAGNOSIS — R112 Nausea with vomiting, unspecified: Secondary | ICD-10-CM | POA: Diagnosis not present

## 2019-04-03 DIAGNOSIS — N179 Acute kidney failure, unspecified: Secondary | ICD-10-CM | POA: Diagnosis not present

## 2019-04-04 DIAGNOSIS — N179 Acute kidney failure, unspecified: Secondary | ICD-10-CM | POA: Diagnosis not present

## 2019-04-11 DIAGNOSIS — I10 Essential (primary) hypertension: Secondary | ICD-10-CM | POA: Diagnosis not present

## 2019-04-11 DIAGNOSIS — K219 Gastro-esophageal reflux disease without esophagitis: Secondary | ICD-10-CM | POA: Diagnosis not present

## 2019-04-11 DIAGNOSIS — E782 Mixed hyperlipidemia: Secondary | ICD-10-CM | POA: Diagnosis not present

## 2019-04-11 DIAGNOSIS — E1143 Type 2 diabetes mellitus with diabetic autonomic (poly)neuropathy: Secondary | ICD-10-CM | POA: Diagnosis not present

## 2019-04-11 DIAGNOSIS — E538 Deficiency of other specified B group vitamins: Secondary | ICD-10-CM | POA: Diagnosis not present

## 2019-04-11 DIAGNOSIS — D696 Thrombocytopenia, unspecified: Secondary | ICD-10-CM | POA: Diagnosis not present

## 2019-04-11 DIAGNOSIS — Z7984 Long term (current) use of oral hypoglycemic drugs: Secondary | ICD-10-CM | POA: Diagnosis not present

## 2019-04-11 DIAGNOSIS — Z9289 Personal history of other medical treatment: Secondary | ICD-10-CM | POA: Diagnosis not present

## 2019-04-11 DIAGNOSIS — L659 Nonscarring hair loss, unspecified: Secondary | ICD-10-CM | POA: Diagnosis not present

## 2019-04-11 DIAGNOSIS — E559 Vitamin D deficiency, unspecified: Secondary | ICD-10-CM | POA: Diagnosis not present

## 2019-04-29 DIAGNOSIS — R112 Nausea with vomiting, unspecified: Secondary | ICD-10-CM | POA: Diagnosis not present

## 2019-04-29 DIAGNOSIS — R110 Nausea: Secondary | ICD-10-CM | POA: Diagnosis not present

## 2019-05-13 DIAGNOSIS — I10 Essential (primary) hypertension: Secondary | ICD-10-CM | POA: Diagnosis not present

## 2019-05-13 DIAGNOSIS — E118 Type 2 diabetes mellitus with unspecified complications: Secondary | ICD-10-CM | POA: Diagnosis not present

## 2019-05-13 DIAGNOSIS — E538 Deficiency of other specified B group vitamins: Secondary | ICD-10-CM | POA: Diagnosis not present

## 2019-05-13 DIAGNOSIS — D696 Thrombocytopenia, unspecified: Secondary | ICD-10-CM | POA: Diagnosis not present

## 2019-05-13 DIAGNOSIS — Z7984 Long term (current) use of oral hypoglycemic drugs: Secondary | ICD-10-CM | POA: Diagnosis not present

## 2019-05-13 DIAGNOSIS — E782 Mixed hyperlipidemia: Secondary | ICD-10-CM | POA: Diagnosis not present

## 2019-05-29 DIAGNOSIS — E1149 Type 2 diabetes mellitus with other diabetic neurological complication: Secondary | ICD-10-CM | POA: Diagnosis not present

## 2019-05-29 DIAGNOSIS — L03031 Cellulitis of right toe: Secondary | ICD-10-CM | POA: Diagnosis not present

## 2019-05-29 DIAGNOSIS — L600 Ingrowing nail: Secondary | ICD-10-CM | POA: Diagnosis not present

## 2019-05-29 DIAGNOSIS — L84 Corns and callosities: Secondary | ICD-10-CM | POA: Diagnosis not present

## 2019-06-11 ENCOUNTER — Ambulatory Visit: Payer: Medicare Other | Admitting: Allergy & Immunology

## 2019-06-13 DIAGNOSIS — B369 Superficial mycosis, unspecified: Secondary | ICD-10-CM | POA: Diagnosis not present

## 2019-06-16 DIAGNOSIS — E538 Deficiency of other specified B group vitamins: Secondary | ICD-10-CM | POA: Diagnosis not present

## 2019-06-16 DIAGNOSIS — R944 Abnormal results of kidney function studies: Secondary | ICD-10-CM | POA: Diagnosis not present

## 2019-06-16 DIAGNOSIS — D696 Thrombocytopenia, unspecified: Secondary | ICD-10-CM | POA: Diagnosis not present

## 2019-06-18 DIAGNOSIS — M19011 Primary osteoarthritis, right shoulder: Secondary | ICD-10-CM | POA: Diagnosis not present

## 2019-06-18 DIAGNOSIS — M5030 Other cervical disc degeneration, unspecified cervical region: Secondary | ICD-10-CM | POA: Diagnosis not present

## 2019-06-18 DIAGNOSIS — M19012 Primary osteoarthritis, left shoulder: Secondary | ICD-10-CM | POA: Diagnosis not present

## 2019-07-02 DIAGNOSIS — L219 Seborrheic dermatitis, unspecified: Secondary | ICD-10-CM | POA: Diagnosis not present

## 2019-07-04 DIAGNOSIS — M5030 Other cervical disc degeneration, unspecified cervical region: Secondary | ICD-10-CM | POA: Diagnosis not present

## 2019-07-04 DIAGNOSIS — M19012 Primary osteoarthritis, left shoulder: Secondary | ICD-10-CM | POA: Diagnosis not present

## 2019-07-04 DIAGNOSIS — M19011 Primary osteoarthritis, right shoulder: Secondary | ICD-10-CM | POA: Diagnosis not present

## 2019-07-09 DIAGNOSIS — M19012 Primary osteoarthritis, left shoulder: Secondary | ICD-10-CM | POA: Diagnosis not present

## 2019-07-09 DIAGNOSIS — M19011 Primary osteoarthritis, right shoulder: Secondary | ICD-10-CM | POA: Diagnosis not present

## 2019-07-09 DIAGNOSIS — M5030 Other cervical disc degeneration, unspecified cervical region: Secondary | ICD-10-CM | POA: Diagnosis not present

## 2019-07-11 ENCOUNTER — Ambulatory Visit: Payer: Medicare Other | Admitting: Allergy & Immunology

## 2019-07-16 DIAGNOSIS — M5030 Other cervical disc degeneration, unspecified cervical region: Secondary | ICD-10-CM | POA: Diagnosis not present

## 2019-07-16 DIAGNOSIS — M19012 Primary osteoarthritis, left shoulder: Secondary | ICD-10-CM | POA: Diagnosis not present

## 2019-07-16 DIAGNOSIS — M19011 Primary osteoarthritis, right shoulder: Secondary | ICD-10-CM | POA: Diagnosis not present

## 2019-07-23 DIAGNOSIS — M19012 Primary osteoarthritis, left shoulder: Secondary | ICD-10-CM | POA: Diagnosis not present

## 2019-07-23 DIAGNOSIS — M5030 Other cervical disc degeneration, unspecified cervical region: Secondary | ICD-10-CM | POA: Diagnosis not present

## 2019-07-23 DIAGNOSIS — M19011 Primary osteoarthritis, right shoulder: Secondary | ICD-10-CM | POA: Diagnosis not present

## 2019-07-25 ENCOUNTER — Other Ambulatory Visit: Payer: Self-pay | Admitting: Allergy & Immunology

## 2019-07-25 DIAGNOSIS — I10 Essential (primary) hypertension: Secondary | ICD-10-CM

## 2019-07-28 ENCOUNTER — Telehealth: Payer: Self-pay | Admitting: Allergy & Immunology

## 2019-07-28 DIAGNOSIS — M5030 Other cervical disc degeneration, unspecified cervical region: Secondary | ICD-10-CM | POA: Diagnosis not present

## 2019-07-28 DIAGNOSIS — M19012 Primary osteoarthritis, left shoulder: Secondary | ICD-10-CM | POA: Diagnosis not present

## 2019-07-28 DIAGNOSIS — M19011 Primary osteoarthritis, right shoulder: Secondary | ICD-10-CM | POA: Diagnosis not present

## 2019-07-28 MED ORDER — CEFDINIR 300 MG PO CAPS: 300.0000 mg | ORAL_CAPSULE | Freq: Two times a day (BID) | ORAL | 0 refills | Status: AC

## 2019-07-28 NOTE — Telephone Encounter (Signed)
Patient called me and immediately started yelling over the phone about all of her symptoms.  Per usual, it is very difficult to understand her over the phone.  From what I can gather, she has had 36 to 48 hours of congestion and sinus pain as well as coughing.  She is coughing to the point of vomiting.  Over the past 36 hours, she has tried eating sips of soup, but continues to have posttussive vomiting.  She has had no fever, loss of smell, known sick contacts, or other concerns.  She does report normal urination.  Despite this, she was able to make it to physical therapy today and apparently got her hands on some antinausea medication.  It is unclear what this medication is.  She keeps telling me that it is her "sinuses" and repeats this about 10 times during the conversation.  She does have an appointment with me on Wednesday this week in [place].  I did tell her I could certainly send in an antibiotic but I was very worried about dehydration.  She tells me that she has been drinking liquids and has been eating soup.  She thinks that the antinausea medicine is helping somewhat, but she needs something to help with the congestion.  I sent in cefdinir twice daily for 10 days.  Confirm the pharmacy with her 3 different times.  Salvatore Marvel, MD [facility]

## 2019-07-29 DIAGNOSIS — R251 Tremor, unspecified: Secondary | ICD-10-CM | POA: Diagnosis not present

## 2019-07-29 DIAGNOSIS — R269 Unspecified abnormalities of gait and mobility: Secondary | ICD-10-CM | POA: Diagnosis not present

## 2019-07-29 NOTE — Telephone Encounter (Signed)
Tried calling- busy tone no answer.

## 2019-07-29 NOTE — Telephone Encounter (Signed)
Called and spoke with patients who stated Barbara House was at another doctors appointment.  Per son, she was feeling a little better and was able to eat some.  Left message with son to let Barbara House know the office had called to check on her.

## 2019-07-30 ENCOUNTER — Ambulatory Visit (INDEPENDENT_AMBULATORY_CARE_PROVIDER_SITE_OTHER): Payer: Medicare Other | Admitting: Allergy & Immunology

## 2019-07-30 ENCOUNTER — Encounter (HOSPITAL_COMMUNITY): Payer: Self-pay | Admitting: Emergency Medicine

## 2019-07-30 ENCOUNTER — Emergency Department (HOSPITAL_COMMUNITY): Payer: Medicare Other

## 2019-07-30 ENCOUNTER — Encounter: Payer: Self-pay | Admitting: Allergy & Immunology

## 2019-07-30 ENCOUNTER — Observation Stay (HOSPITAL_COMMUNITY)
Admission: EM | Admit: 2019-07-30 | Discharge: 2019-08-01 | Disposition: A | Discharge status: Home / Self Care | Payer: Medicare Other | Attending: Internal Medicine | Admitting: Internal Medicine

## 2019-07-30 ENCOUNTER — Other Ambulatory Visit: Payer: Self-pay

## 2019-07-30 VITALS — BP 94/62 | HR 88 | Temp 98.0°F | Resp 18

## 2019-07-30 DIAGNOSIS — N179 Acute kidney failure, unspecified: Secondary | ICD-10-CM | POA: Diagnosis not present

## 2019-07-30 DIAGNOSIS — R112 Nausea with vomiting, unspecified: Secondary | ICD-10-CM | POA: Insufficient documentation

## 2019-07-30 DIAGNOSIS — H538 Other visual disturbances: Secondary | ICD-10-CM | POA: Diagnosis not present

## 2019-07-30 DIAGNOSIS — Z888 Allergy status to other drugs, medicaments and biological substances status: Secondary | ICD-10-CM | POA: Diagnosis not present

## 2019-07-30 DIAGNOSIS — R519 Headache, unspecified: Secondary | ICD-10-CM | POA: Diagnosis not present

## 2019-07-30 DIAGNOSIS — Z7984 Long term (current) use of oral hypoglycemic drugs: Secondary | ICD-10-CM | POA: Insufficient documentation

## 2019-07-30 DIAGNOSIS — J0101 Acute recurrent maxillary sinusitis: Secondary | ICD-10-CM

## 2019-07-30 DIAGNOSIS — E1165 Type 2 diabetes mellitus with hyperglycemia: Secondary | ICD-10-CM | POA: Diagnosis not present

## 2019-07-30 DIAGNOSIS — I959 Hypotension, unspecified: Secondary | ICD-10-CM | POA: Diagnosis not present

## 2019-07-30 DIAGNOSIS — R5381 Other malaise: Secondary | ICD-10-CM | POA: Diagnosis not present

## 2019-07-30 DIAGNOSIS — M19012 Primary osteoarthritis, left shoulder: Secondary | ICD-10-CM | POA: Diagnosis not present

## 2019-07-30 DIAGNOSIS — R531 Weakness: Secondary | ICD-10-CM | POA: Diagnosis not present

## 2019-07-30 DIAGNOSIS — I10 Essential (primary) hypertension: Secondary | ICD-10-CM | POA: Diagnosis not present

## 2019-07-30 DIAGNOSIS — R490 Dysphonia: Secondary | ICD-10-CM

## 2019-07-30 DIAGNOSIS — Z7982 Long term (current) use of aspirin: Secondary | ICD-10-CM | POA: Insufficient documentation

## 2019-07-30 DIAGNOSIS — E119 Type 2 diabetes mellitus without complications: Secondary | ICD-10-CM | POA: Diagnosis not present

## 2019-07-30 DIAGNOSIS — R42 Dizziness and giddiness: Secondary | ICD-10-CM | POA: Diagnosis not present

## 2019-07-30 DIAGNOSIS — E860 Dehydration: Secondary | ICD-10-CM | POA: Diagnosis not present

## 2019-07-30 DIAGNOSIS — M5030 Other cervical disc degeneration, unspecified cervical region: Secondary | ICD-10-CM | POA: Diagnosis not present

## 2019-07-30 DIAGNOSIS — M19011 Primary osteoarthritis, right shoulder: Secondary | ICD-10-CM | POA: Diagnosis not present

## 2019-07-30 DIAGNOSIS — Z20828 Contact with and (suspected) exposure to other viral communicable diseases: Secondary | ICD-10-CM | POA: Diagnosis not present

## 2019-07-30 DIAGNOSIS — Z79899 Other long term (current) drug therapy: Secondary | ICD-10-CM | POA: Diagnosis not present

## 2019-07-30 DIAGNOSIS — Z9861 Coronary angioplasty status: Secondary | ICD-10-CM | POA: Insufficient documentation

## 2019-07-30 DIAGNOSIS — I951 Orthostatic hypotension: Secondary | ICD-10-CM | POA: Diagnosis not present

## 2019-07-30 DIAGNOSIS — J3089 Other allergic rhinitis: Secondary | ICD-10-CM

## 2019-07-30 LAB — BASIC METABOLIC PANEL
Anion gap: 13 (ref 5–15)
BUN: 59 mg/dL — ABNORMAL HIGH (ref 8–23)
CO2: 23 mmol/L (ref 22–32)
Calcium: 10.1 mg/dL (ref 8.9–10.3)
Chloride: 102 mmol/L (ref 98–111)
Creatinine, Ser: 3.61 mg/dL — ABNORMAL HIGH (ref 0.44–1.00)
GFR calc Af Amer: 15 mL/min — ABNORMAL LOW (ref >60)
GFR calc non Af Amer: 13 mL/min — ABNORMAL LOW (ref >60)
Glucose, Bld: 175 mg/dL — ABNORMAL HIGH (ref 70–99)
Potassium: 4.1 mmol/L (ref 3.5–5.1)
Sodium: 138 mmol/L (ref 135–145)

## 2019-07-30 LAB — CBC WITH DIFFERENTIAL/PLATELET
Abs Immature Granulocytes: 0.03 10*3/uL (ref 0.00–0.07)
Basophils Absolute: 0.1 10*3/uL (ref 0.0–0.1)
Basophils Relative: 1 %
Eosinophils Absolute: 0.6 10*3/uL — ABNORMAL HIGH (ref 0.0–0.5)
Eosinophils Relative: 7 %
HCT: 43.2 % (ref 36.0–46.0)
Hemoglobin: 13.2 g/dL (ref 12.0–15.0)
Immature Granulocytes: 0 %
Lymphocytes Relative: 26 %
Lymphs Abs: 2.1 10*3/uL (ref 0.7–4.0)
MCH: 27.4 pg (ref 26.0–34.0)
MCHC: 30.6 g/dL (ref 30.0–36.0)
MCV: 89.6 fL (ref 80.0–100.0)
Monocytes Absolute: 0.7 10*3/uL (ref 0.1–1.0)
Monocytes Relative: 9 %
Neutro Abs: 4.4 10*3/uL (ref 1.7–7.7)
Neutrophils Relative %: 57 %
Platelets: 97 10*3/uL — ABNORMAL LOW (ref 150–400)
RBC: 4.82 MIL/uL (ref 3.87–5.11)
RDW: 12.4 % (ref 11.5–15.5)
WBC: 7.8 10*3/uL (ref 4.0–10.5)
nRBC: 0 % (ref 0.0–0.2)

## 2019-07-30 LAB — CBG MONITORING, ED: Glucose-Capillary: 157 mg/dL — ABNORMAL HIGH (ref 70–99)

## 2019-07-30 MED ORDER — SODIUM CHLORIDE 0.9 % IV SOLN: 1000.0000 mL | INTRAVENOUS | Status: DC

## 2019-07-30 MED ORDER — MECLIZINE HCL 12.5 MG PO TABS
25.0000 mg | ORAL_TABLET | Freq: Once | ORAL | Status: AC
  Administered 2019-07-30: 25 mg via ORAL
  Filled 2019-07-30: qty 2

## 2019-07-30 MED ORDER — SODIUM CHLORIDE 0.9% FLUSH: 3.0000 mL | Freq: Once | INTRAVENOUS | Status: DC

## 2019-07-30 MED ORDER — SODIUM CHLORIDE 0.9 % IV BOLUS (SEPSIS)
500.0000 mL | Freq: Once | INTRAVENOUS | Status: AC
  Administered 2019-07-31: 500 mL via INTRAVENOUS

## 2019-07-30 NOTE — ED Triage Notes (Signed)
Patient brought in by EMS from allergy doctor for complaint of dizziness and blurry vision while taking her vitals. Patient states she had vomiting Sunday and Monday and had episode of blurry vision and dizziness yesterday on her way to [facility]. Also complains of left sided headache.

## 2019-07-30 NOTE — Progress Notes (Signed)
FOLLOW UP  Date of Service/Encounter:  07/30/19   Assessment:   Hypotension and dehydration - ? sepsis  Perennial allergic rhinitis   Chronic rhinosinusitis - Augmentin called in on 07/28/19  Gastroesophageal reflux disease- controlled on Dexliant and ranitidine  Recurrent infections - needs Tdap and repeat Diphtheria and Tetanus titers 4-6 weeks afterwards   [name] presents today for a sick visit.  Letter to her couple days ago and sent in Augmentin.  I was worried even that about dehydration since she reported that she had been vomiting for a few days.  However, she said she was keeping some fluids down.  She was reporting some sinus pressure and nasal drainage so I sent in Augmentin.  I also made sure that she came in today for an evaluation.  Today, she does have delayed capillary refill as well as hypotension and abnormal orthostatic blood pressures.  She is experiencing dizziness as well as some visual changes bilaterally.  Because of this, I would like her evaluated in the emergency room.  We did get a hold of her son to let him know that she was going to be at [facility].  Plan/Recommendations:   1. Perennial allergic rhinitis with hoarseness - Consider taking with recommend that you use nasal sprays on a DAILY basis: nasal saline rinses twice daily + Flonase two sprays per nostril daily + Patanase two sprays per nostril 2 times daily  - Continue with montelukast 10mg  daily. - Continue with Xyzal 5mg  daily.  - These medications need to be used EVERY DAY for the best effect.   2. GERD (reflux) - Continue with Dexilant 30mg  daily and Ranitidine 150mg  2 times a day.  3. Acute sinusitis - Start the antibiotic I called in earlier this week: Augmentin 875mg  twice daily for 7 days - Add on nasal saline spray (i.e., Simply Saline) or nasal saline lavage (i.e., NeilMed) as needed prior to medicated nasal sprays. - For thick post nasal drainage, add guaifenesin  [phone] mg (Mucinex) twice daily as needed for mucous thinning with adequate hydration to help it work.   4. Dehydration with hypotension (low blood pressure) and dizziness - I think that you need to go to the ED for an evaluation and possible IV fluids.  - You might need an EKG because of the dizziness that you are experiencing.   5. Return in about 6 months (around 01/28/2020).  Subjective:   Barbara House is a 63 y.o. female presenting today for follow up of  Chief Complaint  Patient presents with   Cough    no more vomiting. cough is a lot better. just off and on now. she says that she has to keep something to drink close by because her throat gets very dry.     Barbara House has a history of the following: Patient Active Problem List   Diagnosis Date Noted   Gastritis without peptic ulcer disease    Esophageal dysphagia 08/09/2018   Encounter for screening colonoscopy 08/09/2018   Hyperlipidemia LDL goal <70 10/12/2017   Anxiety disorder    Type 2 diabetes mellitus ([place])    Hypertension    Allergic rhinitis    Angina pectoris ([place]) 08/03/2016   Abnormal nuclear stress test 08/03/2016   Abnormal myocardial perfusion study    Rhinitis 06/25/2015   GERD (gastroesophageal reflux disease) 06/25/2015   Diabetes mellitus ([place]) 06/25/2015    History obtained from: chart review and patient.  Barbara House is a 63  y.o. female presenting for a sick visit. Barbara House is a 63 year old who presents for follow up of chronic cough and hoarseness.  When I last saw her in February 2020, we recommended using the nasal sprays on a daily basis because her hoarseness had become worse.  We continue the montelukast and the Xyzal.  I emphasized the need to use this on a daily basis for the best effect.  I also recommended that she make an appointment with her otolaryngologist again.  For her reflux, we kept her on maximized therapy with Dexilant30 mg and ranitidine twice daily.  We  did treat her with Augmentin because she had sinus drainage for 10 days.  She called the office on 07/28/19 complaining of emesis that she thought was secondary to sinus congestion. She states she has been vomiting since then but unsure of how many times per day. During vital sign check for today's visit it was noted she was hypotensive with sBP in the 80s-90s (checked on 2 separate occasions). She also reports dizziness at rest and when standing. Denies infectious symptoms and syncopal events.  She brought paperwork from her neurology visit yesterday that shows her BP was 86/43. She was advised to hold her BP medications and called her PCP. She has not taken her BP meds for the past 2 days, but has not called her PCP for an appt. On exam, she has dry mucus membranes and decreased skin turgor consistent with hypovolemia. Orthostatic vital signs: 92/38 (supine) -> 112/68 (sitting) -> 98/68 (standing). She was dizzy when sitting and standing up.   In addition to the low blood pressure, she has had some visual changes during her dry eyes over the past couple of days.  She does report dizziness as well.  She has been keeping some V8 splash fruit juice down as well as some soups.  She has had no fever and she denies any COVID-19 exposures.  Apparently she talked to someone about her blood pressure and she was told to hold her blood pressure medication today.  With regards to her hoarseness, this continues to be an issue.  It tends to only flare when she has her "sinuses acting up".  She does report using her nasal sprays but is not using them daily as recommended at the last visit.  She is using her pills on a daily basis.  Her reflux has not changed in intensity.   She denies any new medication changes, but she has been on 2 or 3 different diabetic medications. 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   Otherwise, there have been no changes to her past medical history, surgical history, family  history, or social history.    Review of Systems  Constitutional: Negative.  Negative for chills, diaphoresis, fever, malaise/fatigue and weight loss.  HENT: Positive for congestion and sinus pain. Negative for ear discharge, ear pain and sore throat.   Eyes: Positive for blurred vision. Negative for double vision, pain, discharge and redness.  Respiratory: Negative for cough, sputum production, shortness of breath and wheezing.   Cardiovascular: Negative.  Negative for chest pain and palpitations.  Gastrointestinal: Positive for nausea and vomiting. Negative for abdominal pain and heartburn.  Skin: Negative.  Negative for itching and rash.  Neurological: Positive for tremors and focal weakness. Negative for dizziness, sensory change and headaches.  Endo/Heme/Allergies: Negative for environmental allergies. Does not bruise/bleed easily.       Objective:   Blood pressure 94/62, pulse 88, temperature 98  F (36.7 C), temperature source Temporal, resp. rate 18, SpO2 97 %. There is no height or weight on file to calculate BMI.   Orthostatic vital signs: 92/38 (supine) -> 112/68 (sitting) -> 98/68 (standing)   Physical Exam:  Physical Exam  Constitutional: She appears well-developed.  Yelling. Dizzy.   HENT:  Head: Normocephalic and atraumatic.  Right Ear: Tympanic membrane, external ear and ear canal normal.  Left Ear: Tympanic membrane, external ear and ear canal normal.  Nose: Rhinorrhea present. No mucosal edema, nasal deformity or septal deviation. No epistaxis. Right sinus exhibits no maxillary sinus tenderness and no frontal sinus tenderness. Left sinus exhibits no maxillary sinus tenderness and no frontal sinus tenderness.  Mouth/Throat: Uvula is midline and oropharynx is clear and moist. Mucous membranes are not pale and not dry.  She does have some cobblestoning present in the posterior oropharynx.   Eyes: Pupils are equal, round, and reactive to light. Conjunctivae and EOM  are normal. Right eye exhibits no chemosis and no discharge. Left eye exhibits no chemosis and no discharge. Right conjunctiva is not injected. Left conjunctiva is not injected.  Cardiovascular: Normal rate and normal heart sounds. An irregularly irregular rhythm present.  No murmur heard. Delayed capillary refill of 3 to 5 seconds.   Respiratory: Effort normal and breath sounds normal. No accessory muscle usage. No tachypnea. No respiratory distress. She has no wheezes. She has no rhonchi. She has no rales. She exhibits no tenderness.  Decreased air movement at the bases.   Lymphadenopathy:    She has no cervical adenopathy.  Neurological: She is alert.  Very shaky gait.   Skin: No abrasion, no petechiae and no rash noted. Rash is not papular, not vesicular and not urticarial. No erythema. No pallor.  No skin findings.   Psychiatric: She has a normal mood and affect.     Diagnostic studies: none     Salvatore Marvel, MD  [facility]

## 2019-07-30 NOTE — ED Provider Notes (Signed)
[facility] EMERGENCY DEPARTMENT Provider Note   CSN: [id number] Arrival date & time: 07/30/19  1716     History   Chief Complaint Chief Complaint  Patient presents with   Dizziness    HPI ROSHANDA House is a 63 y.o. female.     Patient is a 63 year old female who presents to the emergency department by EMS because of dizziness.  The patient states that she was in her usual state of health until earlier today while she was riding, she noticed dizziness.  This was accompanied by blurred vision.  She says at times her vision was so blurry that she could not make out colors.  She states that at times depending on certain positions she feels as though she is spinning, at other times she feels as though she is off balance and falling.  The dizziness was accompanied by headache on the left that went down the back of her neck.  The patient states she is also had problems with being more hoarse than usual.  She was seen today by allergy specialist who noted that she was having an exacerbation of her chronic sinus infection accompanied by hoarseness.  The allergy specialist however was still concerned about the dizziness that he made arrangements for her to be brought to the emergency department by EMS.  The patient states that about 3 days ago she had problems with vomiting.  She has had vomiting over the past 2 days. No vomiting today. Pt taking in a limited amount of liquids. she also c/o  some left-sided headache at that time.  She presents now for evaluation of the headache and the vision changes.  The history is provided by the patient.    Past Medical History:  Diagnosis Date   Allergic rhinitis    Anxiety disorder    Arthritis    GERD (gastroesophageal reflux disease)    Headache    History of migraine    Hypertension    Type 2 diabetes mellitus ([place])    Not sure when she was diagnosed, greater than five years.     Patient Active Problem List   Diagnosis Date Noted    Gastritis without peptic ulcer disease    Esophageal dysphagia 08/09/2018   Encounter for screening colonoscopy 08/09/2018   Hyperlipidemia LDL goal <70 10/12/2017   Anxiety disorder    Type 2 diabetes mellitus ([place])    Hypertension    Allergic rhinitis    Angina pectoris ([place]) 08/03/2016   Abnormal nuclear stress test 08/03/2016   Abnormal myocardial perfusion study    Rhinitis 06/25/2015   GERD (gastroesophageal reflux disease) 06/25/2015   Diabetes mellitus ([place]) 06/25/2015    Past Surgical History:  Procedure Laterality Date   ABDOMINAL HYSTERECTOMY     has ovaries.    CARDIAC CATHETERIZATION N/A 08/03/2016   Procedure: Left Heart Cath and Coronary Angiography;  Surgeon: Peter M Martinique, MD;  Location: [place] [facility];  Service: Cardiovascular;  Laterality: N/A;   COLONOSCOPY WITH PROPOFOL N/A 11/05/2018   Dr. Oneida Alar: int/ext hemorrhoids, 69mm cecal tubular adenoma removed, next TCS in 5-10 years   ESOPHAGOGASTRODUODENOSCOPY (EGD) WITH PROPOFOL N/A 11/05/2018   Dr. Oneida Alar: esophagus was dilated, mild gastritis with benign bx   Miinor skin surgery     POLYPECTOMY  11/05/2018   Procedure: POLYPECTOMY;  Surgeon: Danie Binder, MD;  [facility];  Service: Endoscopy;;  cecal polyp   SAVORY DILATION N/A 11/05/2018   Procedure: SAVORY DILATION;  Surgeon:  Fields, Marga Melnick, MD;  Location: [facility];  Service: Endoscopy;  Laterality: N/A;     OB History   No obstetric history on file.      Home Medications    Prior to Admission medications   Medication Sig Start Date End Date Taking? Authorizing Provider  aspirin EC 81 MG tablet Take 81 mg by mouth daily.    [provider]  atorvastatin (LIPITOR) 40 MG tablet Take 0.5 tablets (20 mg total) by mouth daily. 03/27/18   Caren Macadam, MD  Azelastine HCl 0.15 % SOLN Place 1 spray into the nose at bedtime as needed. Patient taking differently: Place 1 spray into the nose at bedtime as  needed (congestion).  08/18/15   Gean Quint, MD  cefdinir (OMNICEF) 300 MG capsule Take 1 capsule (300 mg total) by mouth 2 (two) times daily for 10 days. 07/28/19 08/07/19  Valentina Shaggy, MD  cycloSPORINE (RESTASIS) 0.05 % ophthalmic emulsion Place 1 drop into both eyes 2 (two) times daily as needed (for eye dryness.).    [provider]  dexlansoprazole (DEXILANT) 60 MG capsule Take 1 capsule (60 mg total) by mouth daily before breakfast. 11/20/18   Annitta Needs, NP  fluticasone [facility]) 50 MCG/ACT nasal spray Place 1-2 sprays into both nostrils daily as needed (for sinus issues.). 09/14/16   Valentina Shaggy, MD  gabapentin (NEURONTIN) 100 MG capsule TAKE ONE CAPSULE BY MOUTH TWICE DAILY. Patient taking differently: as needed.  06/24/18   Caren Macadam, MD  glipiZIDE (GLUCOTROL XL) 10 MG 24 hr tablet TAKE ONE TABLET BY MOUTH DAILY. TAKE WITH BREAKFAST. Patient taking differently: Take 10 mg by mouth daily with breakfast.  06/24/18   Caren Macadam, MD  levocetirizine (XYZAL) 5 MG tablet Take 1 tablet (5 mg total) by mouth every evening. Patient taking differently: Take 5 mg by mouth as needed.  01/02/17   Valentina Shaggy, MD  linagliptin (TRADJENTA) 5 MG TABS tablet Take 1 tablet (5 mg total) by mouth daily. 03/14/18   Caren Macadam, MD  lisinopril (PRINIVIL,ZESTRIL) 40 MG tablet TAKE ONE TABLET BY MOUTH DAILY. Patient taking differently: Take 40 mg by mouth daily.  06/24/18   Caren Macadam, MD  metFORMIN (GLUCOPHAGE) 1000 MG tablet TAKE ONE TABLET BY MOUTH TWICE DAILY Patient taking differently: Take 1,000 mg by mouth 2 (two) times daily with a meal.  06/24/18   Caren Macadam, MD  montelukast (SINGULAIR) 10 MG tablet Take 1 tablet (10 mg total) by mouth at bedtime as needed (for allergies.). 03/31/19   Kennith Gain, MD  nabumetone (RELAFEN) 750 MG tablet Take 750 mg by mouth as needed (pain).  11/03/16   [provider]  PARoxetine (PAXIL) 40 MG  tablet TAKE ONE TABLET BY MOUTH EVERY MORNING. Patient taking differently: Take 40 mg by mouth daily.  01/01/18   Caren Macadam, MD  triamterene-hydrochlorothiazide (MAXZIDE-25) 37.5-25 MG tablet Take 1 tablet by mouth daily. 03/08/18   Caren Macadam, MD  UNABLE TO FIND Diabetic shoe 08/08/17   Caren Macadam, MD  verapamil (CALAN-SR) 240 MG CR tablet TAKE ONE TABLET BY MOUTH EVERY EVENING. Patient taking differently: Take 240 mg by mouth daily after breakfast.  06/24/18   Caren Macadam, MD    Family History Family History  Problem Relation Age of Onset   Hypertension Mother    Diabetes Mother    Hypertension Son    Diabetes Son    Cancer Father    Cirrhosis Brother  Alcohol abuse Brother    Colon cancer Neg Hx     Social History Social History   Tobacco Use   Smoking status: Never Smoker   Smokeless tobacco: Never Used  Substance Use Topics   Alcohol use: No    Alcohol/week: 0.0 standard drinks   Drug use: No     Allergies   Prednisone   Review of Systems Review of Systems  Constitutional: Negative for activity change and appetite change.  HENT: Positive for congestion and sinus pressure. Negative for ear discharge, ear pain, facial swelling, nosebleeds, rhinorrhea, sneezing and tinnitus.   Eyes: Positive for visual disturbance. Negative for photophobia, pain and discharge.  Respiratory: Negative for cough, choking, shortness of breath and wheezing.   Cardiovascular: Negative for chest pain, palpitations and leg swelling.  Gastrointestinal: Positive for nausea and vomiting. Negative for abdominal pain, blood in stool, constipation and diarrhea.  Genitourinary: Negative for difficulty urinating, dysuria, flank pain, frequency and hematuria.  Musculoskeletal: Negative for back pain, gait problem, myalgias and neck pain.  Skin: Negative for color change, rash and wound.  Neurological: Positive for dizziness and headaches. Negative for seizures, syncope,  facial asymmetry, speech difficulty, weakness and numbness.  Hematological: Negative for adenopathy. Does not bruise/bleed easily.  Psychiatric/Behavioral: Negative for agitation, confusion, hallucinations, self-injury and suicidal ideas. The patient is not nervous/anxious.      Physical Exam Updated Vital Signs BP 101/74 (BP Location: Left Arm)    Pulse 92    Temp 98.1 F (36.7 C) (Oral)    Resp 18    Ht 5\' 1"  (1.549 m)    Wt 79.4 kg    SpO2 100%    BMI 33.07 kg/m   Physical Exam Vitals signs and nursing note reviewed.  Constitutional:      General: She is not in acute distress.    Appearance: She is well-developed.  HENT:     Head: Normocephalic and atraumatic.     Right Ear: External ear normal.     Left Ear: External ear normal.  Eyes:     General: No scleral icterus.       Right eye: No discharge.        Left eye: No discharge.     Conjunctiva/sclera: Conjunctivae normal.  Neck:     Musculoskeletal: Neck supple. No muscular tenderness.     Vascular: No carotid bruit.     Trachea: No tracheal deviation.  Cardiovascular:     Rate and Rhythm: Normal rate and regular rhythm.  Pulmonary:     Effort: Pulmonary effort is normal. No respiratory distress.     Breath sounds: Normal breath sounds. No stridor. No wheezing or rales.  Abdominal:     General: Bowel sounds are normal. There is no distension.     Palpations: Abdomen is soft.     Tenderness: There is no abdominal tenderness. There is no guarding or rebound.  Musculoskeletal:        General: No tenderness.  Skin:    General: Skin is warm and dry.     Capillary Refill: Capillary refill takes less than 2 seconds.     Findings: No rash.  Neurological:     General: No focal deficit present.     Mental Status: She is alert.     Cranial Nerves: No cranial nerve deficit (no facial droop, extraocular movements intact, no slurred speech).     Sensory: No sensory deficit.     Motor: No weakness, abnormal muscle tone or  seizure  activity.     Coordination: Coordination normal.      ED Treatments / Results  Labs (all labs ordered are listed, but only abnormal results are displayed) Labs Reviewed  BASIC METABOLIC PANEL  URINALYSIS, ROUTINE W REFLEX MICROSCOPIC  CBC WITH DIFFERENTIAL/PLATELET  CBG MONITORING, ED    EKG None  Radiology No results found.  Procedures Procedures (including critical care time)  Medications Ordered in ED Medications  sodium chloride flush (NS) 0.9 % injection 3 mL (3 mLs Intravenous Not Given 07/30/19 1957)     Initial Impression / Assessment and Plan / ED Course  I have reviewed the triage vital signs and the nursing notes.  Pertinent labs & imaging results that were available during my care of the patient were reviewed by me and considered in my medical decision making (see chart for details).          Final Clinical Impressions(s) / ED Diagnoses  Vital signs are within normal limits.  Pulse oximetry is 100% on room air.  Within normal limits by my interpretation.  Capillary blood glucose is 157.  Patient has at times a sensation of spinning, and at times a sensation of being off balance and falling when she changes positions.  She says this is accompanied by headache.  CT scan of the head is negative for acute problems.  Complete blood count shows the platelets to be low at 97, otherwise the complete blood count is within normal limits.  The basic metabolic panel shows the BUN to be elevated at 59, the creatinine elevated at 3.61.  This is higher than the patient's previous studies.  Pt reviewed by Dr Roderic Palau.  Will discuss admission with Hospitalist.  Dx AKI, Dehydration, Dizziness.   Final diagnoses:  Acute kidney injury [facility])  Dehydration  Dizziness    ED Discharge Orders    None       Lily Kocher, PA-C 07/31/19 1609    Milton Ferguson, MD 08/02/19 1026

## 2019-07-30 NOTE — ED Notes (Addendum)
Pt ambulatory from bathroom back to room. Pt states she is not having blurry vision anymore.

## 2019-07-30 NOTE — Patient Instructions (Addendum)
1. Perennial allergic rhinitis with hoarseness - Consider taking with recommend that you use nasal sprays on a DAILY basis: nasal saline rinses twice daily + Flonase two sprays per nostril daily + Patanase two sprays per nostril 2 times daily  - Continue with montelukast 10mg  daily. - Continue with Xyzal 5mg  daily.  - These medications need to be used EVERY DAY for the best effect.   2. GERD (reflux) - Continue with Dexilant 30mg  daily and Ranitidine 150mg  2 times a day.  3. Acute sinusitis - Start the antibiotic I called in earlier this week: Augmentin 875mg  twice daily for 7 days - Add on nasal saline spray (i.e., Simply Saline) or nasal saline lavage (i.e., NeilMed) as needed prior to medicated nasal sprays. - For thick post nasal drainage, add guaifenesin [phone] mg (Mucinex) twice daily as needed for mucous thinning with adequate hydration to help it work.   4. Dehydration with hypotension (low blood pressure) and dizziness - I think that you need to go to the ED for an evaluation and possible IV fluids.  - You might need an EKG because of the dizziness that you are experiencing.   5. Return in about 6 months (around 01/28/2020).    Please inform us of any Emergency Department visits, hospitalizations, or changes in symptoms. Call us before going to the ED for breathing or allergy symptoms since we might be able to fit you in for a sick visit. Feel free to contact us anytime with any questions, problems, or concerns.  It was a pleasure to see you again today!  Websites that have reliable patient information: 1. American Academy of Asthma, Allergy, and Immunology: www.aaaai.org 2. Food Allergy Research and Education (FARE): foodallergy.org 3. Mothers of Asthmatics: http://www.asthmacommunitynetwork.org 4. American College of Allergy, Asthma, and Immunology: MonthlyElectricBill.co.uk   Make sure you are registered to vote! If you have moved or changed any of your contact information, you will  need to get this updated before voting!

## 2019-07-31 ENCOUNTER — Other Ambulatory Visit: Payer: Self-pay

## 2019-07-31 ENCOUNTER — Encounter (HOSPITAL_COMMUNITY): Payer: Self-pay

## 2019-07-31 ENCOUNTER — Observation Stay (HOSPITAL_COMMUNITY): Payer: Medicare Other

## 2019-07-31 DIAGNOSIS — N281 Cyst of kidney, acquired: Secondary | ICD-10-CM | POA: Diagnosis not present

## 2019-07-31 DIAGNOSIS — E860 Dehydration: Secondary | ICD-10-CM | POA: Diagnosis not present

## 2019-07-31 DIAGNOSIS — N179 Acute kidney failure, unspecified: Secondary | ICD-10-CM | POA: Diagnosis not present

## 2019-07-31 DIAGNOSIS — R42 Dizziness and giddiness: Secondary | ICD-10-CM | POA: Diagnosis not present

## 2019-07-31 LAB — CBC
HCT: 39 % (ref 36.0–46.0)
Hemoglobin: 12 g/dL (ref 12.0–15.0)
MCH: 27.6 pg (ref 26.0–34.0)
MCHC: 30.8 g/dL (ref 30.0–36.0)
MCV: 89.7 fL (ref 80.0–100.0)
Platelets: 97 10*3/uL — ABNORMAL LOW (ref 150–400)
RBC: 4.35 MIL/uL (ref 3.87–5.11)
RDW: 12.4 % (ref 11.5–15.5)
WBC: 7.6 10*3/uL (ref 4.0–10.5)
nRBC: 0 % (ref 0.0–0.2)

## 2019-07-31 LAB — COMPREHENSIVE METABOLIC PANEL
ALT: 25 U/L (ref 0–44)
AST: 34 U/L (ref 15–41)
Albumin: 3.7 g/dL (ref 3.5–5.0)
Alkaline Phosphatase: 90 U/L (ref 38–126)
Anion gap: 11 (ref 5–15)
BUN: 49 mg/dL — ABNORMAL HIGH (ref 8–23)
CO2: 22 mmol/L (ref 22–32)
Calcium: 9.8 mg/dL (ref 8.9–10.3)
Chloride: 105 mmol/L (ref 98–111)
Creatinine, Ser: 2.32 mg/dL — ABNORMAL HIGH (ref 0.44–1.00)
GFR calc Af Amer: 25 mL/min — ABNORMAL LOW (ref >60)
GFR calc non Af Amer: 22 mL/min — ABNORMAL LOW (ref >60)
Glucose, Bld: 215 mg/dL — ABNORMAL HIGH (ref 70–99)
Potassium: 4.4 mmol/L (ref 3.5–5.1)
Sodium: 138 mmol/L (ref 135–145)
Total Bilirubin: 0.5 mg/dL (ref 0.3–1.2)
Total Protein: 7.2 g/dL (ref 6.5–8.1)

## 2019-07-31 LAB — GLUCOSE, CAPILLARY
Glucose-Capillary: 137 mg/dL — ABNORMAL HIGH (ref 70–99)
Glucose-Capillary: 222 mg/dL — ABNORMAL HIGH (ref 70–99)
Glucose-Capillary: 161 mg/dL — ABNORMAL HIGH (ref 70–99)
Glucose-Capillary: 244 mg/dL — ABNORMAL HIGH (ref 70–99)

## 2019-07-31 LAB — URINALYSIS, ROUTINE W REFLEX MICROSCOPIC
Bilirubin Urine: NEGATIVE
Glucose, UA: NEGATIVE mg/dL
Hgb urine dipstick: NEGATIVE
Ketones, ur: NEGATIVE mg/dL
Nitrite: NEGATIVE
Protein, ur: NEGATIVE mg/dL
Specific Gravity, Urine: 1.011 (ref 1.005–1.030)
pH: 5 (ref 5.0–8.0)

## 2019-07-31 LAB — HIV ANTIBODY (ROUTINE TESTING W REFLEX): HIV Screen 4th Generation wRfx: NONREACTIVE

## 2019-07-31 LAB — HEMOGLOBIN A1C
Hgb A1c MFr Bld: 8.1 % — ABNORMAL HIGH (ref 4.8–5.6)
Hgb A1c MFr Bld: 8.2 % — ABNORMAL HIGH (ref 4.8–5.6)
Mean Plasma Glucose: 185.77 mg/dL
Mean Plasma Glucose: 188.64 mg/dL

## 2019-07-31 LAB — SARS CORONAVIRUS 2 BY RT PCR (HOSPITAL ORDER, PERFORMED IN ~~LOC~~ HOSPITAL LAB): SARS Coronavirus 2: NEGATIVE

## 2019-07-31 MED ORDER — ACETAMINOPHEN 325 MG PO TABS: 650.0000 mg | ORAL_TABLET | Freq: Four times a day (QID) | ORAL | Status: DC | PRN

## 2019-07-31 MED ORDER — HEPARIN SODIUM (PORCINE) 5000 UNIT/ML IJ SOLN
5000.0000 [IU] | Freq: Three times a day (TID) | INTRAMUSCULAR | Status: DC
  Administered 2019-07-31: 5000 [IU] via SUBCUTANEOUS
  Filled 2019-07-31: qty 1

## 2019-07-31 MED ORDER — INSULIN ASPART 100 UNIT/ML ~~LOC~~ SOLN
0.0000 [IU] | Freq: Three times a day (TID) | SUBCUTANEOUS | Status: DC
  Administered 2019-07-31: 3 [IU] via SUBCUTANEOUS
  Administered 2019-07-31: 5 [IU] via SUBCUTANEOUS
  Administered 2019-08-01: 3 [IU] via SUBCUTANEOUS
  Administered 2019-08-01: 5 [IU] via SUBCUTANEOUS

## 2019-07-31 MED ORDER — INSULIN ASPART 100 UNIT/ML ~~LOC~~ SOLN
3.0000 [IU] | Freq: Three times a day (TID) | SUBCUTANEOUS | Status: DC
  Administered 2019-07-31 – 2019-08-01 (×4): 3 [IU] via SUBCUTANEOUS

## 2019-07-31 MED ORDER — MONTELUKAST SODIUM 10 MG PO TABS: 10.0000 mg | ORAL_TABLET | Freq: Every evening | ORAL | Status: DC | PRN

## 2019-07-31 MED ORDER — CYCLOSPORINE 0.05 % OP EMUL: 1.0000 [drp] | Freq: Two times a day (BID) | OPHTHALMIC | Status: DC | PRN

## 2019-07-31 MED ORDER — INSULIN ASPART 100 UNIT/ML ~~LOC~~ SOLN
0.0000 [IU] | Freq: Every day | SUBCUTANEOUS | Status: DC
  Administered 2019-07-31: 2 [IU] via SUBCUTANEOUS

## 2019-07-31 MED ORDER — INSULIN ASPART 100 UNIT/ML ~~LOC~~ SOLN
0.0000 [IU] | Freq: Three times a day (TID) | SUBCUTANEOUS | Status: DC
  Administered 2019-07-31: 1 [IU] via SUBCUTANEOUS

## 2019-07-31 MED ORDER — PANTOPRAZOLE SODIUM 40 MG PO TBEC
40.0000 mg | DELAYED_RELEASE_TABLET | Freq: Every day | ORAL | Status: DC
  Administered 2019-07-31 – 2019-08-01 (×2): 40 mg via ORAL
  Filled 2019-07-31 (×2): qty 1

## 2019-07-31 MED ORDER — ACETAMINOPHEN 650 MG RE SUPP: 650.0000 mg | Freq: Four times a day (QID) | RECTAL | Status: DC | PRN

## 2019-07-31 MED ORDER — ONDANSETRON HCL 4 MG/2ML IJ SOLN: 4.0000 mg | Freq: Four times a day (QID) | INTRAMUSCULAR | Status: DC | PRN

## 2019-07-31 MED ORDER — PAROXETINE HCL 20 MG PO TABS
40.0000 mg | ORAL_TABLET | Freq: Every day | ORAL | Status: DC
  Administered 2019-07-31 – 2019-08-01 (×2): 40 mg via ORAL
  Filled 2019-07-31 (×2): qty 2

## 2019-07-31 MED ORDER — VERAPAMIL HCL ER 240 MG PO TBCR
240.0000 mg | EXTENDED_RELEASE_TABLET | Freq: Every day | ORAL | Status: DC
  Administered 2019-08-01: 240 mg via ORAL
  Filled 2019-07-31: qty 1

## 2019-07-31 MED ORDER — ATORVASTATIN CALCIUM 10 MG PO TABS
20.0000 mg | ORAL_TABLET | Freq: Every day | ORAL | Status: DC
  Administered 2019-07-31 – 2019-08-01 (×2): 20 mg via ORAL
  Filled 2019-07-31 (×2): qty 2

## 2019-07-31 MED ORDER — ONDANSETRON HCL 4 MG PO TABS: 4.0000 mg | ORAL_TABLET | Freq: Four times a day (QID) | ORAL | Status: DC | PRN

## 2019-07-31 MED ORDER — SODIUM CHLORIDE 0.9 % IV SOLN
INTRAVENOUS | Status: DC
  Administered 2019-07-31 (×2): via INTRAVENOUS

## 2019-07-31 NOTE — H&P (Signed)
TRH H&P    Patient Demographics:    Barbara House, is a 63 y.o. female  MRN: [id number]  DOB - 01-12-1956  Admit Date - 07/30/2019  Referring MD/NP/PA: Dr. Roderic Palau  Outpatient Primary MD for the patient is Caren Macadam, MD  Patient coming from: Home  Chief complaint-dizziness   HPI:    Barbara House  is a 63 y.o. female, with history of hypertension, diabetes mellitus type 2 who was brought to the ED via EMS for dizziness.  Today she was seen by allergy specialist as she was having exacerbation of chronic sinus infection.  However she was found to be hypotensive at the clinic.  She was sent to ED for further evaluation.  Patient says that she has been vomiting for past 2 days.  Also had poor p.o. intake. In the ED, lab work revealed BUN/creatinine of 59/3.61, baseline creatinine 1.0 as of January 2020. Patient denies passing out. Denies chest pain or shortness of breath. Denies fever or dysuria. Denies abdominal pain     Review of systems:    In addition to the HPI above,   All other systems reviewed and are negative.    Past History of the following :    Past Medical History:  Diagnosis Date  . Allergic rhinitis   . Anxiety disorder   . Arthritis   . GERD (gastroesophageal reflux disease)   . Headache   . History of migraine   . Hypertension   . Type 2 diabetes mellitus ([place])    Not sure when she was diagnosed, greater than five years.       Past Surgical History:  Procedure Laterality Date  . ABDOMINAL HYSTERECTOMY     has ovaries.   [name] CARDIAC CATHETERIZATION N/A 08/03/2016   Procedure: Left Heart Cath and Coronary Angiography;  Surgeon: Peter M Martinique, MD;  Location: [facility];  Service: Cardiovascular;  Laterality: N/A;  . COLONOSCOPY WITH PROPOFOL N/A 11/05/2018   Dr. Oneida Alar: int/ext hemorrhoids, 60mm cecal tubular adenoma removed, next TCS in 5-10 years  .  ESOPHAGOGASTRODUODENOSCOPY (EGD) WITH PROPOFOL N/A 11/05/2018   Dr. Oneida Alar: esophagus was dilated, mild gastritis with benign bx  . Miinor skin surgery    . POLYPECTOMY  11/05/2018   Procedure: POLYPECTOMY;  Surgeon: Danie Binder, MD;  [facility]: [facility];  Service: Endoscopy;;  cecal polyp  . SAVORY DILATION N/A 11/05/2018   Procedure: SAVORY DILATION;  Surgeon: Danie Binder, MD;  Location: [facility];  Service: Endoscopy;  Laterality: N/A;      Social History:      Social History   Tobacco Use  . Smoking status: Never Smoker  . Smokeless tobacco: Never Used  Substance Use Topics  . Alcohol use: No    Alcohol/week: 0.0 standard drinks       Family History :     Family History  Problem Relation Age of Onset  . Hypertension Mother   . Diabetes Mother   . Hypertension Son   . Diabetes Son   . Cancer  Father   . Cirrhosis Brother   . Alcohol abuse Brother   . Colon cancer Neg Hx       Home Medications:   Prior to Admission medications   Medication Sig Start Date End Date Taking? Authorizing Provider  atorvastatin (LIPITOR) 40 MG tablet Take 0.5 tablets (20 mg total) by mouth daily. 03/27/18  Yes Hagler, Apolonio Schneiders, MD  Cyanocobalamin (VITAMIN B-12 PO) Take 1 tablet by mouth daily.   Yes [provider]  cycloSPORINE (RESTASIS) 0.05 % ophthalmic emulsion Place 1 drop into both eyes 2 (two) times daily as needed (for eye dryness.).   Yes [provider]  dexlansoprazole (DEXILANT) 60 MG capsule Take 1 capsule (60 mg total) by mouth daily before breakfast. 11/20/18  Yes Annitta Needs, NP  glipiZIDE (GLUCOTROL XL) 10 MG 24 hr tablet TAKE ONE TABLET BY MOUTH DAILY. TAKE WITH BREAKFAST. Patient taking differently: Take 10 mg by mouth daily with breakfast.  06/24/18  Yes Hagler, Apolonio Schneiders, MD  linagliptin (TRADJENTA) 5 MG TABS tablet Take 1 tablet (5 mg total) by mouth daily. 03/14/18  Yes Caren Macadam, MD  lisinopril (PRINIVIL,ZESTRIL) 40 MG tablet TAKE ONE  TABLET BY MOUTH DAILY. Patient taking differently: Take 40 mg by mouth daily.  06/24/18  Yes Caren Macadam, MD  metFORMIN (GLUCOPHAGE) 1000 MG tablet TAKE ONE TABLET BY MOUTH TWICE DAILY Patient taking differently: Take 1,000 mg by mouth 2 (two) times daily with a meal.  06/24/18  Yes Hagler, Apolonio Schneiders, MD  montelukast (SINGULAIR) 10 MG tablet Take 1 tablet (10 mg total) by mouth at bedtime as needed (for allergies.). 03/31/19  Yes Padgett, Rae Halsted, MD  nabumetone (RELAFEN) 750 MG tablet Take 750 mg by mouth as needed (pain).  11/03/16  Yes [provider]  PARoxetine (PAXIL) 40 MG tablet TAKE ONE TABLET BY MOUTH EVERY MORNING. Patient taking differently: Take 40 mg by mouth daily.  01/01/18  Yes Hagler, Apolonio Schneiders, MD  triamterene-hydrochlorothiazide (MAXZIDE-25) 37.5-25 MG tablet Take 1 tablet by mouth daily. 03/08/18  Yes Hagler, Apolonio Schneiders, MD  verapamil (CALAN-SR) 240 MG CR tablet TAKE ONE TABLET BY MOUTH EVERY EVENING. Patient taking differently: Take 240 mg by mouth daily after breakfast.  06/24/18  Yes Hagler, Apolonio Schneiders, MD  cefdinir (OMNICEF) 300 MG capsule Take 1 capsule (300 mg total) by mouth 2 (two) times daily for 10 days. 07/28/19 08/07/19  Valentina Shaggy, MD     Allergies:     Allergies  Allergen Reactions  . Prednisone Other (See Comments)    Hair loss     Physical Exam:   Vitals  Blood pressure 116/74, pulse 85, temperature 98.1 F (36.7 C), temperature source Oral, resp. rate 16, height 5\' 1"  (1.549 m), weight 79.4 kg, SpO2 100 %.  1.  General: Appears in no acute distress  2. Psychiatric: Alert, oriented x3, intact insight and judgment  3. Neurologic: Cranial nerves II through XII grossly intact, motor strength 5/5 in all extremities  4. HEENMT:  Atraumatic normocephalic, extraocular muscles are intact  5. Respiratory : Clear to auscultation bilaterally, no wheezing or crackles  6. Cardiovascular : S1-S2, regular, no murmur auscultated  7.  Gastrointestinal:  Abdomen soft, nontender, no organomegaly  8. Skin:  No rash noted      Data Review:    CBC Recent Labs  Lab 07/30/19 2058  WBC 7.8  HGB 13.2  HCT 43.2  PLT 97*  MCV 89.6  MCH 27.4  MCHC 30.6  RDW 12.4  LYMPHSABS 2.1  MONOABS 0.7  EOSABS 0.6*  BASOSABS 0.1   ------------------------------------------------------------------------------------------------------------------  Results for orders placed or performed during the hospital encounter of 07/30/19 (from the past 48 hour(s))  Basic metabolic panel     Status: Abnormal   Collection Time: 07/30/19  8:58 PM  Result Value Ref Range   Sodium 138 135 - 145 mmol/L   Potassium 4.1 3.5 - 5.1 mmol/L   Chloride 102 98 - 111 mmol/L   CO2 23 22 - 32 mmol/L   Glucose, Bld 175 (H) 70 - 99 mg/dL   BUN 59 (H) 8 - 23 mg/dL   Creatinine, Ser 3.61 (H) 0.44 - 1.00 mg/dL   Calcium 10.1 8.9 - 10.3 mg/dL   GFR calc non Af Amer 13 (L) >60 mL/min   GFR calc Af Amer 15 (L) >60 mL/min   Anion gap 13 5 - 15    Comment: Performed at [facility], [address] [place], [place] [address]  CBC with Differential     Status: Abnormal   Collection Time: 07/30/19  8:58 PM  Result Value Ref Range   WBC 7.8 4.0 - 10.5 K/uL   RBC 4.82 3.87 - 5.11 MIL/uL   Hemoglobin 13.2 12.0 - 15.0 g/dL   HCT 43.2 36.0 - 46.0 %   MCV 89.6 80.0 - 100.0 fL   MCH 27.4 26.0 - 34.0 pg   MCHC 30.6 30.0 - 36.0 g/dL   RDW 12.4 11.5 - 15.5 %   Platelets 97 (L) 150 - 400 K/uL    Comment: PLATELET COUNT CONFIRMED BY SMEAR Immature Platelet Fraction may be clinically indicated, consider ordering this additional test [id number]    nRBC 0.0 0.0 - 0.2 %   Neutrophils Relative % 57 %   Neutro Abs 4.4 1.7 - 7.7 K/uL   Lymphocytes Relative 26 %   Lymphs Abs 2.1 0.7 - 4.0 K/uL   Monocytes Relative 9 %   Monocytes Absolute 0.7 0.1 - 1.0 K/uL   Eosinophils Relative 7 %   Eosinophils Absolute 0.6 (H) 0.0 - 0.5 K/uL   Basophils Relative 1 %    Basophils Absolute 0.1 0.0 - 0.1 K/uL   Immature Granulocytes 0 %   Abs Immature Granulocytes 0.03 0.00 - 0.07 K/uL    Comment: Performed at [facility], [address] [place],  [address]  CBG monitoring, ED     Status: Abnormal   Collection Time: 07/30/19  9:24 PM  Result Value Ref Range   Glucose-Capillary 157 (H) 70 - 99 mg/dL  SARS Coronavirus 2 by RT PCR (hospital order, performed in [facility] hospital lab) Nasopharyngeal Nasopharyngeal Swab     Status: None   Collection Time: 07/30/19 11:34 PM   Specimen: Nasopharyngeal Swab  Result Value Ref Range   SARS Coronavirus 2 NEGATIVE NEGATIVE    Comment: (NOTE) If result is NEGATIVE SARS-CoV-2 target nucleic acids are NOT DETECTED. The SARS-CoV-2 RNA is generally detectable in upper and lower  respiratory specimens during the acute phase of infection. The lowest  concentration of SARS-CoV-2 viral copies this assay can detect is 250  copies / mL. A negative result does not preclude SARS-CoV-2 infection  and should not be used as the sole basis for treatment or other  patient management decisions.  A negative result may occur with  improper specimen collection / handling, submission of specimen other  than nasopharyngeal swab, presence of viral mutation(s) within the  areas targeted by this assay, and inadequate number of viral copies  (<  250 copies / mL). A negative result must be combined with clinical  observations, patient history, and epidemiological information. If result is POSITIVE SARS-CoV-2 target nucleic acids are DETECTED. The SARS-CoV-2 RNA is generally detectable in upper and lower  respiratory specimens dur ing the acute phase of infection.  Positive  results are indicative of active infection with SARS-CoV-2.  Clinical  correlation with patient history and other diagnostic information is  necessary to determine patient infection status.  Positive results do  not rule out bacterial infection or co-infection  with other viruses. If result is PRESUMPTIVE POSTIVE SARS-CoV-2 nucleic acids MAY BE PRESENT.   A presumptive positive result was obtained on the submitted specimen  and confirmed on repeat testing.  While 2019 novel coronavirus  (SARS-CoV-2) nucleic acids may be present in the submitted sample  additional confirmatory testing may be necessary for epidemiological  and / or clinical management purposes  to differentiate between  SARS-CoV-2 and other Sarbecovirus currently known to infect humans.  If clinically indicated additional testing with an alternate test  methodology [phone]) is advised. The SARS-CoV-2 RNA is generally  detectable in upper and lower respiratory sp ecimens during the acute  phase of infection. The expected result is Negative. Fact Sheet for Patients:  StrictlyIdeas.no Fact Sheet for Healthcare Providers: BankingDealers.co.za This test is not yet approved or cleared by the [place] FDA and has been authorized for detection and/or diagnosis of SARS-CoV-2 by FDA under an Emergency Use Authorization (EUA).  This EUA will remain in effect (meaning this test can be used) for the duration of the COVID-19 declaration under Section 564(b)(1) of the Act, 21 U.S.C. section 360bbb-3(b)(1), unless the authorization is terminated or revoked sooner. Performed at [facility], [address] [place], [place] [address]     Chemistries  Recent Labs  Lab 07/30/19 2058  NA 138  K 4.1  CL 102  CO2 23  GLUCOSE 175*  BUN 59*  CREATININE 3.61*  CALCIUM 10.1   CBG: Recent Labs  Lab 07/30/19 2124  GLUCAP 157*    --------------------------------------------------------------------------------------------------------------- Urine analysis: No results found for: COLORURINE, APPEARANCEUR, LABSPEC, PHURINE, GLUCOSEU, HGBUR, BILIRUBINUR, KETONESUR, PROTEINUR, UROBILINOGEN, NITRITE, LEUKOCYTESUR    Imaging Results:    Ct  Head Wo Contrast  Result Date: 07/30/2019 CLINICAL DATA:  Dizziness, headache, blurred vision EXAM: CT HEAD WITHOUT CONTRAST TECHNIQUE: Contiguous axial images were obtained from the base of the skull through the vertex without intravenous contrast. COMPARISON:  Maxillofacial CT 05/02/2017 FINDINGS: Brain: No evidence of acute infarction, hemorrhage, hydrocephalus, extra-axial collection or mass lesion/mass effect. Vascular: Atherosclerotic calcification of the carotid siphons. No hyperdense vessel. Skull: No calvarial fracture or suspicious osseous lesion. No scalp swelling or hematoma. Sinuses/Orbits: Paranasal sinuses and mastoid air cells are predominantly clear. Included orbital structures are unremarkable. Other: None IMPRESSION: No acute intracranial abnormality. Electronically Signed   By: Lovena Le M.D.   On: 07/30/2019 22:21    My personal review of EKG: Rhythm NSR, no ST-T changes   Assessment & Plan:    Active Problems:   AKI (acute kidney injury) ([place])   1. Acute kidney injury-likely from poor p.o. intake, vomiting, diuretics, lisinopril.  Will obtain renal ultrasound in a.m., check urine sodium and creatinine.  Start normal saline at 100 mL/h.  Follow BMP in a.m.  Strict intake and output.  Avoid nephrotoxins.  Hold Maxide, lisinopril.  2. Dizziness-likely from acute kidney injury, CT head is negative.  Dizziness improved after IV fluids.  3. Diabetes mellitus type  2-hold Metformin, Tradjenta, Glucotrol.  Will start sliding scale insulin with NovoLog.  4. Hyperlipidemia-continue Lipitor.  5. Hypertension-hold Maxide, lisinopril, verapamil due to above.  Consider restarting verapamil if patient blood pressure starts to go up in a.m.  6. GERD-continue PPI    DVT Prophylaxis-   Heparin  AM Labs Ordered, also please review Full Orders  Family Communication: Admission, patients condition and plan of care including tests being ordered have been discussed with the patient   who indicate understanding and agree with the plan and Code Status.  Code Status: Full code  Admission status: Observation: Based on patients clinical presentation and evaluation of above clinical data, I have made determination that patient will need less than 2 midnight stay in the hospital.  Time spent in minutes : 60 minutes   Oswald Hillock M.D on 07/31/2019 at 2:14 AM

## 2019-07-31 NOTE — Plan of Care (Signed)

## 2019-07-31 NOTE — Progress Notes (Signed)
Per HPI: Barbara House  is a 63 y.o. female, with history of hypertension, diabetes mellitus type 2 who was brought to the ED via EMS for dizziness.  Today she was seen by allergy specialist as she was having exacerbation of chronic sinus infection.  However she was found to be hypotensive at the clinic.  She was sent to ED for further evaluation.  Patient says that she has been vomiting for past 2 days.  Also had poor p.o. intake. In the ED, lab work revealed BUN/creatinine of 59/3.61, baseline creatinine 1.0 as of January 2020. Patient denies passing out. Denies chest pain or shortness of breath. Denies fever or dysuria. Denies abdominal pain  Patient was admitted with acute kidney injury that appears to be prerenal and related to poor oral intake as well as vomiting and diuretics.  Nephrotoxic agents have been withheld and creatinine is downtrending to 2.32 this morning from 3.61 on admission.  Continue IV fluid.  She appears to be tolerating her diet.  Continue to monitor into a.m. and maintain on IV fluid.  Repeat a.m. labs.  Patient assessed at bedside this morning.  Total care time: 20 minutes.

## 2019-07-31 NOTE — Care Management Obs Status (Signed)
[facility] NOTIFICATION   Patient Details  Name: AVISHKA STATLER MRN: [id number] Date of Birth: [birth year]   Medicare Observation Status Notification Given:  Yes    Shade Flood, LCSW 07/31/2019, 11:09 AM

## 2019-08-01 ENCOUNTER — Ambulatory Visit: Payer: Medicare Other | Admitting: Allergy & Immunology

## 2019-08-01 DIAGNOSIS — N179 Acute kidney failure, unspecified: Secondary | ICD-10-CM | POA: Diagnosis not present

## 2019-08-01 LAB — BASIC METABOLIC PANEL
Anion gap: 8 (ref 5–15)
BUN: 37 mg/dL — ABNORMAL HIGH (ref 8–23)
CO2: 23 mmol/L (ref 22–32)
Calcium: 9.4 mg/dL (ref 8.9–10.3)
Chloride: 106 mmol/L (ref 98–111)
Creatinine, Ser: 1.71 mg/dL — ABNORMAL HIGH (ref 0.44–1.00)
GFR calc Af Amer: 37 mL/min — ABNORMAL LOW (ref >60)
GFR calc non Af Amer: 32 mL/min — ABNORMAL LOW (ref >60)
Glucose, Bld: 242 mg/dL — ABNORMAL HIGH (ref 70–99)
Potassium: 4.7 mmol/L (ref 3.5–5.1)
Sodium: 137 mmol/L (ref 135–145)

## 2019-08-01 LAB — GLUCOSE, CAPILLARY
Glucose-Capillary: 151 mg/dL — ABNORMAL HIGH (ref 70–99)
Glucose-Capillary: 205 mg/dL — ABNORMAL HIGH (ref 70–99)

## 2019-08-01 NOTE — Discharge Summary (Signed)
Physician Discharge Summary  Barbara House [id number] DOB: Feb 28, 1956 DOA: 07/30/2019  PCP: Caren Macadam, MD  Admit date: 07/30/2019  Discharge date: 08/01/2019  Admitted From:Home  Disposition:  Home  Recommendations for Outpatient Follow-up:  1. Follow up with PCP in 3-5 days and repeat BMP at that time to ensure resolution of AKI 2. Stay off nephrotoxic agents to include lisinopril, triamterene-hctz, and nabumetone until seen by pcp. Also hold metformin for now until renal function further improved. 3. Continue other home medications as prior and patient has been told to call the office today to have close follow-up in the next 3 to 5 days which she is in agreement with. 4. Please continue aggressive hydration with fluids over the weekend.  Home Health: None  Equipment/Devices: None  Discharge Condition: Stable  CODE STATUS: Full  Diet recommendation: Heart Healthy/carb modified  Brief/Interim Summary: Per HPI: [name],with history of hypertension, diabetes mellitus type 2 who was brought to the ED via EMS for dizziness. Today she was seen by allergy specialist as she was having exacerbation of chronic sinus infection. However she was found to be hypotensive at the clinic. She was sent to ED for further evaluation. Patient says that she has been vomiting for past 2 days. Also had poor p.o. intake. In the ED, lab work revealed BUN/creatinine of59/3.61, baseline creatinine 1.0 as of January 2020. Patient denies passing out. Denies chest pain or shortness of breath. Denies fever or dysuria. Denies abdominal pain  10/15: Patient was admitted with acute kidney injury that appears to be prerenal and related to poor oral intake as well as vomiting and diuretics.  Nephrotoxic agents have been withheld and creatinine is downtrending to 2.32 this morning from 3.61 on admission.   10/16: On day of discharge, patient's creatinine is now down  to 1.7 and she is having great urine output of 1900 mL noted in the last 24 hours.  Her baseline creatinine level is 1 and this needs to be followed in the outpatient setting.  She has no further nausea or vomiting and has been encouraged to keep up her oral intake of fluids over the weekend.  She is to avoid her nephrotoxic medications as outlined above and will need close follow-up with her PCP for repeat renal panel and clearance to resume her medications when appropriate.  Patient understands this and is eager for discharge today.  She will call her PCP office today to schedule appointment early next week for lab follow-up.  No other acute events noted throughout the course of this admission.  Blood pressures and blood glucose have remained stable during this hospitalization without medications that she was taken previously.  We will continue on glipizide and linagliptin for diabetes as well as verapamil.  Renal ultrasound as noted below with no acute findings.  Discharge Diagnoses:  Active Problems:   AKI (acute kidney injury) ([place])  Principal discharge diagnosis: AKI secondary to prerenal causes of poor oral intake with nausea and vomiting along with nephrotoxic agents.  Discharge Instructions  Discharge Instructions    Diet - low sodium heart healthy   Complete by: As directed    Increase activity slowly   Complete by: As directed      Allergies as of 08/01/2019      Reactions   Prednisone Other (See Comments)   Hair loss      Medication List    STOP taking these medications   lisinopril 40 MG tablet Commonly known as: ZESTRIL  metFORMIN 1000 MG tablet Commonly known as: GLUCOPHAGE   nabumetone 750 MG tablet Commonly known as: RELAFEN   triamterene-hydrochlorothiazide 37.5-25 MG tablet Commonly known as: MAXZIDE-25     TAKE these medications   atorvastatin 40 MG tablet Commonly known as: LIPITOR Take 0.5 tablets (20 mg total) by mouth daily.   cefdinir 300 MG  capsule Commonly known as: OMNICEF Take 1 capsule (300 mg total) by mouth 2 (two) times daily for 10 days.   cycloSPORINE 0.05 % ophthalmic emulsion Commonly known as: RESTASIS Place 1 drop into both eyes 2 (two) times daily as needed (for eye dryness.).   dexlansoprazole 60 MG capsule Commonly known as: Dexilant Take 1 capsule (60 mg total) by mouth daily before breakfast.   glipiZIDE 10 MG 24 hr tablet Commonly known as: GLUCOTROL XL TAKE ONE TABLET BY MOUTH DAILY. TAKE WITH BREAKFAST. What changed: See the new instructions.   linagliptin 5 MG Tabs tablet Commonly known as: Tradjenta Take 1 tablet (5 mg total) by mouth daily.   montelukast 10 MG tablet Commonly known as: SINGULAIR Take 1 tablet (10 mg total) by mouth at bedtime as needed (for allergies.).   PARoxetine 40 MG tablet Commonly known as: PAXIL TAKE ONE TABLET BY MOUTH EVERY MORNING. What changed: when to take this   verapamil 240 MG CR tablet Commonly known as: CALAN-SR TAKE ONE TABLET BY MOUTH EVERY EVENING. What changed: when to take this   VITAMIN B-12 PO Take 1 tablet by mouth daily.      Follow-up Information    Caren Macadam, MD. Schedule an appointment as soon as possible for a visit in 5 day(s).   Specialty: Family Medicine Contact information: [place] [address] [phone]          Allergies  Allergen Reactions  . Prednisone Other (See Comments)    Hair loss    Consultations:  None   Procedures/Studies: Ct Head Wo Contrast  Result Date: 07/30/2019 CLINICAL DATA:  Dizziness, headache, blurred vision EXAM: CT HEAD WITHOUT CONTRAST TECHNIQUE: Contiguous axial images were obtained from the base of the skull through the vertex without intravenous contrast. COMPARISON:  Maxillofacial CT 05/02/2017 FINDINGS: Brain: No evidence of acute infarction, hemorrhage, hydrocephalus, extra-axial collection or mass lesion/mass effect. Vascular: Atherosclerotic  calcification of the carotid siphons. No hyperdense vessel. Skull: No calvarial fracture or suspicious osseous lesion. No scalp swelling or hematoma. Sinuses/Orbits: Paranasal sinuses and mastoid air cells are predominantly clear. Included orbital structures are unremarkable. Other: None IMPRESSION: No acute intracranial abnormality. Electronically Signed   By: Lovena Le M.D.   On: 07/30/2019 22:21   US Renal  Result Date: 07/31/2019 CLINICAL DATA:  Hydronephrosis. EXAM: RENAL / URINARY TRACT ULTRASOUND COMPLETE COMPARISON:  Ultrasound 08/15/2017. FINDINGS: Right Kidney: Renal measurements: 10.6 x 6.4 x 6.1 cm = volume: 215 mL. 1.6 cm simple cyst. No hydronephrosis. Left Kidney: Renal measurements: 9.8 x 5.6 x 5.4 cm = volume: 133 mL. Echogenicity within normal limits. No mass or hydronephrosis visualized. Bladder: Appears normal for degree of bladder distention. No ureteral jets visualized. Other: IMPRESSION: 1. 1.6 cm simple right renal cyst again noted. Similar findings noted on prior exam. 2. Exam otherwise unremarkable. No hydronephrosis. Electronically Signed   By: Marcello Moores  Register   On: 07/31/2019 14:08     Discharge Exam: Vitals:   07/31/19 2057 08/01/19 0531  BP: 124/76 117/76  Pulse: 88 84  Resp: 16 16  Temp: 98.1 F (36.7 C) 97.8 F (36.6 C)  SpO2: 99% 100%   Vitals:   07/31/19 1404 07/31/19 1932 07/31/19 2057 08/01/19 0531  BP: 134/79  124/76 117/76  Pulse: 87  88 84  Resp: 16  16 16   Temp: 98.2 F (36.8 C)  98.1 F (36.7 C) 97.8 F (36.6 C)  TempSrc: Oral  Oral Oral  SpO2: 100% 97% 99% 100%  Weight:      Height:        General: Pt is alert, awake, not in acute distress Cardiovascular: RRR, S1/S2 +, no rubs, no gallops Respiratory: CTA bilaterally, no wheezing, no rhonchi Abdominal: Soft, NT, ND, bowel sounds + Extremities: no edema, no cyanosis    The results of significant diagnostics from this hospitalization (including imaging, microbiology, ancillary and  laboratory) are listed below for reference.     Microbiology: Recent Results (from the past 240 hour(s))  SARS Coronavirus 2 by RT PCR (hospital order, performed in [facility] hospital lab) Nasopharyngeal Nasopharyngeal Swab     Status: None   Collection Time: 07/30/19 11:34 PM   Specimen: Nasopharyngeal Swab  Result Value Ref Range Status   SARS Coronavirus 2 NEGATIVE NEGATIVE Final    Comment: (NOTE) If result is NEGATIVE SARS-CoV-2 target nucleic acids are NOT DETECTED. The SARS-CoV-2 RNA is generally detectable in upper and lower  respiratory specimens during the acute phase of infection. The lowest  concentration of SARS-CoV-2 viral copies this assay can detect is 250  copies / mL. A negative result does not preclude SARS-CoV-2 infection  and should not be used as the sole basis for treatment or other  patient management decisions.  A negative result may occur with  improper specimen collection / handling, submission of specimen other  than nasopharyngeal swab, presence of viral mutation(s) within the  areas targeted by this assay, and inadequate number of viral copies  (<250 copies / mL). A negative result must be combined with clinical  observations, patient history, and epidemiological information. If result is POSITIVE SARS-CoV-2 target nucleic acids are DETECTED. The SARS-CoV-2 RNA is generally detectable in upper and lower  respiratory specimens dur ing the acute phase of infection.  Positive  results are indicative of active infection with SARS-CoV-2.  Clinical  correlation with patient history and other diagnostic information is  necessary to determine patient infection status.  Positive results do  not rule out bacterial infection or co-infection with other viruses. If result is PRESUMPTIVE POSTIVE SARS-CoV-2 nucleic acids MAY BE PRESENT.   A presumptive positive result was obtained on the submitted specimen  and confirmed on repeat testing.  While 2019 novel  coronavirus  (SARS-CoV-2) nucleic acids may be present in the submitted sample  additional confirmatory testing may be necessary for epidemiological  and / or clinical management purposes  to differentiate between  SARS-CoV-2 and other Sarbecovirus currently known to infect humans.  If clinically indicated additional testing with an alternate test  methodology [phone]) is advised. The SARS-CoV-2 RNA is generally  detectable in upper and lower respiratory sp ecimens during the acute  phase of infection. The expected result is Negative. Fact Sheet for Patients:  StrictlyIdeas.no Fact Sheet for Healthcare Providers: BankingDealers.co.za This test is not yet approved or cleared by the [place] FDA and has been authorized for detection and/or diagnosis of SARS-CoV-2 by FDA under an Emergency Use Authorization (EUA).  This EUA will remain in effect (meaning this test can be used) for the duration of the COVID-19 declaration under Section 564(b)(1) of the Act, 21 U.S.C. section 360bbb-3(b)(1),  unless the authorization is terminated or revoked sooner. Performed at [facility], [address], [place], [place] [address]      Labs: BNP (last 3 results) No results for input(s): BNP in the last 8760 hours. Basic Metabolic Panel: Recent Labs  Lab 07/30/19 2058 07/31/19 0938 08/01/19 0423  NA 138 138 137  K 4.1 4.4 4.7  CL 102 105 106  CO2 23 22 23   GLUCOSE 175* 215* 242*  BUN 59* 49* 37*  CREATININE 3.61* 2.32* 1.71*  CALCIUM 10.1 9.8 9.4   Liver Function Tests: Recent Labs  Lab 07/31/19 0938  AST 34  ALT 25  ALKPHOS 90  BILITOT 0.5  PROT 7.2  ALBUMIN 3.7   No results for input(s): LIPASE, AMYLASE in the last 168 hours. No results for input(s): AMMONIA in the last 168 hours. CBC: Recent Labs  Lab 07/30/19 2058 07/31/19 0938  WBC 7.8 7.6  NEUTROABS 4.4  --   HGB 13.2 12.0  HCT 43.2 39.0  MCV 89.6 89.7  PLT 97* 97*    Cardiac Enzymes: No results for input(s): CKTOTAL, CKMB, CKMBINDEX, TROPONINI in the last 168 hours. BNP: Invalid input(s): POCBNP CBG: Recent Labs  Lab 07/31/19 0741 07/31/19 1116 07/31/19 1613 07/31/19 2058 08/01/19 0815  GLUCAP 137* 222* 161* 244* 151*   D-Dimer No results for input(s): DDIMER in the last 72 hours. Hgb A1c Recent Labs    07/30/19 2058 07/31/19 0938  HGBA1C 8.1* 8.2*   Lipid Profile No results for input(s): CHOL, HDL, LDLCALC, TRIG, CHOLHDL, LDLDIRECT in the last 72 hours. Thyroid function studies No results for input(s): TSH, T4TOTAL, T3FREE, THYROIDAB in the last 72 hours.  Invalid input(s): FREET3 Anemia work up No results for input(s): VITAMINB12, FOLATE, FERRITIN, TIBC, IRON, RETICCTPCT in the last 72 hours. Urinalysis    Component Value Date/Time   COLORURINE STRAW (A) 07/31/2019 0530   APPEARANCEUR CLEAR 07/31/2019 0530   LABSPEC 1.011 07/31/2019 0530   PHURINE 5.0 07/31/2019 0530   GLUCOSEU NEGATIVE 07/31/2019 0530   HGBUR NEGATIVE 07/31/2019 0530   BILIRUBINUR NEGATIVE 07/31/2019 0530   KETONESUR NEGATIVE 07/31/2019 0530   PROTEINUR NEGATIVE 07/31/2019 0530   NITRITE NEGATIVE 07/31/2019 0530   LEUKOCYTESUR SMALL (A) 07/31/2019 0530   Sepsis Labs Invalid input(s): PROCALCITONIN,  WBC,  LACTICIDVEN Microbiology Recent Results (from the past 240 hour(s))  SARS Coronavirus 2 by RT PCR (hospital order, performed in [facility] hospital lab) Nasopharyngeal Nasopharyngeal Swab     Status: None   Collection Time: 07/30/19 11:34 PM   Specimen: Nasopharyngeal Swab  Result Value Ref Range Status   SARS Coronavirus 2 NEGATIVE NEGATIVE Final    Comment: (NOTE) If result is NEGATIVE SARS-CoV-2 target nucleic acids are NOT DETECTED. The SARS-CoV-2 RNA is generally detectable in upper and lower  respiratory specimens during the acute phase of infection. The lowest  concentration of SARS-CoV-2 viral copies this assay can detect is 250   copies / mL. A negative result does not preclude SARS-CoV-2 infection  and should not be used as the sole basis for treatment or other  patient management decisions.  A negative result may occur with  improper specimen collection / handling, submission of specimen other  than nasopharyngeal swab, presence of viral mutation(s) within the  areas targeted by this assay, and inadequate number of viral copies  (<250 copies / mL). A negative result must be combined with clinical  observations, patient history, and epidemiological information. If result is POSITIVE SARS-CoV-2 target nucleic acids are  DETECTED. The SARS-CoV-2 RNA is generally detectable in upper and lower  respiratory specimens dur ing the acute phase of infection.  Positive  results are indicative of active infection with SARS-CoV-2.  Clinical  correlation with patient history and other diagnostic information is  necessary to determine patient infection status.  Positive results do  not rule out bacterial infection or co-infection with other viruses. If result is PRESUMPTIVE POSTIVE SARS-CoV-2 nucleic acids MAY BE PRESENT.   A presumptive positive result was obtained on the submitted specimen  and confirmed on repeat testing.  While 2019 novel coronavirus  (SARS-CoV-2) nucleic acids may be present in the submitted sample  additional confirmatory testing may be necessary for epidemiological  and / or clinical management purposes  to differentiate between  SARS-CoV-2 and other Sarbecovirus currently known to infect humans.  If clinically indicated additional testing with an alternate test  methodology [phone]) is advised. The SARS-CoV-2 RNA is generally  detectable in upper and lower respiratory sp ecimens during the acute  phase of infection. The expected result is Negative. Fact Sheet for Patients:  StrictlyIdeas.no Fact Sheet for Healthcare  Providers: BankingDealers.co.za This test is not yet approved or cleared by the [place] FDA and has been authorized for detection and/or diagnosis of SARS-CoV-2 by FDA under an Emergency Use Authorization (EUA).  This EUA will remain in effect (meaning this test can be used) for the duration of the COVID-19 declaration under Section 564(b)(1) of the Act, 21 U.S.C. section 360bbb-3(b)(1), unless the authorization is terminated or revoked sooner. Performed at [facility], [address], [place], [place] [address]      Time coordinating discharge: 35 minutes  SIGNED:   Rodena Goldmann, DO [facility] 08/01/2019, 8:58 AM  If 7PM-7AM, please contact night-coverage [email or web address] Password [email or web address]

## 2019-08-05 ENCOUNTER — Encounter: Payer: Self-pay | Admitting: Gastroenterology

## 2019-08-11 DIAGNOSIS — E782 Mixed hyperlipidemia: Secondary | ICD-10-CM | POA: Diagnosis not present

## 2019-08-11 DIAGNOSIS — Z9289 Personal history of other medical treatment: Secondary | ICD-10-CM | POA: Diagnosis not present

## 2019-08-11 DIAGNOSIS — E1143 Type 2 diabetes mellitus with diabetic autonomic (poly)neuropathy: Secondary | ICD-10-CM | POA: Diagnosis not present

## 2019-08-11 DIAGNOSIS — E538 Deficiency of other specified B group vitamins: Secondary | ICD-10-CM | POA: Diagnosis not present

## 2019-08-11 DIAGNOSIS — F419 Anxiety disorder, unspecified: Secondary | ICD-10-CM | POA: Diagnosis not present

## 2019-08-11 DIAGNOSIS — D696 Thrombocytopenia, unspecified: Secondary | ICD-10-CM | POA: Diagnosis not present

## 2019-08-11 DIAGNOSIS — N179 Acute kidney failure, unspecified: Secondary | ICD-10-CM | POA: Diagnosis not present

## 2019-08-11 DIAGNOSIS — I10 Essential (primary) hypertension: Secondary | ICD-10-CM | POA: Diagnosis not present

## 2019-08-11 DIAGNOSIS — K219 Gastro-esophageal reflux disease without esophagitis: Secondary | ICD-10-CM | POA: Diagnosis not present

## 2019-08-19 DIAGNOSIS — M5030 Other cervical disc degeneration, unspecified cervical region: Secondary | ICD-10-CM | POA: Diagnosis not present

## 2019-08-19 DIAGNOSIS — M2560 Stiffness of unspecified joint, not elsewhere classified: Secondary | ICD-10-CM | POA: Diagnosis not present

## 2019-08-19 DIAGNOSIS — M6281 Muscle weakness (generalized): Secondary | ICD-10-CM | POA: Diagnosis not present

## 2019-08-25 DIAGNOSIS — B373 Candidiasis of vulva and vagina: Secondary | ICD-10-CM | POA: Diagnosis not present

## 2019-08-25 DIAGNOSIS — N179 Acute kidney failure, unspecified: Secondary | ICD-10-CM | POA: Diagnosis not present

## 2019-08-26 DIAGNOSIS — E1149 Type 2 diabetes mellitus with other diabetic neurological complication: Secondary | ICD-10-CM | POA: Diagnosis not present

## 2019-08-26 DIAGNOSIS — M79675 Pain in left toe(s): Secondary | ICD-10-CM | POA: Diagnosis not present

## 2019-08-26 DIAGNOSIS — M79674 Pain in right toe(s): Secondary | ICD-10-CM | POA: Diagnosis not present

## 2019-08-26 DIAGNOSIS — L84 Corns and callosities: Secondary | ICD-10-CM | POA: Diagnosis not present

## 2019-08-26 DIAGNOSIS — L03032 Cellulitis of left toe: Secondary | ICD-10-CM | POA: Diagnosis not present

## 2019-08-26 DIAGNOSIS — L600 Ingrowing nail: Secondary | ICD-10-CM | POA: Diagnosis not present

## 2019-08-26 DIAGNOSIS — B351 Tinea unguium: Secondary | ICD-10-CM | POA: Diagnosis not present

## 2019-08-26 DIAGNOSIS — L602 Onychogryphosis: Secondary | ICD-10-CM | POA: Diagnosis not present

## 2019-08-28 ENCOUNTER — Encounter (HOSPITAL_COMMUNITY): Payer: Self-pay | Admitting: *Deleted

## 2019-08-28 ENCOUNTER — Other Ambulatory Visit: Payer: Self-pay

## 2019-08-29 ENCOUNTER — Inpatient Hospital Stay (HOSPITAL_COMMUNITY): Payer: Medicare Other | Attending: Hematology | Admitting: Hematology

## 2019-08-29 ENCOUNTER — Encounter (HOSPITAL_COMMUNITY): Payer: Self-pay | Admitting: Hematology

## 2019-08-29 ENCOUNTER — Inpatient Hospital Stay (HOSPITAL_COMMUNITY): Payer: Medicare Other

## 2019-08-29 VITALS — BP 146/78 | HR 64 | Temp 97.5°F | Resp 20 | Ht 61.0 in | Wt 191.7 lb

## 2019-08-29 DIAGNOSIS — Z8249 Family history of ischemic heart disease and other diseases of the circulatory system: Secondary | ICD-10-CM | POA: Diagnosis not present

## 2019-08-29 DIAGNOSIS — Z90710 Acquired absence of both cervix and uterus: Secondary | ICD-10-CM | POA: Diagnosis not present

## 2019-08-29 DIAGNOSIS — D649 Anemia, unspecified: Secondary | ICD-10-CM | POA: Insufficient documentation

## 2019-08-29 DIAGNOSIS — D696 Thrombocytopenia, unspecified: Secondary | ICD-10-CM | POA: Diagnosis not present

## 2019-08-29 DIAGNOSIS — Z809 Family history of malignant neoplasm, unspecified: Secondary | ICD-10-CM | POA: Insufficient documentation

## 2019-08-29 DIAGNOSIS — Z79899 Other long term (current) drug therapy: Secondary | ICD-10-CM | POA: Diagnosis not present

## 2019-08-29 DIAGNOSIS — Z833 Family history of diabetes mellitus: Secondary | ICD-10-CM | POA: Diagnosis not present

## 2019-08-29 DIAGNOSIS — I10 Essential (primary) hypertension: Secondary | ICD-10-CM | POA: Insufficient documentation

## 2019-08-29 DIAGNOSIS — E119 Type 2 diabetes mellitus without complications: Secondary | ICD-10-CM | POA: Insufficient documentation

## 2019-08-29 DIAGNOSIS — Z7984 Long term (current) use of oral hypoglycemic drugs: Secondary | ICD-10-CM

## 2019-08-29 LAB — HEPATITIS B SURFACE ANTIGEN: Hepatitis B Surface Ag: NONREACTIVE

## 2019-08-29 LAB — CBC WITH DIFFERENTIAL/PLATELET
Abs Immature Granulocytes: 0.02 10*3/uL (ref 0.00–0.07)
Basophils Absolute: 0.1 10*3/uL (ref 0.0–0.1)
Basophils Relative: 1 %
Eosinophils Absolute: 0.3 10*3/uL (ref 0.0–0.5)
Eosinophils Relative: 5 %
HCT: 39.1 % (ref 36.0–46.0)
Hemoglobin: 11.9 g/dL — ABNORMAL LOW (ref 12.0–15.0)
Immature Granulocytes: 0 %
Lymphocytes Relative: 27 %
Lymphs Abs: 1.7 10*3/uL (ref 0.7–4.0)
MCH: 27.4 pg (ref 26.0–34.0)
MCHC: 30.4 g/dL (ref 30.0–36.0)
MCV: 90.1 fL (ref 80.0–100.0)
Monocytes Absolute: 0.5 10*3/uL (ref 0.1–1.0)
Monocytes Relative: 8 %
Neutro Abs: 3.7 10*3/uL (ref 1.7–7.7)
Neutrophils Relative %: 59 %
Platelets: 109 10*3/uL — ABNORMAL LOW (ref 150–400)
RBC: 4.34 MIL/uL (ref 3.87–5.11)
RDW: 12.9 % (ref 11.5–15.5)
WBC: 6.3 10*3/uL (ref 4.0–10.5)
nRBC: 0 % (ref 0.0–0.2)

## 2019-08-29 LAB — IRON AND TIBC
Iron: 57 ug/dL (ref 28–170)
Saturation Ratios: 13 % (ref 10.4–31.8)
TIBC: 450 ug/dL (ref 250–450)
UIBC: 393 ug/dL

## 2019-08-29 LAB — FERRITIN: Ferritin: 44 ng/mL (ref 11–307)

## 2019-08-29 LAB — SEDIMENTATION RATE: Sed Rate: 26 mm/hr — ABNORMAL HIGH (ref 0–22)

## 2019-08-29 LAB — HEPATITIS B SURFACE ANTIBODY,QUALITATIVE: Hep B S Ab: NONREACTIVE

## 2019-08-29 LAB — HEPATITIS C ANTIBODY: HCV Ab: NONREACTIVE

## 2019-08-29 LAB — FOLATE: Folate: 8.1 ng/mL (ref >5.9)

## 2019-08-29 LAB — LACTATE DEHYDROGENASE: LDH: 187 U/L (ref 98–192)

## 2019-08-29 LAB — VITAMIN B12: Vitamin B-12: 1497 pg/mL — ABNORMAL HIGH (ref 180–914)

## 2019-08-29 LAB — HEPATITIS B CORE ANTIBODY, TOTAL: Hep B Core Total Ab: NONREACTIVE

## 2019-08-29 NOTE — Assessment & Plan Note (Addendum)
1.  Mild to moderate thrombocytopenia: -CBC on 08/11/2019 shows platelet count of 108.  Hemoglobin was 11.3 with normal MCV and normal white count. -CBC on 06/16/2019 shows platelet count 116 with hemoglobin 11.7. -I have reviewed CBC on EMR which showed platelet count 97 on 07/31/2019 and 93 on 10/30/2018. -She denies any fevers, night sweats or weight loss. -No easy bruising or bleeding reported. -She is on vitamin B12 injections and also taking B12 pills.  She reported blood transfusion many years ago. -I have recommended CBC with differential, LDH and reticulocyte count.  We will review her smear.  We will check nutritional deficiencies including folic acid, methylmalonic acid, copper, and infectious etiologies including hepatitis panel.  We will also check for ANA and rheumatoid factor.  Will check for serum protein allergy versus. -Differential diagnosis includes immune mediated thrombocytopenia.  We will see her back in 2 to 3 weeks to discuss results.  2.  Normocytic anemia: -She has mild anemia between 11 and 12.  She denies any bleeding per rectum or melena. -We will check ferritin and iron panel.  We are also checking [id number], folic acid and copper levels.

## 2019-08-29 NOTE — Progress Notes (Signed)
CONSULT NOTE  Patient Care Team: Caren Macadam, MD as PCP - General (Family Medicine) Lenard Forth (Optometry) Danie Binder, MD as Consulting Physician (Gastroenterology) Ernst Bowler Gwenith Daily, MD as Consulting Physician (Allergy and Immunology)  CHIEF COMPLAINTS/PURPOSE OF CONSULTATION:  Mild to moderate thrombocytopenia.  HISTORY OF PRESENTING ILLNESS:  Barbara House 63 y.o. female is seen in consultation today at the request of Dr. Mannie Stabile for further work-up and management of thrombocytopenia.  CBC done and Dr. Roma Kayser office on 08/11/2019 showed platelet count of 108.  Prior to that CBC on 06/16/2019 showed platelet count 116.  Her platelet count was in the 70s in July 2020.  She denies any easy bruising or bleeding including nosebleeds, hematuria.  She reports having blood transfusion many years ago.  Denies any new medications.  Denies taking any over-the-counter herbal supplements.  Denies use of quinine supplements.  No excessive eating of walnuts.  She has been on disability at this time.  She never smoked.  No exposure to chemicals.  Family history significant for father with stomach cancer and maternal great aunt with cancer.  Maternal grandmother had throat cancer.  No family history of bleeding disorders.  Appetite and energy levels are 100%.   MEDICAL HISTORY:  Past Medical History:  Diagnosis Date  . Allergic rhinitis   . Anxiety disorder   . Arthritis   . GERD (gastroesophageal reflux disease)   . Headache   . History of migraine   . Hypertension   . Type 2 diabetes mellitus ([place])    Not sure when she was diagnosed, greater than five years.     SURGICAL HISTORY: Past Surgical History:  Procedure Laterality Date  . ABDOMINAL HYSTERECTOMY     has ovaries.   [name] CARDIAC CATHETERIZATION N/A 08/03/2016   Procedure: Left Heart Cath and Coronary Angiography;  Surgeon: Peter M Martinique, MD;  Location: [facility];  Service: Cardiovascular;  Laterality:  N/A;  . COLONOSCOPY WITH PROPOFOL N/A 11/05/2018   Dr. Oneida Alar: int/ext hemorrhoids, 97mm cecal tubular adenoma removed, next TCS in 5-10 years  . ESOPHAGOGASTRODUODENOSCOPY (EGD) WITH PROPOFOL N/A 11/05/2018   Dr. Oneida Alar: esophagus was dilated, mild gastritis with benign bx  . Miinor skin surgery    . POLYPECTOMY  11/05/2018   Procedure: POLYPECTOMY;  Surgeon: Danie Binder, MD;  [facility];  Service: Endoscopy;;  cecal polyp  . SAVORY DILATION N/A 11/05/2018   Procedure: SAVORY DILATION;  Surgeon: Danie Binder, MD;  [facility];  Service: Endoscopy;  Laterality: N/A;    SOCIAL HISTORY: Social History   Socioeconomic History  . Marital status: Single    Spouse name: Not on file  . Number of children: 1  . Years of education: Not on file  . Highest education level: Not on file  Occupational History  . Not on file  Social Needs  . Financial resource strain: Not on file  . Food insecurity    Worry: Not on file    Inability: Not on file  . Transportation needs    Medical: Not on file    Non-medical: Not on file  Tobacco Use  . Smoking status: Never Smoker  . Smokeless tobacco: Never Used  Substance and Sexual Activity  . Alcohol use: No    Alcohol/week: 0.0 standard drinks  . Drug use: No  . Sexual activity: Not Currently    Birth control/protection: Surgical  Lifestyle  . Physical activity    Days per  week: 7 days    Minutes per session: 20 min  . Stress: Only a little  Relationships  . Social Herbalist on phone: Not on file    Gets together: Not on file    Attends religious service: Not on file    Active member of club or organization: Not on file    Attends meetings of clubs or organizations: Not on file    Relationship status: Not on file  . Intimate partner violence    Fear of current or ex partner: Not on file    Emotionally abused: Not on file    Physically abused: Not on file    Forced sexual activity: Not on file   Other Topics Concern  . Not on file  Social History Narrative   Reports is on disability. Has one son. Lives in [place]. Enjoys time with family. Enjoys Firefighter. Attends church.     FAMILY HISTORY: Family History  Problem Relation Age of Onset  . Hypertension Mother   . Diabetes Mother   . Hypertension Son   . Diabetes Son   . Cancer Father   . Cirrhosis Brother   . Alcohol abuse Brother   . Colon cancer Neg Hx     ALLERGIES:  is allergic to prednisone.  MEDICATIONS:  Current Outpatient Medications  Medication Sig Dispense Refill  . atorvastatin (LIPITOR) 40 MG tablet Take 0.5 tablets (20 mg total) by mouth daily.    . Cyanocobalamin (VITAMIN B-12 PO) Take 1 tablet by mouth daily.    . cycloSPORINE (RESTASIS) 0.05 % ophthalmic emulsion Place 1 drop into both eyes 2 (two) times daily as needed (for eye dryness.).    [name] dexlansoprazole (DEXILANT) 60 MG capsule Take 1 capsule (60 mg total) by mouth daily before breakfast. 90 capsule 3  . glipiZIDE (GLUCOTROL XL) 10 MG 24 hr tablet TAKE ONE TABLET BY MOUTH DAILY. TAKE WITH BREAKFAST. (Patient taking differently: Take 10 mg by mouth daily with breakfast. ) 90 tablet 0  . linagliptin (TRADJENTA) 5 MG TABS tablet Take 1 tablet (5 mg total) by mouth daily. 90 tablet 0  . montelukast (SINGULAIR) 10 MG tablet Take 1 tablet (10 mg total) by mouth at bedtime as needed (for allergies.). (Patient not taking: Reported on 08/28/2019) 90 tablet 0  . PARoxetine (PAXIL) 40 MG tablet TAKE ONE TABLET BY MOUTH EVERY MORNING. (Patient taking differently: Take 40 mg by mouth daily. ) 90 tablet 0  . verapamil (CALAN-SR) 240 MG CR tablet TAKE ONE TABLET BY MOUTH EVERY EVENING. (Patient taking differently: Take 240 mg by mouth daily after breakfast. ) 90 tablet 0   No current facility-administered medications for this visit.     REVIEW OF SYSTEMS:   Constitutional: Denies fevers, chills or abnormal night sweats Eyes: Denies blurriness of vision,  double vision or watery eyes Ears, nose, mouth, throat, and face: Denies mucositis or sore throat Respiratory: Denies cough, dyspnea or wheezes Cardiovascular: Denies palpitation, chest discomfort or lower extremity swelling Gastrointestinal:  Denies nausea, heartburn or change in bowel habits Skin: Denies abnormal skin rashes Lymphatics: Denies new lymphadenopathy or easy bruising Neurological:Denies numbness, tingling or new weaknesses Behavioral/Psych: Mood is stable, no new changes  All other systems were reviewed with the patient and are negative.  PHYSICAL EXAMINATION: ECOG PERFORMANCE STATUS: 1 - Symptomatic but completely ambulatory  Vitals:   08/29/19 1007  BP: (!) 146/78  Pulse: 64  Resp: 20  Temp: (!) 97.5 F (36.4 C)  SpO2:  100%   Filed Weights   08/29/19 1007  Weight: 191 lb 11.2 oz (87 kg)    GENERAL:alert, no distress and comfortable SKIN: skin color, texture, turgor are normal, no rashes or significant lesions EYES: normal, conjunctiva are pink and non-injected, sclera clear OROPHARYNX:no exudate, no erythema and lips, buccal mucosa, and tongue normal  NECK: supple, thyroid normal size, non-tender, without nodularity LYMPH:  no palpable lymphadenopathy in the cervical, axillary or inguinal LUNGS: clear to auscultation and percussion with normal breathing effort HEART: regular rate & rhythm and no murmurs and no lower extremity edema ABDOMEN:abdomen soft, non-tender and normal bowel sounds Musculoskeletal:no cyanosis of digits and no clubbing  PSYCH: alert & oriented x 3 with fluent speech NEURO: no focal motor/sensory deficits  LABORATORY DATA:  I have reviewed the data as listed Recent Results (from the past 2160 hour(s))  Basic metabolic panel     Status: Abnormal   Collection Time: 07/30/19  8:58 PM  Result Value Ref Range   Sodium 138 135 - 145 mmol/L   Potassium 4.1 3.5 - 5.1 mmol/L   Chloride 102 98 - 111 mmol/L   CO2 23 22 - 32 mmol/L    Glucose, Bld 175 (H) 70 - 99 mg/dL   BUN 59 (H) 8 - 23 mg/dL   Creatinine, Ser 3.61 (H) 0.44 - 1.00 mg/dL   Calcium 10.1 8.9 - 10.3 mg/dL   GFR calc non Af Amer 13 (L) >60 mL/min   GFR calc Af Amer 15 (L) >60 mL/min   Anion gap 13 5 - 15    Comment: Performed at [facility], [address], [place],  [address]  CBC with Differential     Status: Abnormal   Collection Time: 07/30/19  8:58 PM  Result Value Ref Range   WBC 7.8 4.0 - 10.5 K/uL   RBC 4.82 3.87 - 5.11 MIL/uL   Hemoglobin 13.2 12.0 - 15.0 g/dL   HCT 43.2 36.0 - 46.0 %   MCV 89.6 80.0 - 100.0 fL   MCH 27.4 26.0 - 34.0 pg   MCHC 30.6 30.0 - 36.0 g/dL   RDW 12.4 11.5 - 15.5 %   Platelets 97 (L) 150 - 400 K/uL    Comment: PLATELET COUNT CONFIRMED BY SMEAR Immature Platelet Fraction may be clinically indicated, consider ordering this additional test [id number]    nRBC 0.0 0.0 - 0.2 %   Neutrophils Relative % 57 %   Neutro Abs 4.4 1.7 - 7.7 K/uL   Lymphocytes Relative 26 %   Lymphs Abs 2.1 0.7 - 4.0 K/uL   Monocytes Relative 9 %   Monocytes Absolute 0.7 0.1 - 1.0 K/uL   Eosinophils Relative 7 %   Eosinophils Absolute 0.6 (H) 0.0 - 0.5 K/uL   Basophils Relative 1 %   Basophils Absolute 0.1 0.0 - 0.1 K/uL   Immature Granulocytes 0 %   Abs Immature Granulocytes 0.03 0.00 - 0.07 K/uL    Comment: Performed at [facility], [address], [place], [place] [address]  Hemoglobin A1c     Status: Abnormal   Collection Time: 07/30/19  8:58 PM  Result Value Ref Range   Hgb A1c MFr Bld 8.1 (H) 4.8 - 5.6 %    Comment: (NOTE) Pre diabetes:          5.7%-6.4% Diabetes:              >6.4% Glycemic control for   <7.0% adults with diabetes    Mean Plasma Glucose  185.77 mg/dL    Comment: Performed at [facility], [place] [address], [place], [place] [address]  CBG monitoring, ED     Status: Abnormal   Collection Time: 07/30/19  9:24 PM  Result Value Ref Range   Glucose-Capillary 157 (H) 70 - 99 mg/dL  SARS Coronavirus 2  by RT PCR (hospital order, performed in [facility] hospital lab) Nasopharyngeal Nasopharyngeal Swab     Status: None   Collection Time: 07/30/19 11:34 PM   Specimen: Nasopharyngeal Swab  Result Value Ref Range   SARS Coronavirus 2 NEGATIVE NEGATIVE    Comment: (NOTE) If result is NEGATIVE SARS-CoV-2 target nucleic acids are NOT DETECTED. The SARS-CoV-2 RNA is generally detectable in upper and lower  respiratory specimens during the acute phase of infection. The lowest  concentration of SARS-CoV-2 viral copies this assay can detect is 250  copies / mL. A negative result does not preclude SARS-CoV-2 infection  and should not be used as the sole basis for treatment or other  patient management decisions.  A negative result may occur with  improper specimen collection / handling, submission of specimen other  than nasopharyngeal swab, presence of viral mutation(s) within the  areas targeted by this assay, and inadequate number of viral copies  (<250 copies / mL). A negative result must be combined with clinical  observations, patient history, and epidemiological information. If result is POSITIVE SARS-CoV-2 target nucleic acids are DETECTED. The SARS-CoV-2 RNA is generally detectable in upper and lower  respiratory specimens dur ing the acute phase of infection.  Positive  results are indicative of active infection with SARS-CoV-2.  Clinical  correlation with patient history and other diagnostic information is  necessary to determine patient infection status.  Positive results do  not rule out bacterial infection or co-infection with other viruses. If result is PRESUMPTIVE POSTIVE SARS-CoV-2 nucleic acids MAY BE PRESENT.   A presumptive positive result was obtained on the submitted specimen  and confirmed on repeat testing.  While 2019 novel coronavirus  (SARS-CoV-2) nucleic acids may be present in the submitted sample  additional confirmatory testing may be necessary for  epidemiological  and / or clinical management purposes  to differentiate between  SARS-CoV-2 and other Sarbecovirus currently known to infect humans.  If clinically indicated additional testing with an alternate test  methodology [id number]) is advised. The SARS-CoV-2 RNA is generally  detectable in upper and lower respiratory sp ecimens during the acute  phase of infection. The expected result is Negative. Fact Sheet for Patients:  StrictlyIdeas.no Fact Sheet for Healthcare Providers: BankingDealers.co.za This test is not yet approved or cleared by the [place] FDA and has been authorized for detection and/or diagnosis of SARS-CoV-2 by FDA under an Emergency Use Authorization (EUA).  This EUA will remain in effect (meaning this test can be used) for the duration of the COVID-19 declaration under Section 564(b)(1) of the Act, 21 U.S.C. section 360bbb-3(b)(1), unless the authorization is terminated or revoked sooner. Performed at [facility], [address], [place], [place] [address]   Urinalysis, Routine w reflex microscopic     Status: Abnormal   Collection Time: 07/31/19  5:30 AM  Result Value Ref Range   Color, Urine STRAW (A) YELLOW   APPearance CLEAR CLEAR   Specific Gravity, Urine 1.011 1.005 - 1.030   pH 5.0 5.0 - 8.0   Glucose, UA NEGATIVE NEGATIVE mg/dL   Hgb urine dipstick NEGATIVE NEGATIVE   Bilirubin Urine NEGATIVE NEGATIVE   Ketones, ur  NEGATIVE NEGATIVE mg/dL   Protein, ur NEGATIVE NEGATIVE mg/dL   Nitrite NEGATIVE NEGATIVE   Leukocytes,Ua SMALL (A) NEGATIVE   RBC / HPF 0-5 0 - 5 RBC/hpf   WBC, UA 0-5 0 - 5 WBC/hpf   Bacteria, UA RARE (A) NONE SEEN   Squamous Epithelial / LPF 0-5 0 - 5    Comment: Performed at [facility], [address], [place], [place] [address]  Glucose, capillary     Status: Abnormal   Collection Time: 07/31/19  7:41 AM  Result Value Ref Range   Glucose-Capillary 137 (H) 70 - 99 mg/dL   CBC     Status: Abnormal   Collection Time: 07/31/19  9:38 AM  Result Value Ref Range   WBC 7.6 4.0 - 10.5 K/uL   RBC 4.35 3.87 - 5.11 MIL/uL   Hemoglobin 12.0 12.0 - 15.0 g/dL   HCT 39.0 36.0 - 46.0 %   MCV 89.7 80.0 - 100.0 fL   MCH 27.6 26.0 - 34.0 pg   MCHC 30.8 30.0 - 36.0 g/dL   RDW 12.4 11.5 - 15.5 %   Platelets 97 (L) 150 - 400 K/uL    Comment: Immature Platelet Fraction may be clinically indicated, consider ordering this additional test [id number]    nRBC 0.0 0.0 - 0.2 %    Comment: Performed at [facility], [address], [place], [place] [address]  Comprehensive metabolic panel     Status: Abnormal   Collection Time: 07/31/19  9:38 AM  Result Value Ref Range   Sodium 138 135 - 145 mmol/L   Potassium 4.4 3.5 - 5.1 mmol/L   Chloride 105 98 - 111 mmol/L   CO2 22 22 - 32 mmol/L   Glucose, Bld 215 (H) 70 - 99 mg/dL   BUN 49 (H) 8 - 23 mg/dL   Creatinine, Ser 2.32 (H) 0.44 - 1.00 mg/dL    Comment: DELTA CHECK NOTED   Calcium 9.8 8.9 - 10.3 mg/dL   Total Protein 7.2 6.5 - 8.1 g/dL   Albumin 3.7 3.5 - 5.0 g/dL   AST 34 15 - 41 U/L   ALT 25 0 - 44 U/L   Alkaline Phosphatase 90 38 - 126 U/L   Total Bilirubin 0.5 0.3 - 1.2 mg/dL   GFR calc non Af Amer 22 (L) >60 mL/min   GFR calc Af Amer 25 (L) >60 mL/min   Anion gap 11 5 - 15    Comment: Performed at [facility], [address] [place], [place] [address]  HIV Antibody (routine testing w rflx)     Status: None   Collection Time: 07/31/19  9:38 AM  Result Value Ref Range   HIV Screen 4th Generation wRfx NON REACTIVE NON REACTIVE    Comment: Performed at [facility], [address], [place], [place] [address]  Hemoglobin A1c     Status: Abnormal   Collection Time: 07/31/19  9:38 AM  Result Value Ref Range   Hgb A1c MFr Bld 8.2 (H) 4.8 - 5.6 %    Comment: (NOTE) Pre diabetes:          5.7%-6.4% Diabetes:              >6.4% Glycemic control for   <7.0% adults with diabetes    Mean Plasma Glucose 188.64  mg/dL    Comment: Performed at [facility] [address] [place], [place] [address]  Glucose, capillary     Status: Abnormal  Collection Time: 07/31/19 11:16 AM  Result Value Ref Range   Glucose-Capillary 222 (H) 70 - 99 mg/dL  Glucose, capillary     Status: Abnormal   Collection Time: 07/31/19  4:13 PM  Result Value Ref Range   Glucose-Capillary 161 (H) 70 - 99 mg/dL  Glucose, capillary     Status: Abnormal   Collection Time: 07/31/19  8:58 PM  Result Value Ref Range   Glucose-Capillary 244 (H) 70 - 99 mg/dL  Basic metabolic panel     Status: Abnormal   Collection Time: 08/01/19  4:23 AM  Result Value Ref Range   Sodium 137 135 - 145 mmol/L   Potassium 4.7 3.5 - 5.1 mmol/L   Chloride 106 98 - 111 mmol/L   CO2 23 22 - 32 mmol/L   Glucose, Bld 242 (H) 70 - 99 mg/dL   BUN 37 (H) 8 - 23 mg/dL   Creatinine, Ser 1.71 (H) 0.44 - 1.00 mg/dL   Calcium 9.4 8.9 - 10.3 mg/dL   GFR calc non Af Amer 32 (L) >60 mL/min   GFR calc Af Amer 37 (L) >60 mL/min   Anion gap 8 5 - 15    Comment: Performed at [facility], [address], [place], [place] [address]  Glucose, capillary     Status: Abnormal   Collection Time: 08/01/19  8:15 AM  Result Value Ref Range   Glucose-Capillary 151 (H) 70 - 99 mg/dL  Glucose, capillary     Status: Abnormal   Collection Time: 08/01/19 11:55 AM  Result Value Ref Range   Glucose-Capillary 205 (H) 70 - 99 mg/dL  CBC with Differential/Platelet     Status: Abnormal   Collection Time: 08/29/19 11:13 AM  Result Value Ref Range   WBC 6.3 4.0 - 10.5 K/uL   RBC 4.34 3.87 - 5.11 MIL/uL   Hemoglobin 11.9 (L) 12.0 - 15.0 g/dL   HCT 39.1 36.0 - 46.0 %   MCV 90.1 80.0 - 100.0 fL   MCH 27.4 26.0 - 34.0 pg   MCHC 30.4 30.0 - 36.0 g/dL   RDW 12.9 11.5 - 15.5 %   Platelets 109 (L) 150 - 400 K/uL    Comment: SPECIMEN CHECKED FOR CLOTS Immature Platelet Fraction may be clinically indicated, consider ordering this additional test [id number] CONSISTENT WITH  PREVIOUS RESULT    nRBC 0.0 0.0 - 0.2 %   Neutrophils Relative % 59 %   Neutro Abs 3.7 1.7 - 7.7 K/uL   Lymphocytes Relative 27 %   Lymphs Abs 1.7 0.7 - 4.0 K/uL   Monocytes Relative 8 %   Monocytes Absolute 0.5 0.1 - 1.0 K/uL   Eosinophils Relative 5 %   Eosinophils Absolute 0.3 0.0 - 0.5 K/uL   Basophils Relative 1 %   Basophils Absolute 0.1 0.0 - 0.1 K/uL   Immature Granulocytes 0 %   Abs Immature Granulocytes 0.02 0.00 - 0.07 K/uL    Comment: Performed at [facility], [address], [place], [place] [address]  Lactate dehydrogenase     Status: None   Collection Time: 08/29/19 11:13 AM  Result Value Ref Range   LDH 187 98 - 192 U/L    Comment: Performed at [facility], [address], [place], [place] [address]  Sedimentation rate     Status: Abnormal   Collection Time: 08/29/19 11:13 AM  Result Value Ref Range   Sed Rate 26 (H) 0 - 22 mm/hr    Comment: Performed at [facility],  [address] [place], [place] [address]  Folate     Status: None   Collection Time: 08/29/19 11:13 AM  Result Value Ref Range   Folate 8.1 >5.9 ng/mL    Comment: Performed at [facility], [address], [place], [place] [address]  Hepatitis B surface antigen     Status: None   Collection Time: 08/29/19 11:13 AM  Result Value Ref Range   Hepatitis B Surface Ag NON REACTIVE NON REACTIVE    Comment: Performed at [facility], [place] [address] [place], [place] [address]  Hepatitis B surface antibody,qualitative     Status: None   Collection Time: 08/29/19 11:13 AM  Result Value Ref Range   Hep B S Ab NON REACTIVE NON REACTIVE    Comment: (NOTE) Inconsistent with immunity, less than 10 mIU/mL. Performed at [facility], [place] [address], [place], [place] [address]   Hepatitis B core antibody, total     Status: None   Collection Time: 08/29/19 11:13 AM  Result Value Ref Range   Hep B Core Total Ab NON REACTIVE NON REACTIVE    Comment: Performed at [facility]  [address] [place], [place] [address]  Hepatitis C Antibody     Status: None   Collection Time: 08/29/19 11:13 AM  Result Value Ref Range   HCV Ab NON REACTIVE NON REACTIVE    Comment: (NOTE) Nonreactive HCV antibody screen is consistent with no HCV infections,  unless recent infection is suspected or other evidence exists to indicate HCV infection. Performed at [facility], [place] [address] [place] [address], [place], [place] [address]   Iron and TIBC     Status: None   Collection Time: 08/29/19 11:14 AM  Result Value Ref Range   Iron 57 28 - 170 ug/dL   TIBC 450 250 - 450 ug/dL   Saturation Ratios 13 10.4 - 31.8 %   UIBC 393 ug/dL    Comment: Performed at [facility], [address] [place], [place] [address]  Ferritin     Status: None   Collection Time: 08/29/19 11:14 AM  Result Value Ref Range   Ferritin 44 11 - 307 ng/mL    Comment: Performed at [facility], [address], [place], [place] [address]  Vitamin B12     Status: Abnormal   Collection Time: 08/29/19 11:14 AM  Result Value Ref Range   Vitamin B-12 1,497 (H) 180 - 914 pg/mL    Comment: (NOTE) This assay is not validated for testing neonatal or myeloproliferative syndrome specimens for Vitamin B12 levels. Performed at [facility], [address] [place],  [address]     RADIOGRAPHIC STUDIES: I have personally reviewed the radiological images as listed and agreed with the findings in the report. Ct Head Wo Contrast  Result Date: 07/30/2019 CLINICAL DATA:  Dizziness, headache, blurred vision EXAM: CT HEAD WITHOUT CONTRAST TECHNIQUE: Contiguous axial images were obtained from the base of the skull through the vertex without intravenous contrast. COMPARISON:  Maxillofacial CT 05/02/2017 FINDINGS: Brain: No evidence of acute infarction, hemorrhage, hydrocephalus, extra-axial collection or mass lesion/mass effect. Vascular: Atherosclerotic calcification of the carotid siphons. No hyperdense vessel. Skull: No calvarial fracture or  suspicious osseous lesion. No scalp swelling or hematoma. Sinuses/Orbits: Paranasal sinuses and mastoid air cells are predominantly clear. Included orbital structures are unremarkable. Other: None IMPRESSION: No acute intracranial abnormality. Electronically Signed   By: Lovena Le M.D.   On: 07/30/2019 22:21   US Renal  Result Date: 07/31/2019 CLINICAL DATA:  Hydronephrosis. EXAM: RENAL / URINARY TRACT ULTRASOUND COMPLETE COMPARISON:  Ultrasound 08/15/2017. FINDINGS: Right Kidney: Renal measurements: 10.6 x 6.4 x 6.1 cm = volume: 215 mL. 1.6 cm simple cyst. No hydronephrosis. Left Kidney: Renal measurements: 9.8 x 5.6 x 5.4 cm = volume: 133 mL. Echogenicity within normal limits. No mass or hydronephrosis visualized. Bladder: Appears normal for degree of bladder distention. No ureteral jets visualized. Other: IMPRESSION: 1. 1.6 cm simple right renal cyst again noted. Similar findings noted on prior exam. 2. Exam otherwise unremarkable. No hydronephrosis. Electronically Signed   By: [place]   On: 07/31/2019 14:08    ASSESSMENT & PLAN:  Thrombocytopenia ([place]) 1.  Mild to moderate thrombocytopenia: -CBC on 08/11/2019 shows platelet count of 108.  Hemoglobin was 11.3 with normal MCV and normal white count. -CBC on 06/16/2019 shows platelet count 116 with hemoglobin 11.7. -I have reviewed CBC on EMR which showed platelet count 97 on 07/31/2019 and 93 on 10/30/2018. -She denies any fevers, night sweats or weight loss. -No easy bruising or bleeding reported. -She is on vitamin B12 injections and also taking B12 pills.  She reported blood transfusion many years ago. -I have recommended CBC with differential, LDH and reticulocyte count.  We will review her smear.  We will check nutritional deficiencies including folic acid, methylmalonic acid, copper, and infectious etiologies including hepatitis panel.  We will also check for ANA and rheumatoid factor.  Will check for serum protein allergy  versus. -Differential diagnosis includes immune mediated thrombocytopenia.  We will see her back in 2 to 3 weeks to discuss results.  2.  Normocytic anemia: -She has mild anemia between 11 and 12.  She denies any bleeding per rectum or melena. -We will check ferritin and iron panel.  We are also checking [id number], folic acid and copper levels.     All questions were answered. The patient knows to call the clinic with any problems, questions or concerns.      Derek Jack, MD 08/29/19 6:02 PM

## 2019-08-29 NOTE — Patient Instructions (Signed)
[facility] Discharge Instructions  You were seen today by Dr. Delton Coombes. He went over your history, family history and how you've been feeling since your recent hospital visit. He will have blood drawn today. He will see you back in 3 weeks for labs and follow up.   Thank you for choosing [place] at [facility] to provide your oncology and hematology care.  To afford each patient quality time with our provider, please arrive at least 15 minutes before your scheduled appointment time.   If you have a lab appointment with the [facility] please come in thru the  Main Entrance and check in at the main information desk  You need to re-schedule your appointment should you arrive 10 or more minutes late.  We strive to give you quality time with our providers, and arriving late affects you and other patients whose appointments are after yours.  Also, if you no show three or more times for appointments you may be dismissed from the clinic at the providers discretion.     Again, thank you for choosing [facility].  Our hope is that these requests will decrease the amount of time that you wait before being seen by our physicians.       _____________________________________________________________  Should you have questions after your visit to [facility], please contact our office at [phone] between the hours of 8:00 a.m. and 4:30 p.m.  Voicemails left after 4:00 p.m. will not be returned until the following business day.  For prescription refill requests, have your pharmacy contact our office and allow 72 hours.    Cancer Center Support Programs:   > Cancer Support Group  2nd Tuesday of the month 1pm-2pm, Journey Room

## 2019-09-01 LAB — PATHOLOGIST SMEAR REVIEW

## 2019-09-01 LAB — PROTEIN ELECTROPHORESIS, SERUM
A/G Ratio: 1.1 (ref 0.7–1.7)
Albumin ELP: 3.6 g/dL (ref 2.9–4.4)
Alpha-1-Globulin: 0.3 g/dL (ref 0.0–0.4)
Alpha-2-Globulin: 0.9 g/dL (ref 0.4–1.0)
Beta Globulin: 1.2 g/dL (ref 0.7–1.3)
Gamma Globulin: 1 g/dL (ref 0.4–1.8)
Globulin, Total: 3.3 g/dL (ref 2.2–3.9)
Total Protein ELP: 6.9 g/dL (ref 6.0–8.5)

## 2019-09-02 LAB — COPPER, SERUM: Copper: 116 ug/dL (ref 72–166)

## 2019-09-04 LAB — METHYLMALONIC ACID, SERUM: Methylmalonic Acid, Quantitative: 114 nmol/L (ref 0–378)

## 2019-09-18 ENCOUNTER — Encounter: Payer: Self-pay | Admitting: *Deleted

## 2019-09-18 ENCOUNTER — Ambulatory Visit (INDEPENDENT_AMBULATORY_CARE_PROVIDER_SITE_OTHER): Payer: Medicare Other | Admitting: Gastroenterology

## 2019-09-18 ENCOUNTER — Telehealth: Payer: Self-pay

## 2019-09-18 ENCOUNTER — Encounter: Payer: Self-pay | Admitting: Gastroenterology

## 2019-09-18 ENCOUNTER — Other Ambulatory Visit: Payer: Self-pay

## 2019-09-18 VITALS — BP 173/93 | HR 73 | Temp 96.6°F | Ht 61.0 in | Wt 197.2 lb

## 2019-09-18 DIAGNOSIS — K219 Gastro-esophageal reflux disease without esophagitis: Secondary | ICD-10-CM | POA: Diagnosis not present

## 2019-09-18 DIAGNOSIS — R1310 Dysphagia, unspecified: Secondary | ICD-10-CM

## 2019-09-18 DIAGNOSIS — R1319 Other dysphagia: Secondary | ICD-10-CM

## 2019-09-18 MED ORDER — DEXILANT 60 MG PO CPDR: DELAYED_RELEASE_CAPSULE | ORAL | 3 refills | Status: DC

## 2019-09-18 NOTE — Assessment & Plan Note (Signed)
SYMPTOMS FAIRLY WELL CONTROLLED.  CONTINUE YOUR WEIGHT LOSS EFFORTS. AVOID REFLUX TRIGGERS. CONTINUE DEXILANT. FOLLOW UP IN 6 MOS.

## 2019-09-18 NOTE — Assessment & Plan Note (Signed)
SYMPTOMS NOT IDEALLY CONTROLLED.  DISCUSSED PROCEDURE, BENEFITS, & RISKS for evaluating dysphagia. PT ELECTED TO HAVE BARIUM PILL ESOPHAGRAM. CONSIDER ADDITIONAL EVALUATION IF POSITIVE. FOLLOW UP IN 6 MOS.

## 2019-09-18 NOTE — Telephone Encounter (Signed)
Opened in error

## 2019-09-18 NOTE — Patient Instructions (Signed)
CONTINUE YOUR WEIGHT LOSS EFFORTS.  AVOID REFLUX TRIGGERS. SEE INFO BELOW.   CONTINUE DEXILANT.   COMPLETE BARIUM SWALLOWING STUDIES.  FOLLOW UP IN 6 MOS.   CALL [facility] FOR A PRIMARY CARE APPOINT MENT FOR YOUR SON: [phone].   Lifestyle and home remedies TO MANAGE REFLUX/CHEST PAIN  You may eliminate or reduce the frequency of heartburn by making the following lifestyle changes:  . Control your weight. Being overweight is a major risk factor for heartburn and GERD. Excess pounds put pressure on your abdomen, pushing up your stomach and causing acid to back up into your esophagus.   . Eat smaller meals. 4 TO 6 MEALS A DAY. This reduces pressure on the lower esophageal sphincter, helping to prevent the valve from opening and acid from washing back into your esophagus.   Dolphus Jenny your belt. Clothes that fit tightly around your waist put pressure on your abdomen and the lower esophageal sphincter.   . Eliminate heartburn triggers. Everyone has specific triggers. Common triggers such as fatty or fried foods, spicy food, tomato sauce, carbonated beverages, alcohol, chocolate, mint, garlic, onion, caffeine and nicotine may make heartburn worse.   [name] Avoid stooping or bending. Tying your shoes is OK. Bending over for longer periods to weed your garden isn't, especially soon after eating.   . Don't lie down after a meal. Wait at least three to four hours after eating before going to bed, and don't lie down right after eating.   [name] SLEEP WITH ON A WEDGE OR PUT THE HEAD OF YOUR BED ON 6 INCH BLOCKS TO KEEP YOUR HEAD ABOVE YOUR HEART WHILE YOU SLEEP.   Alternative medicine . Several home remedies exist for treating GERD, but they provide only temporary relief. They include drinking baking soda (sodium bicarbonate) added to water or drinking other fluids such as baking soda mixed with cream of tartar and water.  . Although these liquids create temporary relief by  neutralizing, washing away or buffering acids, eventually they aggravate the situation by adding gas and fluid to your stomach, increasing pressure and causing more acid reflux. Further, adding more sodium to your diet may increase your blood pressure and add stress to your heart, and excessive bicarbonate ingestion can alter the acid-base balance in your body.

## 2019-09-18 NOTE — Progress Notes (Signed)
Subjective:    Patient ID: Barbara House, female    DOB: 08-27-1956, 63 y.o.   MRN: [id number] Caren Macadam, MD  HPI Swallowing problems: noticed this week. HAD HEARTBURN: 2-3 MOS AGO. HAD SUBJECTIVE CHILLS. TAKES A WHILE. BMs: 1-2X/DAY.   MAY HAVE DIARRHEA AFTER MEDICATIONS: 1-2X TIMES AND THAT'S IT.  MAY HAVE CHEST PAIN AND R ARM PAIN AND NOW DOING PT.KEEPS SINUS.  PT DENIES FEVER, CHILLS, HEMATOCHEZIA, HEMATEMESIS, nausea, vomiting, melena, SHORTNESS OF BREATH, CHANGE IN BOWEL IN HABITS, constipation, abdominal pain, problems swallowing, OR heartburn or indigestion.  Past Medical History:  Diagnosis Date  . Allergic rhinitis   . Anxiety disorder   . Arthritis   . GERD (gastroesophageal reflux disease)   . Headache   . History of migraine   . Hypertension   . Type 2 diabetes mellitus ([place])    Not sure when she was diagnosed, greater than five years.    Past Surgical History:  Procedure Laterality Date  . ABDOMINAL HYSTERECTOMY     has ovaries.   [name] CARDIAC CATHETERIZATION N/A 08/03/2016   Procedure: Left Heart Cath and Coronary Angiography;  Surgeon: Peter M Martinique, MD;  Location: [facility];  Service: Cardiovascular;  Laterality: N/A;  . COLONOSCOPY WITH PROPOFOL N/A 11/05/2018   Dr. Oneida Alar: int/ext hemorrhoids, 55mm cecal tubular adenoma removed, next TCS in 5-10 years  . ESOPHAGOGASTRODUODENOSCOPY (EGD) WITH PROPOFOL N/A 11/05/2018   Dr. Oneida Alar: esophagus was dilated, mild gastritis with benign bx  . Miinor skin surgery    . POLYPECTOMY  11/05/2018   Procedure: POLYPECTOMY;  Surgeon: Danie Binder, MD;  [facility];  Service: Endoscopy;;  cecal polyp  . SAVORY DILATION N/A 11/05/2018   Procedure: SAVORY DILATION;  Surgeon: Danie Binder, MD;  [facility];  Service: Endoscopy;  Laterality: N/A;   Allergies  Allergen Reactions  . Prednisone Other (See Comments)    Hair loss    Current Outpatient Medications  Medication Sig    .  atorvastatin (LIPITOR) 40 MG tablet Take 0.5 tablets (20 mg total) by mouth daily.    . Cyanocobalamin (VITAMIN B-12 PO) Take 1 tablet by mouth daily.    . cycloSPORINE (RESTASIS) 0.05 % ophthalmic emulsion Place 1 drop into both eyes 2 (two) times daily as needed (for eye dryness.).    [name] dexlansoprazole (DEXILANT) 60 MG capsule Take 1 capsule (60 mg total) by mouth daily before breakfast.    . glipiZIDE (GLUCOTROL XL) 10 MG 24 hr tablet TAKE ONE TABLET BY MOUTH DAILY. TAKE WITH BREAKFAST. (Patient taking differently: Take 10 mg by mouth daily with breakfast. )    . metFORMIN (GLUCOPHAGE) 500 MG tablet Take 2  by mouth daily with breakfast.    . montelukast (SINGULAIR) 10 MG tablet Take 1 tablet (10 mg total) by mouth at bedtime as needed (for allergies.).    [name] PARoxetine (PAXIL) 40 MG tablet TAKE ONE TABLET BY MOUTH EVERY MORNING. (Patient taking differently: Take 40 mg by mouth daily. )    . verapamil (CALAN-SR) 240 MG CR tablet TAKE ONE TABLET BY MOUTH EVERY EVENING. (Patient taking differently: Take 240 mg by mouth daily after breakfast. )    .        Review of Systems PER HPI OTHERWISE ALL SYSTEMS ARE NEGATIVE.    Objective:   Physical Exam Constitutional:      General: She is not in acute distress.    Appearance: Normal appearance.  HENT:  Mouth/Throat:     Comments: MASK IN PLACE Eyes:     General: No scleral icterus.    Pupils: Pupils are equal, round, and reactive to light.  Neck:     Musculoskeletal: Normal range of motion.  Cardiovascular:     Rate and Rhythm: Normal rate and regular rhythm.     Pulses: Normal pulses.     Heart sounds: Normal heart sounds.  Pulmonary:     Effort: Pulmonary effort is normal.     Breath sounds: Normal breath sounds.  Abdominal:     General: Bowel sounds are normal.     Palpations: Abdomen is soft.     Tenderness: There is no abdominal tenderness.  Musculoskeletal:     Right lower leg: Edema present.     Left lower leg: Edema  present.  Lymphadenopathy:     Cervical: No cervical adenopathy.  Skin:    General: Skin is warm and dry.  Neurological:     Mental Status: She is alert and oriented to person, place, and time.     Comments: NO  NEW FOCAL DEFICITS  Psychiatric:        Mood and Affect: Mood normal.     Comments: NORMAL AFFECT       Assessment & Plan:

## 2019-09-22 ENCOUNTER — Other Ambulatory Visit: Payer: Self-pay | Admitting: Allergy

## 2019-09-23 ENCOUNTER — Inpatient Hospital Stay (HOSPITAL_COMMUNITY): Payer: Medicare Other | Attending: Hematology | Admitting: Hematology

## 2019-09-24 ENCOUNTER — Ambulatory Visit (HOSPITAL_COMMUNITY): Payer: Medicare Other

## 2019-09-29 ENCOUNTER — Other Ambulatory Visit: Payer: Self-pay

## 2019-09-29 ENCOUNTER — Ambulatory Visit (HOSPITAL_COMMUNITY)
Admission: RE | Admit: 2019-09-29 | Discharge: 2019-09-29 | Disposition: A | Discharge status: Home / Self Care | Payer: Medicare Other | Source: Ambulatory Visit | Attending: Gastroenterology | Admitting: Gastroenterology

## 2019-09-29 DIAGNOSIS — R1310 Dysphagia, unspecified: Secondary | ICD-10-CM | POA: Diagnosis present

## 2019-09-29 DIAGNOSIS — R1319 Other dysphagia: Secondary | ICD-10-CM

## 2019-10-02 ENCOUNTER — Telehealth: Payer: Self-pay | Admitting: Gastroenterology

## 2019-10-02 NOTE — Telephone Encounter (Signed)
PLEASE CALL PT. HER BARIUM PILL ESOPHAGRAM IS NORMAL.  SHE SHOULD:   1.CONTINUE YOUR WEIGHT LOSS EFFORTS.  2. AVOID REFLUX TRIGGERS.   3. CONTINUE DEXILANT.  FOLLOW UP IN 6 MOS.

## 2019-10-02 NOTE — Telephone Encounter (Signed)
Tried calling pt. No VM, will call pt back.  

## 2019-10-03 ENCOUNTER — Telehealth: Payer: Self-pay | Admitting: Internal Medicine

## 2019-10-03 NOTE — Telephone Encounter (Signed)
Spoke with pt. Pt notified of results.  

## 2019-10-03 NOTE — Telephone Encounter (Signed)
Pt was returning call. [phone]

## 2019-10-07 NOTE — Telephone Encounter (Signed)
REMINDER IN EPIC °

## 2019-12-16 ENCOUNTER — Inpatient Hospital Stay (HOSPITAL_COMMUNITY): Payer: Medicare Other | Attending: Hematology | Admitting: Nurse Practitioner

## 2019-12-16 ENCOUNTER — Other Ambulatory Visit: Payer: Self-pay

## 2019-12-16 ENCOUNTER — Encounter (HOSPITAL_COMMUNITY): Payer: Self-pay | Admitting: Nurse Practitioner

## 2019-12-16 VITALS — BP 115/53 | HR 56 | Temp 97.3°F | Resp 17 | Wt 202.4 lb

## 2019-12-16 DIAGNOSIS — F419 Anxiety disorder, unspecified: Secondary | ICD-10-CM | POA: Insufficient documentation

## 2019-12-16 DIAGNOSIS — Z79899 Other long term (current) drug therapy: Secondary | ICD-10-CM | POA: Insufficient documentation

## 2019-12-16 DIAGNOSIS — Z833 Family history of diabetes mellitus: Secondary | ICD-10-CM | POA: Insufficient documentation

## 2019-12-16 DIAGNOSIS — I10 Essential (primary) hypertension: Secondary | ICD-10-CM | POA: Insufficient documentation

## 2019-12-16 DIAGNOSIS — Z7984 Long term (current) use of oral hypoglycemic drugs: Secondary | ICD-10-CM | POA: Insufficient documentation

## 2019-12-16 DIAGNOSIS — D696 Thrombocytopenia, unspecified: Secondary | ICD-10-CM | POA: Diagnosis present

## 2019-12-16 DIAGNOSIS — Z8249 Family history of ischemic heart disease and other diseases of the circulatory system: Secondary | ICD-10-CM | POA: Diagnosis not present

## 2019-12-16 DIAGNOSIS — D649 Anemia, unspecified: Secondary | ICD-10-CM | POA: Insufficient documentation

## 2019-12-16 DIAGNOSIS — Z90710 Acquired absence of both cervix and uterus: Secondary | ICD-10-CM | POA: Insufficient documentation

## 2019-12-16 DIAGNOSIS — E119 Type 2 diabetes mellitus without complications: Secondary | ICD-10-CM | POA: Insufficient documentation

## 2019-12-16 DIAGNOSIS — Z809 Family history of malignant neoplasm, unspecified: Secondary | ICD-10-CM | POA: Diagnosis not present

## 2019-12-16 NOTE — Assessment & Plan Note (Signed)
1.  Mild to moderate thrombocytopenia: -Initial CBC on 08/11/2019 shows platelet count 108.  Hemoglobin 11.3 with normal MCV and normal white count. -It was documented on EMR that her platelet count was 97 on 07/31/2019.  And 93 on 10/30/2018. -She denies any fevers, night sweats, chills or unexplained weight loss. -No easy bruising or bleeding. -She reported she did have one blood transfusion that was many many years ago.  She is unsure of the reason. -She is on vitamin B12 injections and also taking vitamin B12 pills. -Initial work-up was negative.  SPEP was normal.  Hepatitis panel was normal.  Ferritin was low at 44. -We will check an ANA and rheumatoid factor on her next visit. -Labs done on 08/28/2020 showed WBC 6.3, hemoglobin 11.9 and platelets 109. -No intervention needed at this time. -She will follow-up in 3 months with repeat labs.  2.  Normocytic anemia: -She has mild anemia between 11 and 12. -She denies any bright red bleeding per rectum or melena. -Labs done on 08/28/2020 showed hemoglobin 11.9, ferritin 44, percent saturation 13, vitamin B12 1497. -She is taking vitamin B12 injections monthly and oral vitamin B12. -She does not need any iron infusion at this time. -We will recheck her labs at her next visit.

## 2019-12-16 NOTE — Patient Instructions (Signed)
[facility] Discharge Instructions  Follow up in 3 months with lab s   Thank you for choosing [facility] at [facility] to provide your oncology and hematology care.  To afford each patient quality time with our provider, please arrive at least 15 minutes before your scheduled appointment time.   If you have a lab appointment with the Cancer Center please come in thru the Main Entrance and check in at the main information desk.  You need to re-schedule your appointment should you arrive 10 or more minutes late.  We strive to give you quality time with our providers, and arriving late affects you and other patients whose appointments are after yours.  Also, if you no show three or more times for appointments you may be dismissed from the clinic at the providers discretion.     Again, thank you for choosing [facility].  Our hope is that these requests will decrease the amount of time that you wait before being seen by our physicians.       _____________________________________________________________  Should you have questions after your visit to [facility], please contact our office at [phone] between the hours of 8:00 a.m. and 4:30 p.m.  Voicemails left after 4:00 p.m. will not be returned until the following business day.  For prescription refill requests, have your pharmacy contact our office and allow 72 hours.    Due to Covid, you will need to wear a mask upon entering the hospital. If you do not have a mask, a mask will be given to you at the Main Entrance upon arrival. For doctor visits, patients may have 1 support person with them. For treatment visits, patients can not have anyone with them due to social distancing guidelines and our immunocompromised population.      

## 2019-12-16 NOTE — Progress Notes (Signed)
[address], [place] [address]   CLINIC:  Medical Oncology/Hematology  PCP:  Caren Macadam, [place] [address] [phone]   REASON FOR VISIT: Follow-up for mild to moderate thrombocytopenia and normocytic anemia  CURRENT THERAPY: Observation   INTERVAL HISTORY:  Ms. Alber 64 y.o. female returns for routine follow-up for mild to moderate thrombocytopenia and normocytic anemia.  Patient denies any extreme fatigue.  Patient denies any bright red bleeding per rectum or melena.  Patient denies any easy bruising or bleeding.  Patient denies any petechiae rash. Denies any nausea, vomiting, or diarrhea. Denies any new pains. Had not noticed any recent bleeding such as epistaxis, hematuria or hematochezia. Denies recent chest pain on exertion, shortness of breath on minimal exertion, pre-syncopal episodes, or palpitations. Denies any numbness or tingling in hands or feet. Denies any recent fevers, infections, or recent hospitalizations. Patient reports appetite at 100% and energy level at 100%.  She is eating well maintain her weight at this time.     REVIEW OF SYSTEMS:  Review of Systems  Neurological: Positive for dizziness.  Psychiatric/Behavioral: Positive for sleep disturbance.  All other systems reviewed and are negative.    PAST MEDICAL/SURGICAL HISTORY:  Past Medical History:  Diagnosis Date  . Allergic rhinitis   . Anxiety disorder   . Arthritis   . GERD (gastroesophageal reflux disease)   . Headache   . History of migraine   . Hypertension   . Thrombocytopenia ([place])   . Type 2 diabetes mellitus ([place])    Not sure when she was diagnosed, greater than five years.    Past Surgical History:  Procedure Laterality Date  . ABDOMINAL HYSTERECTOMY     has ovaries.   [name] CARDIAC CATHETERIZATION N/A 08/03/2016   Procedure: Left Heart Cath and Coronary Angiography;  Surgeon: Peter M Martinique, MD;  Location: [facility];  Service: Cardiovascular;  Laterality: N/A;  . COLONOSCOPY WITH PROPOFOL N/A 11/05/2018   Dr. Oneida Alar: int/ext hemorrhoids, 40mm cecal tubular adenoma removed, next TCS in 5-10 years  . ESOPHAGOGASTRODUODENOSCOPY (EGD) WITH PROPOFOL N/A 11/05/2018   Dr. Oneida Alar: esophagus was dilated, mild gastritis with benign bx  . Miinor skin surgery    . POLYPECTOMY  11/05/2018   Procedure: POLYPECTOMY;  Surgeon: Danie Binder, MD;  [facility];  Service: Endoscopy;;  cecal polyp  . SAVORY DILATION N/A 11/05/2018   Procedure: SAVORY DILATION;  Surgeon: Danie Binder, MD;  [facility];  Service: Endoscopy;  Laterality: N/A;     SOCIAL HISTORY:  Social History   Socioeconomic History  . Marital status: Single    Spouse name: Not on file  . Number of children: 1  . Years of education: Not on file  . Highest education level: Not on file  Occupational History  . Not on file  Tobacco Use  . Smoking status: Never Smoker  . Smokeless tobacco: Never Used  Substance and Sexual Activity  . Alcohol use: No    Alcohol/week: 0.0 standard drinks  . Drug use: No  . Sexual activity: Not Currently    Birth control/protection: Surgical  Other Topics Concern  . Not on file  Social History Narrative   Reports is on disability. Has one son. Lives in [place]. Enjoys time with family. Enjoys Firefighter. Attends church.    Social Determinants of Health   Financial Resource Strain:   . Difficulty of Paying Living Expenses: Not  on file  Food Insecurity:   . Worried About Charity fundraiser in the Last Year: Not on file  . Ran Out of Food in the Last Year: Not on file  Transportation Needs:   . Lack of Transportation (Medical): Not on file  . Lack of Transportation (Non-Medical): Not on file  Physical Activity:   . Days of Exercise per Week: Not on file  . Minutes of Exercise per Session: Not on file  Stress:   . Feeling of Stress : Not on file  Social Connections:     . Frequency of Communication with Friends and Family: Not on file  . Frequency of Social Gatherings with Friends and Family: Not on file  . Attends Religious Services: Not on file  . Active Member of Clubs or Organizations: Not on file  . Attends Archivist Meetings: Not on file  . Marital Status: Not on file  Intimate Partner Violence:   . Fear of Current or Ex-Partner: Not on file  . Emotionally Abused: Not on file  . Physically Abused: Not on file  . Sexually Abused: Not on file    FAMILY HISTORY:  Family History  Problem Relation Age of Onset  . Hypertension Mother   . Diabetes Mother   . Hypertension Son   . Diabetes Son   . Cancer Father   . Cirrhosis Brother   . Alcohol abuse Brother   . Colon cancer Neg Hx     CURRENT MEDICATIONS:  Outpatient Encounter Medications as of 12/16/2019  Medication Sig  . atorvastatin (LIPITOR) 40 MG tablet Take 0.5 tablets (20 mg total) by mouth daily.  . Cyanocobalamin (VITAMIN B-12 PO) Take 1 tablet by mouth daily.  [name] dexlansoprazole (DEXILANT) 60 MG capsule 1 PO EVERY MORNING WITH BREAKFAST.  [name] gabapentin (NEURONTIN) 300 MG capsule Take 300 mg by mouth at bedtime.  [name] glipiZIDE (GLUCOTROL XL) 10 MG 24 hr tablet TAKE ONE TABLET BY MOUTH DAILY. TAKE WITH BREAKFAST. (Patient taking differently: Take 10 mg by mouth daily with breakfast. )  . losartan (COZAAR) 50 MG tablet Take 50 mg by mouth daily.  . montelukast (SINGULAIR) 10 MG tablet TAKE 1 TABLET (10 MG TOTAL) BY MOUTH AT BEDTIME AS NEEDED (FOR ALLERGIES.).  [name] PARoxetine (PAXIL) 40 MG tablet TAKE ONE TABLET BY MOUTH EVERY MORNING. (Patient taking differently: Take 40 mg by mouth daily. )  . sitaGLIPtin-metformin (JANUMET) 50-500 MG tablet Take 1 tablet by mouth daily.  . verapamil (CALAN-SR) 240 MG CR tablet TAKE ONE TABLET BY MOUTH EVERY EVENING. (Patient taking differently: Take 240 mg by mouth daily after breakfast. )  . [DISCONTINUED] metFORMIN (GLUCOPHAGE) 500 MG tablet  Take 1,000 mg by mouth 2 (two) times daily with a meal. Per Tommy at [facility] pt is supposed to be taking 1000 mg bid  . cycloSPORINE (RESTASIS) 0.05 % ophthalmic emulsion Place 1 drop into both eyes 2 (two) times daily as needed (for eye dryness.).  . [DISCONTINUED] omeprazole (PRILOSEC) 20 MG capsule omeprazole 20 mg capsule,delayed release  Take 1 capsule every day by oral route.   No facility-administered encounter medications on file as of 12/16/2019.    ALLERGIES:  Allergies  Allergen Reactions  . Prednisone Other (See Comments)    Hair loss     PHYSICAL EXAM:  ECOG Performance status: 1  Vitals:   12/16/19 1140  BP: (!) 115/53  Pulse: (!) 56  Resp: 17  Temp: (!) 97.3 F (36.3 C)  SpO2: 100%  Filed Weights   12/16/19 1140  Weight: 202 lb 6.4 oz (91.8 kg)    Physical Exam Constitutional:      Appearance: Normal appearance. She is normal weight.  Cardiovascular:     Rate and Rhythm: Normal rate and regular rhythm.     Heart sounds: Normal heart sounds.  Pulmonary:     Effort: Pulmonary effort is normal.     Breath sounds: Normal breath sounds.  Abdominal:     General: Bowel sounds are normal.     Palpations: Abdomen is soft.  Musculoskeletal:        General: Normal range of motion.  Skin:    General: Skin is warm.  Neurological:     Mental Status: She is alert and oriented to person, place, and time. Mental status is at baseline.  Psychiatric:        Mood and Affect: Mood normal.        Behavior: Behavior normal.        Thought Content: Thought content normal.        Judgment: Judgment normal.      LABORATORY DATA:  I have reviewed the labs as listed.  CBC    Component Value Date/Time   WBC 6.3 08/29/2019 1113   RBC 4.34 08/29/2019 1113   HGB 11.9 (L) 08/29/2019 1113   HGB 11.7 06/25/2018 1548   HCT 39.1 08/29/2019 1113   HCT 34.7 06/25/2018 1548   PLT 109 (L) 08/29/2019 1113   PLT 165 06/25/2018 1548   MCV 90.1 08/29/2019 1113   MCV 85  06/25/2018 1548   MCH 27.4 08/29/2019 1113   MCHC 30.4 08/29/2019 1113   RDW 12.9 08/29/2019 1113   RDW 13.5 06/25/2018 1548   LYMPHSABS 1.7 08/29/2019 1113   LYMPHSABS 2.4 06/25/2018 1548   MONOABS 0.5 08/29/2019 1113   EOSABS 0.3 08/29/2019 1113   EOSABS 0.3 06/25/2018 1548   BASOSABS 0.1 08/29/2019 1113   BASOSABS 0.0 06/25/2018 1548   CMP Latest Ref Rng & Units 08/01/2019 07/31/2019 07/30/2019  Glucose 70 - 99 mg/dL 242(H) 215(H) 175(H)  BUN 8 - 23 mg/dL 37(H) 49(H) 59(H)  Creatinine 0.44 - 1.00 mg/dL 1.71(H) 2.32(H) 3.61(H)  Sodium 135 - 145 mmol/L 137 138 138  Potassium 3.5 - 5.1 mmol/L 4.7 4.4 4.1  Chloride 98 - 111 mmol/L 106 105 102  CO2 22 - 32 mmol/L 23 22 23   Calcium 8.9 - 10.3 mg/dL 9.4 9.8 10.1  Total Protein 6.5 - 8.1 g/dL - 7.2 -  Total Bilirubin 0.3 - 1.2 mg/dL - 0.5 -  Alkaline Phos 38 - 126 U/L - 90 -  AST 15 - 41 U/L - 34 -  ALT 0 - 44 U/L - 25 -     I personally performed a face-to-face visit.  All questions were answered to patient's stated satisfaction. Encouraged patient to call with any new concerns or questions before his next visit to the cancer center and we can certain see him sooner, if needed.     ASSESSMENT & PLAN:   Thrombocytopenia ([place]) 1.  Mild to moderate thrombocytopenia: -Initial CBC on 08/11/2019 shows platelet count 108.  Hemoglobin 11.3 with normal MCV and normal white count. -It was documented on EMR that her platelet count was 97 on 07/31/2019.  And 93 on 10/30/2018. -She denies any fevers, night sweats, chills or unexplained weight loss. -No easy bruising or bleeding. -She reported she did have one blood transfusion that was many many years ago.  She  is unsure of the reason. -She is on vitamin B12 injections and also taking vitamin B12 pills. -Initial work-up was negative.  SPEP was normal.  Hepatitis panel was normal.  Ferritin was low at 44. -We will check an ANA and rheumatoid factor on her next visit. -Labs done on  08/28/2020 showed WBC 6.3, hemoglobin 11.9 and platelets 109. -No intervention needed at this time. -She will follow-up in 3 months with repeat labs.  2.  Normocytic anemia: -She has mild anemia between 11 and 12. -She denies any bright red bleeding per rectum or melena. -Labs done on 08/28/2020 showed hemoglobin 11.9, ferritin 44, percent saturation 13, vitamin B12 1497. -She is taking vitamin B12 injections monthly and oral vitamin B12. -She does not need any iron infusion at this time. -We will recheck her labs at her next visit.      Orders placed this encounter:  Orders Placed This Encounter  Procedures  . ANA, IFA (with reflex)  . Rheumatoid factor  . CBC with Differential/Platelet  . Comprehensive metabolic panel      Francene Finders, FNP-C [place] [phone]

## 2020-02-12 ENCOUNTER — Ambulatory Visit: Payer: Medicare Other | Admitting: Gastroenterology

## 2020-02-17 ENCOUNTER — Ambulatory Visit: Payer: Medicare Other | Admitting: Gastroenterology

## 2020-03-02 ENCOUNTER — Ambulatory Visit (INDEPENDENT_AMBULATORY_CARE_PROVIDER_SITE_OTHER): Payer: Medicare Other | Admitting: Gastroenterology

## 2020-03-02 ENCOUNTER — Telehealth: Payer: Self-pay | Admitting: *Deleted

## 2020-03-02 ENCOUNTER — Other Ambulatory Visit: Payer: Self-pay

## 2020-03-02 ENCOUNTER — Encounter: Payer: Self-pay | Admitting: Gastroenterology

## 2020-03-02 DIAGNOSIS — R6881 Early satiety: Secondary | ICD-10-CM

## 2020-03-02 DIAGNOSIS — K219 Gastro-esophageal reflux disease without esophagitis: Secondary | ICD-10-CM | POA: Diagnosis not present

## 2020-03-02 NOTE — Telephone Encounter (Signed)
GES scheduled for 5/25 at 8:00am, arrival 7:45am, npo midnight, no stomach medications after midnight.  Called pt and she is aware of appt information. She voiced understanding and needed nothing further

## 2020-03-02 NOTE — Progress Notes (Signed)
Primary Care Physician:  Caren Macadam, MD  Primary GI: previously Dr. Oneida Alar   Patient Location: Home   Provider Location: [facility] office   Reason for Visit: Follow-up   Persons present on the virtual encounter, with roles: Patient and NP   Total time (minutes) spent on medical discussion: 15 minutes   Due to COVID-19, visit was conducted using virtual method.  Visit was requested by patient.  Virtual Visit via Telephone Note Due to COVID-19, visit is conducted virtually and was requested by patient.   I connected with Barbara House on 03/02/20 at  3:30 PM EDT by telephone and verified that I am speaking with the correct person using two identifiers.   I discussed the limitations, risks, security and privacy concerns of performing an evaluation and management service by telephone and the availability of in person appointments. I also discussed with the patient that there may be a patient responsible charge related to this service. The patient expressed understanding and agreed to proceed.  Chief Complaint  Patient presents with  . Gastroesophageal Reflux    off and on  . Dysphagia    sometimes feels like food gets stuck     History of Present Illness: Barbara House is a 64 year old female presenting via telephone for routine follow-up. History of chronic GERD, dysphagia s/p dilation in 2020. Followed by Hematology for thrombocytopenia. BPE in Dec 2020 normal.   Reflux "gets to me" off and on. Dexilant daily. Sometimes worried she will vomit. Feels full at times. Belching a whole lot. When breathing hard, feels like she might blow up. Belching and feels like food wants to come up in her neck. Eats a little snack before going to sleep. Says A1c was in the 9 range.   Eating 2-3 cookies, small piece of cake off and on. Mother's day had a pie that had cool whip with strawberries on it.    Past Medical History:  Diagnosis Date  . Allergic rhinitis   . Anxiety  disorder   . Arthritis   . GERD (gastroesophageal reflux disease)   . Headache   . History of migraine   . Hypertension   . Thrombocytopenia ([place])   . Type 2 diabetes mellitus ([place])    Not sure when she was diagnosed, greater than five years.      Past Surgical History:  Procedure Laterality Date  . ABDOMINAL HYSTERECTOMY     has ovaries.   [name] CARDIAC CATHETERIZATION N/A 08/03/2016   Procedure: Left Heart Cath and Coronary Angiography;  Surgeon: Peter M Martinique, MD;  Location: [facility];  Service: Cardiovascular;  Laterality: N/A;  . COLONOSCOPY WITH PROPOFOL N/A 11/05/2018   Dr. Oneida Alar: int/ext hemorrhoids, 61mm cecal tubular adenoma removed, next TCS in 5-10 years  . ESOPHAGOGASTRODUODENOSCOPY (EGD) WITH PROPOFOL N/A 11/05/2018   Dr. Oneida Alar: esophagus was dilated, mild gastritis with benign bx  . Miinor skin surgery    . POLYPECTOMY  11/05/2018   Procedure: POLYPECTOMY;  Surgeon: Danie Binder, MD;  [facility];  Service: Endoscopy;;  cecal polyp  . SAVORY DILATION N/A 11/05/2018   Procedure: SAVORY DILATION;  Surgeon: Danie Binder, MD;  Location: [facility];  Service: Endoscopy;  Laterality: N/A;     Current Meds  Medication Sig  . atorvastatin (LIPITOR) 20 MG tablet Take 10 mg by mouth daily.  . cetirizine (ZYRTEC) 10 MG tablet Take 10 mg by mouth daily.  . Cyanocobalamin (VITAMIN B-12  PO) Take 1 tablet by mouth daily.  . cycloSPORINE (RESTASIS) 0.05 % ophthalmic emulsion Place 1 drop into both eyes 2 (two) times daily as needed (for eye dryness.).  [name] dexlansoprazole (DEXILANT) 60 MG capsule 1 PO EVERY MORNING WITH BREAKFAST.  [name] glipiZIDE (GLUCOTROL) 5 MG tablet Take 5 mg by mouth daily before breakfast.  . losartan (COZAAR) 100 MG tablet Take 100 mg by mouth daily.  . montelukast (SINGULAIR) 10 MG tablet TAKE 1 TABLET (10 MG TOTAL) BY MOUTH AT BEDTIME AS NEEDED (FOR ALLERGIES.). (Patient taking differently: Take 10 mg by mouth daily. )  . PARoxetine  (PAXIL) 40 MG tablet TAKE ONE TABLET BY MOUTH EVERY MORNING. (Patient taking differently: Take 40 mg by mouth daily. )  . sitaGLIPtin-metformin (JANUMET) 50-1000 MG tablet Take 1 tablet by mouth 2 (two) times daily with a meal.  . verapamil (CALAN-SR) 240 MG CR tablet TAKE ONE TABLET BY MOUTH EVERY EVENING. (Patient taking differently: Take 240 mg by mouth daily after breakfast. )     Family History  Problem Relation Age of Onset  . Hypertension Mother   . Diabetes Mother   . Hypertension Son   . Diabetes Son   . Cancer Father   . Cirrhosis Brother   . Alcohol abuse Brother   . Colon cancer Neg Hx     Social History   Socioeconomic History  . Marital status: Single    Spouse name: Not on file  . Number of children: 1  . Years of education: Not on file  . Highest education level: Not on file  Occupational History  . Not on file  Tobacco Use  . Smoking status: Never Smoker  . Smokeless tobacco: Never Used  Substance and Sexual Activity  . Alcohol use: No    Alcohol/week: 0.0 standard drinks  . Drug use: No  . Sexual activity: Not Currently    Birth control/protection: Surgical  Other Topics Concern  . Not on file  Social History Narrative   Reports is on disability. Has one son. Lives in [place]. Enjoys time with family. Enjoys Firefighter. Attends church.    Social Determinants of Health   Financial Resource Strain:   . Difficulty of Paying Living Expenses:   Food Insecurity:   . Worried About Charity fundraiser in the Last Year:   . Arboriculturist in the Last Year:   Transportation Needs:   . Film/video editor (Medical):   [name] Lack of Transportation (Non-Medical):   Physical Activity:   . Days of Exercise per Week:   . Minutes of Exercise per Session:   Stress:   . Feeling of Stress :   Social Connections:   . Frequency of Communication with Friends and Family:   . Frequency of Social Gatherings with Friends and Family:   . Attends Religious Services:     . Active Member of Clubs or Organizations:   . Attends Archivist Meetings:   [name] Marital Status:        Review of Systems: Gen: Denies fever, chills, anorexia. Denies fatigue, weakness, weight loss.  CV: Denies chest pain, palpitations, syncope, peripheral edema, and claudication. Resp: Denies dyspnea at rest, cough, wheezing, coughing up blood, and pleurisy. GI: see HPI Derm: Denies rash, itching, dry skin Psych: Denies depression, anxiety, memory loss, confusion. No homicidal or suicidal ideation.  Heme: Denies bruising, bleeding, and enlarged lymph nodes.  Observations/Objective: No distress. Unable to perform physical exam due to telephone encounter. No  video available.   Assessment and Plan: 63 year old female with history of chronic GERD, now with early satiety, belching, and query delayed gastric emptying in setting of diabetes and poor dietary choices. Reportedly, her last A1c was in the 9 range. I discussed the nature of gastroparesis and choosing smaller, more frequent meals, healthy intake, avoidance of sodas and sugary drinks, avoidance of sweets. EGD on file from last year.   Will pursue GES in near future. I included a gastroparesis handout. Will see her physically back in about 4 months in the office.Continue Dexilant daily. Further recommendations to follow.   Follow Up Instructions:    I discussed the assessment and treatment plan with the patient. The patient was provided an opportunity to ask questions and all were answered. The patient agreed with the plan and demonstrated an understanding of the instructions.   The patient was advised to call back or seek an in-person evaluation if the symptoms worsen or if the condition fails to improve as anticipated.  I provided 15 minutes of non-face-to-face time during this encounter.  Annitta Needs, PhD, ANP-BC [facility]

## 2020-03-02 NOTE — Patient Instructions (Addendum)
We are arranging a gastric emptying study in the near future to see how well your stomach empties. I included a handout for this. It doesn't mean that you have "gastroparesis", but the information is helpful for what you need right now.   Eat smaller, more frequent meals throughout the day. Don't eat within 2-3 hours of laying down. Avoid the cakes, cookies, and sweets.  Continue Dexilant daily.  We will see you in person in 4 months!   Annitta Needs, PhD, ANP-BC [facility]   Gastroparesis  Gastroparesis is a condition in which food takes longer than normal to empty from the stomach. The condition is usually long-lasting (chronic). It may also be called delayed gastric emptying. There is no cure, but there are treatments and things that you can do at home to help relieve symptoms. Treating the underlying condition that causes gastroparesis can also help relieve symptoms. What are the causes? In many cases, the cause of this condition is not known. Possible causes include:  A hormone (endocrine) disorder, such as hypothyroidism or diabetes.  A nervous system disease, such as Parkinson's disease or multiple sclerosis.  Cancer, infection, or surgery that affects the stomach or vagus nerve. The vagus nerve runs from your chest, through your neck, to the lower part of your brain.  A connective tissue disorder, such as scleroderma.  Certain medicines. What increases the risk? You are more likely to develop this condition if you:  Have certain disorders or diseases, including: ? An endocrine disorder. ? An eating disorder. ? Amyloidosis. ? Scleroderma. ? Parkinson's disease. ? Multiple sclerosis. ? Cancer or infection of the stomach or the vagus nerve.  Have had surgery on the stomach or vagus nerve.  Take certain medicines.  Are female. What are the signs or symptoms? Symptoms of this condition include:  Feeling full after eating very  little.  Nausea.  Vomiting.  Heartburn.  Abdominal bloating.  Inconsistent blood sugar (glucose) levels on blood tests.  Lack of appetite.  Weight loss.  Acid from the stomach coming up into the esophagus (gastroesophageal reflux).  Sudden tightening (spasm) of the stomach, which can be painful. Symptoms may come and go. Some people may not notice any symptoms. How is this diagnosed? This condition is diagnosed with tests, such as:  Tests that check how long it takes food to move through the stomach and intestines. These tests include: ? Upper gastrointestinal (GI) series. For this test, you drink a liquid that shows up well on X-rays, and then X-rays will be taken of your intestines. ? Gastric emptying scintigraphy. For this test, you eat food that contains a small amount of radioactive material, and then scans are taken. ? Wireless capsule GI monitoring system. For this test, you swallow a pill (capsule) that records information about how foods and fluid move through your stomach.  Gastric manometry. For this test, a tube is passed down your throat and into your stomach to measure electrical and muscular activity.  Endoscopy. For this test, a long, thin tube is passed down your throat and into your stomach to check for problems in your stomach lining.  Ultrasound. This test uses sound waves to create images of inside the body. This can help rule out gallbladder disease or pancreatitis as a cause of your symptoms. How is this treated? There is no cure for gastroparesis. Treatment may include:  Treating the underlying cause.  Managing your symptoms by making changes to your diet and exercise habits.  Taking medicines  to control nausea and vomiting and to stimulate stomach muscles.  Getting food through a feeding tube in the hospital. This may be done in severe cases.  Having surgery to insert a device into your body that helps improve stomach emptying and control nausea  and vomiting (gastric neurostimulator). Follow these instructions at home:  Take over-the-counter and prescription medicines only as told by your health care provider.  Follow instructions from your health care provider about eating or drinking restrictions. Your health care provider may recommend that you: ? Eat smaller meals more often. ? Eat low-fat foods. ? Eat low-fiber forms of high-fiber foods. For example, eat cooked vegetables instead of raw vegetables. ? Have only liquid foods instead of solid foods. Liquid foods are easier to digest.  Drink enough fluid to keep your urine pale yellow.  Exercise as often as told by your health care provider.  Keep all follow-up visits as told by your health care provider. This is important. Contact a health care provider if you:  Notice that your symptoms do not improve with treatment.  Have new symptoms. Get help right away if you:  Have severe abdominal pain that does not improve with treatment.  Have nausea that is severe or does not go away.  Cannot drink fluids without vomiting. Summary  Gastroparesis is a chronic condition in which food takes longer than normal to empty from the stomach.  Symptoms include nausea, vomiting, heartburn, abdominal bloating, and loss of appetite.  Eating smaller portions, and low-fat, low-fiber foods may help you manage your symptoms.  Get help right away if you have severe abdominal pain. This information is not intended to replace advice given to you by your health care provider. Make sure you discuss any questions you have with your health care provider. Document Revised: 12/31/2017 Document Reviewed: 08/07/2017 Elsevier Patient Education  2020 [employer].

## 2020-03-05 ENCOUNTER — Telehealth: Payer: Self-pay | Admitting: *Deleted

## 2020-03-05 NOTE — Telephone Encounter (Signed)
Tried to call [place], received recording "automated response system not available at this time" and call was disconnected.

## 2020-03-05 NOTE — Telephone Encounter (Signed)
Pre-service Hulen Shouts) [phone] Ext: [phone] called.  She said GES requires authorization.  She said pt has Blue Advantage and MCD.

## 2020-03-05 NOTE — Telephone Encounter (Signed)
Attempted to submit PA via AIM website. Per AIM website for [employer], member ID: [id number]: The selected exam does not require an Order for this member and/or health plan.

## 2020-03-08 ENCOUNTER — Telehealth: Payer: Self-pay | Admitting: *Deleted

## 2020-03-08 NOTE — Telephone Encounter (Signed)
Received a call from nuc med stating patient GES is being cancelled for tomorrow. States it needs PA and we are saying it doesn't but they are going by what pre service center is saying that is does need PA the [facility].  Called patient and she states the [place] she only uses for her medications. She is aware GES is cancelled for now and will call her with new date/time

## 2020-03-08 NOTE — Telephone Encounter (Signed)
Per chart, Medicare Part A and B is primary.

## 2020-03-08 NOTE — Telephone Encounter (Signed)
Tried to call [place] Medicaid, received message "automated response system not available at this time" and call was disconnected.

## 2020-03-08 NOTE — Telephone Encounter (Signed)
Called [facility], spoke to [place], no PA needed for GES if outpatient or in network. She said it shows provider is in network. Ref# [id number].

## 2020-03-08 NOTE — Telephone Encounter (Signed)
Called nuc med and spoke to [place]. Informed her no PA is needed for GES. She put pt back on schedule for tomorrow as previously scheduled. Called and informed pt, advised her NPO after midnight and no stomach meds.

## 2020-03-09 ENCOUNTER — Ambulatory Visit (HOSPITAL_COMMUNITY)
Admission: RE | Admit: 2020-03-09 | Discharge: 2020-03-09 | Disposition: A | Discharge status: Home / Self Care | Payer: Medicare Other | Source: Ambulatory Visit | Attending: Gastroenterology | Admitting: Gastroenterology

## 2020-03-09 ENCOUNTER — Encounter (HOSPITAL_COMMUNITY): Payer: Medicare Other

## 2020-03-09 ENCOUNTER — Other Ambulatory Visit: Payer: Self-pay

## 2020-03-09 ENCOUNTER — Encounter (HOSPITAL_COMMUNITY): Payer: Self-pay

## 2020-03-09 DIAGNOSIS — K219 Gastro-esophageal reflux disease without esophagitis: Secondary | ICD-10-CM | POA: Diagnosis not present

## 2020-03-09 DIAGNOSIS — R6881 Early satiety: Secondary | ICD-10-CM | POA: Diagnosis present

## 2020-03-09 MED ORDER — TECHNETIUM TC 99M SULFUR COLLOID
2.0000 | Freq: Once | INTRAVENOUS | Status: AC | PRN
  Administered 2020-03-09: 2 via ORAL

## 2020-03-10 ENCOUNTER — Encounter: Payer: Self-pay | Admitting: *Deleted

## 2020-03-10 ENCOUNTER — Telehealth: Payer: Self-pay | Admitting: *Deleted

## 2020-03-10 NOTE — Telephone Encounter (Signed)
Barbara House, you are scheduled for a virtual visit with your provider today.  Just as we do with appointments in the office, we must obtain your consent to participate.  Your consent will be active for this visit and any virtual visit you may have with one of our providers in the next 365 days.  If you have a MyChart account, I can also send a copy of this consent to you electronically.  All virtual visits are billed to your insurance company just like a traditional visit in the office.  As this is a virtual visit, video technology does not allow for your provider to perform a traditional examination.  This may limit your provider's ability to fully assess your condition.  If your provider identifies any concerns that need to be evaluated in person or the need to arrange testing such as labs, EKG, etc, we will make arrangements to do so.  Although advances in technology are sophisticated, we cannot ensure that it will always work on either your end or our end.  If the connection with a video visit is poor, we may have to switch to a telephone visit.  With either a video or telephone visit, we are not always able to ensure that we have a secure connection.   I need to obtain your verbal consent now.   Are you willing to proceed with your visit today?

## 2020-03-10 NOTE — Telephone Encounter (Signed)
Pt consented to a telephone visit on 03/02/20.

## 2020-03-16 ENCOUNTER — Telehealth: Payer: Self-pay | Admitting: Gastroenterology

## 2020-03-16 NOTE — Telephone Encounter (Signed)
PATIENT CALLED STATING SOMEONE FROM HERE CALLED HER, PLEASE CALL BACK

## 2020-03-16 NOTE — Telephone Encounter (Signed)
Notified pt of results, see lab results

## 2020-03-18 ENCOUNTER — Telehealth: Payer: Self-pay | Admitting: Gastroenterology

## 2020-03-18 NOTE — Telephone Encounter (Signed)
Notified her appt with ab is on 7/13, labs were ordered by a different provider and she stated she will have those done next week

## 2020-03-18 NOTE — Telephone Encounter (Signed)
Pt said she received a call that she has appt here tomorrow. I told her, no, she has labs due tomorrow. She said she knew nothing about it. Please call [phone]

## 2020-03-19 ENCOUNTER — Inpatient Hospital Stay (HOSPITAL_COMMUNITY): Payer: Medicare Other | Attending: Hematology

## 2020-03-19 DIAGNOSIS — F419 Anxiety disorder, unspecified: Secondary | ICD-10-CM | POA: Insufficient documentation

## 2020-03-19 DIAGNOSIS — Z7984 Long term (current) use of oral hypoglycemic drugs: Secondary | ICD-10-CM | POA: Insufficient documentation

## 2020-03-19 DIAGNOSIS — I10 Essential (primary) hypertension: Secondary | ICD-10-CM | POA: Insufficient documentation

## 2020-03-19 DIAGNOSIS — E119 Type 2 diabetes mellitus without complications: Secondary | ICD-10-CM | POA: Insufficient documentation

## 2020-03-19 DIAGNOSIS — Z833 Family history of diabetes mellitus: Secondary | ICD-10-CM | POA: Insufficient documentation

## 2020-03-19 DIAGNOSIS — D649 Anemia, unspecified: Secondary | ICD-10-CM | POA: Insufficient documentation

## 2020-03-19 DIAGNOSIS — Z8249 Family history of ischemic heart disease and other diseases of the circulatory system: Secondary | ICD-10-CM | POA: Insufficient documentation

## 2020-03-19 DIAGNOSIS — Z79899 Other long term (current) drug therapy: Secondary | ICD-10-CM | POA: Insufficient documentation

## 2020-03-19 DIAGNOSIS — D696 Thrombocytopenia, unspecified: Secondary | ICD-10-CM | POA: Insufficient documentation

## 2020-03-19 DIAGNOSIS — K219 Gastro-esophageal reflux disease without esophagitis: Secondary | ICD-10-CM | POA: Insufficient documentation

## 2020-03-19 DIAGNOSIS — Z90710 Acquired absence of both cervix and uterus: Secondary | ICD-10-CM | POA: Insufficient documentation

## 2020-03-25 ENCOUNTER — Other Ambulatory Visit (HOSPITAL_COMMUNITY): Payer: Medicare Other

## 2020-03-26 ENCOUNTER — Ambulatory Visit (HOSPITAL_COMMUNITY): Payer: Medicare Other | Admitting: Nurse Practitioner

## 2020-03-29 ENCOUNTER — Inpatient Hospital Stay (HOSPITAL_COMMUNITY): Payer: Medicare Other | Attending: Hematology

## 2020-03-29 ENCOUNTER — Other Ambulatory Visit: Payer: Self-pay

## 2020-03-29 DIAGNOSIS — I10 Essential (primary) hypertension: Secondary | ICD-10-CM | POA: Insufficient documentation

## 2020-03-29 DIAGNOSIS — D696 Thrombocytopenia, unspecified: Secondary | ICD-10-CM | POA: Insufficient documentation

## 2020-03-29 DIAGNOSIS — E119 Type 2 diabetes mellitus without complications: Secondary | ICD-10-CM | POA: Diagnosis not present

## 2020-03-29 DIAGNOSIS — Z79899 Other long term (current) drug therapy: Secondary | ICD-10-CM | POA: Insufficient documentation

## 2020-03-29 LAB — COMPREHENSIVE METABOLIC PANEL
ALT: 21 U/L (ref 0–44)
AST: 23 U/L (ref 15–41)
Albumin: 4 g/dL (ref 3.5–5.0)
Alkaline Phosphatase: 81 U/L (ref 38–126)
Anion gap: 11 (ref 5–15)
BUN: 20 mg/dL (ref 8–23)
CO2: 25 mmol/L (ref 22–32)
Calcium: 9.7 mg/dL (ref 8.9–10.3)
Chloride: 103 mmol/L (ref 98–111)
Creatinine, Ser: 1.16 mg/dL — ABNORMAL HIGH (ref 0.44–1.00)
GFR calc Af Amer: 59 mL/min — ABNORMAL LOW (ref >60)
GFR calc non Af Amer: 51 mL/min — ABNORMAL LOW (ref >60)
Glucose, Bld: 144 mg/dL — ABNORMAL HIGH (ref 70–99)
Potassium: 5 mmol/L (ref 3.5–5.1)
Sodium: 139 mmol/L (ref 135–145)
Total Bilirubin: 0.5 mg/dL (ref 0.3–1.2)
Total Protein: 7.4 g/dL (ref 6.5–8.1)

## 2020-03-29 LAB — CBC WITH DIFFERENTIAL/PLATELET
Abs Immature Granulocytes: 0.02 10*3/uL (ref 0.00–0.07)
Basophils Absolute: 0 10*3/uL (ref 0.0–0.1)
Basophils Relative: 1 %
Eosinophils Absolute: 0.3 10*3/uL (ref 0.0–0.5)
Eosinophils Relative: 6 %
HCT: 37.1 % (ref 36.0–46.0)
Hemoglobin: 11.4 g/dL — ABNORMAL LOW (ref 12.0–15.0)
Immature Granulocytes: 0 %
Lymphocytes Relative: 28 %
Lymphs Abs: 1.6 10*3/uL (ref 0.7–4.0)
MCH: 27.2 pg (ref 26.0–34.0)
MCHC: 30.7 g/dL (ref 30.0–36.0)
MCV: 88.5 fL (ref 80.0–100.0)
Monocytes Absolute: 0.5 10*3/uL (ref 0.1–1.0)
Monocytes Relative: 8 %
Neutro Abs: 3.3 10*3/uL (ref 1.7–7.7)
Neutrophils Relative %: 57 %
Platelets: 80 10*3/uL — ABNORMAL LOW (ref 150–400)
RBC: 4.19 MIL/uL (ref 3.87–5.11)
RDW: 13.2 % (ref 11.5–15.5)
WBC: 5.8 10*3/uL (ref 4.0–10.5)
nRBC: 0 % (ref 0.0–0.2)

## 2020-03-30 ENCOUNTER — Inpatient Hospital Stay (HOSPITAL_BASED_OUTPATIENT_CLINIC_OR_DEPARTMENT_OTHER): Payer: Medicare Other | Admitting: Nurse Practitioner

## 2020-03-30 ENCOUNTER — Other Ambulatory Visit: Payer: Self-pay

## 2020-03-30 DIAGNOSIS — D696 Thrombocytopenia, unspecified: Secondary | ICD-10-CM

## 2020-03-30 DIAGNOSIS — Z7984 Long term (current) use of oral hypoglycemic drugs: Secondary | ICD-10-CM | POA: Diagnosis not present

## 2020-03-30 DIAGNOSIS — Z79899 Other long term (current) drug therapy: Secondary | ICD-10-CM | POA: Diagnosis not present

## 2020-03-30 DIAGNOSIS — F419 Anxiety disorder, unspecified: Secondary | ICD-10-CM | POA: Diagnosis not present

## 2020-03-30 DIAGNOSIS — K219 Gastro-esophageal reflux disease without esophagitis: Secondary | ICD-10-CM | POA: Diagnosis not present

## 2020-03-30 DIAGNOSIS — Z8249 Family history of ischemic heart disease and other diseases of the circulatory system: Secondary | ICD-10-CM | POA: Diagnosis not present

## 2020-03-30 DIAGNOSIS — E119 Type 2 diabetes mellitus without complications: Secondary | ICD-10-CM | POA: Diagnosis not present

## 2020-03-30 DIAGNOSIS — Z90710 Acquired absence of both cervix and uterus: Secondary | ICD-10-CM | POA: Diagnosis not present

## 2020-03-30 DIAGNOSIS — I10 Essential (primary) hypertension: Secondary | ICD-10-CM | POA: Diagnosis not present

## 2020-03-30 DIAGNOSIS — Z833 Family history of diabetes mellitus: Secondary | ICD-10-CM | POA: Diagnosis not present

## 2020-03-30 DIAGNOSIS — D649 Anemia, unspecified: Secondary | ICD-10-CM | POA: Diagnosis not present

## 2020-03-30 LAB — ANTINUCLEAR ANTIBODIES, IFA: ANA Ab, IFA: NEGATIVE

## 2020-03-30 LAB — RHEUMATOID FACTOR: Rheumatoid fact SerPl-aCnc: <10 IU/mL (ref 0.0–13.9)

## 2020-03-30 NOTE — Patient Instructions (Signed)
[facility] Discharge Instructions  Follow up in 2 months with labs    Thank you for choosing [facility] at [facility] to provide your oncology and hematology care.  To afford each patient quality time with our provider, please arrive at least 15 minutes before your scheduled appointment time.   If you have a lab appointment with the Cancer Center please come in thru the Main Entrance and check in at the main information desk.  You need to re-schedule your appointment should you arrive 10 or more minutes late.  We strive to give you quality time with our providers, and arriving late affects you and other patients whose appointments are after yours.  Also, if you no show three or more times for appointments you may be dismissed from the clinic at the providers discretion.     Again, thank you for choosing [facility].  Our hope is that these requests will decrease the amount of time that you wait before being seen by our physicians.       _____________________________________________________________  Should you have questions after your visit to [facility], please contact our office at [phone] between the hours of 8:00 a.m. and 4:30 p.m.  Voicemails left after 4:00 p.m. will not be returned until the following business day.  For prescription refill requests, have your pharmacy contact our office and allow 72 hours.    Due to Covid, you will need to wear a mask upon entering the hospital. If you do not have a mask, a mask will be given to you at the Main Entrance upon arrival. For doctor visits, patients may have 1 support person with them. For treatment visits, patients can not have anyone with them due to social distancing guidelines and our immunocompromised population.      

## 2020-03-30 NOTE — Progress Notes (Signed)
Barbara House,  [id number]   CLINIC:  Medical Oncology/Hematology  PCP:  Barbara House, [facility] [phone]   REASON FOR VISIT: Follow-up for thrombocytopenia   CURRENT THERAPY: Observation   INTERVAL HISTORY:  [name] 64 y.o. female returns for routine follow-up for thrombocytopenia.  She reports she is doing well since his last visit.  She denies any petechiae.  She denies any unusual bleeding or bruising. Denies any nausea, vomiting, or diarrhea. Denies any new pains. Had not noticed any recent bleeding such as epistaxis, hematuria or hematochezia. Denies recent chest pain on exertion, shortness of breath on minimal exertion, pre-syncopal episodes, or palpitations. Denies any numbness or tingling in hands or feet. Denies any recent fevers, infections, or recent hospitalizations. Patient reports appetite at 100% and energy level at 100%.  She is eating well maintain her weight at this time.     REVIEW OF SYSTEMS:  Review of Systems  Cardiovascular: Positive for leg swelling.  All other systems reviewed and are negative.    PAST MEDICAL/SURGICAL HISTORY:  Past Medical History:  Diagnosis Date  . Allergic rhinitis   . Anxiety disorder   . Arthritis   . GERD (gastroesophageal reflux disease)   . Headache   . History of migraine   . Hypertension   . Thrombocytopenia (Barbara House)   . Type 2 diabetes mellitus (Barbara House)    Not sure when she was diagnosed, greater than five years.    Past Surgical History:  Procedure Laterality Date  . ABDOMINAL HYSTERECTOMY     has ovaries.   [name] CARDIAC CATHETERIZATION N/A 08/03/2016   Procedure: Left Heart Cath and Coronary Angiography;  Surgeon: Barbara M Martinique, MD;  Location: [facility];  Service: Cardiovascular;  Laterality: N/A;  . COLONOSCOPY WITH PROPOFOL N/A 11/05/2018   Dr. Oneida House: int/ext hemorrhoids, 70mm cecal tubular adenoma removed, next TCS in  5-10 years  . ESOPHAGOGASTRODUODENOSCOPY (EGD) WITH PROPOFOL N/A 11/05/2018   Dr. Oneida House: esophagus was dilated, mild gastritis with benign bx  . Miinor skin surgery    . POLYPECTOMY  11/05/2018   Procedure: POLYPECTOMY;  Surgeon: Barbara Binder, MD;  [facility];  Service: Endoscopy;;  cecal polyp  . SAVORY DILATION N/A 11/05/2018   Procedure: SAVORY DILATION;  Surgeon: Barbara Binder, MD;  [facility];  Service: Endoscopy;  Laterality: N/A;     SOCIAL HISTORY:  Social History   Socioeconomic History  . Marital status: Single    Spouse name: Not on file  . Number of children: 1  . Years of education: Not on file  . Highest education level: Not on file  Occupational History  . Not on file  Tobacco Use  . Smoking status: Never Smoker  . Smokeless tobacco: Never Used  Vaping Use  . Vaping Use: Never used  Substance and Sexual Activity  . Alcohol use: No    Alcohol/week: 0.0 standard drinks  . Drug use: No  . Sexual activity: Not Currently    Birth control/protection: Surgical  Other Topics Concern  . Not on file  Social History Narrative   Reports is on disability. Has one son. Lives in [place]. Enjoys time with family. Enjoys Firefighter. Attends church.    Social Determinants of Health   Financial Resource Strain:   . Difficulty of Paying Living Expenses:   Food Insecurity:   . Worried About Charity fundraiser in the  Last Year:   . [place] in the Last Year:   Transportation Needs:   . Film/video editor (Medical):   [name] Lack of Transportation (Non-Medical):   Physical Activity:   . Days of Exercise per Week:   . Minutes of Exercise per Session:   Stress:   . Feeling of Stress :   Social Connections:   . Frequency of Communication with Friends and Family:   . Frequency of Social Gatherings with Friends and Family:   . Attends Religious Services:   . Active Member of Clubs or Organizations:   . Attends Archivist  Meetings:   [name] Marital Status:   Intimate Partner Violence:   . Fear of Current or Ex-Partner:   . Emotionally Abused:   [name] Physically Abused:   . Sexually Abused:     FAMILY HISTORY:  Family History  Problem Relation Age of Onset  . Hypertension Mother   . Diabetes Mother   . Hypertension Son   . Diabetes Son   . Cancer Father   . Cirrhosis Brother   . Alcohol abuse Brother   . Colon cancer Neg Hx     CURRENT MEDICATIONS:  Outpatient Encounter Medications as of 03/30/2020  Medication Sig  . atorvastatin (LIPITOR) 20 MG tablet Take 10 mg by mouth daily.  . cetirizine (ZYRTEC) 10 MG tablet Take 10 mg by mouth daily.  . Cyanocobalamin (VITAMIN B-12 PO) Take 1 tablet by mouth daily.  [name] dexlansoprazole (DEXILANT) 60 MG capsule 1 PO EVERY MORNING WITH BREAKFAST.  [name] glipiZIDE (GLUCOTROL) 5 MG tablet Take 5 mg by mouth daily before breakfast.  . losartan (COZAAR) 100 MG tablet Take 100 mg by mouth daily.  . montelukast (SINGULAIR) 10 MG tablet TAKE 1 TABLET (10 MG TOTAL) BY MOUTH AT BEDTIME AS NEEDED (FOR ALLERGIES.). (Patient taking differently: Take 10 mg by mouth daily. )  . PARoxetine (PAXIL) 40 MG tablet TAKE ONE TABLET BY MOUTH EVERY MORNING. (Patient taking differently: Take 40 mg by mouth daily. )  . sitaGLIPtin-metformin (JANUMET) 50-1000 MG tablet Take 1 tablet by mouth 2 (two) times daily with a meal.  . verapamil (CALAN-SR) 240 MG CR tablet TAKE ONE TABLET BY MOUTH EVERY EVENING. (Patient taking differently: Take 240 mg by mouth daily after breakfast. )  . cycloSPORINE (RESTASIS) 0.05 % ophthalmic emulsion Place 1 drop into both eyes 2 (two) times daily as needed (for eye dryness.). (Patient not taking: Reported on 03/30/2020)   No facility-administered encounter medications on file as of 03/30/2020.    ALLERGIES:  Allergies  Allergen Reactions  . Prednisone Other (See Comments)    Hair loss     PHYSICAL EXAM:  ECOG Performance status: 1  Vitals:   03/30/20 1131  03/30/20 1132  BP: (!) 154/78 (!) 149/91  Pulse: 70   Resp: 18   Temp: 98.2 F (36.8 C)   SpO2: 99%    Filed Weights   03/30/20 1131  Weight: 206 lb 9.6 oz (93.7 kg)   Physical Exam Constitutional:      Appearance: Normal appearance. She is normal weight.  Cardiovascular:     Rate and Rhythm: Normal rate and regular rhythm.     Heart sounds: Normal heart sounds.  Pulmonary:     Effort: Pulmonary effort is normal.     Breath sounds: Normal breath sounds.  Abdominal:     General: Bowel sounds are normal.     Palpations: Abdomen is soft.  Musculoskeletal:  General: Normal range of motion.  Skin:    General: Skin is warm.  Neurological:     Mental Status: She is alert and oriented to person, place, and time. Mental status is at baseline.  Psychiatric:        Mood and Affect: Mood normal.        Behavior: Behavior normal.        Thought Content: Thought content normal.        Judgment: Judgment normal.      LABORATORY DATA:  I have reviewed the labs as listed.  CBC    Component Value Date/Time   WBC 5.8 03/29/2020 1049   RBC 4.19 03/29/2020 1049   HGB 11.4 (L) 03/29/2020 1049   HGB 11.7 06/25/2018 1548   HCT 37.1 03/29/2020 1049   HCT 34.7 06/25/2018 1548   PLT 80 (L) 03/29/2020 1049   PLT 165 06/25/2018 1548   MCV 88.5 03/29/2020 1049   MCV 85 06/25/2018 1548   MCH 27.2 03/29/2020 1049   MCHC 30.7 03/29/2020 1049   RDW 13.2 03/29/2020 1049   RDW 13.5 06/25/2018 1548   LYMPHSABS 1.6 03/29/2020 1049   LYMPHSABS 2.4 06/25/2018 1548   MONOABS 0.5 03/29/2020 1049   EOSABS 0.3 03/29/2020 1049   EOSABS 0.3 06/25/2018 1548   BASOSABS 0.0 03/29/2020 1049   BASOSABS 0.0 06/25/2018 1548   CMP Latest Ref Rng & Units 03/29/2020 08/01/2019 07/31/2019  Glucose 70 - 99 mg/dL 144(H) 242(H) 215(H)  BUN 8 - 23 mg/dL 20 37(H) 49(H)  Creatinine 0.44 - 1.00 mg/dL 1.16(H) 1.71(H) 2.32(H)  Sodium 135 - 145 mmol/L 139 137 138  Potassium 3.5 - 5.1 mmol/L 5.0 4.7 4.4    Chloride 98 - 111 mmol/L 103 106 105  CO2 22 - 32 mmol/L 25 23 22   Calcium 8.9 - 10.3 mg/dL 9.7 9.4 9.8  Total Protein 6.5 - 8.1 g/dL 7.4 - 7.2  Total Bilirubin 0.3 - 1.2 mg/dL 0.5 - 0.5  Alkaline Phos 38 - 126 U/L 81 - 90  AST 15 - 41 U/L 23 - 34  ALT 0 - 44 U/L 21 - 25    All questions were answered to patient's stated satisfaction. Encouraged patient to call with any new concerns or questions before his next visit to the cancer center and we can certain see him sooner, if needed.     ASSESSMENT & PLAN:  Thrombocytopenia ([place]) 1.  Mild to moderate thrombocytopenia: -Initial CBC on 08/11/2019 shows platelet count 108.  Hemoglobin 11.3 with normal MCV and normal white count. -It was documented on EMR that her platelet count was 97 on 07/31/2019.  And 93 on 10/30/2018. -She denies any fevers, night sweats, chills or unexplained weight loss. -No easy bruising or bleeding. -She reported she did have one blood transfusion that was many many years ago.  She is unsure of the reason. -She is on vitamin B12 injections and also taking vitamin B12 pills. -Initial work-up was negative.  SPEP was normal.  Hepatitis panel was normal.  Ferritin was low at 44. -Labs done on 03/29/2020 showed WBC 5.8, hemoglobin 11.4 and platelets 80.  RF and ANA are pending -No intervention needed at this time. -She will follow-up in 2 months with repeat labs.  2.  Normocytic anemia: -She has mild anemia between 11 and 12. -She denies any bright red bleeding per rectum or melena. -She is taking vitamin B12 injections monthly and oral vitamin B12. -Labs done on 03/29/2020 showed hemoglobin 11.4. -We  will recheck her iron levels at her next visit.     Orders placed this encounter:  Orders Placed This Encounter  Procedures  . Lactate dehydrogenase  . CBC with Differential/Platelet  . Comprehensive metabolic panel  . Ferritin  . Iron and TIBC  . Vitamin B12  . VITAMIN D 25 Hydroxy (Vit-D Deficiency,  Fractures)  . Folate      Francene Finders, FNP-C [place] [phone]

## 2020-03-30 NOTE — Assessment & Plan Note (Signed)
1.  Mild to moderate thrombocytopenia: -Initial CBC on 08/11/2019 shows platelet count 108.  Hemoglobin 11.3 with normal MCV and normal white count. -It was documented on EMR that her platelet count was 97 on 07/31/2019.  And 93 on 10/30/2018. -She denies any fevers, night sweats, chills or unexplained weight loss. -No easy bruising or bleeding. -She reported she did have one blood transfusion that was many many years ago.  She is unsure of the reason. -She is on vitamin B12 injections and also taking vitamin B12 pills. -Initial work-up was negative.  SPEP was normal.  Hepatitis panel was normal.  Ferritin was low at 44. -Labs done on 03/29/2020 showed WBC 5.8, hemoglobin 11.4 and platelets 80.  RF and ANA are pending -No intervention needed at this time. -She will follow-up in 2 months with repeat labs.  2.  Normocytic anemia: -She has mild anemia between 11 and 12. -She denies any bright red bleeding per rectum or melena. -She is taking vitamin B12 injections monthly and oral vitamin B12. -Labs done on 03/29/2020 showed hemoglobin 11.4. -We will recheck her iron levels at her next visit.

## 2020-04-26 NOTE — Progress Notes (Signed)
Referring Provider: Caren Macadam, MD Primary Care Physician:  Caren Macadam, MD  Primary GI: previously Dr. Oneida Alar   Chief Complaint  Patient presents with  . Abdominal Pain    middle    HPI:   Barbara House is a 64 y.o. female presenting today with a history of chronic GERD, dysphagia s/p dilation in 2020. Followed by Hematology for thrombocytopenia. BPE in Dec 2020 normal. GES completed May 2021 and noted to have significantly delayed gastric emptying in setting of diabetes.   Sometimes abdominal pain prior to BMs then relieved. Not associated with eating. No rectal bleeding.  Good appetite. Rare nausea. No vomiting. Only issues with N/V if sinus infection. No weight loss.   Prior to end of visit, brought up that left side stays sore, pointing to LUQ and lateral side. Doesn't like something touching it. Stays sore all the time. No association with eating. US abdomen complete on file from 2018. No CT that I am aware.   Past Medical History:  Diagnosis Date  . Allergic rhinitis   . Anxiety disorder   . Arthritis   . GERD (gastroesophageal reflux disease)   . Headache   . History of migraine   . Hypertension   . Thrombocytopenia ([place])   . Type 2 diabetes mellitus ([place])    Not sure when she was diagnosed, greater than five years.     Past Surgical History:  Procedure Laterality Date  . ABDOMINAL HYSTERECTOMY     has ovaries.   [name] CARDIAC CATHETERIZATION N/A 08/03/2016   Procedure: Left Heart Cath and Coronary Angiography;  Surgeon: Peter M Martinique, MD;  Location: [facility];  Service: Cardiovascular;  Laterality: N/A;  . COLONOSCOPY WITH PROPOFOL N/A 11/05/2018   Dr. Oneida Alar: int/ext hemorrhoids, 110mm cecal tubular adenoma removed, next TCS in 5-10 years  . ESOPHAGOGASTRODUODENOSCOPY (EGD) WITH PROPOFOL N/A 11/05/2018   Dr. Oneida Alar: esophagus was dilated, mild gastritis with benign bx  . Miinor skin surgery    . POLYPECTOMY  11/05/2018   Procedure:  POLYPECTOMY;  Surgeon: Danie Binder, MD;  [facility];  Service: Endoscopy;;  cecal polyp  . SAVORY DILATION N/A 11/05/2018   Procedure: SAVORY DILATION;  Surgeon: Danie Binder, MD;  [facility];  Service: Endoscopy;  Laterality: N/A;    Current Outpatient Medications  Medication Sig Dispense Refill  . atorvastatin (LIPITOR) 20 MG tablet Take 10 mg by mouth daily.    . cetirizine (ZYRTEC) 10 MG tablet Take 10 mg by mouth daily.    . Cyanocobalamin (VITAMIN B-12 PO) Take 1 tablet by mouth daily.    . cycloSPORINE (RESTASIS) 0.05 % ophthalmic emulsion Place 1 drop into both eyes 2 (two) times daily as needed (for eye dryness.).     [name] dexlansoprazole (DEXILANT) 60 MG capsule 1 PO EVERY MORNING WITH BREAKFAST. 90 capsule 3  . glipiZIDE (GLUCOTROL) 5 MG tablet Take 5 mg by mouth daily before breakfast.    . losartan (COZAAR) 100 MG tablet Take 100 mg by mouth daily.    . montelukast (SINGULAIR) 10 MG tablet TAKE 1 TABLET (10 MG TOTAL) BY MOUTH AT BEDTIME AS NEEDED (FOR ALLERGIES.). (Patient taking differently: Take 10 mg by mouth daily. ) 90 tablet 1  . PARoxetine (PAXIL) 40 MG tablet TAKE ONE TABLET BY MOUTH EVERY MORNING. (Patient taking differently: Take 40 mg by mouth daily. ) 90 tablet 0  . sitaGLIPtin-metformin (JANUMET) 50-1000 MG tablet Take 1  tablet by mouth 2 (two) times daily with a meal.    . verapamil (CALAN-SR) 240 MG CR tablet TAKE ONE TABLET BY MOUTH EVERY EVENING. (Patient taking differently: Take 240 mg by mouth daily after breakfast. ) 90 tablet 0   No current facility-administered medications for this visit.    Allergies as of 04/27/2020 - Review Complete 04/27/2020  Allergen Reaction Noted  . Prednisone Other (See Comments) 09/15/2016    Family History  Problem Relation Age of Onset  . Hypertension Mother   . Diabetes Mother   . Hypertension Son   . Diabetes Son   . Cancer Father   . Cirrhosis Brother   . Alcohol abuse Brother   . Colon  cancer Neg Hx     Social History   Socioeconomic History  . Marital status: Single    Spouse name: Not on file  . Number of children: 1  . Years of education: Not on file  . Highest education level: Not on file  Occupational History  . Not on file  Tobacco Use  . Smoking status: Never Smoker  . Smokeless tobacco: Never Used  Vaping Use  . Vaping Use: Never used  Substance and Sexual Activity  . Alcohol use: No    Alcohol/week: 0.0 standard drinks  . Drug use: No  . Sexual activity: Not Currently    Birth control/protection: Surgical  Other Topics Concern  . Not on file  Social History Narrative   Reports is on disability. Has one son. Lives in [place]. Enjoys time with family. Enjoys Firefighter. Attends church.    Social Determinants of Health   Financial Resource Strain:   . Difficulty of Paying Living Expenses:   Food Insecurity:   . Worried About Charity fundraiser in the Last Year:   . Arboriculturist in the Last Year:   Transportation Needs:   . Film/video editor (Medical):   [name] Lack of Transportation (Non-Medical):   Physical Activity:   . Days of Exercise per Week:   . Minutes of Exercise per Session:   Stress:   . Feeling of Stress :   Social Connections:   . Frequency of Communication with Friends and Family:   . Frequency of Social Gatherings with Friends and Family:   . Attends Religious Services:   . Active Member of Clubs or Organizations:   . Attends Archivist Meetings:   [name] Marital Status:     Review of Systems: Gen: Denies fever, chills, anorexia. Denies fatigue, weakness, weight loss.  CV: Denies chest pain, palpitations, syncope, peripheral edema, and claudication. Resp: Denies dyspnea at rest, cough, wheezing, coughing up blood, and pleurisy. GI: see HPI Derm: Denies rash, itching, dry skin Psych: Denies depression, anxiety, memory loss, confusion. No homicidal or suicidal ideation.  Heme: Denies bruising, bleeding, and  enlarged lymph nodes.  Physical Exam: BP (!) 147/72   Pulse 82   Temp (!) 97.5 F (36.4 C) (Oral)   Ht 5\' 1"  (1.549 m)   Wt 208 lb (94.3 kg)   BMI 39.30 kg/m  General:   Alert and oriented. No distress noted. Pleasant and cooperative.  Head:  Normocephalic and atraumatic. Eyes:  Conjuctiva clear without scleral icterus. Mouth:  Mask in place Abdomen:  +BS, soft, obese,  TTP LUQ and non-distended. No rebound or guarding. No HSM or masses noted. Msk:  Symmetrical without gross deformities. Normal posture. Extremities:  Without edema. Neurologic:  Alert and  oriented x4 Psych:  Alert and cooperative. Normal mood and affect.  ASSESSMENT: AMYIAH GABA is a 64 y.o. female presenting today with history of chronic GERD, chronic LUQ pain, thrombocytopenia followed by Hematology, and recently found to have gastroparesis in setting of diabetes.  GERD overall is controlled well with Dexilant daily. No alarm signs/symptoms. She did note chronic left-sided abdominal pain for years, always sore, worsened with touch. US abdomen complete on file from 2018 without splenomegaly, liver normal. No CT. Likely musculoskeletal etiology, +/- gastritis. Will pursue imaging at her request. No alarm signs/symptoms.    PLAN:   Continue Dexilant daily  CT abdomen with contrast. Hold Janumet day of and 48 hours after  6-8 month return  Colonoscopy 2025-2030

## 2020-04-27 ENCOUNTER — Other Ambulatory Visit: Payer: Self-pay

## 2020-04-27 ENCOUNTER — Encounter: Payer: Self-pay | Admitting: *Deleted

## 2020-04-27 ENCOUNTER — Telehealth: Payer: Self-pay | Admitting: *Deleted

## 2020-04-27 ENCOUNTER — Ambulatory Visit (INDEPENDENT_AMBULATORY_CARE_PROVIDER_SITE_OTHER): Payer: Medicare Other | Admitting: Gastroenterology

## 2020-04-27 ENCOUNTER — Encounter: Payer: Self-pay | Admitting: Gastroenterology

## 2020-04-27 VITALS — BP 147/72 | HR 82 | Temp 97.5°F | Ht 61.0 in | Wt 208.0 lb

## 2020-04-27 DIAGNOSIS — K219 Gastro-esophageal reflux disease without esophagitis: Secondary | ICD-10-CM | POA: Diagnosis not present

## 2020-04-27 DIAGNOSIS — R1012 Left upper quadrant pain: Secondary | ICD-10-CM

## 2020-04-27 MED ORDER — DEXILANT 60 MG PO CPDR: DELAYED_RELEASE_CAPSULE | ORAL | 3 refills | Status: DC

## 2020-04-27 NOTE — Telephone Encounter (Signed)
Per [facility]:  "Based on the information you have provided, the member's plan will not allow the use of an Out of Network Provider. All exams have been closed as Referred to Health Plan".

## 2020-04-27 NOTE — Patient Instructions (Signed)
Continue Dexilant daily.  We are ordering a CT scan due to persistent abdominal pain.   DO NOT TAKE JANUMET the day of the CT scan or for 48 hours after the CT, then you can start taking again.  We will see you in 6-8 months!   I enjoyed seeing you again today! As you know, I value our relationship and want to provide genuine, compassionate, and quality care. I welcome your feedback. If you receive a survey regarding your visit,  I greatly appreciate you taking time to fill this out. See you next time!  Annitta Needs, PhD, ANP-BC [facility]

## 2020-05-12 ENCOUNTER — Telehealth: Payer: Self-pay | Admitting: Internal Medicine

## 2020-05-12 NOTE — Telephone Encounter (Signed)
Pt called wanting to cancel her CT for 8/3 and said she wanted to hold off for now. I gave her the number to call when she's ready to reschedule.

## 2020-05-12 NOTE — Telephone Encounter (Signed)
Noted. FYI to AB

## 2020-05-18 ENCOUNTER — Ambulatory Visit (HOSPITAL_COMMUNITY): Payer: Medicare Other

## 2020-06-01 ENCOUNTER — Other Ambulatory Visit: Payer: Self-pay

## 2020-06-01 ENCOUNTER — Inpatient Hospital Stay (HOSPITAL_COMMUNITY): Payer: Medicare Other | Attending: Hematology

## 2020-06-01 DIAGNOSIS — I10 Essential (primary) hypertension: Secondary | ICD-10-CM | POA: Insufficient documentation

## 2020-06-01 DIAGNOSIS — Z79899 Other long term (current) drug therapy: Secondary | ICD-10-CM | POA: Diagnosis not present

## 2020-06-01 DIAGNOSIS — E119 Type 2 diabetes mellitus without complications: Secondary | ICD-10-CM | POA: Insufficient documentation

## 2020-06-01 DIAGNOSIS — Z8249 Family history of ischemic heart disease and other diseases of the circulatory system: Secondary | ICD-10-CM | POA: Diagnosis not present

## 2020-06-01 DIAGNOSIS — Z809 Family history of malignant neoplasm, unspecified: Secondary | ICD-10-CM | POA: Diagnosis not present

## 2020-06-01 DIAGNOSIS — Z90710 Acquired absence of both cervix and uterus: Secondary | ICD-10-CM | POA: Insufficient documentation

## 2020-06-01 DIAGNOSIS — D696 Thrombocytopenia, unspecified: Secondary | ICD-10-CM | POA: Diagnosis present

## 2020-06-01 DIAGNOSIS — Z833 Family history of diabetes mellitus: Secondary | ICD-10-CM | POA: Insufficient documentation

## 2020-06-01 DIAGNOSIS — Z7984 Long term (current) use of oral hypoglycemic drugs: Secondary | ICD-10-CM | POA: Diagnosis not present

## 2020-06-01 LAB — CBC WITH DIFFERENTIAL/PLATELET
Abs Immature Granulocytes: 0.03 10*3/uL (ref 0.00–0.07)
Basophils Absolute: 0.1 10*3/uL (ref 0.0–0.1)
Basophils Relative: 1 %
Eosinophils Absolute: 0.3 10*3/uL (ref 0.0–0.5)
Eosinophils Relative: 4 %
HCT: 42.3 % (ref 36.0–46.0)
Hemoglobin: 12.9 g/dL (ref 12.0–15.0)
Immature Granulocytes: 0 %
Lymphocytes Relative: 31 %
Lymphs Abs: 2.3 10*3/uL (ref 0.7–4.0)
MCH: 27.3 pg (ref 26.0–34.0)
MCHC: 30.5 g/dL (ref 30.0–36.0)
MCV: 89.6 fL (ref 80.0–100.0)
Monocytes Absolute: 0.6 10*3/uL (ref 0.1–1.0)
Monocytes Relative: 8 %
Neutro Abs: 4.1 10*3/uL (ref 1.7–7.7)
Neutrophils Relative %: 56 %
Platelets: 90 10*3/uL — ABNORMAL LOW (ref 150–400)
RBC: 4.72 MIL/uL (ref 3.87–5.11)
RDW: 13.1 % (ref 11.5–15.5)
WBC: 7.4 10*3/uL (ref 4.0–10.5)
nRBC: 0 % (ref 0.0–0.2)

## 2020-06-01 LAB — COMPREHENSIVE METABOLIC PANEL
ALT: 23 U/L (ref 0–44)
AST: 25 U/L (ref 15–41)
Albumin: 4.1 g/dL (ref 3.5–5.0)
Alkaline Phosphatase: 86 U/L (ref 38–126)
Anion gap: 13 (ref 5–15)
BUN: 31 mg/dL — ABNORMAL HIGH (ref 8–23)
CO2: 24 mmol/L (ref 22–32)
Calcium: 10.2 mg/dL (ref 8.9–10.3)
Chloride: 102 mmol/L (ref 98–111)
Creatinine, Ser: 1.66 mg/dL — ABNORMAL HIGH (ref 0.44–1.00)
GFR calc Af Amer: 38 mL/min — ABNORMAL LOW (ref >60)
GFR calc non Af Amer: 33 mL/min — ABNORMAL LOW (ref >60)
Glucose, Bld: 170 mg/dL — ABNORMAL HIGH (ref 70–99)
Potassium: 4.4 mmol/L (ref 3.5–5.1)
Sodium: 139 mmol/L (ref 135–145)
Total Bilirubin: 0.3 mg/dL (ref 0.3–1.2)
Total Protein: 8.1 g/dL (ref 6.5–8.1)

## 2020-06-01 LAB — IRON AND TIBC
Iron: 47 ug/dL (ref 28–170)
Saturation Ratios: 11 % (ref 10.4–31.8)
TIBC: 441 ug/dL (ref 250–450)
UIBC: 394 ug/dL

## 2020-06-01 LAB — LACTATE DEHYDROGENASE: LDH: 151 U/L (ref 98–192)

## 2020-06-01 LAB — FOLATE: Folate: 12.8 ng/mL (ref >5.9)

## 2020-06-01 LAB — FERRITIN: Ferritin: 36 ng/mL (ref 11–307)

## 2020-06-01 LAB — VITAMIN B12: Vitamin B-12: 1123 pg/mL — ABNORMAL HIGH (ref 180–914)

## 2020-06-01 LAB — VITAMIN D 25 HYDROXY (VIT D DEFICIENCY, FRACTURES): Vit D, 25-Hydroxy: 23.76 ng/mL — ABNORMAL LOW (ref 30–100)

## 2020-06-02 ENCOUNTER — Inpatient Hospital Stay (HOSPITAL_COMMUNITY): Payer: Medicare Other | Attending: Hematology | Admitting: Nurse Practitioner

## 2020-06-02 DIAGNOSIS — I10 Essential (primary) hypertension: Secondary | ICD-10-CM | POA: Insufficient documentation

## 2020-06-02 DIAGNOSIS — Z8249 Family history of ischemic heart disease and other diseases of the circulatory system: Secondary | ICD-10-CM | POA: Diagnosis not present

## 2020-06-02 DIAGNOSIS — K219 Gastro-esophageal reflux disease without esophagitis: Secondary | ICD-10-CM | POA: Insufficient documentation

## 2020-06-02 DIAGNOSIS — D649 Anemia, unspecified: Secondary | ICD-10-CM | POA: Insufficient documentation

## 2020-06-02 DIAGNOSIS — D696 Thrombocytopenia, unspecified: Secondary | ICD-10-CM | POA: Insufficient documentation

## 2020-06-02 DIAGNOSIS — Z79899 Other long term (current) drug therapy: Secondary | ICD-10-CM | POA: Insufficient documentation

## 2020-06-02 DIAGNOSIS — Z7984 Long term (current) use of oral hypoglycemic drugs: Secondary | ICD-10-CM | POA: Diagnosis not present

## 2020-06-02 DIAGNOSIS — F419 Anxiety disorder, unspecified: Secondary | ICD-10-CM | POA: Insufficient documentation

## 2020-06-02 DIAGNOSIS — E119 Type 2 diabetes mellitus without complications: Secondary | ICD-10-CM | POA: Diagnosis not present

## 2020-06-02 NOTE — Assessment & Plan Note (Addendum)
1.  Mild to moderate thrombocytopenia: -Initial CBC on 08/11/2019 shows platelet count 108.  Hemoglobin 11.3 with normal MCV and normal white count. -It was documented on EMR that her platelet count was 97 on 07/31/2019.  And 93 on 10/30/2018. -She denies any fevers, night sweats, chills or unexplained weight loss. -No easy bruising or bleeding. -She reported she did have one blood transfusion that was many many years ago.  She is unsure of the reason. -She is on vitamin B12 injections and also taking vitamin B12 pills. -Initial work-up was negative.  SPEP was normal.  Hepatitis panel was normal.  Ferritin was low at 44. -Labs done on 06/01/2020 showed WBC 7.4, hemoglobin 12.9 and platelets 90.  RF and ANA are negative -No intervention needed at this time. -She will follow-up in 3 months with repeat labs.  2.  Normocytic anemia: -She has mild anemia between 11 and 12. -She denies any bright red bleeding per rectum or melena. -She is taking vitamin B12 injections monthly and oral vitamin B12. -Labs done on 06/01/2020 showed hemoglobin 12.9. -We will recheck her iron levels at her next visit.

## 2020-06-02 NOTE — Progress Notes (Signed)
[place], Barbara House [address]   CLINIC:  Medical Oncology/Hematology  PCP:  Caren Macadam, [place] [address] [phone]   REASON FOR VISIT: Follow-up for thrombocytopenia   CURRENT THERAPY: Observation   INTERVAL HISTORY:  Barbara House 64 y.o. female returns for routine follow-up for thrombocytopenia.  Patient reports she had a bad rash reaction to her first Covid vaccine that she received at the beginning of August.  She broke out into tiny pinpoint red rash all over her body.  She has since recovered.  She reports she does want to get her second vaccine next month.  She denies any issues with bleeding.  She denies any bright red bleeding per rectum or melena. Denies any nausea, vomiting, or diarrhea. Denies any new pains. Had not noticed any recent bleeding such as epistaxis, hematuria or hematochezia. Denies recent chest pain on exertion, shortness of breath on minimal exertion, pre-syncopal episodes, or palpitations. Denies any numbness or tingling in hands or feet. Denies any recent fevers, infections, or recent hospitalizations. Patient reports appetite at 100% and energy level at 100%.  She is eating well maintain her weight this time.     REVIEW OF SYSTEMS:  Review of Systems  Skin: Positive for rash (Petechiae rash with Covid vaccine).  All other systems reviewed and are negative.    PAST MEDICAL/SURGICAL HISTORY:  Past Medical History:  Diagnosis Date   Allergic rhinitis    Anxiety disorder    Arthritis    GERD (gastroesophageal reflux disease)    Headache    History of migraine    Hypertension    Thrombocytopenia ([place])    Type 2 diabetes mellitus ([place])    Not sure when she was diagnosed, greater than five years.    Past Surgical History:  Procedure Laterality Date   ABDOMINAL HYSTERECTOMY     has ovaries.    CARDIAC CATHETERIZATION N/A 08/03/2016   Procedure: Left Heart Cath and  Coronary Angiography;  Surgeon: Peter M Martinique, MD;  Location: [facility];  Service: Cardiovascular;  Laterality: N/A;   COLONOSCOPY WITH PROPOFOL N/A 11/05/2018   Dr. Oneida Alar: int/ext hemorrhoids, 19mm cecal tubular adenoma removed, next TCS in 5-10 years   ESOPHAGOGASTRODUODENOSCOPY (EGD) WITH PROPOFOL N/A 11/05/2018   Dr. Oneida Alar: esophagus was dilated, mild gastritis with benign bx   Miinor skin surgery     POLYPECTOMY  11/05/2018   Procedure: POLYPECTOMY;  Surgeon: Danie Binder, MD;  [facility];  Service: Endoscopy;;  cecal polyp   SAVORY DILATION N/A 11/05/2018   Procedure: SAVORY DILATION;  Surgeon: Danie Binder, MD;  [facility];  Service: Endoscopy;  Laterality: N/A;     SOCIAL HISTORY:  Social History   Socioeconomic History   Marital status: Single    Spouse name: Not on file   Number of children: 1   Years of education: Not on file   Highest education level: Not on file  Occupational History   Not on file  Tobacco Use   Smoking status: Never Smoker   Smokeless tobacco: Never Used  Vaping Use   Vaping Use: Never used  Substance and Sexual Activity   Alcohol use: No    Alcohol/week: 0.0 standard drinks   Drug use: No   Sexual activity: Not Currently    Birth control/protection: Surgical  Other Topics Concern   Not on file  Social History Narrative   Reports is on  disability. Has one son. Lives in [place]. Enjoys time with family. Enjoys Firefighter. Attends church.    Social Determinants of Health   Financial Resource Strain:    Difficulty of Paying Living Expenses:   Food Insecurity:    Worried About Charity fundraiser in the Last Year:    Arboriculturist in the Last Year:   Transportation Needs:    Film/video editor (Medical):    Lack of Transportation (Non-Medical):   Physical Activity:    Days of Exercise per Week:    Minutes of Exercise per Session:   Stress:    Feeling of Stress :     Social Connections:    Frequency of Communication with Friends and Family:    Frequency of Social Gatherings with Friends and Family:    Attends Religious Services:    Active Member of Clubs or Organizations:    Attends Music therapist:    Marital Status:   Intimate Partner Violence:    Fear of Current or Ex-Partner:    Emotionally Abused:    Physically Abused:    Sexually Abused:     FAMILY HISTORY:  Family History  Problem Relation Age of Onset   Hypertension Mother    Diabetes Mother    Hypertension Son    Diabetes Son    Cancer Father    Cirrhosis Brother    Alcohol abuse Brother    Colon cancer Neg Hx     CURRENT MEDICATIONS:  Outpatient Encounter Medications as of 06/02/2020  Medication Sig   atorvastatin (LIPITOR) 20 MG tablet Take 10 mg by mouth daily.   cetirizine (ZYRTEC) 10 MG tablet Take 10 mg by mouth daily.   chlorthalidone (HYGROTON) 25 MG tablet Take 25 mg by mouth every morning.   Cyanocobalamin (VITAMIN B-12 PO) Take 1 tablet by mouth daily.   cycloSPORINE (RESTASIS) 0.05 % ophthalmic emulsion Place 1 drop into both eyes 2 (two) times daily as needed (for eye dryness.).    dexlansoprazole (DEXILANT) 60 MG capsule 1 PO EVERY MORNING WITH BREAKFAST.   FARXIGA 10 MG TABS tablet Take 10 mg by mouth daily.   GE100 BLOOD GLUCOSE TEST test strip CHECK BLOOD SUGAR ONCE DAILY OR AS DIRECTED BY PHYSICIAN   glipiZIDE (GLUCOTROL XL) 10 MG 24 hr tablet Take 10 mg by mouth every morning.   losartan (COZAAR) 100 MG tablet Take 100 mg by mouth daily.   montelukast (SINGULAIR) 10 MG tablet TAKE 1 TABLET (10 MG TOTAL) BY MOUTH AT BEDTIME AS NEEDED (FOR ALLERGIES.). (Patient taking differently: Take 10 mg by mouth daily. )   nystatin cream (MYCOSTATIN) Apply topically 2 (two) times daily as needed.   PARoxetine (PAXIL) 40 MG tablet TAKE ONE TABLET BY MOUTH EVERY MORNING. (Patient taking differently: Take 40 mg by mouth daily. )    sitaGLIPtin-metformin (JANUMET) 50-1000 MG tablet Take 1 tablet by mouth 2 (two) times daily with a meal.   verapamil (CALAN-SR) 240 MG CR tablet TAKE ONE TABLET BY MOUTH EVERY EVENING. (Patient taking differently: Take 240 mg by mouth daily after breakfast. )   [DISCONTINUED] glipiZIDE (GLUCOTROL) 5 MG tablet Take 5 mg by mouth daily before breakfast.   No facility-administered encounter medications on file as of 06/02/2020.    ALLERGIES:  Allergies  Allergen Reactions   Prednisone Other (See Comments)    Hair loss     PHYSICAL EXAM:  ECOG Performance status: 1  Vitals:   06/02/20 0937  BP: 133/68  Pulse: 67  Resp: 17  Temp: (!) 97.1 F (36.2 C)  SpO2: 99%   Filed Weights   06/02/20 0937  Weight: 199 lb 8 oz (90.5 kg)   Physical Exam Constitutional:      Appearance: Normal appearance. She is normal weight.  Cardiovascular:     Rate and Rhythm: Normal rate and regular rhythm.     Heart sounds: Normal heart sounds.  Pulmonary:     Effort: Pulmonary effort is normal.     Breath sounds: Normal breath sounds.  Abdominal:     General: Bowel sounds are normal.     Palpations: Abdomen is soft.  Musculoskeletal:        General: Normal range of motion.  Skin:    General: Skin is warm.  Neurological:     Mental Status: She is alert and oriented to person, place, and time. Mental status is at baseline.  Psychiatric:        Mood and Affect: Mood normal.        Behavior: Behavior normal.        Thought Content: Thought content normal.        Judgment: Judgment normal.      LABORATORY DATA:  I have reviewed the labs as listed.  CBC    Component Value Date/Time   WBC 7.4 06/01/2020 0923   RBC 4.72 06/01/2020 0923   HGB 12.9 06/01/2020 0923   HGB 11.7 06/25/2018 1548   HCT 42.3 06/01/2020 0923   HCT 34.7 06/25/2018 1548   PLT 90 (L) 06/01/2020 0923   PLT 165 06/25/2018 1548   MCV 89.6 06/01/2020 0923   MCV 85 06/25/2018 1548   MCH 27.3 06/01/2020 0923    MCHC 30.5 06/01/2020 0923   RDW 13.1 06/01/2020 0923   RDW 13.5 06/25/2018 1548   LYMPHSABS 2.3 06/01/2020 0923   LYMPHSABS 2.4 06/25/2018 1548   MONOABS 0.6 06/01/2020 0923   EOSABS 0.3 06/01/2020 0923   EOSABS 0.3 06/25/2018 1548   BASOSABS 0.1 06/01/2020 0923   BASOSABS 0.0 06/25/2018 1548   CMP Latest Ref Rng & Units 06/01/2020 03/29/2020 08/01/2019  Glucose 70 - 99 mg/dL 170(H) 144(H) 242(H)  BUN 8 - 23 mg/dL 31(H) 20 37(H)  Creatinine 0.44 - 1.00 mg/dL 1.66(H) 1.16(H) 1.71(H)  Sodium 135 - 145 mmol/L 139 139 137  Potassium 3.5 - 5.1 mmol/L 4.4 5.0 4.7  Chloride 98 - 111 mmol/L 102 103 106  CO2 22 - 32 mmol/L 24 25 23   Calcium 8.9 - 10.3 mg/dL 10.2 9.7 9.4  Total Protein 6.5 - 8.1 g/dL 8.1 7.4 -  Total Bilirubin 0.3 - 1.2 mg/dL 0.3 0.5 -  Alkaline Phos 38 - 126 U/L 86 81 -  AST 15 - 41 U/L 25 23 -  ALT 0 - 44 U/L 23 21 -    All questions were answered to patient's stated satisfaction. Encouraged patient to call with any new concerns or questions before his next visit to the cancer center and we can certain see him sooner, if needed.     ASSESSMENT & PLAN:  Thrombocytopenia ([place]) 1.  Mild to moderate thrombocytopenia: -Initial CBC on 08/11/2019 shows platelet count 108.  Hemoglobin 11.3 with normal MCV and normal white count. -It was documented on EMR that her platelet count was 97 on 07/31/2019.  And 93 on 10/30/2018. -She denies any fevers, night sweats, chills or unexplained weight loss. -No easy bruising or bleeding. -She reported she did have one blood transfusion that was  many many years ago.  She is unsure of the reason. -She is on vitamin B12 injections and also taking vitamin B12 pills. -Initial work-up was negative.  SPEP was normal.  Hepatitis panel was normal.  Ferritin was low at 44. -Labs done on 06/01/2020 showed WBC 7.4, hemoglobin 12.9 and platelets 90.  RF and ANA are negative -No intervention needed at this time. -She will follow-up in 3 months with repeat  labs.  2.  Normocytic anemia: -She has mild anemia between 11 and 12. -She denies any bright red bleeding per rectum or melena. -She is taking vitamin B12 injections monthly and oral vitamin B12. -Labs done on 06/01/2020 showed hemoglobin 12.9. -We will recheck her iron levels at her next visit.     Orders placed this encounter:  Orders Placed This Encounter  Procedures   Lactate dehydrogenase   CBC with Differential/Platelet   Comprehensive metabolic panel   Vitamin B52   VITAMIN D 25 Hydroxy (Vit-D Deficiency, Fractures)      Barbara Finders, FNP-C [place] [phone]

## 2020-07-07 ENCOUNTER — Ambulatory Visit: Payer: Medicare Other | Admitting: Gastroenterology

## 2020-07-26 ENCOUNTER — Other Ambulatory Visit (HOSPITAL_COMMUNITY): Payer: Self-pay | Admitting: Family Medicine

## 2020-07-26 ENCOUNTER — Other Ambulatory Visit: Payer: Self-pay | Admitting: Family Medicine

## 2020-07-26 DIAGNOSIS — R222 Localized swelling, mass and lump, trunk: Secondary | ICD-10-CM

## 2020-07-30 NOTE — Telephone Encounter (Signed)
error 

## 2020-08-03 ENCOUNTER — Ambulatory Visit (HOSPITAL_COMMUNITY): Payer: Medicare Other

## 2020-08-10 ENCOUNTER — Other Ambulatory Visit (HOSPITAL_COMMUNITY): Payer: Self-pay | Admitting: Family Medicine

## 2020-08-10 ENCOUNTER — Other Ambulatory Visit: Payer: Self-pay

## 2020-08-10 ENCOUNTER — Ambulatory Visit (HOSPITAL_COMMUNITY)
Admission: RE | Admit: 2020-08-10 | Discharge: 2020-08-10 | Disposition: A | Discharge status: Home / Self Care | Payer: Medicare Other | Source: Ambulatory Visit | Attending: Family Medicine | Admitting: Family Medicine

## 2020-08-10 ENCOUNTER — Encounter (HOSPITAL_COMMUNITY): Payer: Self-pay

## 2020-08-10 DIAGNOSIS — R222 Localized swelling, mass and lump, trunk: Secondary | ICD-10-CM

## 2020-08-10 DIAGNOSIS — R102 Pelvic and perineal pain: Secondary | ICD-10-CM

## 2020-08-25 ENCOUNTER — Other Ambulatory Visit (HOSPITAL_COMMUNITY): Payer: Self-pay | Admitting: *Deleted

## 2020-08-25 DIAGNOSIS — D696 Thrombocytopenia, unspecified: Secondary | ICD-10-CM

## 2020-08-26 ENCOUNTER — Inpatient Hospital Stay (HOSPITAL_COMMUNITY): Payer: Medicare Other

## 2020-08-27 ENCOUNTER — Other Ambulatory Visit: Payer: Self-pay

## 2020-08-27 ENCOUNTER — Inpatient Hospital Stay (HOSPITAL_COMMUNITY): Payer: Medicare Other

## 2020-08-27 ENCOUNTER — Inpatient Hospital Stay (HOSPITAL_COMMUNITY): Payer: Medicare Other | Attending: Hematology

## 2020-08-27 ENCOUNTER — Other Ambulatory Visit (HOSPITAL_COMMUNITY): Payer: Medicare Other

## 2020-08-27 DIAGNOSIS — Z90710 Acquired absence of both cervix and uterus: Secondary | ICD-10-CM | POA: Insufficient documentation

## 2020-08-27 DIAGNOSIS — Z7984 Long term (current) use of oral hypoglycemic drugs: Secondary | ICD-10-CM | POA: Insufficient documentation

## 2020-08-27 DIAGNOSIS — F419 Anxiety disorder, unspecified: Secondary | ICD-10-CM | POA: Diagnosis not present

## 2020-08-27 DIAGNOSIS — I10 Essential (primary) hypertension: Secondary | ICD-10-CM | POA: Insufficient documentation

## 2020-08-27 DIAGNOSIS — D696 Thrombocytopenia, unspecified: Secondary | ICD-10-CM | POA: Diagnosis not present

## 2020-08-27 DIAGNOSIS — D649 Anemia, unspecified: Secondary | ICD-10-CM | POA: Insufficient documentation

## 2020-08-27 DIAGNOSIS — Z79899 Other long term (current) drug therapy: Secondary | ICD-10-CM | POA: Insufficient documentation

## 2020-08-27 DIAGNOSIS — E119 Type 2 diabetes mellitus without complications: Secondary | ICD-10-CM | POA: Diagnosis not present

## 2020-08-27 DIAGNOSIS — E559 Vitamin D deficiency, unspecified: Secondary | ICD-10-CM | POA: Insufficient documentation

## 2020-08-27 DIAGNOSIS — K219 Gastro-esophageal reflux disease without esophagitis: Secondary | ICD-10-CM | POA: Diagnosis not present

## 2020-08-27 LAB — CBC WITH DIFFERENTIAL/PLATELET
Abs Immature Granulocytes: 0.01 10*3/uL (ref 0.00–0.07)
Basophils Absolute: 0.1 10*3/uL (ref 0.0–0.1)
Basophils Relative: 1 %
Eosinophils Absolute: 0.3 10*3/uL (ref 0.0–0.5)
Eosinophils Relative: 4 %
HCT: 39.9 % (ref 36.0–46.0)
Hemoglobin: 12.4 g/dL (ref 12.0–15.0)
Immature Granulocytes: 0 %
Lymphocytes Relative: 22 %
Lymphs Abs: 1.4 10*3/uL (ref 0.7–4.0)
MCH: 27.6 pg (ref 26.0–34.0)
MCHC: 31.1 g/dL (ref 30.0–36.0)
MCV: 88.9 fL (ref 80.0–100.0)
Monocytes Absolute: 0.4 10*3/uL (ref 0.1–1.0)
Monocytes Relative: 6 %
Neutro Abs: 4.3 10*3/uL (ref 1.7–7.7)
Neutrophils Relative %: 67 %
Platelets: 93 10*3/uL — ABNORMAL LOW (ref 150–400)
RBC: 4.49 MIL/uL (ref 3.87–5.11)
RDW: 13.2 % (ref 11.5–15.5)
WBC: 6.4 10*3/uL (ref 4.0–10.5)
nRBC: 0 % (ref 0.0–0.2)

## 2020-08-27 LAB — COMPREHENSIVE METABOLIC PANEL
ALT: 22 U/L (ref 0–44)
AST: 33 U/L (ref 15–41)
Albumin: 4.2 g/dL (ref 3.5–5.0)
Alkaline Phosphatase: 84 U/L (ref 38–126)
Anion gap: 13 (ref 5–15)
BUN: 36 mg/dL — ABNORMAL HIGH (ref 8–23)
CO2: 21 mmol/L — ABNORMAL LOW (ref 22–32)
Calcium: 10.2 mg/dL (ref 8.9–10.3)
Chloride: 104 mmol/L (ref 98–111)
Creatinine, Ser: 1.71 mg/dL — ABNORMAL HIGH (ref 0.44–1.00)
GFR, Estimated: 33 mL/min — ABNORMAL LOW (ref >60)
Glucose, Bld: 133 mg/dL — ABNORMAL HIGH (ref 70–99)
Potassium: 4.2 mmol/L (ref 3.5–5.1)
Sodium: 138 mmol/L (ref 135–145)
Total Bilirubin: 0.4 mg/dL (ref 0.3–1.2)
Total Protein: 7.8 g/dL (ref 6.5–8.1)

## 2020-08-27 LAB — VITAMIN B12: Vitamin B-12: 914 pg/mL (ref 180–914)

## 2020-08-27 LAB — VITAMIN D 25 HYDROXY (VIT D DEFICIENCY, FRACTURES): Vit D, 25-Hydroxy: 36.36 ng/mL (ref 30–100)

## 2020-08-27 LAB — LACTATE DEHYDROGENASE: LDH: 161 U/L (ref 98–192)

## 2020-09-02 ENCOUNTER — Other Ambulatory Visit: Payer: Self-pay

## 2020-09-02 ENCOUNTER — Inpatient Hospital Stay (HOSPITAL_BASED_OUTPATIENT_CLINIC_OR_DEPARTMENT_OTHER): Payer: Medicare Other | Admitting: Oncology

## 2020-09-02 VITALS — BP 109/68 | HR 66 | Temp 97.0°F | Resp 18 | Wt 200.3 lb

## 2020-09-02 DIAGNOSIS — N179 Acute kidney failure, unspecified: Secondary | ICD-10-CM

## 2020-09-02 DIAGNOSIS — D696 Thrombocytopenia, unspecified: Secondary | ICD-10-CM

## 2020-09-02 NOTE — Progress Notes (Signed)
Barbara House, Barbara House [address]   CLINIC:  Medical Oncology/Hematology  PCP:  Barbara House, [place] [address] [phone]   REASON FOR VISIT: Follow-up for thrombocytopenia   CURRENT THERAPY: Observation   INTERVAL HISTORY:  Barbara House 64 y.o. female returns for routine follow-up for thrombocytopenia.  Endorses a rash after both COVID-19 vaccines.  Endorses chronic sinus problems that cause daily headaches and occasional dizziness.  She feels like her sleep is stable.  Has occasional diarrhea secondary to "things I eat".  She has trouble swallowing occasionally due to soreness of her gums.  She denies any issues with bleeding.  She denies any bright red bleeding per rectum or melena. Denies any nausea, vomiting, or diarrhea. Denies any new pains. Had not noticed any recent bleeding such as epistaxis, hematuria or hematochezia. Denies recent chest pain on exertion, shortness of breath on minimal exertion, pre-syncopal episodes, or palpitations. Denies any numbness or tingling in hands or feet. Denies any recent fevers, infections, or recent hospitalizations. Patient reports appetite at 100% and energy level at 100%.  She is eating well maintain her weight this time.  REVIEW OF SYSTEMS:  Review of Systems  Constitutional: Positive for fatigue. Negative for appetite change, fever and unexpected weight change.  HENT:   Negative for nosebleeds, sore throat and trouble swallowing.        Trouble chewing  Eyes: Negative.   Respiratory: Negative.  Negative for cough, shortness of breath and wheezing.        Sinus congestion  Cardiovascular: Negative.  Negative for chest pain and leg swelling.  Gastrointestinal: Positive for diarrhea (With certain foods). Negative for abdominal pain, blood in stool, constipation, nausea and vomiting.  Endocrine: Negative.   Genitourinary: Negative.  Negative for bladder incontinence, hematuria  and nocturia.   Musculoskeletal: Negative.  Negative for back pain and flank pain.  Skin: Negative.   Neurological: Positive for dizziness and headaches. Negative for light-headedness and numbness.  Hematological: Negative.   Psychiatric/Behavioral: Negative.  Negative for confusion. The patient is not nervous/anxious.      PAST MEDICAL/SURGICAL HISTORY:  Past Medical History:  Diagnosis Date  . Allergic rhinitis   . Anxiety disorder   . Arthritis   . GERD (gastroesophageal reflux disease)   . Headache   . History of migraine   . Hypertension   . Thrombocytopenia ([place])   . Type 2 diabetes mellitus ([place])    Not sure when she was diagnosed, greater than five years.    Past Surgical History:  Procedure Laterality Date  . ABDOMINAL HYSTERECTOMY     has ovaries.   [name] CARDIAC CATHETERIZATION N/A 08/03/2016   Procedure: Left Heart Cath and Coronary Angiography;  Surgeon: Peter M Martinique, MD;  Location: [facility];  Service: Cardiovascular;  Laterality: N/A;  . COLONOSCOPY WITH PROPOFOL N/A 11/05/2018   Dr. Oneida Alar: int/ext hemorrhoids, 53mm cecal tubular adenoma removed, next TCS in 5-10 years  . ESOPHAGOGASTRODUODENOSCOPY (EGD) WITH PROPOFOL N/A 11/05/2018   Dr. Oneida Alar: esophagus was dilated, mild gastritis with benign bx  . Miinor skin surgery    . POLYPECTOMY  11/05/2018   Procedure: POLYPECTOMY;  Surgeon: Danie Binder, MD;  [facility];  Service: Endoscopy;;  cecal polyp  . SAVORY DILATION N/A 11/05/2018   Procedure: SAVORY DILATION;  Surgeon: Danie Binder, MD;  [facility];  Service: Endoscopy;  Laterality: N/A;     SOCIAL HISTORY:  Social History   Socioeconomic History  . Marital status: Single    Spouse name: Not on file  . Number of children: 1  . Years of education: Not on file  . Highest education level: Not on file  Occupational History  . Not on file  Tobacco Use  . Smoking status: Never Smoker  . Smokeless tobacco: Never Used    Vaping Use  . Vaping Use: Never used  Substance and Sexual Activity  . Alcohol use: No    Alcohol/week: 0.0 standard drinks  . Drug use: No  . Sexual activity: Not Currently    Birth control/protection: Surgical  Other Topics Concern  . Not on file  Social History Narrative   Reports is on disability. Has one son. Lives in [place]. Enjoys time with family. Enjoys Firefighter. Attends church.    Social Determinants of Health   Financial Resource Strain:   . Difficulty of Paying Living Expenses: Not on file  Food Insecurity:   . Worried About Charity fundraiser in the Last Year: Not on file  . Ran Out of Food in the Last Year: Not on file  Transportation Needs:   . Lack of Transportation (Medical): Not on file  . Lack of Transportation (Non-Medical): Not on file  Physical Activity:   . Days of Exercise per Week: Not on file  . Minutes of Exercise per Session: Not on file  Stress:   . Feeling of Stress : Not on file  Social Connections:   . Frequency of Communication with Friends and Family: Not on file  . Frequency of Social Gatherings with Friends and Family: Not on file  . Attends Religious Services: Not on file  . Active Member of Clubs or Organizations: Not on file  . Attends Archivist Meetings: Not on file  . Marital Status: Not on file  Intimate Partner Violence:   . Fear of Current or Ex-Partner: Not on file  . Emotionally Abused: Not on file  . Physically Abused: Not on file  . Sexually Abused: Not on file    FAMILY HISTORY:  Family History  Problem Relation Age of Onset  . Hypertension Mother   . Diabetes Mother   . Hypertension Son   . Diabetes Son   . Cancer Father   . Cirrhosis Brother   . Alcohol abuse Brother   . Colon cancer Neg Hx     CURRENT MEDICATIONS:  Outpatient Encounter Medications as of 09/02/2020  Medication Sig  . atorvastatin (LIPITOR) 20 MG tablet Take 10 mg by mouth daily.  . cetirizine (ZYRTEC) 10 MG tablet Take 10 mg  by mouth daily.  . chlorthalidone (HYGROTON) 25 MG tablet Take 25 mg by mouth every morning.  . Cyanocobalamin (VITAMIN B-12 PO) Take 1 tablet by mouth daily.  . cycloSPORINE (RESTASIS) 0.05 % ophthalmic emulsion Place 1 drop into both eyes 2 (two) times daily as needed (for eye dryness.).   [name] dexlansoprazole (DEXILANT) 60 MG capsule 1 PO EVERY MORNING WITH BREAKFAST.  [name] FARXIGA 10 MG TABS tablet Take 10 mg by mouth daily.  . Fluocinolone Acetonide 0.01 % OIL Place in ear(s).  . GE100 BLOOD GLUCOSE TEST test strip CHECK BLOOD SUGAR ONCE DAILY OR AS DIRECTED BY PHYSICIAN  . glipiZIDE (GLUCOTROL XL) 10 MG 24 hr tablet Take 10 mg by mouth every morning.  [name] ipratropium (ATROVENT) 0.03 % nasal spray SMARTSIG:2 Spray(s) Both Nares 3 Times Daily PRN  . losartan (COZAAR) 100 MG tablet Take  100 mg by mouth daily.  . montelukast (SINGULAIR) 10 MG tablet TAKE 1 TABLET (10 MG TOTAL) BY MOUTH AT BEDTIME AS NEEDED (FOR ALLERGIES.). (Patient taking differently: Take 10 mg by mouth daily. )  . nystatin cream (MYCOSTATIN) Apply topically 2 (two) times daily as needed.  [name] PARoxetine (PAXIL) 40 MG tablet TAKE ONE TABLET BY MOUTH EVERY MORNING. (Patient taking differently: Take 40 mg by mouth daily. )  . sitaGLIPtin-metformin (JANUMET) 50-1000 MG tablet Take 1 tablet by mouth 2 (two) times daily with a meal.  . verapamil (CALAN-SR) 240 MG CR tablet TAKE ONE TABLET BY MOUTH EVERY EVENING. (Patient taking differently: Take 240 mg by mouth daily after breakfast. )   No facility-administered encounter medications on file as of 09/02/2020.    ALLERGIES:  Allergies  Allergen Reactions  . Prednisone Other (See Comments)    Hair loss     PHYSICAL EXAM:  ECOG Performance status: 1  Vitals:   09/02/20 1326  BP: 109/68  Pulse: 66  Resp: 18  Temp: (!) 97 F (36.1 C)  SpO2: 100%   Filed Weights   09/02/20 1326  Weight: 200 lb 4.8 oz (90.9 kg)   Physical Exam Constitutional:      Appearance: Normal  appearance. She is normal weight.  HENT:     Head: Normocephalic and atraumatic.  Eyes:     Pupils: Pupils are equal, round, and reactive to light.  Cardiovascular:     Rate and Rhythm: Normal rate and regular rhythm.     Heart sounds: Normal heart sounds. No murmur heard.   Pulmonary:     Effort: Pulmonary effort is normal.     Breath sounds: Normal breath sounds. No wheezing.  Abdominal:     General: Bowel sounds are normal. There is no distension.     Palpations: Abdomen is soft.     Tenderness: There is no abdominal tenderness.  Musculoskeletal:        General: Normal range of motion.     Cervical back: Normal range of motion.  Skin:    General: Skin is warm and dry.     Findings: No rash.  Neurological:     Mental Status: She is alert and oriented to person, place, and time. Mental status is at baseline.  Psychiatric:        Mood and Affect: Mood normal.        Behavior: Behavior normal.        Thought Content: Thought content normal.        Judgment: Judgment normal.      LABORATORY DATA:  I have reviewed the labs as listed.  CBC    Component Value Date/Time   WBC 6.4 08/27/2020 1218   RBC 4.49 08/27/2020 1218   HGB 12.4 08/27/2020 1218   HGB 11.7 06/25/2018 1548   HCT 39.9 08/27/2020 1218   HCT 34.7 06/25/2018 1548   PLT 93 (L) 08/27/2020 1218   PLT 165 06/25/2018 1548   MCV 88.9 08/27/2020 1218   MCV 85 06/25/2018 1548   MCH 27.6 08/27/2020 1218   MCHC 31.1 08/27/2020 1218   RDW 13.2 08/27/2020 1218   RDW 13.5 06/25/2018 1548   LYMPHSABS 1.4 08/27/2020 1218   LYMPHSABS 2.4 06/25/2018 1548   MONOABS 0.4 08/27/2020 1218   EOSABS 0.3 08/27/2020 1218   EOSABS 0.3 06/25/2018 1548   BASOSABS 0.1 08/27/2020 1218   BASOSABS 0.0 06/25/2018 1548   CMP Latest Ref Rng & Units 08/27/2020 06/01/2020 03/29/2020  Glucose 70 - 99 mg/dL 133(H) 170(H) 144(H)  BUN 8 - 23 mg/dL 36(H) 31(H) 20  Creatinine 0.44 - 1.00 mg/dL 1.71(H) 1.66(H) 1.16(H)  Sodium 135 - 145  mmol/L 138 139 139  Potassium 3.5 - 5.1 mmol/L 4.2 4.4 5.0  Chloride 98 - 111 mmol/L 104 102 103  CO2 22 - 32 mmol/L 21(L) 24 25  Calcium 8.9 - 10.3 mg/dL 10.2 10.2 9.7  Total Protein 6.5 - 8.1 g/dL 7.8 8.1 7.4  Total Bilirubin 0.3 - 1.2 mg/dL 0.4 0.3 0.5  Alkaline Phos 38 - 126 U/L 84 86 81  AST 15 - 41 U/L 33 25 23  ALT 0 - 44 U/L 22 23 21     All questions were answered to patient's stated satisfaction. Encouraged patient to call with any new concerns or questions before his next visit to the cancer center and we can certain see him sooner, if needed.     ASSESSMENT & PLAN:  1.  Mild to moderate thrombocytopenia: -Initial CBC on 08/11/2019 shows platelet count 108.  Hemoglobin 11.3 with normal MCV and normal white count. -It was documented on EMR that her platelet count was 97 on 07/31/2019.  And 93 on 10/30/2018. -She denies any fevers, night sweats, chills or unexplained weight loss. -No easy bruising or bleeding. -She reported she did have one blood transfusion that was many many years ago.  She is unsure of the reason. -She is on vitamin B12 injections and also taking vitamin B12 pills. -Initial work-up was negative.  SPEP was normal.  Hepatitis panel was normal.  Ferritin was low at 44. -Labs done on 06/01/2020 showed WBC 7.4, hemoglobin 12.9 and platelets 90.  RF and ANA are negative -Labs from 08/27/2020 show a white count of 6.4, hemoglobin 12.4 and a platelet count of 93.  Lab work appears fairly stable. -No intervention needed at this time. -She will follow-up in 3 months with repeat labs.  2.  Normocytic anemia: -She has mild anemia between 11 and 12.  -She denies any bright red bleeding per rectum or melena. -She is taking vitamin B12 injections monthly and oral vitamin B12. -Labs done on 06/01/2020 showed hemoglobin 12.9.  3.  Elevated creatinine: -Slight bump in creatinine at 1.71 with a BUN of 36.  Previously 1.66 BUN of 31. -I have recommended she increase her  fluid intake and we will recheck her labs in 3 months. -If it continues to increase, would recommend nephrology consult.  4.  Vitamin D deficiency: -Previously had low vitamin D level 23.76. -Most recent vitamin D level 36.36.  Continue vitamin D supplementation.  Disposition: -Return to clinic in 3 months with repeat lab work (CBC, CMP, vitamin D, vitamin B12, LDH) and assessment.  Greater than 50% was spent in counseling and coordination of care with this patient including but not limited to discussion of the relevant topics above (See A&P) including, but not limited to diagnosis and management of acute and chronic medical conditions.   No problem-specific Assessment & Plan notes found for this encounter.  Orders placed this encounter:  No orders of the defined types were placed in this encounter.  Faythe Casa, NP 09/02/2020 1:57 PM  [place] [phone]

## 2020-09-22 ENCOUNTER — Other Ambulatory Visit (HOSPITAL_COMMUNITY): Payer: Self-pay | Admitting: Family Medicine

## 2020-09-22 DIAGNOSIS — R222 Localized swelling, mass and lump, trunk: Secondary | ICD-10-CM

## 2020-09-24 ENCOUNTER — Ambulatory Visit: Payer: Self-pay

## 2020-09-24 NOTE — Telephone Encounter (Signed)
Patient called and says she has left hip pain from a pull to the toes on her left foot. She says on Sunday walking in heels, she felt a pull to the 2nd toe on left foot and the pain went up into the left hip. She says the hip hurts when bending and it's hard to stand straight up. She says she's been walking around and getting wood off the porch. She says the pain is sharp and burns up her leg into the hip and buttock. She says the pain is off and on. I advised to call PCP on Monday to schedule OV, care advice given, patient verbalized understanding.  Reason for Disposition . [1] MODERATE pain (e.g., interferes with normal activities, limping) AND [2] present > 3 days  Answer Assessment - Initial Assessment Questions 1. LOCATION and RADIATION: "Where is the pain located?"      Left hip 2. QUALITY: "What does the pain feel like?"  (e.g., sharp, dull, aching, burning)     Sharp, burning pain 3. SEVERITY: "How bad is the pain?" "What does it keep you from doing?"   (Scale 1-10; or mild, moderate, severe)   -  MILD (1-3): doesn't interfere with normal activities    -  MODERATE (4-7): interferes with normal activities (e.g., work or school) or awakens from sleep, limping    -  SEVERE (8-10): excruciating pain, unable to do any normal activities, unable to walk     8-10 4. ONSET: "When did the pain start?" "Does it come and go, or is it there all the time?"     Last Sunday  5. WORK OR EXERCISE: "Has there been any recent work or exercise that involved this part of the body?"      Felt a pull in left toes when walking in heels on Sunday  6. CAUSE: "What do you think is causing the hip pain?"      I don't know 7. AGGRAVATING FACTORS: "What makes the hip pain worse?" (e.g., walking, climbing stairs, running)     Bending, walking around 8. OTHER SYMPTOMS: "Do you have any other symptoms?" (e.g., back pain, pain shooting down leg,  fever, rash)     Pain shooting up leg to buttock from foot  Protocols  used: HIP PAIN-A-AH

## 2020-09-30 ENCOUNTER — Ambulatory Visit (HOSPITAL_COMMUNITY)
Admission: RE | Admit: 2020-09-30 | Discharge: 2020-09-30 | Disposition: A | Discharge status: Home / Self Care | Payer: Medicare Other | Source: Ambulatory Visit | Attending: Family Medicine | Admitting: Family Medicine

## 2020-09-30 ENCOUNTER — Other Ambulatory Visit: Payer: Self-pay

## 2020-09-30 DIAGNOSIS — R102 Pelvic and perineal pain: Secondary | ICD-10-CM

## 2020-10-05 ENCOUNTER — Ambulatory Visit (INDEPENDENT_AMBULATORY_CARE_PROVIDER_SITE_OTHER): Payer: Medicare Other | Admitting: Orthopedic Surgery

## 2020-10-05 ENCOUNTER — Other Ambulatory Visit: Payer: Self-pay

## 2020-10-05 ENCOUNTER — Encounter: Payer: Self-pay | Admitting: Orthopedic Surgery

## 2020-10-05 VITALS — BP 124/77 | HR 65 | Ht 61.0 in | Wt 196.0 lb

## 2020-10-05 DIAGNOSIS — M65331 Trigger finger, right middle finger: Secondary | ICD-10-CM | POA: Diagnosis not present

## 2020-10-05 NOTE — Patient Instructions (Signed)

## 2020-10-05 NOTE — Progress Notes (Signed)
New Patient Visit  Assessment: Barbara House is a 64 y.o. female with the following: Right long finger trigger finger  Plan: Barbara House has had pain and stiffness, with triggering to the right long finger for a few months.  Her symptoms are worse in the morning.  After discussing the pathology, and potential treatment options, I recommended proceeding with a steroid injection.  She has elected to proceed.  If this improves her symptoms for several months, she could return for repeat injection.  However, if she does not get sustained relief, I would recommend proceeding with a surgical procedure.  All questions were answered, and she is amenable to this plan.  Follow-up will be as needed at this time.  Although, she did state that she has been having some issues with her left elbow.  If she wishes to be seen for her left elbow, she can contact the clinic to schedule an appointment.  Procedure note injection right long finger A1 pulley    Verbal consent was obtained to inject the right long finger,  Timeout was completed to confirm the site of injection.  The skin was prepped with alcohol and ethyl chloride was sprayed at the injection site.  A 21-gauge needle was used to inject 20 mg of Depo-Medrol and 1% lidocaine (0.5 cc) into the A1 pulley to the right long finger.  There were no complications. A sterile bandage was applied.     Follow-up: Return in 4 weeks (on 11/02/2020), or if symptoms worsen or fail to improve.  Subjective:  Chief Complaint  Patient presents with  . Hand Pain    Right hand, has stiffness some days and none others, comes and goes, x 3 months at least.     History of Present Illness: Barbara House is a 65 y.o. RHD female who was referred to clinic today by Aliene Beams, MD for evaluation of her right long finger.  The patient states she has had pain, stiffness and a catching sensation in the long finger for the past 3 months.  She denies a history of an  injury.  Her symptoms are worse in the morning.  She has not tried a brace, therapy or any medications.  This is never happened to her previously.  She states that the catching sensation is severe enough, that it feels like her finger is going to "snap".  She also has some stiffness in her right thumb in the DIP joints to several of her fingers.  She has noted nodules in these areas   Review of Systems: No fevers or chills No numbness or tingling No chest pain No shortness of breath No bowel or bladder dysfunction No GI distress No headaches   Medical History:  Past Medical History:  Diagnosis Date  . Allergic rhinitis   . Anxiety disorder   . Arthritis   . GERD (gastroesophageal reflux disease)   . Headache   . History of migraine   . Hypertension   . Thrombocytopenia (HCC)   . Type 2 diabetes mellitus (HCC)    Not sure when she was diagnosed, greater than five years.     Past Surgical History:  Procedure Laterality Date  . ABDOMINAL HYSTERECTOMY     has ovaries.   [name] CARDIAC CATHETERIZATION N/A 08/03/2016   Procedure: Left Heart Cath and Coronary Angiography;  Surgeon: Peter M Swaziland, MD;  Location: [facility] INVASIVE CV [facility];  Service: Cardiovascular;  Laterality: N/A;  . COLONOSCOPY WITH PROPOFOL N/A 11/05/2018  Dr. Oneida Alar: int/ext hemorrhoids, 3mm cecal tubular adenoma removed, next TCS in 5-10 years  . ESOPHAGOGASTRODUODENOSCOPY (EGD) WITH PROPOFOL N/A 11/05/2018   Dr. Oneida Alar: esophagus was dilated, mild gastritis with benign bx  . Miinor skin surgery    . POLYPECTOMY  11/05/2018   Procedure: POLYPECTOMY;  Surgeon: Danie Binder, MD;  [facility];  Service: Endoscopy;;  cecal polyp  . SAVORY DILATION N/A 11/05/2018   Procedure: SAVORY DILATION;  Surgeon: Danie Binder, MD;  Location: [facility];  Service: Endoscopy;  Laterality: N/A;    Family History  Problem Relation Age of Onset  . Hypertension Mother   . Diabetes Mother   . Hypertension Son   .  Diabetes Son   . Cancer Father   . Cirrhosis Brother   . Alcohol abuse Brother   . Colon cancer Neg Hx    Social History   Tobacco Use  . Smoking status: Never Smoker  . Smokeless tobacco: Never Used  Vaping Use  . Vaping Use: Never used  Substance Use Topics  . Alcohol use: No    Alcohol/week: 0.0 standard drinks  . Drug use: No    Allergies  Allergen Reactions  . Prednisone Other (See Comments)    Hair loss    Current Meds  Medication Sig  . atorvastatin (LIPITOR) 20 MG tablet Take 10 mg by mouth daily.  . cetirizine (ZYRTEC) 10 MG tablet Take 10 mg by mouth daily.  . chlorthalidone (HYGROTON) 25 MG tablet Take 25 mg by mouth every morning.  . Cyanocobalamin (VITAMIN B-12 PO) Take 1 tablet by mouth daily.  . cycloSPORINE (RESTASIS) 0.05 % ophthalmic emulsion Place 1 drop into both eyes 2 (two) times daily as needed (for eye dryness.).   [name] dexlansoprazole (DEXILANT) 60 MG capsule 1 PO EVERY MORNING WITH BREAKFAST.  [name] FARXIGA 10 MG TABS tablet Take 10 mg by mouth daily.  . Fluocinolone Acetonide 0.01 % OIL Place in ear(s).  . GE100 BLOOD GLUCOSE TEST test strip CHECK BLOOD SUGAR ONCE DAILY OR AS DIRECTED BY PHYSICIAN  . glipiZIDE (GLUCOTROL XL) 10 MG 24 hr tablet Take 10 mg by mouth every morning.  [name] ipratropium (ATROVENT) 0.03 % nasal spray SMARTSIG:2 Spray(s) Both Nares 3 Times Daily PRN  . losartan (COZAAR) 100 MG tablet Take 100 mg by mouth daily.  . montelukast (SINGULAIR) 10 MG tablet TAKE 1 TABLET (10 MG TOTAL) BY MOUTH AT BEDTIME AS NEEDED (FOR ALLERGIES.). (Patient taking differently: Take 10 mg by mouth daily.)  . nystatin cream (MYCOSTATIN) Apply topically 2 (two) times daily as needed.  [name] PARoxetine (PAXIL) 40 MG tablet TAKE ONE TABLET BY MOUTH EVERY MORNING. (Patient taking differently: Take 40 mg by mouth daily.)  . sitaGLIPtin-metformin (JANUMET) 50-1000 MG tablet Take 1 tablet by mouth 2 (two) times daily with a meal.  . verapamil (CALAN-SR) 240 MG CR tablet  TAKE ONE TABLET BY MOUTH EVERY EVENING. (Patient taking differently: Take 240 mg by mouth daily after breakfast.)    Objective: BP 124/77   Pulse 65   Ht 5\' 1"  (1.549 m)   Wt 196 lb (88.9 kg)   BMI 37.03 kg/m   Physical Exam:  General: Alert and oriented, no acute distress Gait: Normal  Evaluation of the right hand demonstrates no obvious deformity.  She has some nodules at the thumb IP joint, as well as the DIP joint to all of her fingers.  These are mildly tender to palpation.  There is tenderness to  palpation overlying the A1 pulley to the long finger.  She is actively triggering in clinic today.  Fingers are warm and well-perfused.  Sensation is intact distally.    IMAGING: No new imaging obtained today   New Medications:  No orders of the defined types were placed in this encounter.     Mordecai Rasmussen, MD  10/05/2020 3:10 PM

## 2020-11-03 ENCOUNTER — Ambulatory Visit: Payer: Medicare Other | Admitting: Gastroenterology

## 2020-11-03 ENCOUNTER — Ambulatory Visit: Payer: Medicare Other | Admitting: Orthopedic Surgery

## 2020-11-09 ENCOUNTER — Ambulatory Visit (INDEPENDENT_AMBULATORY_CARE_PROVIDER_SITE_OTHER): Payer: Medicare Other | Admitting: Orthopedic Surgery

## 2020-11-09 ENCOUNTER — Other Ambulatory Visit: Payer: Self-pay | Admitting: Orthopedic Surgery

## 2020-11-09 ENCOUNTER — Encounter: Payer: Self-pay | Admitting: Orthopedic Surgery

## 2020-11-09 ENCOUNTER — Other Ambulatory Visit: Payer: Self-pay

## 2020-11-09 ENCOUNTER — Ambulatory Visit: Payer: Medicare Other

## 2020-11-09 VITALS — BP 95/75 | HR 89 | Ht 61.0 in | Wt 194.0 lb

## 2020-11-09 DIAGNOSIS — M7702 Medial epicondylitis, left elbow: Secondary | ICD-10-CM

## 2020-11-09 DIAGNOSIS — M25522 Pain in left elbow: Secondary | ICD-10-CM

## 2020-11-09 MED ORDER — DICLOFENAC SODIUM 1 % EX GEL: 2.0000 g | Freq: Four times a day (QID) | CUTANEOUS | 1 refills | Status: DC

## 2020-11-09 NOTE — Progress Notes (Signed)
Orthopaedic Clinic Return  Assessment: Barbara House is a 65 y.o. female with the following: Left elbow medial epicondylitis  Plan: XR left elbow without fracture or evidence of arthritis TTP over medial epicondyle, palpation recreates her pain Provided prescription for voltaren gel; gave her some exercises for her forearm muscles, similar to lateral epicondylitis. Follow up as needed   Meds ordered this encounter  Medications  . diclofenac Sodium (VOLTAREN) 1 % GEL    Sig: Apply 2 g topically 4 (four) times daily. Apply to medial left elbow    Dispense:  150 g    Refill:  1    Body mass index is 36.66 kg/m.  Follow-up: No follow-ups on file.   Subjective:  Chief Complaint  Patient presents with  . Arm Pain    Lt forearm and elbow pain for over a yr NKI. Pain is back and forth but has a constant soreness.     History of Present Illness: Barbara House is a 65 y.o. female who presents for evaluation of her left elbow.  I saw her about a month ago for a trigger finger on her right hand.  This has improved since getting the injection.  She is now complaining of pain in her medial left elbow.  She denies an injury.  Sometimes she is unable to put any pressure on the inside part of her elbow.  She occasionally has some numbness in her hand, but this is inconsistent.  No medicines for her elbow.   Review of Systems: No fevers or chills No numbness or tingling No chest pain No shortness of breath No bowel or bladder dysfunction No GI distress No headaches    Objective: BP 95/75   Pulse 89   Ht 5\' 1"  (1.549 m)   Wt 194 lb (88 kg)   BMI 36.66 kg/m   Physical Exam:  Alert and oriented, no acute distress  Left elbow without deformity.  Full active range of motion, including pronation and supination.  TTP over the common flexors and medial epicondyle.  Pain with resisted wrist flexion.   IMAGING: I personally ordered and reviewed the following images:  XR  of the left elbow is without acute fracture.  No degenerative changes are noted.  Normal alignment.   Impression: normal left elbow   Mordecai Rasmussen, MD 11/09/2020 1:45 PM

## 2020-11-14 DIAGNOSIS — I10 Essential (primary) hypertension: Secondary | ICD-10-CM | POA: Diagnosis not present

## 2020-11-15 DIAGNOSIS — I10 Essential (primary) hypertension: Secondary | ICD-10-CM | POA: Diagnosis not present

## 2020-11-17 DIAGNOSIS — R350 Frequency of micturition: Secondary | ICD-10-CM | POA: Diagnosis not present

## 2020-11-17 DIAGNOSIS — N200 Calculus of kidney: Secondary | ICD-10-CM | POA: Diagnosis not present

## 2020-11-17 DIAGNOSIS — R351 Nocturia: Secondary | ICD-10-CM | POA: Diagnosis not present

## 2020-11-22 DIAGNOSIS — N183 Chronic kidney disease, stage 3 unspecified: Secondary | ICD-10-CM | POA: Diagnosis not present

## 2020-11-22 DIAGNOSIS — E1122 Type 2 diabetes mellitus with diabetic chronic kidney disease: Secondary | ICD-10-CM | POA: Diagnosis not present

## 2020-11-22 DIAGNOSIS — I129 Hypertensive chronic kidney disease with stage 1 through stage 4 chronic kidney disease, or unspecified chronic kidney disease: Secondary | ICD-10-CM | POA: Diagnosis not present

## 2020-12-06 DIAGNOSIS — N200 Calculus of kidney: Secondary | ICD-10-CM | POA: Diagnosis not present

## 2020-12-07 ENCOUNTER — Other Ambulatory Visit (HOSPITAL_COMMUNITY): Payer: Self-pay | Admitting: *Deleted

## 2020-12-07 DIAGNOSIS — D696 Thrombocytopenia, unspecified: Secondary | ICD-10-CM

## 2020-12-08 ENCOUNTER — Inpatient Hospital Stay (HOSPITAL_COMMUNITY): Payer: Medicare Other | Attending: Hematology

## 2020-12-08 ENCOUNTER — Other Ambulatory Visit: Payer: Self-pay

## 2020-12-08 DIAGNOSIS — E559 Vitamin D deficiency, unspecified: Secondary | ICD-10-CM | POA: Insufficient documentation

## 2020-12-08 DIAGNOSIS — E119 Type 2 diabetes mellitus without complications: Secondary | ICD-10-CM | POA: Insufficient documentation

## 2020-12-08 DIAGNOSIS — D696 Thrombocytopenia, unspecified: Secondary | ICD-10-CM | POA: Diagnosis not present

## 2020-12-08 DIAGNOSIS — Z79899 Other long term (current) drug therapy: Secondary | ICD-10-CM | POA: Insufficient documentation

## 2020-12-08 DIAGNOSIS — I10 Essential (primary) hypertension: Secondary | ICD-10-CM | POA: Diagnosis not present

## 2020-12-08 DIAGNOSIS — D649 Anemia, unspecified: Secondary | ICD-10-CM | POA: Diagnosis not present

## 2020-12-08 DIAGNOSIS — Z7984 Long term (current) use of oral hypoglycemic drugs: Secondary | ICD-10-CM | POA: Diagnosis not present

## 2020-12-08 LAB — COMPREHENSIVE METABOLIC PANEL
ALT: 20 U/L (ref 0–44)
AST: 24 U/L (ref 15–41)
Albumin: 3.9 g/dL (ref 3.5–5.0)
Alkaline Phosphatase: 75 U/L (ref 38–126)
Anion gap: 11 (ref 5–15)
BUN: 30 mg/dL — ABNORMAL HIGH (ref 8–23)
CO2: 25 mmol/L (ref 22–32)
Calcium: 10.3 mg/dL (ref 8.9–10.3)
Chloride: 103 mmol/L (ref 98–111)
Creatinine, Ser: 1.34 mg/dL — ABNORMAL HIGH (ref 0.44–1.00)
GFR, Estimated: 45 mL/min — ABNORMAL LOW (ref >60)
Glucose, Bld: 179 mg/dL — ABNORMAL HIGH (ref 70–99)
Potassium: 4.7 mmol/L (ref 3.5–5.1)
Sodium: 139 mmol/L (ref 135–145)
Total Bilirubin: 0.5 mg/dL (ref 0.3–1.2)
Total Protein: 7.3 g/dL (ref 6.5–8.1)

## 2020-12-08 LAB — CBC WITH DIFFERENTIAL/PLATELET
Abs Immature Granulocytes: 0.02 10*3/uL (ref 0.00–0.07)
Basophils Absolute: 0.1 10*3/uL (ref 0.0–0.1)
Basophils Relative: 1 %
Eosinophils Absolute: 0.4 10*3/uL (ref 0.0–0.5)
Eosinophils Relative: 6 %
HCT: 40.2 % (ref 36.0–46.0)
Hemoglobin: 12.4 g/dL (ref 12.0–15.0)
Immature Granulocytes: 0 %
Lymphocytes Relative: 25 %
Lymphs Abs: 1.6 10*3/uL (ref 0.7–4.0)
MCH: 27.1 pg (ref 26.0–34.0)
MCHC: 30.8 g/dL (ref 30.0–36.0)
MCV: 88 fL (ref 80.0–100.0)
Monocytes Absolute: 0.5 10*3/uL (ref 0.1–1.0)
Monocytes Relative: 8 %
Neutro Abs: 3.8 10*3/uL (ref 1.7–7.7)
Neutrophils Relative %: 60 %
Platelets: 101 10*3/uL — ABNORMAL LOW (ref 150–400)
RBC: 4.57 MIL/uL (ref 3.87–5.11)
RDW: 13.7 % (ref 11.5–15.5)
WBC: 6.4 10*3/uL (ref 4.0–10.5)
nRBC: 0 % (ref 0.0–0.2)

## 2020-12-08 LAB — VITAMIN B12: Vitamin B-12: 971 pg/mL — ABNORMAL HIGH (ref 180–914)

## 2020-12-08 LAB — VITAMIN D 25 HYDROXY (VIT D DEFICIENCY, FRACTURES): Vit D, 25-Hydroxy: 32.25 ng/mL (ref 30–100)

## 2020-12-08 LAB — LACTATE DEHYDROGENASE: LDH: 156 U/L (ref 98–192)

## 2020-12-09 ENCOUNTER — Ambulatory Visit: Payer: Medicare Other | Admitting: Gastroenterology

## 2020-12-09 DIAGNOSIS — E782 Mixed hyperlipidemia: Secondary | ICD-10-CM | POA: Diagnosis not present

## 2020-12-09 DIAGNOSIS — E118 Type 2 diabetes mellitus with unspecified complications: Secondary | ICD-10-CM | POA: Diagnosis not present

## 2020-12-09 DIAGNOSIS — I10 Essential (primary) hypertension: Secondary | ICD-10-CM | POA: Diagnosis not present

## 2020-12-13 DIAGNOSIS — I10 Essential (primary) hypertension: Secondary | ICD-10-CM | POA: Diagnosis not present

## 2020-12-15 ENCOUNTER — Ambulatory Visit (HOSPITAL_COMMUNITY): Payer: Medicare Other | Admitting: Hematology

## 2020-12-27 DIAGNOSIS — J301 Allergic rhinitis due to pollen: Secondary | ICD-10-CM | POA: Diagnosis not present

## 2020-12-29 ENCOUNTER — Ambulatory Visit (HOSPITAL_COMMUNITY): Payer: Medicare Other | Admitting: Hematology

## 2020-12-29 ENCOUNTER — Ambulatory Visit (HOSPITAL_COMMUNITY): Payer: Medicare Other | Admitting: Physician Assistant

## 2020-12-29 NOTE — Progress Notes (Deleted)
[address] [place], [place] [address]   CLINIC:  Medical Oncology/Hematology  PCP:  Caren Macadam, [place] [address] [phone]   REASON FOR VISIT:  Follow-up for ***  CURRENT THERAPY: Observation  INTERVAL HISTORY:  Barbara House 65 y.o. female returns for routine follow-up of thrombocytopenia and normocytic anemia.  She was last seen on 09/02/2020 by NP Faythe Casa.  ***  Patient reports she has been doing well since her last visit. She denies any B symptoms.  Denies any nausea, vomiting, or diarrhea. Denies any new pains. No new neurologic symptoms such as new-onset hearing loss, blurred vision, headache, or dizziness.  No thromboembolic events since her last visit. Had not noticed any recent bleeding such as epistaxis, hematuria or hematochezia. Denies recent chest pain on exertion, shortness of breath on minimal exertion, pre-syncopal episodes, or palpitations. Denies any numbness or tingling in hands or feet. Denies any recent fevers, infections, or recent hospitalizations. No new masses or lymphadenopathy per her report. Patient reports appetite at *** and energy level at ***. She  is maintaining stable weight at this time.  She has ***% energy and ***% appetite. She endorses that she is maintaining a stable weight.    REVIEW OF SYSTEMS:  Review of Systems - Oncology    PAST MEDICAL/SURGICAL HISTORY:  Past Medical History:  Diagnosis Date  . Allergic rhinitis   . Anxiety disorder   . Arthritis   . GERD (gastroesophageal reflux disease)   . Headache   . History of migraine   . Hypertension   . Thrombocytopenia ([place])   . Type 2 diabetes mellitus ([place])    Not sure when she was diagnosed, greater than five years.    Past Surgical History:  Procedure Laterality Date  . ABDOMINAL HYSTERECTOMY     has ovaries.   [name] CARDIAC CATHETERIZATION N/A 08/03/2016   Procedure: Left Heart Cath and Coronary Angiography;   Surgeon: Peter M Martinique, MD;  Location: [facility];  Service: Cardiovascular;  Laterality: N/A;  . COLONOSCOPY WITH PROPOFOL N/A 11/05/2018   Dr. Oneida Alar: int/ext hemorrhoids, 64mm cecal tubular adenoma removed, next TCS in 5-10 years  . ESOPHAGOGASTRODUODENOSCOPY (EGD) WITH PROPOFOL N/A 11/05/2018   Dr. Oneida Alar: esophagus was dilated, mild gastritis with benign bx  . Miinor skin surgery    . POLYPECTOMY  11/05/2018   Procedure: POLYPECTOMY;  Surgeon: Danie Binder, MD;  [facility];  Service: Endoscopy;;  cecal polyp  . SAVORY DILATION N/A 11/05/2018   Procedure: SAVORY DILATION;  Surgeon: Danie Binder, MD;  [facility];  Service: Endoscopy;  Laterality: N/A;     SOCIAL HISTORY:  Social History   Socioeconomic History  . Marital status: Single    Spouse name: Not on file  . Number of children: 1  . Years of education: Not on file  . Highest education level: Not on file  Occupational History  . Not on file  Tobacco Use  . Smoking status: Never Smoker  . Smokeless tobacco: Never Used  Vaping Use  . Vaping Use: Never used  Substance and Sexual Activity  . Alcohol use: No    Alcohol/week: 0.0 standard drinks  . Drug use: No  . Sexual activity: Not Currently    Birth control/protection: Surgical  Other Topics Concern  . Not on file  Social History Narrative   Reports is on disability. Has one son. Lives in [place]. Enjoys time  with family. Enjoys Firefighter. Attends church.    Social Determinants of Health   Financial Resource Strain: Not on file  Food Insecurity: Not on file  Transportation Needs: Not on file  Physical Activity: Not on file  Stress: Not on file  Social Connections: Not on file  Intimate Partner Violence: Not on file    FAMILY HISTORY:  Family History  Problem Relation Age of Onset  . Hypertension Mother   . Diabetes Mother   . Hypertension Son   . Diabetes Son   . Cancer Father   . Cirrhosis Brother   . Alcohol  abuse Brother   . Colon cancer Neg Hx     CURRENT MEDICATIONS:  Outpatient Encounter Medications as of 12/29/2020  Medication Sig  . atorvastatin (LIPITOR) 20 MG tablet Take 10 mg by mouth daily.  . cetirizine (ZYRTEC) 10 MG tablet Take 10 mg by mouth daily.  . chlorthalidone (HYGROTON) 25 MG tablet Take 25 mg by mouth every morning.  . Cyanocobalamin (VITAMIN B-12 PO) Take 1 tablet by mouth daily.  . cycloSPORINE (RESTASIS) 0.05 % ophthalmic emulsion Place 1 drop into both eyes 2 (two) times daily as needed (for eye dryness.).   [name] dexlansoprazole (DEXILANT) 60 MG capsule 1 PO EVERY MORNING WITH BREAKFAST.  [name] diclofenac Sodium (VOLTAREN) 1 % GEL Apply 2 g topically 4 (four) times daily. Apply to medial left elbow  . FARXIGA 10 MG TABS tablet Take 10 mg by mouth daily.  . Fluocinolone Acetonide 0.01 % OIL Place in ear(s).  . GE100 BLOOD GLUCOSE TEST test strip CHECK BLOOD SUGAR ONCE DAILY OR AS DIRECTED BY PHYSICIAN  . glipiZIDE (GLUCOTROL XL) 10 MG 24 hr tablet Take 10 mg by mouth every morning.  [name] ipratropium (ATROVENT) 0.03 % nasal spray SMARTSIG:2 Spray(s) Both Nares 3 Times Daily PRN  . losartan (COZAAR) 100 MG tablet Take 100 mg by mouth daily.  . montelukast (SINGULAIR) 10 MG tablet TAKE 1 TABLET (10 MG TOTAL) BY MOUTH AT BEDTIME AS NEEDED (FOR ALLERGIES.). (Patient taking differently: Take 10 mg by mouth daily.)  . nystatin cream (MYCOSTATIN) Apply topically 2 (two) times daily as needed.  [name] PARoxetine (PAXIL) 40 MG tablet TAKE ONE TABLET BY MOUTH EVERY MORNING. (Patient taking differently: Take 40 mg by mouth daily.)  . sitaGLIPtin-metformin (JANUMET) 50-1000 MG tablet Take 1 tablet by mouth 2 (two) times daily with a meal.  . verapamil (CALAN-SR) 240 MG CR tablet TAKE ONE TABLET BY MOUTH EVERY EVENING. (Patient taking differently: Take 240 mg by mouth daily after breakfast.)   No facility-administered encounter medications on file as of 12/29/2020.    ALLERGIES:  Allergies   Allergen Reactions  . Prednisone Other (See Comments)    Hair loss     PHYSICAL EXAM:  ECOG PERFORMANCE STATUS: {CHL ONC ECOG [phone]}  There were no vitals filed for this visit. There were no vitals filed for this visit. Physical Exam   LABORATORY DATA:  I have reviewed the labs as listed.  CBC    Component Value Date/Time   WBC 6.4 12/08/2020 1031   RBC 4.57 12/08/2020 1031   HGB 12.4 12/08/2020 1031   HGB 11.7 06/25/2018 1548   HCT 40.2 12/08/2020 1031   HCT 34.7 06/25/2018 1548   PLT 101 (L) 12/08/2020 1031   PLT 165 06/25/2018 1548   MCV 88.0 12/08/2020 1031   MCV 85 06/25/2018 1548   MCH 27.1 12/08/2020 1031   MCHC 30.8 12/08/2020 1031   RDW 13.7  12/08/2020 1031   RDW 13.5 06/25/2018 1548   LYMPHSABS 1.6 12/08/2020 1031   LYMPHSABS 2.4 06/25/2018 1548   MONOABS 0.5 12/08/2020 1031   EOSABS 0.4 12/08/2020 1031   EOSABS 0.3 06/25/2018 1548   BASOSABS 0.1 12/08/2020 1031   BASOSABS 0.0 06/25/2018 1548   CMP Latest Ref Rng & Units 12/08/2020 08/27/2020 06/01/2020  Glucose 70 - 99 mg/dL 179(H) 133(H) 170(H)  BUN 8 - 23 mg/dL 30(H) 36(H) 31(H)  Creatinine 0.44 - 1.00 mg/dL 1.34(H) 1.71(H) 1.66(H)  Sodium 135 - 145 mmol/L 139 138 139  Potassium 3.5 - 5.1 mmol/L 4.7 4.2 4.4  Chloride 98 - 111 mmol/L 103 104 102  CO2 22 - 32 mmol/L 25 21(L) 24  Calcium 8.9 - 10.3 mg/dL 10.3 10.2 10.2  Total Protein 6.5 - 8.1 g/dL 7.3 7.8 8.1  Total Bilirubin 0.3 - 1.2 mg/dL 0.5 0.4 0.3  Alkaline Phos 38 - 126 U/L 75 84 86  AST 15 - 41 U/L 24 33 25  ALT 0 - 44 U/L 20 22 23     DIAGNOSTIC IMAGING:  I have independently reviewed the relevant imaging and discussed with the patient.  ASSESSMENT: 1.  Thrombocytopenia, mild to moderate -Initial work-up unable to determine cause of thrombocytopenia -SPEP was normal, hepatitis panel was normal, RF and ANA were negative, no overt nutritional deficiencies (normal B12, folate, methylmalonic acid, copper)  -Pathologist smear review  on 08/29/2019 showed mild thrombocytopenia borderline anemia -Normal abdominal ultrasound from 08/15/2017 negative for liver abnormality, spleen was normal in size and appearance -Reported having one blood transfusion many years ago, but unsure of reason -Patient is on vitamin B12 injections, also taking vitamin B12 pills -Platelets have ranged from 80 to 109 since January 2020 -No major bleeding events, no B symptoms, no easy bruising -Differential diagnosis includes immune thrombocytopenia versus very early MDS -CBC reviewed from 12/08/2020 with platelets 101, slightly improved from previous  2.  Normocytic anemia -Patient has had prior history of mild normocytic anemia with hemoglobin ranging from 11-12 -Most recent measures over the past 6 months have showed normal hemoglobin > 12.0 -Vitamin B12 most recently 971, has been receiving monthly B12 injections *** -CBC reviewed from 12/08/2020 with hemoglobin 12.4, MCV 88.0  3.  Vitamin D deficiency -Hypovitaminosis D evidenced in August 2021 with vitamin D level 23.76 -Has been taking vitamin D supplement, with improvement in vitamin D levels -Vitamin D level checked on 12/08/2020 within normal limits at 32.25   PLAN:  1.  Thrombocytopenia -CBC reviewed from 12/08/2020 with platelets 101, slightly improved from previous -Denies bleeding events -No need for intervention at this time -We will continue to monitor, RTC in 4 months for repeat labs and follow-up appointment  2.  Normocytic anemia -Normocytic anemia of unknown cause appears to have resolved -No need for intervention at this time, will continue to monitor  3.  Vitamin D deficiency -Continue taking oral vitamin D supplementation   PLAN SUMMARY & DISPOSITION: ***  All questions were answered. The patient knows to call the clinic with any problems, questions or concerns.  Medical decision making: ***  Time spent on visit: I spent {CHL ONC TIME VISIT - JGGEZ:[id number]}  counseling the patient face to face. The total time spent in the appointment was {CHL ONC TIME VISIT - TKPTW:[id number]} and more than 50% was on counseling.   Harriett Rush, PA-C  12/29/20 9:19 AM

## 2021-01-03 ENCOUNTER — Ambulatory Visit (HOSPITAL_COMMUNITY): Payer: Medicare Other | Admitting: Physician Assistant

## 2021-01-06 NOTE — H&P (View-Only) (Signed)
Referring Provider: Caren Macadam, MD Primary Care Physician:  Caren Macadam, MD Primary GI Physician: Dr. Abbey Chatters  Chief Complaint  Patient presents with  . Gastroesophageal Reflux    sometimes  . Abdominal Pain    Soreness mid abd and left side    HPI:   Barbara House is a 65 y.o. female with a history of chronic GERD, dysphagia s/p dilation in 2020, adenomatous colon polyp due for colonoscopy between 2025-2030. Followed by Hematology for thrombocytopenia. BPE in Dec 2020 normal.GES completed May 2021 and noted to have significantly delayed gastric emptying in setting of diabetes.  She is presenting today for follow-up.  Last seen in our office 04/27/2020 for GERD and LUQ pain.  Reported occasional abdominal pain prior to BMs then relieved.  No association with meals.  No rectal bleeding.  No nausea or vomiting.  Good appetite.  GERD controlled on Dexilant.  Just before end of visit, she mentioned chronic left-sided soreness that was constant.  No association with meals.  Exam with TTP in the LUQ.  Plan to continue Dexilant, CT A/P, follow-up in 6-8 months.  Patient called to cancel CT.   Today:  Has a lot of sinus trouble. Coughs up phlegm, but feels like something is in her throat all the time. This started before Christmas.  Saw an asthma and sinus provider and was told globus sensation may be secondary to GERD.  GERD is fairly well controlled on Dexilant 60 mg daily.  Breakthrough symptoms less than once a week if eating something spicy.  She has noted return of dysphagia symptoms over the last year.  Reports prior dilation was helpful.  Symptoms occur with solid foods.  She will have to drink a lot of water or other liquid to push food down.  States it feels hard as it is going down.  No trouble with soft foods.  Last Sunday, she had a nagging pain in her lower abdomen. This occurs every now and then. Feels like she will need to pee or have a BM.  Had a couple of loose stools  thereafter with improvement in symptoms.  No watery stools.  States this an be triggered by eating a lot of green foods.  BMs are daily.  No routine constipation or diarrhea.  No blood in the stool. No black stool.   Upper abdominal pain every now and then if she has a lot of gas which is triggered by dairy products or onions.  Otherwise, no significant upper abdominal pain or gas.  Left side stays sore like it is bruised. Notices when she is putting on her clothes or bumps the area. No worsening with eating or bowel movements. No associated nausea or vomiting.  Symptoms have been going on for at least a year.  Reports having x-ray with PCP that was normal.  Reports urinary frequency but no dysuria. No fever or chills.   Past Medical History:  Diagnosis Date  . Allergic rhinitis   . Anxiety disorder   . Arthritis   . GERD (gastroesophageal reflux disease)   . Headache   . History of migraine   . Hypertension   . Thrombocytopenia ([place])   . Type 2 diabetes mellitus ([place])    Not sure when she was diagnosed, greater than five years.     Past Surgical History:  Procedure Laterality Date  . ABDOMINAL HYSTERECTOMY     has ovaries.   [name] CARDIAC CATHETERIZATION N/A 08/03/2016   Procedure: Left Heart  Cath and Coronary Angiography;  Surgeon: Peter M Martinique, MD;  Location: [facility];  Service: Cardiovascular;  Laterality: N/A;  . COLONOSCOPY WITH PROPOFOL N/A 11/05/2018   Dr. Oneida Alar: int/ext hemorrhoids, 42mm cecal tubular adenoma removed, next TCS in 5-10 years  . ESOPHAGOGASTRODUODENOSCOPY (EGD) WITH PROPOFOL N/A 11/05/2018   Dr. Oneida Alar: esophagus was dilated, mild gastritis with benign bx  . Miinor skin surgery    . POLYPECTOMY  11/05/2018   Procedure: POLYPECTOMY;  Surgeon: Danie Binder, MD;  [facility];  Service: Endoscopy;;  cecal polyp  . SAVORY DILATION N/A 11/05/2018   Procedure: SAVORY DILATION;  Surgeon: Danie Binder, MD;  [facility];  Service:  Endoscopy;  Laterality: N/A;    Current Outpatient Medications  Medication Sig Dispense Refill  . atorvastatin (LIPITOR) 20 MG tablet Take 10 mg by mouth daily.    . cetirizine (ZYRTEC) 10 MG tablet Take 10 mg by mouth daily.    . chlorthalidone (HYGROTON) 25 MG tablet Take 25 mg by mouth every morning.    . Cyanocobalamin (VITAMIN B-12 PO) Take 1 tablet by mouth daily.    . cycloSPORINE (RESTASIS) 0.05 % ophthalmic emulsion Place 1 drop into both eyes 2 (two) times daily as needed (for eye dryness.).     [name] dexlansoprazole (DEXILANT) 60 MG capsule 1 PO EVERY MORNING WITH BREAKFAST. 90 capsule 3  . diclofenac Sodium (VOLTAREN) 1 % GEL Apply 2 g topically 4 (four) times daily. Apply to medial left elbow (Patient taking differently: Apply 2 g topically as needed. Apply to medial left elbow) 150 g 1  . FARXIGA 10 MG TABS tablet Take 10 mg by mouth daily.    . GE100 BLOOD GLUCOSE TEST test strip CHECK BLOOD SUGAR ONCE DAILY OR AS DIRECTED BY PHYSICIAN    . glipiZIDE (GLUCOTROL XL) 10 MG 24 hr tablet Take 10 mg by mouth every morning.    [name] ipratropium (ATROVENT) 0.03 % nasal spray SMARTSIG:2 Spray(s) Both Nares 3 Times Daily PRN    . losartan (COZAAR) 100 MG tablet Take 100 mg by mouth daily.    . montelukast (SINGULAIR) 10 MG tablet TAKE 1 TABLET (10 MG TOTAL) BY MOUTH AT BEDTIME AS NEEDED (FOR ALLERGIES.). (Patient taking differently: Take 10 mg by mouth daily.) 90 tablet 1  . nystatin cream (MYCOSTATIN) Apply topically 2 (two) times daily as needed.    [name] PARoxetine (PAXIL) 40 MG tablet TAKE ONE TABLET BY MOUTH EVERY MORNING. (Patient taking differently: Take 40 mg by mouth daily.) 90 tablet 0  . sitaGLIPtin-metformin (JANUMET) 50-1000 MG tablet Take 1 tablet by mouth 2 (two) times daily with a meal.    . verapamil (CALAN-SR) 240 MG CR tablet TAKE ONE TABLET BY MOUTH EVERY EVENING. (Patient taking differently: Take 240 mg by mouth daily after breakfast.) 90 tablet 0  . Fluocinolone Acetonide 0.01 %  OIL Place in ear(s). (Patient not taking: Reported on 01/07/2021)     No current facility-administered medications for this visit.    Allergies as of 01/07/2021 - Review Complete 01/07/2021  Allergen Reaction Noted  . Prednisone Other (See Comments) 09/15/2016    Family History  Problem Relation Age of Onset  . Hypertension Mother   . Diabetes Mother   . Hypertension Son   . Diabetes Son   . Cancer Father   . Cirrhosis Brother   . Alcohol abuse Brother   . Colon cancer Neg Hx     Social History  Socioeconomic History  . Marital status: Single    Spouse name: Not on file  . Number of children: 1  . Years of education: Not on file  . Highest education level: Not on file  Occupational History  . Not on file  Tobacco Use  . Smoking status: Never Smoker  . Smokeless tobacco: Never Used  Vaping Use  . Vaping Use: Never used  Substance and Sexual Activity  . Alcohol use: No    Alcohol/week: 0.0 standard drinks  . Drug use: No  . Sexual activity: Not Currently    Birth control/protection: Surgical  Other Topics Concern  . Not on file  Social History Narrative   Reports is on disability. Has one son. Lives in [place]. Enjoys time with family. Enjoys Firefighter. Attends church.    Social Determinants of Health   Financial Resource Strain: Not on file  Food Insecurity: Not on file  Transportation Needs: Not on file  Physical Activity: Not on file  Stress: Not on file  Social Connections: Not on file    Review of Systems: Gen: Denies lightheadedness, dizziness, presyncope, syncope. CV: Denies chest pain or palpitations. Resp: Denies dyspnea or cough. GI: See HPI GU: See HPI Heme: See HPI  Physical Exam: BP 130/78   Pulse 85   Temp (!) 97.3 F (36.3 C) (Temporal)   Ht 5\' 1"  (1.549 m)   Wt 201 lb 12.8 oz (91.5 kg)   BMI 38.13 kg/m  General:   Alert and oriented. No distress noted. Pleasant and cooperative.  Head:  Normocephalic and atraumatic. Eyes:   Conjuctiva clear without scleral icterus. Heart:  S1, S2 present without murmurs appreciated. Lungs:  Clear to auscultation bilaterally. No wheezes, rales, or rhonchi. No distress.  Abdomen:  +BS, soft, and non-distended. Very mild TTP in LUQ. Increased TTP in the left flank and along left mid and low back. + left CVA tenderness. No rebound or guarding. No HSM or masses noted. Msk:  Symmetrical without gross deformities. Normal posture. Extremities:  Without edema. Neurologic:  Alert and  oriented x4 Psych: Normal mood and affect.

## 2021-01-06 NOTE — Progress Notes (Signed)
Referring Provider: Caren Macadam, MD Primary Care Physician:  Caren Macadam, MD Primary GI Physician: Dr. Abbey Chatters  Chief Complaint  Patient presents with  . Gastroesophageal Reflux    sometimes  . Abdominal Pain    Soreness mid abd and left side    HPI:   Barbara House is a 65 y.o. female with a history of chronic GERD, dysphagia s/p dilation in 2020, adenomatous colon polyp due for colonoscopy between 2025-2030. Followed by Hematology for thrombocytopenia. BPE in Dec 2020 normal.GES completed May 2021 and noted to have significantly delayed gastric emptying in setting of diabetes.  She is presenting today for follow-up.  Last seen in our office 04/27/2020 for GERD and LUQ pain.  Reported occasional abdominal pain prior to BMs then relieved.  No association with meals.  No rectal bleeding.  No nausea or vomiting.  Good appetite.  GERD controlled on Dexilant.  Just before end of visit, she mentioned chronic left-sided soreness that was constant.  No association with meals.  Exam with TTP in the LUQ.  Plan to continue Dexilant, CT A/P, follow-up in 6-8 months.  Patient called to cancel CT.   Today:  Has a lot of sinus trouble. Coughs up phlegm, but feels like something is in her throat all the time. This started before Christmas.  Saw an asthma and sinus provider and was told globus sensation may be secondary to GERD.  GERD is fairly well controlled on Dexilant 60 mg daily.  Breakthrough symptoms less than once a week if eating something spicy.  She has noted return of dysphagia symptoms over the last year.  Reports prior dilation was helpful.  Symptoms occur with solid foods.  She will have to drink a lot of water or other liquid to push food down.  States it feels hard as it is going down.  No trouble with soft foods.  Last Sunday, she had a nagging pain in her lower abdomen. This occurs every now and then. Feels like she will need to pee or have a BM.  Had a couple of loose stools  thereafter with improvement in symptoms.  No watery stools.  States this an be triggered by eating a lot of green foods.  BMs are daily.  No routine constipation or diarrhea.  No blood in the stool. No black stool.   Upper abdominal pain every now and then if she has a lot of gas which is triggered by dairy products or onions.  Otherwise, no significant upper abdominal pain or gas.  Left side stays sore like it is bruised. Notices when she is putting on her clothes or bumps the area. No worsening with eating or bowel movements. No associated nausea or vomiting.  Symptoms have been going on for at least a year.  Reports having x-ray with PCP that was normal.  Reports urinary frequency but no dysuria. No fever or chills.   Past Medical History:  Diagnosis Date  . Allergic rhinitis   . Anxiety disorder   . Arthritis   . GERD (gastroesophageal reflux disease)   . Headache   . History of migraine   . Hypertension   . Thrombocytopenia ([place])   . Type 2 diabetes mellitus ([place])    Not sure when she was diagnosed, greater than five years.     Past Surgical History:  Procedure Laterality Date  . ABDOMINAL HYSTERECTOMY     has ovaries.   [name] CARDIAC CATHETERIZATION N/A 08/03/2016   Procedure: Left Heart  Cath and Coronary Angiography;  Surgeon: Peter M Martinique, MD;  Location: [facility];  Service: Cardiovascular;  Laterality: N/A;  . COLONOSCOPY WITH PROPOFOL N/A 11/05/2018   Dr. Oneida Alar: int/ext hemorrhoids, 60mm cecal tubular adenoma removed, next TCS in 5-10 years  . ESOPHAGOGASTRODUODENOSCOPY (EGD) WITH PROPOFOL N/A 11/05/2018   Dr. Oneida Alar: esophagus was dilated, mild gastritis with benign bx  . Miinor skin surgery    . POLYPECTOMY  11/05/2018   Procedure: POLYPECTOMY;  Surgeon: Danie Binder, MD;  [facility];  Service: Endoscopy;;  cecal polyp  . SAVORY DILATION N/A 11/05/2018   Procedure: SAVORY DILATION;  Surgeon: Danie Binder, MD;  [facility];  Service:  Endoscopy;  Laterality: N/A;    Current Outpatient Medications  Medication Sig Dispense Refill  . atorvastatin (LIPITOR) 20 MG tablet Take 10 mg by mouth daily.    . cetirizine (ZYRTEC) 10 MG tablet Take 10 mg by mouth daily.    . chlorthalidone (HYGROTON) 25 MG tablet Take 25 mg by mouth every morning.    . Cyanocobalamin (VITAMIN B-12 PO) Take 1 tablet by mouth daily.    . cycloSPORINE (RESTASIS) 0.05 % ophthalmic emulsion Place 1 drop into both eyes 2 (two) times daily as needed (for eye dryness.).     [name] dexlansoprazole (DEXILANT) 60 MG capsule 1 PO EVERY MORNING WITH BREAKFAST. 90 capsule 3  . diclofenac Sodium (VOLTAREN) 1 % GEL Apply 2 g topically 4 (four) times daily. Apply to medial left elbow (Patient taking differently: Apply 2 g topically as needed. Apply to medial left elbow) 150 g 1  . FARXIGA 10 MG TABS tablet Take 10 mg by mouth daily.    . GE100 BLOOD GLUCOSE TEST test strip CHECK BLOOD SUGAR ONCE DAILY OR AS DIRECTED BY PHYSICIAN    . glipiZIDE (GLUCOTROL XL) 10 MG 24 hr tablet Take 10 mg by mouth every morning.    [name] ipratropium (ATROVENT) 0.03 % nasal spray SMARTSIG:2 Spray(s) Both Nares 3 Times Daily PRN    . losartan (COZAAR) 100 MG tablet Take 100 mg by mouth daily.    . montelukast (SINGULAIR) 10 MG tablet TAKE 1 TABLET (10 MG TOTAL) BY MOUTH AT BEDTIME AS NEEDED (FOR ALLERGIES.). (Patient taking differently: Take 10 mg by mouth daily.) 90 tablet 1  . nystatin cream (MYCOSTATIN) Apply topically 2 (two) times daily as needed.    [name] PARoxetine (PAXIL) 40 MG tablet TAKE ONE TABLET BY MOUTH EVERY MORNING. (Patient taking differently: Take 40 mg by mouth daily.) 90 tablet 0  . sitaGLIPtin-metformin (JANUMET) 50-1000 MG tablet Take 1 tablet by mouth 2 (two) times daily with a meal.    . verapamil (CALAN-SR) 240 MG CR tablet TAKE ONE TABLET BY MOUTH EVERY EVENING. (Patient taking differently: Take 240 mg by mouth daily after breakfast.) 90 tablet 0  . Fluocinolone Acetonide 0.01 %  OIL Place in ear(s). (Patient not taking: Reported on 01/07/2021)     No current facility-administered medications for this visit.    Allergies as of 01/07/2021 - Review Complete 01/07/2021  Allergen Reaction Noted  . Prednisone Other (See Comments) 09/15/2016    Family History  Problem Relation Age of Onset  . Hypertension Mother   . Diabetes Mother   . Hypertension Son   . Diabetes Son   . Cancer Father   . Cirrhosis Brother   . Alcohol abuse Brother   . Colon cancer Neg Hx     Social History  Socioeconomic History  . Marital status: Single    Spouse name: Not on file  . Number of children: 1  . Years of education: Not on file  . Highest education level: Not on file  Occupational History  . Not on file  Tobacco Use  . Smoking status: Never Smoker  . Smokeless tobacco: Never Used  Vaping Use  . Vaping Use: Never used  Substance and Sexual Activity  . Alcohol use: No    Alcohol/week: 0.0 standard drinks  . Drug use: No  . Sexual activity: Not Currently    Birth control/protection: Surgical  Other Topics Concern  . Not on file  Social History Narrative   Reports is on disability. Has one son. Lives in [place]. Enjoys time with family. Enjoys Firefighter. Attends church.    Social Determinants of Health   Financial Resource Strain: Not on file  Food Insecurity: Not on file  Transportation Needs: Not on file  Physical Activity: Not on file  Stress: Not on file  Social Connections: Not on file    Review of Systems: Gen: Denies lightheadedness, dizziness, presyncope, syncope. CV: Denies chest pain or palpitations. Resp: Denies dyspnea or cough. GI: See HPI GU: See HPI Heme: See HPI  Physical Exam: BP 130/78   Pulse 85   Temp (!) 97.3 F (36.3 C) (Temporal)   Ht 5\' 1"  (1.549 m)   Wt 201 lb 12.8 oz (91.5 kg)   BMI 38.13 kg/m  General:   Alert and oriented. No distress noted. Pleasant and cooperative.  Head:  Normocephalic and atraumatic. Eyes:   Conjuctiva clear without scleral icterus. Heart:  S1, S2 present without murmurs appreciated. Lungs:  Clear to auscultation bilaterally. No wheezes, rales, or rhonchi. No distress.  Abdomen:  +BS, soft, and non-distended. Very mild TTP in LUQ. Increased TTP in the left flank and along left mid and low back. + left CVA tenderness. No rebound or guarding. No HSM or masses noted. Msk:  Symmetrical without gross deformities. Normal posture. Extremities:  Without edema. Neurologic:  Alert and  oriented x4 Psych: Normal mood and affect.

## 2021-01-07 ENCOUNTER — Other Ambulatory Visit: Payer: Self-pay

## 2021-01-07 ENCOUNTER — Ambulatory Visit (INDEPENDENT_AMBULATORY_CARE_PROVIDER_SITE_OTHER): Payer: Medicare Other | Admitting: Gastroenterology

## 2021-01-07 ENCOUNTER — Encounter: Payer: Self-pay | Admitting: Gastroenterology

## 2021-01-07 VITALS — BP 130/78 | HR 85 | Temp 97.3°F | Ht 61.0 in | Wt 201.8 lb

## 2021-01-07 DIAGNOSIS — R109 Unspecified abdominal pain: Secondary | ICD-10-CM

## 2021-01-07 DIAGNOSIS — M549 Dorsalgia, unspecified: Secondary | ICD-10-CM | POA: Diagnosis not present

## 2021-01-07 DIAGNOSIS — R1012 Left upper quadrant pain: Secondary | ICD-10-CM

## 2021-01-07 DIAGNOSIS — K219 Gastro-esophageal reflux disease without esophagitis: Secondary | ICD-10-CM

## 2021-01-07 DIAGNOSIS — R350 Frequency of micturition: Secondary | ICD-10-CM

## 2021-01-07 DIAGNOSIS — R1319 Other dysphagia: Secondary | ICD-10-CM

## 2021-01-07 DIAGNOSIS — G8929 Other chronic pain: Secondary | ICD-10-CM

## 2021-01-07 DIAGNOSIS — R140 Abdominal distension (gaseous): Secondary | ICD-10-CM | POA: Diagnosis not present

## 2021-01-07 NOTE — Assessment & Plan Note (Signed)
Patient reports occasional abdominal discomfort if having increased gas.  Triggered by dairy products, onions, and were eating a lot of green foods.  Occasionally, she will have looser stools.  No alarm symptoms.  Suspect we are dealing with dietary intolerances.  She was advised to follow a lactose-free diet or take Lactaid tablets prior to dairy consumption. Also counseled to avoid common gas producing items.  Written instructions and several handout were provided.

## 2021-01-07 NOTE — Assessment & Plan Note (Signed)
Addressed under LUQ pain.

## 2021-01-07 NOTE — Assessment & Plan Note (Signed)
History of dysphagia noting improvement with prior dilation in 2020 though esophagus appeared normal prior to dilation.  Over the last year, she has had return of solid food dysphagia with foods getting stuck at the sternal notch.  She requires liquids to push items down.  No trouble with soft foods. Also a globus sensation.   Chronic history of GERD that seems fairly well controlled on Dexilant 60 mg daily.   Query whether patient may have cervical esophageal web, ring, or stricture.  Less likely EOE or malignancy.  Plan: 1.  Proceed with EGD +/- dilation with propofol with Dr. Abbey Chatters in the near future. The risks, benefits, and alternatives have been discussed with the patient in detail. The patient states understanding and desires to proceed.  ASA II No a.m. diabetes medications day of procedure. 2.  Continue Dexilant daily. 3.  Follow-up after EGD.

## 2021-01-07 NOTE — Assessment & Plan Note (Signed)
Fairly well controlled on Dexilant 60 mg daily.  Breakthrough symptoms less than once a week associated with spicy foods.  She does report return of solid food dysphagia as discussed above which was previously improved with dilation in 2020.   Plan: 1.  Continue Dexilant 60 mg daily. 2.  Counseled on GERD diet/lifestyle.  Written instructions provided. 3.  Proceed with EGD +/-dilation with propofol with Dr. Abbey Chatters in the near future. The risks, benefits, and alternatives have been discussed with the patient in detail. The patient states understanding and desires to proceed.  4.  Follow-up after EGD.

## 2021-01-07 NOTE — Patient Instructions (Addendum)
Please go to [facility] (lab across the street from [facility] emergency department) to have urine checked and kidney function updated.  We will arrange for you to have a CT scan of your abdomen and pelvis to further evaluate your left-sided pain. Hold Janumet the day of your CT scan and the day after your CT scan.  Keep a close check on your blood sugars during this time.   We will arrange for you to have an upper endoscopy with stretching of your esophagus in the near future with Dr. Abbey Chatters.  Continue taking Dexilant 60 mg daily.  Follow a GERD diet:  Avoid fried, fatty, greasy, spicy, citrus foods. Avoid caffeine and carbonated beverages. Avoid chocolate. Try eating 4-6 small meals a day rather than 3 large meals. Do not eat within 3 hours of laying down. Prop head of bed up on wood or bricks to create a 6 inch incline.  Follow a lactose-free diet or take Lactaid tablets prior to dairy consumption.  Limit common gas producing items including cabbage, green leafy vegetables, Brussels sprouts, broccoli, cauliflower, beans, artificial sweeteners, drinking through a straw, chewing gum, and hard candy.  We will plan to see you back after your upper endoscopy.  Do not hesitate to call if you have questions or concerns prior.  It was good to meet you today!  Barbara Altes, PA-C [facility]     Food Choices for Gastroesophageal Reflux Disease, Adult When you have gastroesophageal reflux disease (GERD), the foods you eat and your eating habits are very important. Choosing the right foods can help ease the discomfort of GERD. Consider working with a dietitian to help you make healthy food choices. What are tips for following this plan? Reading food labels  Look for foods that are low in saturated fat. Foods that have less than 5% of daily value (DV) of fat and 0 g of trans fats may help with your symptoms. Cooking  Cook foods using methods other than frying. This may  include baking, steaming, grilling, or broiling. These are all methods that do not need a lot of fat for cooking.  To add flavor, try to use herbs that are low in spice and acidity. Meal planning  Choose healthy foods that are low in fat, such as fruits, vegetables, whole grains, low-fat dairy products, lean meats, fish, and poultry.  Eat frequent, small meals instead of three large meals each day. Eat your meals slowly, in a relaxed setting. Avoid bending over or lying down until 2-3 hours after eating.  Limit high-fat foods such as fatty meats or fried foods.  Limit your intake of fatty foods, such as oils, butter, and shortening.  Avoid the following as told by your health care provider: ? Foods that cause symptoms. These may be different for different people. Keep a food diary to keep track of foods that cause symptoms. ? Alcohol. ? Drinking large amounts of liquid with meals. ? Eating meals during the 2-3 hours before bed.   Lifestyle  Maintain a healthy weight. Ask your health care provider what weight is healthy for you. If you need to lose weight, work with your health care provider to do so safely.  Exercise for at least 30 minutes on 5 or more days each week, or as told by your health care provider.  Avoid wearing clothes that fit tightly around your waist and chest.  Do not use any products that contain nicotine or tobacco. These products include cigarettes, chewing tobacco, and vaping  devices, such as e-cigarettes. If you need help quitting, ask your health care provider.  Sleep with the head of your bed raised. Use a wedge under the mattress or blocks under the bed frame to raise the head of the bed.  Chew sugar-free gum after mealtimes. What foods should I eat? Eat a healthy, well-balanced diet of fruits, vegetables, whole grains, low-fat dairy products, lean meats, fish, and poultry. Each person is different. Foods that may trigger symptoms in one person may not trigger  any symptoms in another person. Work with your health care provider to identify foods that are safe for you. The items listed above may not be a complete list of recommended foods and beverages. Contact a dietitian for more information.   What foods should I avoid? Limiting some of these foods may help manage the symptoms of GERD. Everyone is different. Consult a dietitian or your health care provider to help you identify the exact foods to avoid, if any. Fruits Any fruits prepared with added fat. Any fruits that cause symptoms. For some people this may include citrus fruits, such as oranges, grapefruit, pineapple, and lemons. Vegetables Deep-fried vegetables. [place] fries. Any vegetables prepared with added fat. Any vegetables that cause symptoms. For some people, this may include tomatoes and tomato products, chili peppers, onions and garlic, and horseradish. Grains Pastries or quick breads with added fat. Meats and other proteins High-fat meats, such as fatty beef or pork, hot dogs, ribs, ham, sausage, salami, and bacon. Fried meat or protein, including fried fish and fried chicken. Nuts and nut butters, in large amounts. Dairy Whole milk and chocolate milk. Sour cream. Cream. Ice cream. Cream cheese. Milkshakes. Fats and oils Butter. Margarine. Shortening. Ghee. Beverages Coffee and tea, with or without caffeine. Carbonated beverages. Sodas. Energy drinks. Fruit juice made with acidic fruits, such as orange or grapefruit. Tomato juice. Alcoholic drinks. Sweets and desserts Chocolate and cocoa. Donuts. Seasonings and condiments Pepper. Peppermint and spearmint. Added salt. Any condiments, herbs, or seasonings that cause symptoms. For some people, this may include curry, hot sauce, or vinegar-based salad dressings. The items listed above may not be a complete list of foods and beverages to avoid. Contact a dietitian for more information. Questions to ask your health care provider Diet and  lifestyle changes are usually the first steps that are taken to manage symptoms of GERD. If diet and lifestyle changes do not improve your symptoms, talk with your health care provider about taking medicines. Where to find more information  International Foundation for Gastrointestinal Disorders: aboutgerd.org Summary  When you have gastroesophageal reflux disease (GERD), food and lifestyle choices may be very helpful in easing the discomfort of GERD.  Eat frequent, small meals instead of three large meals each day. Eat your meals slowly, in a relaxed setting. Avoid bending over or lying down until 2-3 hours after eating.  Limit high-fat foods such as fatty meats or fried foods. This information is not intended to replace advice given to you by your health care provider. Make sure you discuss any questions you have with your health care provider. Document Revised: 04/12/2020 Document Reviewed: 04/12/2020 Elsevier Patient Education  [place].  Lactose-Free Diet, Adult If you have lactose intolerance, you are not able to digest lactose. Lactose is a natural sugar found mainly in dairy milk and dairy products. You may need to avoid all foods and beverages that contain lactose. A lactose-free diet can help you do this. Which foods have lactose? Lactose is found  in dairy milk and dairy products, such as:  Yogurt.  Cheese.  Butter.  Margarine.  Sour cream.  Cream.  Whipped toppings and nondairy creamers.  Ice cream and other dairy-based desserts. Lactose is also found in foods or products made with dairy milk or milk ingredients. To find out whether a food contains dairy milk or a milk ingredient, look at the ingredients list. Avoid foods with the statement "May contain milk" and foods that contain:  Milk powder.  Whey.  Curd.  Caseinate.  Lactose.  Lactalbumin.  Lactoglobulin. What are alternatives to dairy milk and foods made with milk products?  Lactose-free  milk.  Soy milk with added calcium and vitamin D.  Almond milk, coconut milk, rice milk, or other nondairy milk alternatives with added calcium and vitamin D. Note that these are low in protein.  Soy products, such as soy yogurt, soy cheese, soy ice cream, and soy-based sour cream.  Other nut milk products, such as almond yogurt, almond cheese, cashew yogurt, cashew cheese, cashew ice cream, coconut yogurt, and coconut ice cream. What are tips for following this plan?  Do not consume foods, beverages, vitamins, minerals, or medicines containing lactose. Read ingredient lists carefully.  Look for the words "lactose-free" on labels.  Use lactase enzyme drops or tablets as directed by your health care provider.  Use lactose-free milk or a milk alternative, such as soy milk or almond milk, for drinking and cooking.  Make sure you get enough calcium and vitamin D in your diet. A lactose-free eating plan can be lacking in these important nutrients.  Take calcium and vitamin D supplements as directed by your health care provider. Talk to your health care provider about supplements if you are not able to get enough calcium and vitamin D from food. What foods can I eat? Fruits All fresh, canned, frozen, or dried fruits that are not processed with lactose. Vegetables All fresh, frozen, and canned vegetables without cheese, cream, or butter sauces. Grains Any that are not made with dairy milk or dairy products. Meats and other proteins Any meat, fish, poultry, and other protein sources that are not made with dairy milk or dairy products. Soy cheese and yogurt. Fats and oils Any that are not made with dairy milk or dairy products. Beverages Lactose-free milk. Soy, rice, or almond milk with added calcium and vitamin D. Fruit and vegetable juices. Sweets and desserts Any that are not made with dairy milk or dairy products. Seasonings and condiments Any that are not made with dairy milk or  dairy products. Calcium Calcium is found in many foods that contain lactose and is important for bone health. The amount of calcium you need depends on your age:  Adults younger than 50 years: 1,000 mg of calcium a day.  Adults older than 50 years: 1,200 mg of calcium a day. If you are not getting enough calcium, you may get it from other sources, including:  Orange juice with calcium added. There are 300-350 mg of calcium in 1 cup of orange juice.  Calcium-fortified soy milk. There are 300-400 mg of calcium in 1 cup of calcium-fortified soy milk.  Calcium-fortified rice or almond milk. There are 300 mg of calcium in 1 cup of calcium-fortified rice or almond milk.  Calcium-fortified breakfast cereals. There are 100-1,000 mg of calcium in calcium-fortified breakfast cereals.  Spinach, cooked. There are 145 mg of calcium in  cup of cooked spinach.  Edamame, cooked. There are 130 mg of calcium in  cup of cooked edamame.  Collard greens, cooked. There are 125 mg of calcium in  cup of cooked collard greens.  Kale, frozen or cooked. There are 90 mg of calcium in  cup of cooked or frozen kale.  Almonds. There are 95 mg of calcium in  cup of almonds.  Broccoli, cooked. There are 60 mg of calcium in 1 cup of cooked broccoli. The items listed above may not be a complete list of recommended foods and beverages. Contact a dietitian for more options.   What foods are not recommended? Fruits None, unless they are made with dairy milk or dairy products. Vegetables None, unless they are made with dairy milk or dairy products. Grains Any grains that are made with dairy milk or dairy products. Meats and other proteins None, unless they are made with dairy milk or dairy products. Dairy All dairy products, including milk, goat's milk, buttermilk, kefir, acidophilus milk, flavored milk, evaporated milk, condensed milk, dulce de [place], eggnog, yogurt, cheese, and cheese spreads. Fats and  oils Any that are made with milk or milk products. Margarines and salad dressings that contain milk or cheese. Cream. Half and half. Cream cheese. Sour cream. Chip dips made with sour cream or yogurt. Beverages Hot chocolate. Cocoa with lactose. Instant iced teas. Powdered fruit drinks. Smoothies made with dairy milk or yogurt. Sweets and desserts Any that are made with milk or milk products. Seasonings and condiments Chewing gum that has lactose. Spice blends if they contain lactose. Artificial sweeteners that contain lactose. Nondairy creamers. The items listed above may not be a complete list of foods and beverages to avoid. Contact a dietitian for more information. Summary  If you are lactose intolerant, it means that you have a hard time digesting lactose, a natural sugar found in milk and milk products.  Following a lactose-free diet can help you manage this condition.  Calcium is important for bone health and is found in many foods that contain lactose. Talk with your health care provider about other sources of calcium. This information is not intended to replace advice given to you by your health care provider. Make sure you discuss any questions you have with your health care provider. Document Revised: 10/30/2017 Document Reviewed: 10/30/2017 Elsevier Patient Education  2021 [employer].

## 2021-01-07 NOTE — Assessment & Plan Note (Addendum)
Patient reports chronic history of mild LUQ pain, but primarily left flank pain and back pain which she notices when bumping the area or putting her clothes on.  States it feels like a bruise.  No association with meals or bowel movements.  No nausea or vomiting.  Admits to urinary frequency but denies dysuria.  Reports PCP ordered an x-ray and this was normal.  On exam, patient has very mild TTP in the LUQ, but she gets progressively more tender along her left flank and left mid and lower back region. + CVA tenderness on the left.   Etiology is not clear.  Less likely complicated UTI/pyelonephritis as this has been going on for at least 1 year.  We will check for UTI with UA and urine culture and order CT A/P with reduced contrast for further evaluation.  I am updating creatinine.  If needed, we will proceed without contrast. Advised to hold Janumet day of CT and day after CT if proceeding with contrast.

## 2021-01-10 ENCOUNTER — Telehealth: Payer: Self-pay

## 2021-01-10 NOTE — Telephone Encounter (Signed)
PA for CT abd/pelvis submitted via [employer] website. Order ID: [id number]. Per website: Based on the information you have provided, the member's plan will not allow the use of an Out of Network Provider. This request will be routed to the health plan and the health plan will issue the determination. The health plan will contact you when their review has been completed. Anticipated determination date: 01/14/21.

## 2021-01-11 DIAGNOSIS — L602 Onychogryphosis: Secondary | ICD-10-CM | POA: Diagnosis not present

## 2021-01-11 DIAGNOSIS — L03031 Cellulitis of right toe: Secondary | ICD-10-CM | POA: Diagnosis not present

## 2021-01-11 DIAGNOSIS — L84 Corns and callosities: Secondary | ICD-10-CM | POA: Diagnosis not present

## 2021-01-11 DIAGNOSIS — B351 Tinea unguium: Secondary | ICD-10-CM | POA: Diagnosis not present

## 2021-01-11 DIAGNOSIS — E1149 Type 2 diabetes mellitus with other diabetic neurological complication: Secondary | ICD-10-CM | POA: Diagnosis not present

## 2021-01-11 DIAGNOSIS — M79674 Pain in right toe(s): Secondary | ICD-10-CM | POA: Diagnosis not present

## 2021-01-11 DIAGNOSIS — L600 Ingrowing nail: Secondary | ICD-10-CM | POA: Diagnosis not present

## 2021-01-11 DIAGNOSIS — M79675 Pain in left toe(s): Secondary | ICD-10-CM | POA: Diagnosis not present

## 2021-01-11 NOTE — Telephone Encounter (Signed)
Tiffany at [employer] called office, no PA needed for CT since Medicare is primary insurance. She voided PA. Call ref# [id number].

## 2021-01-17 ENCOUNTER — Encounter (HOSPITAL_COMMUNITY): Payer: Self-pay | Admitting: Physician Assistant

## 2021-01-17 ENCOUNTER — Inpatient Hospital Stay (HOSPITAL_COMMUNITY): Payer: Medicare Other | Attending: Hematology | Admitting: Physician Assistant

## 2021-01-17 ENCOUNTER — Other Ambulatory Visit (HOSPITAL_COMMUNITY)
Admission: RE | Admit: 2021-01-17 | Discharge: 2021-01-17 | Disposition: A | Discharge status: Home / Self Care | Payer: Medicare Other | Source: Ambulatory Visit | Attending: Physician Assistant | Admitting: Physician Assistant

## 2021-01-17 ENCOUNTER — Other Ambulatory Visit (HOSPITAL_COMMUNITY)
Admission: RE | Admit: 2021-01-17 | Discharge: 2021-01-17 | Disposition: A | Discharge status: Home / Self Care | Payer: Medicare Other | Source: Ambulatory Visit | Attending: Gastroenterology | Admitting: Gastroenterology

## 2021-01-17 ENCOUNTER — Other Ambulatory Visit: Payer: Self-pay

## 2021-01-17 ENCOUNTER — Telehealth: Payer: Self-pay | Admitting: Internal Medicine

## 2021-01-17 ENCOUNTER — Other Ambulatory Visit (HOSPITAL_COMMUNITY): Payer: Self-pay | Admitting: Physician Assistant

## 2021-01-17 VITALS — BP 133/78 | HR 82 | Temp 97.0°F | Resp 18 | Wt 197.5 lb

## 2021-01-17 DIAGNOSIS — D696 Thrombocytopenia, unspecified: Secondary | ICD-10-CM

## 2021-01-17 DIAGNOSIS — E559 Vitamin D deficiency, unspecified: Secondary | ICD-10-CM | POA: Diagnosis not present

## 2021-01-17 DIAGNOSIS — E119 Type 2 diabetes mellitus without complications: Secondary | ICD-10-CM | POA: Diagnosis not present

## 2021-01-17 DIAGNOSIS — R109 Unspecified abdominal pain: Secondary | ICD-10-CM | POA: Insufficient documentation

## 2021-01-17 DIAGNOSIS — R350 Frequency of micturition: Secondary | ICD-10-CM | POA: Insufficient documentation

## 2021-01-17 DIAGNOSIS — Z8719 Personal history of other diseases of the digestive system: Secondary | ICD-10-CM | POA: Insufficient documentation

## 2021-01-17 DIAGNOSIS — R634 Abnormal weight loss: Secondary | ICD-10-CM | POA: Diagnosis not present

## 2021-01-17 DIAGNOSIS — Z811 Family history of alcohol abuse and dependence: Secondary | ICD-10-CM | POA: Insufficient documentation

## 2021-01-17 DIAGNOSIS — R1012 Left upper quadrant pain: Secondary | ICD-10-CM | POA: Insufficient documentation

## 2021-01-17 DIAGNOSIS — G8929 Other chronic pain: Secondary | ICD-10-CM | POA: Insufficient documentation

## 2021-01-17 DIAGNOSIS — Z809 Family history of malignant neoplasm, unspecified: Secondary | ICD-10-CM | POA: Diagnosis not present

## 2021-01-17 DIAGNOSIS — Z888 Allergy status to other drugs, medicaments and biological substances status: Secondary | ICD-10-CM | POA: Diagnosis not present

## 2021-01-17 DIAGNOSIS — E669 Obesity, unspecified: Secondary | ICD-10-CM | POA: Diagnosis not present

## 2021-01-17 DIAGNOSIS — Z86010 Personal history of colonic polyps: Secondary | ICD-10-CM | POA: Diagnosis not present

## 2021-01-17 DIAGNOSIS — Z833 Family history of diabetes mellitus: Secondary | ICD-10-CM | POA: Insufficient documentation

## 2021-01-17 DIAGNOSIS — Z79899 Other long term (current) drug therapy: Secondary | ICD-10-CM | POA: Diagnosis not present

## 2021-01-17 DIAGNOSIS — K219 Gastro-esophageal reflux disease without esophagitis: Secondary | ICD-10-CM | POA: Insufficient documentation

## 2021-01-17 DIAGNOSIS — D649 Anemia, unspecified: Secondary | ICD-10-CM | POA: Diagnosis not present

## 2021-01-17 DIAGNOSIS — Z8379 Family history of other diseases of the digestive system: Secondary | ICD-10-CM | POA: Insufficient documentation

## 2021-01-17 DIAGNOSIS — Z8249 Family history of ischemic heart disease and other diseases of the circulatory system: Secondary | ICD-10-CM | POA: Diagnosis not present

## 2021-01-17 DIAGNOSIS — M549 Dorsalgia, unspecified: Secondary | ICD-10-CM | POA: Insufficient documentation

## 2021-01-17 LAB — CBC WITH DIFFERENTIAL/PLATELET
Abs Immature Granulocytes: 0.01 10*3/uL (ref 0.00–0.07)
Basophils Absolute: 0.1 10*3/uL (ref 0.0–0.1)
Basophils Relative: 1 %
Eosinophils Absolute: 0.5 10*3/uL (ref 0.0–0.5)
Eosinophils Relative: 8 %
HCT: 41.5 % (ref 36.0–46.0)
Hemoglobin: 12.8 g/dL (ref 12.0–15.0)
Immature Granulocytes: 0 %
Lymphocytes Relative: 31 %
Lymphs Abs: 1.9 10*3/uL (ref 0.7–4.0)
MCH: 27.2 pg (ref 26.0–34.0)
MCHC: 30.8 g/dL (ref 30.0–36.0)
MCV: 88.1 fL (ref 80.0–100.0)
Monocytes Absolute: 0.5 10*3/uL (ref 0.1–1.0)
Monocytes Relative: 8 %
Neutro Abs: 3.1 10*3/uL (ref 1.7–7.7)
Neutrophils Relative %: 52 %
Platelets: 84 10*3/uL — ABNORMAL LOW (ref 150–400)
RBC: 4.71 MIL/uL (ref 3.87–5.11)
RDW: 13.6 % (ref 11.5–15.5)
WBC: 5.9 10*3/uL (ref 4.0–10.5)
nRBC: 0 % (ref 0.0–0.2)

## 2021-01-17 LAB — URINALYSIS, ROUTINE W REFLEX MICROSCOPIC
Bacteria, UA: NONE SEEN
Bilirubin Urine: NEGATIVE
Glucose, UA: >=500 mg/dL — AB
Hgb urine dipstick: NEGATIVE
Ketones, ur: NEGATIVE mg/dL
Leukocytes,Ua: NEGATIVE
Nitrite: NEGATIVE
Protein, ur: NEGATIVE mg/dL
Specific Gravity, Urine: 1.018 (ref 1.005–1.030)
pH: 7 (ref 5.0–8.0)

## 2021-01-17 LAB — CREATININE, SERUM
Creatinine, Ser: 1.27 mg/dL — ABNORMAL HIGH (ref 0.44–1.00)
GFR, Estimated: 48 mL/min — ABNORMAL LOW (ref >60)

## 2021-01-17 LAB — COMPREHENSIVE METABOLIC PANEL
ALT: 23 U/L (ref 0–44)
AST: 28 U/L (ref 15–41)
Albumin: 4.2 g/dL (ref 3.5–5.0)
Alkaline Phosphatase: 68 U/L (ref 38–126)
Anion gap: 12 (ref 5–15)
BUN: 25 mg/dL — ABNORMAL HIGH (ref 8–23)
CO2: 26 mmol/L (ref 22–32)
Calcium: 10.4 mg/dL — ABNORMAL HIGH (ref 8.9–10.3)
Chloride: 102 mmol/L (ref 98–111)
Creatinine, Ser: 1.26 mg/dL — ABNORMAL HIGH (ref 0.44–1.00)
GFR, Estimated: 48 mL/min — ABNORMAL LOW (ref >60)
Glucose, Bld: 153 mg/dL — ABNORMAL HIGH (ref 70–99)
Potassium: 4.7 mmol/L (ref 3.5–5.1)
Sodium: 140 mmol/L (ref 135–145)
Total Bilirubin: 0.5 mg/dL (ref 0.3–1.2)
Total Protein: 7.9 g/dL (ref 6.5–8.1)

## 2021-01-17 LAB — VITAMIN D 25 HYDROXY (VIT D DEFICIENCY, FRACTURES): Vit D, 25-Hydroxy: 34.44 ng/mL (ref 30–100)

## 2021-01-17 LAB — VITAMIN B12: Vitamin B-12: 791 pg/mL (ref 180–914)

## 2021-01-17 LAB — LACTATE DEHYDROGENASE: LDH: 173 U/L (ref 98–192)

## 2021-01-17 LAB — FOLATE: Folate: 20.2 ng/mL (ref >5.9)

## 2021-01-17 NOTE — Telephone Encounter (Signed)
The [facility] called to say that the patient was there and they were going to draw her blood. They are needing [place] to fax over the lab orders on what we need. Fax number is [phone]

## 2021-01-17 NOTE — Patient Instructions (Signed)
[place] at [facility] Discharge Instructions  You were seen today by Tarri Abernethy PA-C for your low platelets.    LABS: Return in 4 months for labs   OTHER TESTS: None  MEDICATIONS: No changes  FOLLOW-UP APPOINTMENT: Office visit in 4 months   Thank you for choosing [place] at [facility] to provide your oncology and hematology care.  To afford each patient quality time with our provider, please arrive at least 15 minutes before your scheduled appointment time.   If you have a lab appointment with the [facility] please come in thru the Main Entrance and check in at the main information desk.  You need to re-schedule your appointment should you arrive 10 or more minutes late.  We strive to give you quality time with our providers, and arriving late affects you and other patients whose appointments are after yours.  Also, if you no show three or more times for appointments you may be dismissed from the clinic at the providers discretion.     Again, thank you for choosing [facility].  Our hope is that these requests will decrease the amount of time that you wait before being seen by our physicians.       _____________________________________________________________  Should you have questions after your visit to [facility], please contact our office at [phone] and follow the prompts.  Our office hours are 8:00 a.m. and 4:30 p.m. Monday - Friday.  Please note that voicemails left after 4:00 p.m. may not be returned until the following business day.  We are closed weekends and major holidays.  You do have access to a nurse 24-7, just call the main number to the clinic [phone] and do not press any options, hold on the line and a nurse will answer the phone.    For prescription refill requests, have your pharmacy contact our office and allow 72 hours.    Due to Covid, you will need to wear a mask  upon entering the hospital. If you do not have a mask, a mask will be given to you at the Main Entrance upon arrival. For doctor visits, patients may have 1 support person age 22 or older with them. For treatment visits, patients can not have anyone with them due to social distancing guidelines and our immunocompromised population.

## 2021-01-17 NOTE — Progress Notes (Addendum)
Barbara House, Barbara House [id number]   CLINIC:  Medical Oncology/Hematology  PCP:  Barbara House, [place] [address] [phone]   REASON FOR VISIT:  Follow-up for thrombocytopenia  CURRENT THERAPY: Observation  INTERVAL HISTORY:  Barbara House 65 y.o. female returns for routine follow-up of thrombocytopenia and history of normocytic anemia.  She was last seen on 09/02/2020 by Barbara House.  Since the time of her last visit, patient reports that she has been feeling well.  She denies any signs or symptoms of bleeding - no mucosal/gingival bleeding, no petechial rash, no hematemesis, hematochezia, melena, epistaxis, hematuria.  She has adjusted her diet due to her diabetes, and has had some weight loss by cutting back on simple carbohydrates and sugary desserts.  No interim infections or hospital stays since her last visit.  She has 100% energy and 100% appetite. She endorses that she is maintaining a stable weight.    REVIEW OF SYSTEMS:  Review of Systems  Constitutional: Negative for appetite change, chills, diaphoresis, fatigue, fever and unexpected weight change.  HENT:   Negative for lump/mass and nosebleeds.   Eyes: Negative for eye problems.  Respiratory: Negative for cough, hemoptysis and shortness of breath.   Cardiovascular: Negative for chest pain, leg swelling and palpitations.  Gastrointestinal: Negative for abdominal pain, blood in stool, constipation, diarrhea, nausea and vomiting.  Genitourinary: Negative for hematuria.   Skin: Negative.   Neurological: Negative for dizziness, headaches and light-headedness.  Hematological: Does not bruise/bleed easily.      PAST MEDICAL/SURGICAL HISTORY:  Past Medical History:  Diagnosis Date  . Allergic rhinitis   . Anxiety disorder   . Arthritis   . GERD (gastroesophageal reflux disease)   . Headache   . History of migraine   . Hypertension   .  Thrombocytopenia ([place])   . Type 2 diabetes mellitus ([place])    Not sure when she was diagnosed, greater than five years.    Past Surgical History:  Procedure Laterality Date  . ABDOMINAL HYSTERECTOMY     has ovaries.   [name] CARDIAC CATHETERIZATION N/A 08/03/2016   Procedure: Left Heart Cath and Coronary Angiography;  Surgeon: Peter M Martinique, MD;  Location: [facility];  Service: Cardiovascular;  Laterality: N/A;  . COLONOSCOPY WITH PROPOFOL N/A 11/05/2018   Dr. Oneida Alar: int/ext hemorrhoids, 6m cecal tubular adenoma removed, next TCS in 5-10 years  . ESOPHAGOGASTRODUODENOSCOPY (EGD) WITH PROPOFOL N/A 11/05/2018   Dr. FOneida Alar esophagus was dilated, mild gastritis with benign bx  . Miinor skin surgery    . POLYPECTOMY  11/05/2018   Procedure: POLYPECTOMY;  Surgeon: FDanie Binder MD;  [facility];  Service: Endoscopy;;  cecal polyp  . SAVORY DILATION N/A 11/05/2018   Procedure: SAVORY DILATION;  Surgeon: FDanie Binder MD;  Location: [facility];  Service: Endoscopy;  Laterality: N/A;     SOCIAL HISTORY:  Social History   Socioeconomic History  . Marital status: Single    Spouse name: Not on file  . Number of children: 1  . Years of education: Not on file  . Highest education level: Not on file  Occupational History  . Not on file  Tobacco Use  . Smoking status: Never Smoker  . Smokeless tobacco: Never Used  Vaping Use  . Vaping Use: Never used  Substance and Sexual Activity  . Alcohol use: No    Alcohol/week: 0.0 standard drinks  . Drug  use: No  . Sexual activity: Not Currently    Birth control/protection: Surgical  Other Topics Concern  . Not on file  Social History Narrative   Reports is on disability. Has one son. Lives in [place]. Enjoys time with family. Enjoys Firefighter. Attends church.    Social Determinants of Health   Financial Resource Strain: Not on file  Food Insecurity: Not on file  Transportation Needs: Not on file  Physical Activity:  Not on file  Stress: Not on file  Social Connections: Not on file  Intimate Partner Violence: Not on file    FAMILY HISTORY:  Family History  Problem Relation Age of Onset  . Hypertension Mother   . Diabetes Mother   . Hypertension Son   . Diabetes Son   . Cancer Father   . Cirrhosis Brother   . Alcohol abuse Brother   . Colon cancer Neg Hx     CURRENT MEDICATIONS:  Outpatient Encounter Medications as of 01/17/2021  Medication Sig  . atorvastatin (LIPITOR) 20 MG tablet Take 10 mg by mouth daily.  . cetirizine (ZYRTEC) 10 MG tablet Take 10 mg by mouth daily.  . chlorthalidone (HYGROTON) 25 MG tablet Take 25 mg by mouth every morning.  . Cyanocobalamin (VITAMIN B-12 PO) Take 1 tablet by mouth daily.  . cycloSPORINE (RESTASIS) 0.05 % ophthalmic emulsion Place 1 drop into both eyes 2 (two) times daily as needed (for eye dryness.).   [name] dexlansoprazole (DEXILANT) 60 MG capsule 1 PO EVERY MORNING WITH BREAKFAST.  [name] diclofenac Sodium (VOLTAREN) 1 % GEL Apply 2 g topically 4 (four) times daily. Apply to medial left elbow (Patient taking differently: Apply 2 g topically as needed. Apply to medial left elbow)  . FARXIGA 10 MG TABS tablet Take 10 mg by mouth daily.  . Fluocinolone Acetonide 0.01 % OIL Place in ear(s). (Patient not taking: Reported on 01/07/2021)  . GE100 BLOOD GLUCOSE TEST test strip CHECK BLOOD SUGAR ONCE DAILY OR AS DIRECTED BY PHYSICIAN  . glipiZIDE (GLUCOTROL XL) 10 MG 24 hr tablet Take 10 mg by mouth every morning.  [name] ipratropium (ATROVENT) 0.03 % nasal spray SMARTSIG:2 Spray(s) Both Nares 3 Times Daily PRN  . losartan (COZAAR) 100 MG tablet Take 100 mg by mouth daily.  . montelukast (SINGULAIR) 10 MG tablet TAKE 1 TABLET (10 MG TOTAL) BY MOUTH AT BEDTIME AS NEEDED (FOR ALLERGIES.). (Patient taking differently: Take 10 mg by mouth daily.)  . nystatin cream (MYCOSTATIN) Apply topically 2 (two) times daily as needed.  [name] PARoxetine (PAXIL) 40 MG tablet TAKE ONE TABLET BY  MOUTH EVERY MORNING. (Patient taking differently: Take 40 mg by mouth daily.)  . sitaGLIPtin-metformin (JANUMET) 50-1000 MG tablet Take 1 tablet by mouth 2 (two) times daily with a meal.  . verapamil (CALAN-SR) 240 MG CR tablet TAKE ONE TABLET BY MOUTH EVERY EVENING. (Patient taking differently: Take 240 mg by mouth daily after breakfast.)   No facility-administered encounter medications on file as of 01/17/2021.    ALLERGIES:  Allergies  Allergen Reactions  . Prednisone Other (See Comments)    Hair loss     PHYSICAL EXAM:  ECOG PERFORMANCE STATUS: 0 - Asymptomatic  There were no vitals filed for this visit. There were no vitals filed for this visit. Physical Exam Constitutional:      Appearance: Normal appearance. She is obese.  HENT:     Head: Normocephalic and atraumatic.     Mouth/Throat:     Mouth: Mucous membranes are moist.  Eyes:     Extraocular Movements: Extraocular movements intact.     Pupils: Pupils are equal, round, and reactive to light.  Cardiovascular:     Rate and Rhythm: Normal rate and regular rhythm.     Pulses: Normal pulses.     Heart sounds: Normal heart sounds.  Pulmonary:     Effort: Pulmonary effort is normal.     Breath sounds: Normal breath sounds.  Abdominal:     General: Bowel sounds are normal.     Palpations: Abdomen is soft.     Tenderness: There is no abdominal tenderness.  Musculoskeletal:        General: No swelling.     Right lower leg: No edema.     Left lower leg: No edema.  Lymphadenopathy:     Cervical: No cervical adenopathy.  Skin:    General: Skin is warm and dry.  Neurological:     General: No focal deficit present.     Mental Status: She is alert and oriented to person, place, and time.  Psychiatric:        Mood and Affect: Mood normal.        Behavior: Behavior normal.      LABORATORY DATA:  I have reviewed the labs as listed.  CBC    Component Value Date/Time   WBC 6.4 12/08/2020 1031   RBC 4.57 12/08/2020  1031   HGB 12.4 12/08/2020 1031   HGB 11.7 06/25/2018 1548   HCT 40.2 12/08/2020 1031   HCT 34.7 06/25/2018 1548   PLT 101 (L) 12/08/2020 1031   PLT 165 06/25/2018 1548   MCV 88.0 12/08/2020 1031   MCV 85 06/25/2018 1548   MCH 27.1 12/08/2020 1031   MCHC 30.8 12/08/2020 1031   RDW 13.7 12/08/2020 1031   RDW 13.5 06/25/2018 1548   LYMPHSABS 1.6 12/08/2020 1031   LYMPHSABS 2.4 06/25/2018 1548   MONOABS 0.5 12/08/2020 1031   EOSABS 0.4 12/08/2020 1031   EOSABS 0.3 06/25/2018 1548   BASOSABS 0.1 12/08/2020 1031   BASOSABS 0.0 06/25/2018 1548   CMP Latest Ref Rng & Units 12/08/2020 08/27/2020 06/01/2020  Glucose 70 - 99 mg/dL 179(H) 133(H) 170(H)  BUN 8 - 23 mg/dL 30(H) 36(H) 31(H)  Creatinine 0.44 - 1.00 mg/dL 1.34(H) 1.71(H) 1.66(H)  Sodium 135 - 145 mmol/L 139 138 139  Potassium 3.5 - 5.1 mmol/L 4.7 4.2 4.4  Chloride 98 - 111 mmol/L 103 104 102  CO2 22 - 32 mmol/L 25 21(L) 24  Calcium 8.9 - 10.3 mg/dL 10.3 10.2 10.2  Total Protein 6.5 - 8.1 g/dL 7.3 7.8 8.1  Total Bilirubin 0.3 - 1.2 mg/dL 0.5 0.4 0.3  Alkaline Phos 38 - 126 U/L 75 84 86  AST 15 - 41 U/L 24 33 25  ALT 0 - 44 U/L 20 22 23     DIAGNOSTIC IMAGING:  I have independently reviewed the relevant imaging and discussed with the patient.  ASSESSMENT: 1.  Thrombocytopenia, mild to moderate -Initial work-up unable to determine cause of thrombocytopenia - SPEP was normal, hepatitis panel was normal, RF and ANA were negative, no overt nutritional deficiencies (normal B12, folate, methylmalonic acid, copper)  -Pathologist smear review on 08/29/2019 showed mild thrombocytopenia, borderline anemia -Normal abdominal ultrasound from 08/15/2017 negative for liver abnormality, spleen was normal in size and appearance -Reported having one blood transfusion many years ago, but unsure of reason -Patient is taking vitamin B12 pills -Platelets have ranged from 80 to 109 since January 2020 -No major  bleeding events, no B symptoms, no  easy bruising -Differential diagnosis includes immune thrombocytopenia versus very early MDS (previous testing of hepatitis C, HIV, and H. pylori were negative) -CBC reviewed from 12/08/2020 with platelets 101, slightly improved from previous  2.  Normocytic anemia -Patient has had prior history of mild normocytic anemia with hemoglobin ranging from 11-12 -Most recent measures over the past 6 months have showed normal hemoglobin > 12.0 -Vitamin B12 most recently 971 -She was previously receiving monthly B12 injections from her PCP, this has been discontinued and she is now on oral vitamin B12 supplementation -CBC reviewed from 12/08/2020 with hemoglobin 12.4, MCV 88.0  3.  Vitamin D deficiency -Hypovitaminosis D evidenced in August 2021 with vitamin D level 23.76 -  Took supplement for a few months and had improved levels (vitamin D level checked on 12/08/2020 was within normal limits at 32.25) -Vitamin D supplements were stopped by PCP  PLAN:  1.  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  Normocytic anemia -Normocytic anemia of unknown cause appears to have resolved -No need for intervention at this time, will continue to monitor  3.  Vitamin D deficiency -Recheck vitamin D levels in 4 months   PLAN SUMMARY & DISPOSITION: -Labs in 4 months (Vitamin B12, Vitamin D, LDH, CMP, CBC) -RTC the week after labs for follow-up visit in office  All questions were answered. The patient knows to call the clinic with any problems, questions or concerns.  Medical decision making: Low  Time spent on visit: I spent 20 minutes counseling the patient face to face. The total time spent in the  appointment was 25 minutes and more than 50% was on counseling.   Harriett Rush, PA-C  01/17/21 9:31 AM

## 2021-01-17 NOTE — Telephone Encounter (Signed)
Pt already had paperwork from ov to have urine and culture done.

## 2021-01-19 LAB — URINE CULTURE

## 2021-01-20 LAB — COPPER, SERUM: Copper: 147 ug/dL (ref 80–158)

## 2021-01-21 ENCOUNTER — Other Ambulatory Visit (HOSPITAL_COMMUNITY): Payer: Self-pay | Admitting: Physician Assistant

## 2021-01-21 DIAGNOSIS — D696 Thrombocytopenia, unspecified: Secondary | ICD-10-CM

## 2021-01-27 ENCOUNTER — Other Ambulatory Visit (HOSPITAL_COMMUNITY)
Admission: RE | Admit: 2021-01-27 | Discharge: 2021-01-27 | Disposition: A | Discharge status: Home / Self Care | Payer: Medicare Other | Source: Ambulatory Visit | Attending: Internal Medicine | Admitting: Internal Medicine

## 2021-01-27 ENCOUNTER — Other Ambulatory Visit: Payer: Self-pay

## 2021-01-27 ENCOUNTER — Telehealth: Payer: Self-pay

## 2021-01-27 DIAGNOSIS — Z20822 Contact with and (suspected) exposure to covid-19: Secondary | ICD-10-CM | POA: Diagnosis not present

## 2021-01-27 DIAGNOSIS — Z01812 Encounter for preprocedural laboratory examination: Secondary | ICD-10-CM | POA: Insufficient documentation

## 2021-01-27 LAB — SARS CORONAVIRUS 2 (TAT 6-24 HRS): SARS Coronavirus 2: NEGATIVE

## 2021-01-27 NOTE — Telephone Encounter (Signed)
Received voicemail from pt, she's unable to make it to covid test appt today. EGD/-/+DIL is scheduled for 01/31/21.  Tried to call pt back, 1st time call was disconnected. 2nd time no answer.

## 2021-01-31 ENCOUNTER — Ambulatory Visit (HOSPITAL_COMMUNITY): Payer: Medicare Other | Admitting: Anesthesiology

## 2021-01-31 ENCOUNTER — Other Ambulatory Visit: Payer: Self-pay

## 2021-01-31 ENCOUNTER — Encounter (HOSPITAL_COMMUNITY): Payer: Self-pay

## 2021-01-31 ENCOUNTER — Ambulatory Visit (HOSPITAL_COMMUNITY)
Admission: RE | Admit: 2021-01-31 | Discharge: 2021-01-31 | Disposition: A | Discharge status: Home / Self Care | Payer: Medicare Other | Attending: Internal Medicine | Admitting: Internal Medicine

## 2021-01-31 ENCOUNTER — Encounter (HOSPITAL_COMMUNITY)
Admission: RE | Disposition: A | Discharge status: Home / Self Care | Payer: Self-pay | Source: Home / Self Care | Attending: Internal Medicine

## 2021-01-31 DIAGNOSIS — F419 Anxiety disorder, unspecified: Secondary | ICD-10-CM | POA: Diagnosis not present

## 2021-01-31 DIAGNOSIS — Z79899 Other long term (current) drug therapy: Secondary | ICD-10-CM | POA: Insufficient documentation

## 2021-01-31 DIAGNOSIS — K2950 Unspecified chronic gastritis without bleeding: Secondary | ICD-10-CM | POA: Diagnosis not present

## 2021-01-31 DIAGNOSIS — K2970 Gastritis, unspecified, without bleeding: Secondary | ICD-10-CM

## 2021-01-31 DIAGNOSIS — Z7984 Long term (current) use of oral hypoglycemic drugs: Secondary | ICD-10-CM | POA: Insufficient documentation

## 2021-01-31 DIAGNOSIS — R1310 Dysphagia, unspecified: Secondary | ICD-10-CM | POA: Insufficient documentation

## 2021-01-31 DIAGNOSIS — R12 Heartburn: Secondary | ICD-10-CM | POA: Insufficient documentation

## 2021-01-31 DIAGNOSIS — K219 Gastro-esophageal reflux disease without esophagitis: Secondary | ICD-10-CM | POA: Diagnosis not present

## 2021-01-31 HISTORY — PX: BIOPSY: SHX5522

## 2021-01-31 HISTORY — PX: ESOPHAGOGASTRODUODENOSCOPY (EGD) WITH PROPOFOL: SHX5813

## 2021-01-31 LAB — GLUCOSE, CAPILLARY: Glucose-Capillary: 138 mg/dL — ABNORMAL HIGH (ref 70–99)

## 2021-01-31 SURGERY — ESOPHAGOGASTRODUODENOSCOPY (EGD) WITH PROPOFOL
Anesthesia: General

## 2021-01-31 MED ORDER — PROPOFOL 500 MG/50ML IV EMUL
INTRAVENOUS | Status: DC | PRN
  Administered 2021-01-31: 50 ug/kg/min via INTRAVENOUS

## 2021-01-31 MED ORDER — LACTATED RINGERS IV SOLN: INTRAVENOUS | Status: DC

## 2021-01-31 MED ORDER — LIDOCAINE HCL (CARDIAC) PF 100 MG/5ML IV SOSY
PREFILLED_SYRINGE | INTRAVENOUS | Status: DC | PRN
  Administered 2021-01-31: 50 mg via INTRAVENOUS

## 2021-01-31 NOTE — Interval H&P Note (Signed)
History and Physical Interval Note:  01/31/2021 7:54 AM  Barbara House  has presented today for surgery, with the diagnosis of dysphagia, GERD.  The various methods of treatment have been discussed with the patient and family. After consideration of risks, benefits and other options for treatment, the patient has consented to  Procedure(s) with comments: ESOPHAGOGASTRODUODENOSCOPY (EGD) WITH PROPOFOL (N/A) - DO NOT MOVE TIME. Patient to arrive at 0700! BALLOON DILATION (N/A) as a surgical intervention.  The patient's history has been reviewed, patient examined, no change in status, stable for surgery.  I have reviewed the patient's chart and labs.  Questions were answered to the patient's satisfaction.     [name]

## 2021-01-31 NOTE — Transfer of Care (Signed)
Immediate Anesthesia Transfer of Care Note  Patient: Barbara House  Procedure(s) Performed: ESOPHAGOGASTRODUODENOSCOPY (EGD) WITH PROPOFOL (N/A ) BIOPSY  Patient Location: PACU  Anesthesia Type:General  Level of Consciousness: awake, alert , oriented and patient cooperative  Airway & Oxygen Therapy: Patient Spontanous Breathing  Post-op Assessment: Report given to RN, Post -op Vital signs reviewed and stable and Patient moving all extremities  Post vital signs: Reviewed and stable  Last Vitals:  Vitals Value Taken Time  BP    Temp    Pulse    Resp    SpO2      Last Pain:  Vitals:   01/31/21 0807  TempSrc:   PainSc: 0-No pain      Patients Stated Pain Goal: 10 (10/62/69 4854)  Complications: No complications documented.

## 2021-01-31 NOTE — Telephone Encounter (Signed)
Pt had procedure today.

## 2021-01-31 NOTE — Discharge Instructions (Signed)
EGD Discharge instructions Please read the instructions outlined below and refer to this sheet in the next few weeks. These discharge instructions provide you with general information on caring for yourself after you leave the hospital. Your doctor may also give you specific instructions. While your treatment has been planned according to the most current medical practices available, unavoidable complications occasionally occur. If you have any problems or questions after discharge, please call your doctor. ACTIVITY  You may resume your regular activity but move at a slower pace for the next 24 hours.   Take frequent rest periods for the next 24 hours.   Walking will help expel (get rid of) the air and reduce the bloated feeling in your abdomen.   No driving for 24 hours (because of the anesthesia (medicine) used during the test).   You may shower.   Do not sign any important legal documents or operate any machinery for 24 hours (because of the anesthesia used during the test).  NUTRITION  Drink plenty of fluids.   You may resume your normal diet.   Begin with a light meal and progress to your normal diet.   Avoid alcoholic beverages for 24 hours or as instructed by your caregiver.  MEDICATIONS  You may resume your normal medications unless your caregiver tells you otherwise.  WHAT YOU CAN EXPECT TODAY  You may experience abdominal discomfort such as a feeling of fullness or "gas" pains.  FOLLOW-UP  Your doctor will discuss the results of your test with you.  SEEK IMMEDIATE MEDICAL ATTENTION IF ANY OF THE FOLLOWING OCCUR:  Excessive nausea (feeling sick to your stomach) and/or vomiting.   Severe abdominal pain and distention (swelling).   Trouble swallowing.   Temperature over 101 F (37.8 C).   Rectal bleeding or vomiting of blood.    Your EGD revealed a large amount of food in your stomach.  You did have some inflammation which I biopsied to rule infection a bacteria  called H. pylori. I also took biopsies of your small bowel.  We may need to work you up for gastroparesis which is a condition that causes delayed emptying of your stomach given the large amount of food seen today.  I was unable to complete a full examination of your stomach due to large amount of food debris.  I did not perform dilation today due to risk of aspiration.  We can consider repeating in the future and you will need to be on clear liquids the entire day before procedure.  I can perform dilation at that time if you are still having issues.  Follow-up with GI in 2 to 3 months to further discuss.  I hope you have a great rest of your week!  Elon Alas. Abbey Chatters, D.O. Gastroenterology and Hepatology [facility]

## 2021-01-31 NOTE — Op Note (Signed)
[facility] Patient Name: Barbara House Procedure Date: 01/31/2021 7:56 AM MRN: [id number] Date of Birth: 1956-09-13 Attending MD: Elon Alas. Abbey Chatters DO CSN: [id number] Age: 65 Admit Type: Outpatient Procedure:                Upper GI endoscopy Indications:              Dysphagia, Heartburn Providers:                Elon Alas. Abbey Chatters, DO, Clarendon Page, [place]                            Risa Grill, Technician Referring MD:              Medicines:                See the Anesthesia note for documentation of the                            administered medications Complications:            No immediate complications. Estimated Blood Loss:     Estimated blood loss was minimal. Procedure:                Pre-Anesthesia Assessment:                           - The anesthesia plan was to use monitored                            anesthesia care (MAC).                           After obtaining informed consent, the endoscope was                            passed under direct vision. Throughout the                            procedure, the patient's blood pressure, pulse, and                            oxygen saturations were monitored continuously. The                            [id number]-H190 ([id number]) scope was introduced through the                            mouth, and advanced to the second part of duodenum.                            The upper GI endoscopy was accomplished without                            difficulty. The patient tolerated the procedure                            well. Scope In: 8:11:09 AM  Scope Out: 8:14:10 AM Total Procedure Duration: 0 hours 3 minutes 1 second  Findings:      There is no endoscopic evidence of bleeding, areas of erosion,       esophagitis, ulcerations or varices in the entire esophagus.      A large amount of food (residue) was found in the entire examined       stomach.      Localized mild inflammation characterized by erythema was found in  the       gastric antrum. Biopsies were taken with a cold forceps for Helicobacter       pylori testing.      The duodenal bulb, first portion of the duodenum and second portion of       the duodenum were normal. Biopsies for histology were taken with a cold       forceps for evaluation of celiac disease. Impression:               - A large amount of food (residue) in the stomach.                           - Gastritis. Biopsied.                           - Normal duodenal bulb, first portion of the                            duodenum and second portion of the duodenum.                            Biopsied.                           - Full examination of the stomach unable to be                            completed due to large amount of food in the                            stomach. Moderate Sedation:      Per Anesthesia Care Recommendation:           - Patient has a contact number available for                            emergencies. The signs and symptoms of potential                            delayed complications were discussed with the                            patient. Return to normal activities tomorrow.                            Written discharge instructions were provided to the                            patient.                           -  Resume previous diet.                           - Continue present medications.                           - Await pathology results.                           - Repeat upper endoscopy at appointment to be                            scheduled as examiantion was incomplete today due                            to large amount of food in the stomach. Dilation                            not performed.                           - May need work up for gastroparesis if risk                            factors.                           - Return to GI clinic in 3 months. Procedure Code(s):        --- Professional ---                            [phone], Esophagogastroduodenoscopy, flexible,                            transoral; with biopsy, single or multiple Diagnosis Code(s):        --- Professional ---                           K29.70, Gastritis, unspecified, without bleeding                           R13.10, Dysphagia, unspecified                           R12, Heartburn CPT copyright 2019 American Medical Association. All rights reserved. The codes documented in this report are preliminary and upon coder review may  be revised to meet current compliance requirements. Elon Alas. Abbey Chatters, DO [place] Abbey Chatters, DO 01/31/2021 8:18:00 AM This report has been signed electronically. Number of Addenda: 0

## 2021-01-31 NOTE — Anesthesia Postprocedure Evaluation (Signed)
Anesthesia Post Note  Patient: Barbara House  Procedure(s) Performed: ESOPHAGOGASTRODUODENOSCOPY (EGD) WITH PROPOFOL (N/A ) BIOPSY  Patient location during evaluation: PACU Anesthesia Type: General Level of consciousness: awake, oriented and awake and alert Pain management: satisfactory to patient Vital Signs Assessment: post-procedure vital signs reviewed and stable Respiratory status: spontaneous breathing, respiratory function stable and nonlabored ventilation Cardiovascular status: stable Postop Assessment: no apparent nausea or vomiting Anesthetic complications: no   No complications documented.   Last Vitals:  Vitals:   01/31/21 0726  BP: (!) 142/78  Resp: 19  Temp: 36.6 C  SpO2: 100%    Last Pain:  Vitals:   01/31/21 0807  TempSrc:   PainSc: 0-No pain                 Nyjai Graff

## 2021-01-31 NOTE — Addendum Note (Signed)
Addendum  created 01/31/21 1229 by Jonna Munro, CRNA   Charge Capture section accepted

## 2021-01-31 NOTE — Anesthesia Preprocedure Evaluation (Signed)
Anesthesia Evaluation  Patient identified by MRN, date of birth, ID band Patient awake    Reviewed: Allergy & Precautions, H&P , NPO status , Patient's Chart, lab work & pertinent test results, reviewed documented beta blocker date and time   Airway Mallampati: II  TM Distance: >3 FB Neck ROM: full    Dental no notable dental hx. (+) Teeth Intact   Pulmonary neg pulmonary ROS,    Pulmonary exam normal breath sounds clear to auscultation       Cardiovascular Exercise Tolerance: Good hypertension, negative cardio ROS   Rhythm:regular Rate:Normal     Neuro/Psych  Headaches, PSYCHIATRIC DISORDERS Anxiety    GI/Hepatic Neg liver ROS, GERD  Medicated,  Endo/Other  negative endocrine ROSdiabetes  Renal/GU negative Renal ROS  negative genitourinary   Musculoskeletal   Abdominal   Peds  Hematology negative hematology ROS (+)   Anesthesia Other Findings Cath 2017 - totally normal  Reproductive/Obstetrics negative OB ROS                             Anesthesia Physical Anesthesia Plan  ASA: II  Anesthesia Plan: General   Post-op Pain Management:    Induction:   PONV Risk Score and Plan: Propofol infusion  Airway Management Planned:   Additional Equipment:   Intra-op Plan:   Post-operative Plan:   Informed Consent: I have reviewed the patients History and Physical, chart, labs and discussed the procedure including the risks, benefits and alternatives for the proposed anesthesia with the patient or authorized representative who has indicated his/her understanding and acceptance.     Dental Advisory Given  Plan Discussed with: CRNA  Anesthesia Plan Comments:         Anesthesia Quick Evaluation

## 2021-02-02 LAB — SURGICAL PATHOLOGY

## 2021-02-03 ENCOUNTER — Encounter (HOSPITAL_COMMUNITY): Payer: Self-pay | Admitting: Internal Medicine

## 2021-02-03 ENCOUNTER — Ambulatory Visit (HOSPITAL_COMMUNITY)
Admission: RE | Admit: 2021-02-03 | Discharge: 2021-02-03 | Disposition: A | Discharge status: Home / Self Care | Payer: Medicare Other | Source: Ambulatory Visit | Attending: Gastroenterology | Admitting: Gastroenterology

## 2021-02-03 ENCOUNTER — Other Ambulatory Visit: Payer: Self-pay

## 2021-02-03 DIAGNOSIS — R1012 Left upper quadrant pain: Secondary | ICD-10-CM | POA: Diagnosis not present

## 2021-02-03 DIAGNOSIS — R109 Unspecified abdominal pain: Secondary | ICD-10-CM | POA: Insufficient documentation

## 2021-02-03 DIAGNOSIS — G8929 Other chronic pain: Secondary | ICD-10-CM | POA: Diagnosis not present

## 2021-02-03 DIAGNOSIS — M549 Dorsalgia, unspecified: Secondary | ICD-10-CM | POA: Diagnosis not present

## 2021-02-03 MED ORDER — IOHEXOL 300 MG/ML  SOLN
100.0000 mL | Freq: Once | INTRAMUSCULAR | Status: AC | PRN
  Administered 2021-02-03: 100 mL via INTRAVENOUS

## 2021-02-10 DIAGNOSIS — I10 Essential (primary) hypertension: Secondary | ICD-10-CM | POA: Diagnosis not present

## 2021-02-24 DIAGNOSIS — H9202 Otalgia, left ear: Secondary | ICD-10-CM | POA: Diagnosis not present

## 2021-02-24 DIAGNOSIS — E1143 Type 2 diabetes mellitus with diabetic autonomic (poly)neuropathy: Secondary | ICD-10-CM | POA: Diagnosis not present

## 2021-02-24 DIAGNOSIS — Z0000 Encounter for general adult medical examination without abnormal findings: Secondary | ICD-10-CM | POA: Diagnosis not present

## 2021-02-24 DIAGNOSIS — R911 Solitary pulmonary nodule: Secondary | ICD-10-CM | POA: Diagnosis not present

## 2021-02-24 DIAGNOSIS — E559 Vitamin D deficiency, unspecified: Secondary | ICD-10-CM | POA: Diagnosis not present

## 2021-02-24 DIAGNOSIS — E782 Mixed hyperlipidemia: Secondary | ICD-10-CM | POA: Diagnosis not present

## 2021-02-24 DIAGNOSIS — I10 Essential (primary) hypertension: Secondary | ICD-10-CM | POA: Diagnosis not present

## 2021-02-25 ENCOUNTER — Other Ambulatory Visit (HOSPITAL_COMMUNITY): Payer: Self-pay | Admitting: Family Medicine

## 2021-02-25 ENCOUNTER — Other Ambulatory Visit: Payer: Self-pay | Admitting: Family Medicine

## 2021-02-25 DIAGNOSIS — R911 Solitary pulmonary nodule: Secondary | ICD-10-CM

## 2021-03-11 DIAGNOSIS — I10 Essential (primary) hypertension: Secondary | ICD-10-CM | POA: Diagnosis not present

## 2021-03-24 ENCOUNTER — Other Ambulatory Visit (HOSPITAL_COMMUNITY): Payer: Self-pay | Admitting: Family Medicine

## 2021-03-24 DIAGNOSIS — Z1231 Encounter for screening mammogram for malignant neoplasm of breast: Secondary | ICD-10-CM

## 2021-03-28 ENCOUNTER — Ambulatory Visit: Payer: Medicare Other | Admitting: Podiatry

## 2021-03-28 DIAGNOSIS — M2040 Other hammer toe(s) (acquired), unspecified foot: Secondary | ICD-10-CM | POA: Insufficient documentation

## 2021-03-28 DIAGNOSIS — G8929 Other chronic pain: Secondary | ICD-10-CM | POA: Insufficient documentation

## 2021-03-28 DIAGNOSIS — J329 Chronic sinusitis, unspecified: Secondary | ICD-10-CM | POA: Insufficient documentation

## 2021-03-28 DIAGNOSIS — R911 Solitary pulmonary nodule: Secondary | ICD-10-CM | POA: Insufficient documentation

## 2021-03-28 DIAGNOSIS — E559 Vitamin D deficiency, unspecified: Secondary | ICD-10-CM | POA: Insufficient documentation

## 2021-03-28 DIAGNOSIS — E7800 Pure hypercholesterolemia, unspecified: Secondary | ICD-10-CM | POA: Insufficient documentation

## 2021-04-01 ENCOUNTER — Ambulatory Visit (HOSPITAL_COMMUNITY)
Admission: RE | Admit: 2021-04-01 | Discharge: 2021-04-01 | Disposition: A | Discharge status: Home / Self Care | Payer: Medicare Other | Source: Ambulatory Visit | Attending: Family Medicine | Admitting: Family Medicine

## 2021-04-01 ENCOUNTER — Other Ambulatory Visit: Payer: Self-pay

## 2021-04-01 DIAGNOSIS — N183 Chronic kidney disease, stage 3 unspecified: Secondary | ICD-10-CM | POA: Diagnosis not present

## 2021-04-01 DIAGNOSIS — Z1231 Encounter for screening mammogram for malignant neoplasm of breast: Secondary | ICD-10-CM | POA: Diagnosis not present

## 2021-04-06 DIAGNOSIS — H60502 Unspecified acute noninfective otitis externa, left ear: Secondary | ICD-10-CM | POA: Diagnosis not present

## 2021-04-13 DIAGNOSIS — I10 Essential (primary) hypertension: Secondary | ICD-10-CM | POA: Diagnosis not present

## 2021-04-20 NOTE — Progress Notes (Signed)
Referring Provider: Caren Macadam, MD Primary Care Physician:  Caren Macadam, MD Primary GI Physician: Dr. Abbey Chatters  Chief Complaint  Patient presents with   Abdominal Pain    Off and on, across lower abd   Gastroesophageal Reflux    occ   Dysphagia    Occ, but better since proc    HPI:   Barbara House is a 65 y.o. female presenting today with a history of chronic GERD, dysphagia s/p dilation in 2020, gastroparesis with documented delayed gastric emptying May 2021, adenomatous colon polyps due for colonoscopy between 2025-2030. Followed by Hematology for thrombocytopenia.   Last seen in our office 01/07/2021.  GERD was fairly well controlled on Dexilant 60 mg daily.  She reported globus sensation and stated that she saw an asthma and sinus provider and was told this was possibly secondary to GERD.  Also report of return of solid food dysphagia over the last year with prior dilation helpful.  Intermittent lower abdominal pain prior to having a bowel movement that relieved thereafter, triggered by eating a lot of greens.  No alarm symptoms.  Upper abdominal discomfort associated with gas and is triggered by dairy products or onions.  Continued with left-sided soreness that felt like a bruise and notices most when putting on her clothes or bumping the area.  No association with meals or bowel movements.  Symptoms present x1 year.  On exam, she had very mild TTP in the LUQ which became progressively more tender along her left flank and left mid and lower back region. Plan to check UA and urine culture, CT A/P, continue Dexilant, schedule EGD with possible dilation, lactose-free diet, avoiding common gas producing items.   UA ok. Culture suggestive of contamination. Recommended follow-up with PCP.  CT A/P with contrast with no acute findings.  She did have 7 mm groundglass subpleural nodule in the right lower lobe with recommendations for CT in 6 to 12 months.  Also with changes in her back  that are consistent with arthritis and slight malalignment of L4-L5.  Suspected MSK etiology of left-sided pain.  Recommended follow-up with PCP on lung nodule.  EGD 01/31/21: Normal esophagus, large amount of food residue in the stomach, gastritis biopsied (mild chronic gastritis, negative for H. pylori), normal examined duodenum s/p biopsy (benign).  Recommended repeat EGD in the future as dilation was not performed.    Today:   GERD: Fairly well controlled on Dexilant. Occasional breakthrough symptoms.   Dysphagia: Continues to be about the same. Solid food dysphagia about once every other week. Would like to repeat the EGD. Doesn't think she followed instructions for clear liquids.   Gastroparesis: Trying to bake more of her foods. No nausea or vomiting. Occasional early satiety but other times she feels like she doesn't get enough food.   Abdominal Pain: Intermittent lower abdominal pain prior to Bms or prior to urination. Improves after Bms or urination. Occurs about twice a week. Doesn't feel she needs anything for this. Bowels move 2-3 times a day. Stools are soft and formed. No blood in the stool or black stool.  No dysuria.  Symptoms are about the same as last visit.  Past Medical History:  Diagnosis Date   Allergic rhinitis    Anxiety disorder    Arthritis    Gastroparesis    GERD (gastroesophageal reflux disease)    Headache    History of migraine    Hx of adenomatous colonic polyps    Hypertension  Thrombocytopenia ([place])    Type 2 diabetes mellitus ([place])    Not sure when she was diagnosed, greater than five years.     Past Surgical History:  Procedure Laterality Date   ABDOMINAL HYSTERECTOMY     has ovaries.    BIOPSY  01/31/2021   Procedure: BIOPSY;  Surgeon: [name], DO;  [facility]: [facility];  Service: Endoscopy;;   CARDIAC CATHETERIZATION N/A 08/03/2016   Procedure: Left Heart Cath and Coronary Angiography;  Surgeon: Peter M Martinique, MD;  Location:  [facility];  Service: Cardiovascular;  Laterality: N/A;   COLONOSCOPY WITH PROPOFOL N/A 11/05/2018   Dr. Oneida Alar: int/ext hemorrhoids, 49mm cecal tubular adenoma removed, next TCS in 5-10 years   ESOPHAGOGASTRODUODENOSCOPY (EGD) WITH PROPOFOL N/A 11/05/2018   Dr. Oneida Alar: esophagus was dilated, mild gastritis with benign bx   ESOPHAGOGASTRODUODENOSCOPY (EGD) WITH PROPOFOL N/A 01/31/2021   Surgeon: [name], DO;  Normal esophagus, large amount of food residue in the stomach, gastritis biopsied (mild chronic gastritis, negative for H. pylori), normal examined duodenum s/p biopsy (benign).   Miinor skin surgery     POLYPECTOMY  11/05/2018   Procedure: POLYPECTOMY;  Surgeon: Danie Binder, MD;  [facility]: [facility];  Service: Endoscopy;;  cecal polyp   SAVORY DILATION N/A 11/05/2018   Procedure: SAVORY DILATION;  Surgeon: Danie Binder, MD;  Location: [facility];  Service: Endoscopy;  Laterality: N/A;    Current Outpatient Medications  Medication Sig Dispense Refill   atorvastatin (LIPITOR) 20 MG tablet Take 20 mg by mouth in the morning.     calcium carbonate (OSCAL) 1500 (600 Ca) MG TABS tablet Take by mouth daily.     cetirizine (ZYRTEC) 10 MG tablet Take 10 mg by mouth daily as needed for allergies.     chlorthalidone (HYGROTON) 25 MG tablet Take 25 mg by mouth every morning.     Cholecalciferol (VITAMIN D3) 125 MCG (5000 UT) CAPS daily.     cycloSPORINE (RESTASIS) 0.05 % ophthalmic emulsion Place 1 drop into both eyes 2 (two) times daily as needed (dry eyes).     dexlansoprazole (DEXILANT) 60 MG capsule 1 PO EVERY MORNING WITH BREAKFAST. (Patient taking differently: Take 60 mg by mouth in the morning.) 90 capsule 3   diclofenac Sodium (VOLTAREN) 1 % GEL Apply 2 g topically 4 (four) times daily. Apply to medial left elbow (Patient taking differently: Apply 2 g topically 4 (four) times daily as needed (pain).) 150 g 1   FARXIGA 10 MG TABS tablet Take 10 mg by mouth in  the morning.     Fluocinolone Acetonide 0.01 % OIL Place 1 application in ear(s) 2 (two) times daily as needed (ear itching).     GE100 BLOOD GLUCOSE TEST test strip CHECK BLOOD SUGAR ONCE DAILY OR AS DIRECTED BY PHYSICIAN     glipiZIDE (GLUCOTROL XL) 10 MG 24 hr tablet Take 10 mg by mouth every morning.     ipratropium (ATROVENT) 0.03 % nasal spray Place 2 sprays into both nostrils 3 (three) times daily as needed for rhinitis.     losartan (COZAAR) 100 MG tablet Take 100 mg by mouth in the morning.     losartan (COZAAR) 100 MG tablet Take 1 tablet by mouth daily.     montelukast (SINGULAIR) 10 MG tablet TAKE 1 TABLET (10 MG TOTAL) BY MOUTH AT BEDTIME AS NEEDED (FOR ALLERGIES.). (Patient taking differently: Take 10 mg by mouth in the morning.) 90 tablet 1  nystatin cream (MYCOSTATIN) Apply 1 application topically 2 (two) times daily as needed (vaginal itching).     PARoxetine (PAXIL) 40 MG tablet TAKE ONE TABLET BY MOUTH EVERY MORNING. (Patient taking differently: Take 40 mg by mouth in the morning.) 90 tablet 0   sitaGLIPtin-metformin (JANUMET) 50-1000 MG tablet Take 1 tablet by mouth 2 (two) times daily with a meal.     verapamil (CALAN-SR) 240 MG CR tablet TAKE ONE TABLET BY MOUTH EVERY EVENING. (Patient taking differently: Take 240 mg by mouth in the morning.) 90 tablet 0   vitamin B-12 (CYANOCOBALAMIN) 500 MCG tablet Take 500 mcg by mouth in the morning.     No current facility-administered medications for this visit.    Allergies as of 04/21/2021 - Review Complete 04/21/2021  Allergen Reaction Noted   Prednisone Other (See Comments) 09/15/2016    Family History  Problem Relation Age of Onset   Hypertension Mother    Diabetes Mother    Hypertension Son    Diabetes Son    Cancer Father    Cirrhosis Brother    Alcohol abuse Brother    Colon cancer Neg Hx     Social History   Socioeconomic History   Marital status: Single    Spouse name: Not on file   Number of children: 1    Years of education: Not on file   Highest education level: Not on file  Occupational History   Not on file  Tobacco Use   Smoking status: Never   Smokeless tobacco: Never  Vaping Use   Vaping Use: Never used  Substance and Sexual Activity   Alcohol use: No    Alcohol/week: 0.0 standard drinks   Drug use: No   Sexual activity: Not Currently    Birth control/protection: Surgical  Other Topics Concern   Not on file  Social History Narrative   Reports is on disability. Has one son. Lives in [place]. Enjoys time with family. Enjoys Firefighter. Attends church.    Social Determinants of Health   Financial Resource Strain: Low Risk    Difficulty of Paying Living Expenses: Not very hard  Food Insecurity: No Food Insecurity   Worried About Charity fundraiser in the Last Year: Never true   Ran Out of Food in the Last Year: Never true  Transportation Needs: No Transportation Needs   Lack of Transportation (Medical): No   Lack of Transportation (Non-Medical): No  Physical Activity: Insufficiently Active   Days of Exercise per Week: 3 days   Minutes of Exercise per Session: 20 min  Stress: Stress Concern Present   Feeling of Stress : To some extent  Social Connections: Moderately Isolated   Frequency of Communication with Friends and Family: More than three times a week   Frequency of Social Gatherings with Friends and Family: More than three times a week   Attends Religious Services: More than 4 times per year   Active Member of [employer] or Organizations: No   Attends Music therapist: Not on file   Marital Status: Never married    Review of Systems: Gen: Denies fever, chills, cold or flulike symptoms, presyncope, syncope. CV: Denies chest pain or palpitations. Resp: Denies dyspnea or cough. GI: See HPI Heme: See HPI  Physical Exam: BP 115/75   Pulse 75   Temp (!) 97.3 F (36.3 C) (Temporal)   Ht 5\' 1"  (1.549 m)   Wt 195 lb 12.8 oz (88.8 kg)   BMI 37.00 kg/m  General:   Alert and oriented. No distress noted. Pleasant and cooperative.  Head:  Normocephalic and atraumatic. Eyes:  Conjuctiva clear without scleral icterus. Heart:  S1, S2 present without murmurs appreciated. Lungs:  Clear to auscultation bilaterally. No wheezes, rales, or rhonchi. No distress.  Abdomen:  +BS, soft, and non-distended. Mild chronic TTP in LUQ and LLQ that becomes more tender in the flank/left back without change since last visit. No rebound or guarding. No HSM or masses noted. Msk:  Symmetrical without gross deformities. Normal posture. Extremities:  Without edema. Neurologic:  Alert and  oriented x4 Psych:  Normal mood and affect.    Assessment:  65 year old female with history of GERD, dysphagia, gastroparesis, adenomatous colon polyps, thrombocytopenia following with hematology presenting today for follow-up of GERD, dysphagia s/p EGD. Also discussed gastroparesis and lower abdominal pain.  Dysphagia: EGD 01/31/2021 with normal-appearing esophagus.  Unable to perform empiric dilation due to large amount of residual food in the stomach. Unfortunately, she did not follow instructions for clear liquids after 6pm day prior to procedure and has gastroparesis. She continues with solid food dysphagia about once every other week which is about the same.  Foods will go down with drinking liquids. We will reschedule EGD and she will follow a clear liquid diet for 24 hrs prior to her procedure.   GERD: Well-controlled on Dexilant 60 mg daily.  Gastroparesis: Fairly asymptomatic.  Occasional early satiety, but nothing significant.  No nausea or vomiting.  She was counseled on gastroparesis diet today.  Lower abdominal pain: Several month history of intermittent lower abdominal pain occurring about twice a week that can be prior to bowel movements and/or prior to urination.  Notes improvement sometimes after bowel movements and/or after urinating.  Denies dysuria, constipation,  diarrhea, BRBPR, melena.  CT in April 2022 with no significant abdominal or pelvic findings, and she has not had any significant change in symptoms since then.  She did have a urine culture in April that had many species with recommendations for recollection.  She was to follow with PCP on this, but has not.  Suspect she may have a component of IBS.  Less likely UTI, but will recheck UA and urine culture.  She does not want any prescriptive medications to help with abdominal pain at this time.  Recommended trial of IBgard.  Samples provided.  Plan:  1.  Proceed with repeat EGD with esophageal dilation with propofol with Dr. Abbey Chatters in the near future. The risks, benefits, and alternatives have been discussed with the patient in detail. The patient states understanding and desires to proceed.  ASA II Due to retained food contents on last EGD and history of gastroparesis, will have patient follow clear liquid diet 24 hours prior to procedure. She was separate given instructions for diabetes medication adjustments.  2.  Continue Dexilant 60 mg daily.  3.  Counseled on gastroparesis diet.  Written instructions provided.  4.  Complete UA and urine culture.  5.  Trial IBgard to help with intermittent lower abdominal pain associated with bowel movements.  6.  Follow-up after EGD.    Barbara Altes, PA-C [facility] 04/21/2021

## 2021-04-20 NOTE — H&P (View-Only) (Signed)
Referring Provider: Caren Macadam, MD Primary Care Physician:  Caren Macadam, MD Primary GI Physician: Dr. Abbey Chatters  Chief Complaint  Patient presents with   Abdominal Pain    Off and on, across lower abd   Gastroesophageal Reflux    occ   Dysphagia    Occ, but better since proc    HPI:   Barbara House is a 65 y.o. female presenting today with a history of chronic GERD, dysphagia s/p dilation in 2020, gastroparesis with documented delayed gastric emptying May 2021, adenomatous colon polyps due for colonoscopy between 2025-2030. Followed by Hematology for thrombocytopenia.   Last seen in our office 01/07/2021.  GERD was fairly well controlled on Dexilant 60 mg daily.  She reported globus sensation and stated that she saw an asthma and sinus provider and was told this was possibly secondary to GERD.  Also report of return of solid food dysphagia over the last year with prior dilation helpful.  Intermittent lower abdominal pain prior to having a bowel movement that relieved thereafter, triggered by eating a lot of greens.  No alarm symptoms.  Upper abdominal discomfort associated with gas and is triggered by dairy products or onions.  Continued with left-sided soreness that felt like a bruise and notices most when putting on her clothes or bumping the area.  No association with meals or bowel movements.  Symptoms present x1 year.  On exam, she had very mild TTP in the LUQ which became progressively more tender along her left flank and left mid and lower back region. Plan to check UA and urine culture, CT A/P, continue Dexilant, schedule EGD with possible dilation, lactose-free diet, avoiding common gas producing items.   UA ok. Culture suggestive of contamination. Recommended follow-up with PCP.  CT A/P with contrast with no acute findings.  She did have 7 mm groundglass subpleural nodule in the right lower lobe with recommendations for CT in 6 to 12 months.  Also with changes in her back  that are consistent with arthritis and slight malalignment of L4-L5.  Suspected MSK etiology of left-sided pain.  Recommended follow-up with PCP on lung nodule.  EGD 01/31/21: Normal esophagus, large amount of food residue in the stomach, gastritis biopsied (mild chronic gastritis, negative for H. pylori), normal examined duodenum s/p biopsy (benign).  Recommended repeat EGD in the future as dilation was not performed.    Today:   GERD: Fairly well controlled on Dexilant. Occasional breakthrough symptoms.   Dysphagia: Continues to be about the same. Solid food dysphagia about once every other week. Would like to repeat the EGD. Doesn't think she followed instructions for clear liquids.   Gastroparesis: Trying to bake more of her foods. No nausea or vomiting. Occasional early satiety but other times she feels like she doesn't get enough food.   Abdominal Pain: Intermittent lower abdominal pain prior to Bms or prior to urination. Improves after Bms or urination. Occurs about twice a week. Doesn't feel she needs anything for this. Bowels move 2-3 times a day. Stools are soft and formed. No blood in the stool or black stool.  No dysuria.  Symptoms are about the same as last visit.  Past Medical History:  Diagnosis Date   Allergic rhinitis    Anxiety disorder    Arthritis    Gastroparesis    GERD (gastroesophageal reflux disease)    Headache    History of migraine    Hx of adenomatous colonic polyps    Hypertension  Thrombocytopenia ([place])    Type 2 diabetes mellitus ([place])    Not sure when she was diagnosed, greater than five years.     Past Surgical History:  Procedure Laterality Date   ABDOMINAL HYSTERECTOMY     has ovaries.    BIOPSY  01/31/2021   Procedure: BIOPSY;  Surgeon: [name], DO;  [facility];  Service: Endoscopy;;   CARDIAC CATHETERIZATION N/A 08/03/2016   Procedure: Left Heart Cath and Coronary Angiography;  Surgeon: Peter M Martinique, MD;  Location:  [facility];  Service: Cardiovascular;  Laterality: N/A;   COLONOSCOPY WITH PROPOFOL N/A 11/05/2018   Dr. Oneida Alar: int/ext hemorrhoids, 49mm cecal tubular adenoma removed, next TCS in 5-10 years   ESOPHAGOGASTRODUODENOSCOPY (EGD) WITH PROPOFOL N/A 11/05/2018   Dr. Oneida Alar: esophagus was dilated, mild gastritis with benign bx   ESOPHAGOGASTRODUODENOSCOPY (EGD) WITH PROPOFOL N/A 01/31/2021   Surgeon: [name], DO;  Normal esophagus, large amount of food residue in the stomach, gastritis biopsied (mild chronic gastritis, negative for H. pylori), normal examined duodenum s/p biopsy (benign).   Miinor skin surgery     POLYPECTOMY  11/05/2018   Procedure: POLYPECTOMY;  Surgeon: Danie Binder, MD;  [facility]: [facility];  Service: Endoscopy;;  cecal polyp   SAVORY DILATION N/A 11/05/2018   Procedure: SAVORY DILATION;  Surgeon: Danie Binder, MD;  [facility]: [facility];  Service: Endoscopy;  Laterality: N/A;    Current Outpatient Medications  Medication Sig Dispense Refill   atorvastatin (LIPITOR) 20 MG tablet Take 20 mg by mouth in the morning.     calcium carbonate (OSCAL) 1500 (600 Ca) MG TABS tablet Take by mouth daily.     cetirizine (ZYRTEC) 10 MG tablet Take 10 mg by mouth daily as needed for allergies.     chlorthalidone (HYGROTON) 25 MG tablet Take 25 mg by mouth every morning.     Cholecalciferol (VITAMIN D3) 125 MCG (5000 UT) CAPS daily.     cycloSPORINE (RESTASIS) 0.05 % ophthalmic emulsion Place 1 drop into both eyes 2 (two) times daily as needed (dry eyes).     dexlansoprazole (DEXILANT) 60 MG capsule 1 PO EVERY MORNING WITH BREAKFAST. (Patient taking differently: Take 60 mg by mouth in the morning.) 90 capsule 3   diclofenac Sodium (VOLTAREN) 1 % GEL Apply 2 g topically 4 (four) times daily. Apply to medial left elbow (Patient taking differently: Apply 2 g topically 4 (four) times daily as needed (pain).) 150 g 1   FARXIGA 10 MG TABS tablet Take 10 mg by mouth in  the morning.     Fluocinolone Acetonide 0.01 % OIL Place 1 application in ear(s) 2 (two) times daily as needed (ear itching).     GE100 BLOOD GLUCOSE TEST test strip CHECK BLOOD SUGAR ONCE DAILY OR AS DIRECTED BY PHYSICIAN     glipiZIDE (GLUCOTROL XL) 10 MG 24 hr tablet Take 10 mg by mouth every morning.     ipratropium (ATROVENT) 0.03 % nasal spray Place 2 sprays into both nostrils 3 (three) times daily as needed for rhinitis.     losartan (COZAAR) 100 MG tablet Take 100 mg by mouth in the morning.     losartan (COZAAR) 100 MG tablet Take 1 tablet by mouth daily.     montelukast (SINGULAIR) 10 MG tablet TAKE 1 TABLET (10 MG TOTAL) BY MOUTH AT BEDTIME AS NEEDED (FOR ALLERGIES.). (Patient taking differently: Take 10 mg by mouth in the morning.) 90 tablet 1  nystatin cream (MYCOSTATIN) Apply 1 application topically 2 (two) times daily as needed (vaginal itching).     PARoxetine (PAXIL) 40 MG tablet TAKE ONE TABLET BY MOUTH EVERY MORNING. (Patient taking differently: Take 40 mg by mouth in the morning.) 90 tablet 0   sitaGLIPtin-metformin (JANUMET) 50-1000 MG tablet Take 1 tablet by mouth 2 (two) times daily with a meal.     verapamil (CALAN-SR) 240 MG CR tablet TAKE ONE TABLET BY MOUTH EVERY EVENING. (Patient taking differently: Take 240 mg by mouth in the morning.) 90 tablet 0   vitamin B-12 (CYANOCOBALAMIN) 500 MCG tablet Take 500 mcg by mouth in the morning.     No current facility-administered medications for this visit.    Allergies as of 04/21/2021 - Review Complete 04/21/2021  Allergen Reaction Noted   Prednisone Other (See Comments) 09/15/2016    Family History  Problem Relation Age of Onset   Hypertension Mother    Diabetes Mother    Hypertension Son    Diabetes Son    Cancer Father    Cirrhosis Brother    Alcohol abuse Brother    Colon cancer Neg Hx     Social History   Socioeconomic History   Marital status: Single    Spouse name: Not on file   Number of children: 1    Years of education: Not on file   Highest education level: Not on file  Occupational History   Not on file  Tobacco Use   Smoking status: Never   Smokeless tobacco: Never  Vaping Use   Vaping Use: Never used  Substance and Sexual Activity   Alcohol use: No    Alcohol/week: 0.0 standard drinks   Drug use: No   Sexual activity: Not Currently    Birth control/protection: Surgical  Other Topics Concern   Not on file  Social History Narrative   Reports is on disability. Has one son. Lives in [place]. Enjoys time with family. Enjoys Firefighter. Attends church.    Social Determinants of Health   Financial Resource Strain: Low Risk    Difficulty of Paying Living Expenses: Not very hard  Food Insecurity: No Food Insecurity   Worried About Charity fundraiser in the Last Year: Never true   Ran Out of Food in the Last Year: Never true  Transportation Needs: No Transportation Needs   Lack of Transportation (Medical): No   Lack of Transportation (Non-Medical): No  Physical Activity: Insufficiently Active   Days of Exercise per Week: 3 days   Minutes of Exercise per Session: 20 min  Stress: Stress Concern Present   Feeling of Stress : To some extent  Social Connections: Moderately Isolated   Frequency of Communication with Friends and Family: More than three times a week   Frequency of Social Gatherings with Friends and Family: More than three times a week   Attends Religious Services: More than 4 times per year   Active Member of [employer] or Organizations: No   Attends Music therapist: Not on file   Marital Status: Never married    Review of Systems: Gen: Denies fever, chills, cold or flulike symptoms, presyncope, syncope. CV: Denies chest pain or palpitations. Resp: Denies dyspnea or cough. GI: See HPI Heme: See HPI  Physical Exam: BP 115/75   Pulse 75   Temp (!) 97.3 F (36.3 C) (Temporal)   Ht 5\' 1"  (1.549 m)   Wt 195 lb 12.8 oz (88.8 kg)   BMI 37.00 kg/m  General:   Alert and oriented. No distress noted. Pleasant and cooperative.  Head:  Normocephalic and atraumatic. Eyes:  Conjuctiva clear without scleral icterus. Heart:  S1, S2 present without murmurs appreciated. Lungs:  Clear to auscultation bilaterally. No wheezes, rales, or rhonchi. No distress.  Abdomen:  +BS, soft, and non-distended. Mild chronic TTP in LUQ and LLQ that becomes more tender in the flank/left back without change since last visit. No rebound or guarding. No HSM or masses noted. Msk:  Symmetrical without gross deformities. Normal posture. Extremities:  Without edema. Neurologic:  Alert and  oriented x4 Psych:  Normal mood and affect.    Assessment:  65 year old female with history of GERD, dysphagia, gastroparesis, adenomatous colon polyps, thrombocytopenia following with hematology presenting today for follow-up of GERD, dysphagia s/p EGD. Also discussed gastroparesis and lower abdominal pain.  Dysphagia: EGD 01/31/2021 with normal-appearing esophagus.  Unable to perform empiric dilation due to large amount of residual food in the stomach. Unfortunately, she did not follow instructions for clear liquids after 6pm day prior to procedure and has gastroparesis. She continues with solid food dysphagia about once every other week which is about the same.  Foods will go down with drinking liquids. We will reschedule EGD and she will follow a clear liquid diet for 24 hrs prior to her procedure.   GERD: Well-controlled on Dexilant 60 mg daily.  Gastroparesis: Fairly asymptomatic.  Occasional early satiety, but nothing significant.  No nausea or vomiting.  She was counseled on gastroparesis diet today.  Lower abdominal pain: Several month history of intermittent lower abdominal pain occurring about twice a week that can be prior to bowel movements and/or prior to urination.  Notes improvement sometimes after bowel movements and/or after urinating.  Denies dysuria, constipation,  diarrhea, BRBPR, melena.  CT in April 2022 with no significant abdominal or pelvic findings, and she has not had any significant change in symptoms since then.  She did have a urine culture in April that had many species with recommendations for recollection.  She was to follow with PCP on this, but has not.  Suspect she may have a component of IBS.  Less likely UTI, but will recheck UA and urine culture.  She does not want any prescriptive medications to help with abdominal pain at this time.  Recommended trial of IBgard.  Samples provided.  Plan:  1.  Proceed with repeat EGD with esophageal dilation with propofol with Dr. Abbey Chatters in the near future. The risks, benefits, and alternatives have been discussed with the patient in detail. The patient states understanding and desires to proceed.  ASA II Due to retained food contents on last EGD and history of gastroparesis, will have patient follow clear liquid diet 24 hours prior to procedure. She was separate given instructions for diabetes medication adjustments.  2.  Continue Dexilant 60 mg daily.  3.  Counseled on gastroparesis diet.  Written instructions provided.  4.  Complete UA and urine culture.  5.  Trial IBgard to help with intermittent lower abdominal pain associated with bowel movements.  6.  Follow-up after EGD.    Aliene Altes, PA-C [facility] 04/21/2021

## 2021-04-21 ENCOUNTER — Other Ambulatory Visit: Payer: Self-pay

## 2021-04-21 ENCOUNTER — Encounter: Payer: Self-pay | Admitting: Gastroenterology

## 2021-04-21 ENCOUNTER — Ambulatory Visit (INDEPENDENT_AMBULATORY_CARE_PROVIDER_SITE_OTHER): Payer: Medicare Other | Admitting: Gastroenterology

## 2021-04-21 VITALS — BP 115/75 | HR 75 | Temp 97.3°F | Ht 61.0 in | Wt 195.8 lb

## 2021-04-21 DIAGNOSIS — K3184 Gastroparesis: Secondary | ICD-10-CM | POA: Diagnosis not present

## 2021-04-21 DIAGNOSIS — K219 Gastro-esophageal reflux disease without esophagitis: Secondary | ICD-10-CM | POA: Diagnosis not present

## 2021-04-21 DIAGNOSIS — R1030 Lower abdominal pain, unspecified: Secondary | ICD-10-CM | POA: Diagnosis not present

## 2021-04-21 DIAGNOSIS — R1319 Other dysphagia: Secondary | ICD-10-CM | POA: Diagnosis not present

## 2021-04-21 NOTE — Patient Instructions (Addendum)
Have your urine checked at [facility].  We have placed lab orders for this.  We will arrange we have an upper endoscopy with dilation of your esophagus in the near future with Dr. Abbey Chatters. *You need to follow a clear liquid diet 24 hours prior to your procedure.  We will give you separate instructions on this.  - While on clear liquids, take one half dose of diabetes medications: 5 mg of Farxiga, 5 mg of glipizide, 1 tablet of Janumet in the morning and hold evening dose. - No diabetes medications the morning of your procedure.   *Monitor your blood sugars closely while you are on clear liquids and correct any low blood sugars with sugary clear liquids.  Continue Dexilant 60 mg daily.  Follow a gastroparesis diet: Eat 4-6 small meals daily. Low-fat/low fat diet. Limit raw fruits and vegetables. See separate handout.  You may try using IBgard to help with intermittent lower abdominal discomfort.  We will follow-up with you after your procedure.   Aliene Altes, PA-C [facility]

## 2021-04-26 DIAGNOSIS — L84 Corns and callosities: Secondary | ICD-10-CM | POA: Diagnosis not present

## 2021-04-26 DIAGNOSIS — B351 Tinea unguium: Secondary | ICD-10-CM | POA: Diagnosis not present

## 2021-04-26 DIAGNOSIS — E1149 Type 2 diabetes mellitus with other diabetic neurological complication: Secondary | ICD-10-CM | POA: Diagnosis not present

## 2021-04-26 DIAGNOSIS — L602 Onychogryphosis: Secondary | ICD-10-CM | POA: Diagnosis not present

## 2021-04-26 DIAGNOSIS — M79674 Pain in right toe(s): Secondary | ICD-10-CM | POA: Diagnosis not present

## 2021-04-26 DIAGNOSIS — M79675 Pain in left toe(s): Secondary | ICD-10-CM | POA: Diagnosis not present

## 2021-04-26 DIAGNOSIS — I70223 Atherosclerosis of native arteries of extremities with rest pain, bilateral legs: Secondary | ICD-10-CM | POA: Diagnosis not present

## 2021-04-26 DIAGNOSIS — L600 Ingrowing nail: Secondary | ICD-10-CM | POA: Diagnosis not present

## 2021-04-27 ENCOUNTER — Other Ambulatory Visit: Payer: Self-pay | Admitting: Gastroenterology

## 2021-05-02 DIAGNOSIS — I129 Hypertensive chronic kidney disease with stage 1 through stage 4 chronic kidney disease, or unspecified chronic kidney disease: Secondary | ICD-10-CM | POA: Diagnosis not present

## 2021-05-02 DIAGNOSIS — D631 Anemia in chronic kidney disease: Secondary | ICD-10-CM | POA: Diagnosis not present

## 2021-05-02 DIAGNOSIS — N1832 Chronic kidney disease, stage 3b: Secondary | ICD-10-CM | POA: Diagnosis not present

## 2021-05-02 DIAGNOSIS — E559 Vitamin D deficiency, unspecified: Secondary | ICD-10-CM | POA: Diagnosis not present

## 2021-05-02 DIAGNOSIS — E1122 Type 2 diabetes mellitus with diabetic chronic kidney disease: Secondary | ICD-10-CM | POA: Diagnosis not present

## 2021-05-03 ENCOUNTER — Other Ambulatory Visit (HOSPITAL_COMMUNITY)
Admission: RE | Admit: 2021-05-03 | Discharge: 2021-05-03 | Disposition: A | Discharge status: Home / Self Care | Payer: Medicare Other | Source: Ambulatory Visit | Attending: Gastroenterology | Admitting: Gastroenterology

## 2021-05-03 DIAGNOSIS — R1030 Lower abdominal pain, unspecified: Secondary | ICD-10-CM | POA: Insufficient documentation

## 2021-05-03 LAB — URINALYSIS, COMPLETE (UACMP) WITH MICROSCOPIC
Bacteria, UA: NONE SEEN
Bilirubin Urine: NEGATIVE
Glucose, UA: 150 mg/dL — AB
Hgb urine dipstick: NEGATIVE
Ketones, ur: NEGATIVE mg/dL
Leukocytes,Ua: NEGATIVE
Nitrite: NEGATIVE
Protein, ur: NEGATIVE mg/dL
Specific Gravity, Urine: 1.026 (ref 1.005–1.030)
pH: 5 (ref 5.0–8.0)

## 2021-05-05 LAB — URINE CULTURE: Culture: <10000 — AB

## 2021-05-11 ENCOUNTER — Other Ambulatory Visit: Payer: Self-pay

## 2021-05-11 ENCOUNTER — Encounter: Payer: Self-pay | Admitting: Podiatry

## 2021-05-11 ENCOUNTER — Ambulatory Visit (INDEPENDENT_AMBULATORY_CARE_PROVIDER_SITE_OTHER): Payer: Medicare Other | Admitting: Podiatry

## 2021-05-11 DIAGNOSIS — D696 Thrombocytopenia, unspecified: Secondary | ICD-10-CM | POA: Diagnosis not present

## 2021-05-11 DIAGNOSIS — L603 Nail dystrophy: Secondary | ICD-10-CM

## 2021-05-11 DIAGNOSIS — E119 Type 2 diabetes mellitus without complications: Secondary | ICD-10-CM | POA: Diagnosis not present

## 2021-05-11 NOTE — Progress Notes (Signed)
This patient returns to my office for at risk foot care.  This patient requires this care by a professional since this patient will be at risk due to having diabetes and thrombocytopenia.  She says she had nail surgery performed on her right big toenail and now has a nail growing out of the inside border right big toenail.  She is also interested in diabetic shoes.   This patient presents for at risk foot care today.  General Appearance  Alert, conversant and in no acute stress.  Vascular  Dorsalis pedis and posterior tibial pulses are weakly palpable  bilaterally.  Capillary return is within normal limits  bilaterally. Temperature is within normal limits  bilaterally.  Neurologic  Senn-Weinstein monofilament wire test diminished  bilaterally. Muscle power within normal limits bilaterally.  Nails Thick disfigured discolored nails with subungual debris  from hallux to fifth toes bilaterally. No evidence of bacterial infection or drainage bilaterally.  Orthopedic  No limitations of motion  feet .  No crepitus or effusions noted.  HAV  B/L.  Hammer toes  second  B/L.  Skin  normotropic skin with no porokeratosis noted bilaterally.  No signs of infections or ulcers noted.     Diabetic neuropathy.  HAV  B/L.  Consent was obtained for treatment procedures.   Removal of nail spicule right hallux.  Patient qualifies for diabetic shoes due to DPN and HAV  B/L.     Return office visit                     Told patient to return for periodic foot care and evaluation due to potential at risk complications.   Gardiner Barefoot DPM

## 2021-05-12 ENCOUNTER — Inpatient Hospital Stay (HOSPITAL_COMMUNITY): Payer: Medicare Other | Attending: Hematology

## 2021-05-12 DIAGNOSIS — D696 Thrombocytopenia, unspecified: Secondary | ICD-10-CM | POA: Diagnosis not present

## 2021-05-12 DIAGNOSIS — D649 Anemia, unspecified: Secondary | ICD-10-CM | POA: Insufficient documentation

## 2021-05-12 DIAGNOSIS — I10 Essential (primary) hypertension: Secondary | ICD-10-CM | POA: Diagnosis not present

## 2021-05-12 LAB — CBC WITH DIFFERENTIAL/PLATELET
Abs Immature Granulocytes: 0.03 10*3/uL (ref 0.00–0.07)
Basophils Absolute: 0.1 10*3/uL (ref 0.0–0.1)
Basophils Relative: 1 %
Eosinophils Absolute: 0.3 10*3/uL (ref 0.0–0.5)
Eosinophils Relative: 5 %
HCT: 42.2 % (ref 36.0–46.0)
Hemoglobin: 13.1 g/dL (ref 12.0–15.0)
Immature Granulocytes: 0 %
Lymphocytes Relative: 23 %
Lymphs Abs: 1.7 10*3/uL (ref 0.7–4.0)
MCH: 27.1 pg (ref 26.0–34.0)
MCHC: 31 g/dL (ref 30.0–36.0)
MCV: 87.2 fL (ref 80.0–100.0)
Monocytes Absolute: 0.5 10*3/uL (ref 0.1–1.0)
Monocytes Relative: 8 %
Neutro Abs: 4.6 10*3/uL (ref 1.7–7.7)
Neutrophils Relative %: 63 %
Platelets: 106 10*3/uL — ABNORMAL LOW (ref 150–400)
RBC: 4.84 MIL/uL (ref 3.87–5.11)
RDW: 13.5 % (ref 11.5–15.5)
WBC: 7.2 10*3/uL (ref 4.0–10.5)
nRBC: 0 % (ref 0.0–0.2)

## 2021-05-12 LAB — COMPREHENSIVE METABOLIC PANEL
ALT: 21 U/L (ref 0–44)
AST: 32 U/L (ref 15–41)
Albumin: 4 g/dL (ref 3.5–5.0)
Alkaline Phosphatase: 71 U/L (ref 38–126)
Anion gap: 9 (ref 5–15)
BUN: 33 mg/dL — ABNORMAL HIGH (ref 8–23)
CO2: 26 mmol/L (ref 22–32)
Calcium: 9.9 mg/dL (ref 8.9–10.3)
Chloride: 101 mmol/L (ref 98–111)
Creatinine, Ser: 1.61 mg/dL — ABNORMAL HIGH (ref 0.44–1.00)
GFR, Estimated: 36 mL/min — ABNORMAL LOW (ref >60)
Glucose, Bld: 145 mg/dL — ABNORMAL HIGH (ref 70–99)
Potassium: 4.2 mmol/L (ref 3.5–5.1)
Sodium: 136 mmol/L (ref 135–145)
Total Bilirubin: 0.5 mg/dL (ref 0.3–1.2)
Total Protein: 7.5 g/dL (ref 6.5–8.1)

## 2021-05-12 LAB — IRON AND TIBC
Iron: 42 ug/dL (ref 28–170)
Saturation Ratios: 9 % — ABNORMAL LOW (ref 10.4–31.8)
TIBC: 477 ug/dL — ABNORMAL HIGH (ref 250–450)
UIBC: 435 ug/dL

## 2021-05-12 LAB — VITAMIN B12: Vitamin B-12: 907 pg/mL (ref 180–914)

## 2021-05-12 LAB — LACTATE DEHYDROGENASE: LDH: 149 U/L (ref 98–192)

## 2021-05-12 LAB — FOLATE: Folate: 13 ng/mL (ref >5.9)

## 2021-05-12 LAB — FERRITIN: Ferritin: 24 ng/mL (ref 11–307)

## 2021-05-17 ENCOUNTER — Encounter (HOSPITAL_COMMUNITY)
Admission: RE | Disposition: A | Discharge status: Home / Self Care | Payer: Self-pay | Source: Home / Self Care | Attending: Internal Medicine

## 2021-05-17 ENCOUNTER — Ambulatory Visit (HOSPITAL_COMMUNITY): Payer: Medicare Other | Admitting: Anesthesiology

## 2021-05-17 ENCOUNTER — Other Ambulatory Visit: Payer: Self-pay

## 2021-05-17 ENCOUNTER — Encounter (HOSPITAL_COMMUNITY): Payer: Self-pay

## 2021-05-17 ENCOUNTER — Ambulatory Visit (HOSPITAL_COMMUNITY)
Admission: RE | Admit: 2021-05-17 | Discharge: 2021-05-17 | Disposition: A | Discharge status: Home / Self Care | Payer: Medicare Other | Attending: Internal Medicine | Admitting: Internal Medicine

## 2021-05-17 DIAGNOSIS — K219 Gastro-esophageal reflux disease without esophagitis: Secondary | ICD-10-CM | POA: Diagnosis not present

## 2021-05-17 DIAGNOSIS — Z888 Allergy status to other drugs, medicaments and biological substances status: Secondary | ICD-10-CM | POA: Insufficient documentation

## 2021-05-17 DIAGNOSIS — Z86010 Personal history of colonic polyps: Secondary | ICD-10-CM | POA: Diagnosis not present

## 2021-05-17 DIAGNOSIS — Z7984 Long term (current) use of oral hypoglycemic drugs: Secondary | ICD-10-CM | POA: Insufficient documentation

## 2021-05-17 DIAGNOSIS — Z8719 Personal history of other diseases of the digestive system: Secondary | ICD-10-CM | POA: Diagnosis not present

## 2021-05-17 DIAGNOSIS — Z8379 Family history of other diseases of the digestive system: Secondary | ICD-10-CM | POA: Insufficient documentation

## 2021-05-17 DIAGNOSIS — E1143 Type 2 diabetes mellitus with diabetic autonomic (poly)neuropathy: Secondary | ICD-10-CM | POA: Diagnosis not present

## 2021-05-17 DIAGNOSIS — Z79899 Other long term (current) drug therapy: Secondary | ICD-10-CM | POA: Insufficient documentation

## 2021-05-17 DIAGNOSIS — M3119 Other thrombotic microangiopathy: Secondary | ICD-10-CM | POA: Insufficient documentation

## 2021-05-17 DIAGNOSIS — K3184 Gastroparesis: Secondary | ICD-10-CM | POA: Insufficient documentation

## 2021-05-17 DIAGNOSIS — Z833 Family history of diabetes mellitus: Secondary | ICD-10-CM | POA: Insufficient documentation

## 2021-05-17 DIAGNOSIS — R1310 Dysphagia, unspecified: Secondary | ICD-10-CM | POA: Diagnosis not present

## 2021-05-17 DIAGNOSIS — K449 Diaphragmatic hernia without obstruction or gangrene: Secondary | ICD-10-CM | POA: Insufficient documentation

## 2021-05-17 DIAGNOSIS — F419 Anxiety disorder, unspecified: Secondary | ICD-10-CM | POA: Diagnosis not present

## 2021-05-17 HISTORY — PX: BALLOON DILATION: SHX5330

## 2021-05-17 HISTORY — PX: ESOPHAGOGASTRODUODENOSCOPY (EGD) WITH PROPOFOL: SHX5813

## 2021-05-17 LAB — GLUCOSE, CAPILLARY
Glucose-Capillary: 72 mg/dL (ref 70–99)
Glucose-Capillary: 109 mg/dL — ABNORMAL HIGH (ref 70–99)

## 2021-05-17 SURGERY — ESOPHAGOGASTRODUODENOSCOPY (EGD) WITH PROPOFOL
Anesthesia: General

## 2021-05-17 MED ORDER — DEXTROSE 50 % IV SOLN
25.0000 mL | Freq: Once | INTRAVENOUS | Status: AC
  Administered 2021-05-17: 25 mL via INTRAVENOUS

## 2021-05-17 MED ORDER — METOCLOPRAMIDE HCL 5 MG/ML IJ SOLN
INTRAMUSCULAR | Status: AC
  Filled 2021-05-17: qty 2

## 2021-05-17 MED ORDER — STERILE WATER FOR IRRIGATION IR SOLN
Status: DC | PRN
  Administered 2021-05-17: 200 mL

## 2021-05-17 MED ORDER — LIDOCAINE HCL (CARDIAC) PF 50 MG/5ML IV SOSY
PREFILLED_SYRINGE | INTRAVENOUS | Status: DC | PRN
  Administered 2021-05-17: 50 mg via INTRAVENOUS

## 2021-05-17 MED ORDER — PROPOFOL 10 MG/ML IV BOLUS
INTRAVENOUS | Status: DC | PRN
  Administered 2021-05-17 (×2): 100 mg via INTRAVENOUS

## 2021-05-17 MED ORDER — DEXTROSE 50 % IV SOLN
INTRAVENOUS | Status: AC
  Filled 2021-05-17: qty 50

## 2021-05-17 MED ORDER — METOCLOPRAMIDE HCL 5 MG/ML IJ SOLN
10.0000 mg | Freq: Once | INTRAMUSCULAR | Status: AC
  Administered 2021-05-17: 10 mg via INTRAVENOUS

## 2021-05-17 MED ORDER — LACTATED RINGERS IV SOLN: INTRAVENOUS | Status: DC

## 2021-05-17 NOTE — Interval H&P Note (Signed)
History and Physical Interval Note:  05/17/2021 7:31 AM  Barbara House  has presented today for surgery, with the diagnosis of dysphagia, GERD, gastroparesis.  The various methods of treatment have been discussed with the patient and family. After consideration of risks, benefits and other options for treatment, the patient has consented to  Procedure(s) with comments: ESOPHAGOGASTRODUODENOSCOPY (EGD) WITH PROPOFOL (N/A) - 8:00am BALLOON DILATION (N/A) as a surgical intervention.  The patient's history has been reviewed, patient examined, no change in status, stable for surgery.  I have reviewed the patient's chart and labs.  Questions were answered to the patient's satisfaction.     [name]

## 2021-05-17 NOTE — Transfer of Care (Signed)
Immediate Anesthesia Transfer of Care Note  Patient: Barbara House  Procedure(s) Performed: ESOPHAGOGASTRODUODENOSCOPY (EGD) WITH PROPOFOL BALLOON DILATION  Patient Location: Endoscopy Unit  Anesthesia Type:General  Level of Consciousness: awake and patient cooperative  Airway & Oxygen Therapy: Patient Spontanous Breathing  Post-op Assessment: Report given to RN and Post -op Vital signs reviewed and stable  Post vital signs: Reviewed and stable  Last Vitals:  Vitals Value Taken Time  BP    Temp    Pulse    Resp    SpO2      Last Pain:  Vitals:   05/17/21 0724  TempSrc: Oral         Complications: No notable events documented.

## 2021-05-17 NOTE — Anesthesia Preprocedure Evaluation (Addendum)
Anesthesia Evaluation  Patient identified by MRN, date of birth, ID band Patient awake    Reviewed: Allergy & Precautions, NPO status , Patient's Chart, lab work & pertinent test results  Airway Mallampati: III  TM Distance: >3 FB Neck ROM: Full    Dental  (+) Dental Advisory Given, Teeth Intact   Pulmonary shortness of breath and with exertion,    Pulmonary exam normal breath sounds clear to auscultation       Cardiovascular METShypertension, Pt. on medications + angina with exertion Normal cardiovascular exam Rhythm:Regular Rate:Normal     Neuro/Psych  Headaches, PSYCHIATRIC DISORDERS Anxiety    GI/Hepatic GERD (gastroparesis)  Medicated and Poorly Controlled,  Endo/Other  diabetes, Well Controlled, Type 2, Oral Hypoglycemic Agents  Renal/GU Renal InsufficiencyRenal disease     Musculoskeletal  (+) Arthritis , Osteoarthritis,    Abdominal   Peds  Hematology negative hematology ROS (+)   Anesthesia Other Findings Dizziness   Reproductive/Obstetrics                           Anesthesia Physical Anesthesia Plan  ASA: 3  Anesthesia Plan: General   Post-op Pain Management:    Induction: Intravenous  PONV Risk Score and Plan: Propofol infusion  Airway Management Planned: Nasal Cannula and Natural Airway  Additional Equipment:   Intra-op Plan:   Post-operative Plan: Possible Post-op intubation/ventilation  Informed Consent: I have reviewed the patients History and Physical, chart, labs and discussed the procedure including the risks, benefits and alternatives for the proposed anesthesia with the patient or authorized representative who has indicated his/her understanding and acceptance.     Dental advisory given  Plan Discussed with: CRNA and Surgeon  Anesthesia Plan Comments: (Possible GA with ETT was explained)       Anesthesia Quick Evaluation

## 2021-05-17 NOTE — Op Note (Signed)
[facility] Patient Name: Barbara House Procedure Date: 05/17/2021 8:16 AM MRN: [id number] Date of Birth: [birth year] Attending MD: Elon Alas. Abbey Chatters DO CSN: [id number] Age: 65 Admit Type: Outpatient Procedure:                Upper GI endoscopy Indications:              Dysphagia Providers:                Elon Alas. Abbey Chatters, DO, Lurline Del, RN, Kristine L.                            Risa Grill, Technician Referring MD:              Medicines:                See the Anesthesia note for documentation of the                            administered medications Complications:            No immediate complications. Estimated Blood Loss:     Estimated blood loss: none. Procedure:                Pre-Anesthesia Assessment:                           - The anesthesia plan was to use monitored                            anesthesia care (MAC).                           After obtaining informed consent, the endoscope was                            passed under direct vision. Throughout the                            procedure, the patient's blood pressure, pulse, and                            oxygen saturations were monitored continuously. The                            [id number]-H190 ([id number]) scope was introduced through the                            mouth, and advanced to the second part of duodenum.                            The upper GI endoscopy was accomplished without                            difficulty. The patient tolerated the procedure                            well. Scope In: 8:30:28 AM Scope  Out: 8:35:55 AM Total Procedure Duration: 0 hours 5 minutes 27 seconds  Findings:      There is no endoscopic evidence of bleeding, areas of erosion,       esophagitis, ulcerations or varices in the entire esophagus.      No endoscopic abnormality was evident in the esophagus to explain the       patient's complaint of dysphagia. Preparations were made for empiric       dilation. A TTS  dilator was passed through the scope. Dilation with an       18-19-20 mm balloon dilator was performed to 20 mm. Dilation was       performed with a mild resistance at 20 mm DUE TO POSSIBLE DISTAL       ESOPHAGEAL WEB. Estimated blood loss was none.      The entire examined stomach was normal.      The duodenal bulb, first portion of the duodenum and second portion of       the duodenum were normal.      A small hiatal hernia was present. Impression:               - Normal stomach.                           - Normal duodenal bulb, first portion of the                            duodenum and second portion of the duodenum.                           - Small hiatal hernia.                           - No specimens collected. Moderate Sedation:      Per Anesthesia Care Recommendation:           - Patient has a contact number available for                            emergencies. The signs and symptoms of potential                            delayed complications were discussed with the                            patient. Return to normal activities tomorrow.                            Written discharge instructions were provided to the                            patient.                           - Resume previous diet.                           - Continue present medications.                           -  Use Dexilant (dexlansoprazole) 60 mg PO daily.                           - No ibuprofen, naproxen, or other non-steroidal                            anti-inflammatory drugs.                           - Return to GI clinic in 6 months. Procedure Code(s):        --- Professional ---                           [phone], Esophagogastroduodenoscopy, flexible,                            transoral; diagnostic, including collection of                            specimen(s) by brushing or washing, when performed                            (separate procedure) Diagnosis Code(s):        --- Professional  ---                           R13.10, Dysphagia, unspecified CPT copyright 2019 American Medical Association. All rights reserved. The codes documented in this report are preliminary and upon coder review may  be revised to meet current compliance requirements. Elon Alas. Abbey Chatters, DO [place] Abbey Chatters, DO 05/17/2021 8:38:53 AM This report has been signed electronically. Number of Addenda: 0

## 2021-05-17 NOTE — Discharge Instructions (Addendum)
EGD Discharge instructions Please read the instructions outlined below and refer to this sheet in the next few weeks. These discharge instructions provide you with general information on caring for yourself after you leave the hospital. Your doctor may also give you specific instructions. While your treatment has been planned according to the most current medical practices available, unavoidable complications occasionally occur. If you have any problems or questions after discharge, please call your doctor. ACTIVITY You may resume your regular activity but move at a slower pace for the next 24 hours.  Take frequent rest periods for the next 24 hours.  Walking will help expel (get rid of) the air and reduce the bloated feeling in your abdomen.  No driving for 24 hours (because of the anesthesia (medicine) used during the test).  You may shower.  Do not sign any important legal documents or operate any machinery for 24 hours (because of the anesthesia used during the test).  NUTRITION Drink plenty of fluids.  You may resume your normal diet.  Begin with a light meal and progress to your normal diet.  Avoid alcoholic beverages for 24 hours or as instructed by your caregiver.  MEDICATIONS You may resume your normal medications unless your caregiver tells you otherwise.  WHAT YOU CAN EXPECT TODAY You may experience abdominal discomfort such as a feeling of fullness or "gas" pains.  FOLLOW-UP Your doctor will discuss the results of your test with you.  SEEK IMMEDIATE MEDICAL ATTENTION IF ANY OF THE FOLLOWING OCCUR: Excessive nausea (feeling sick to your stomach) and/or vomiting.  Severe abdominal pain and distention (swelling).  Trouble swallowing.  Temperature over 101 F (37.8 C).  Rectal bleeding or vomiting of blood.    Your stomach was empty on today's exam.  You did have a slight narrowing of your esophagus so I stretched this with the balloon.  Hopefully this helps with your  swallowing.  Continue on Dexilant daily.  Avoid NSAIDs.  Follow-up with GI in 6 months.  I hope you have a great rest of your week!  Barbara House. Abbey Chatters, D.O. Gastroenterology and Hepatology [facility]

## 2021-05-17 NOTE — Anesthesia Postprocedure Evaluation (Signed)
Anesthesia Post Note  Patient: Barbara House  Procedure(s) Performed: ESOPHAGOGASTRODUODENOSCOPY (EGD) WITH PROPOFOL BALLOON DILATION  Patient location during evaluation: Endoscopy Anesthesia Type: General Level of consciousness: awake and alert and oriented Pain management: pain level controlled Vital Signs Assessment: post-procedure vital signs reviewed and stable Respiratory status: spontaneous breathing and respiratory function stable Cardiovascular status: blood pressure returned to baseline and stable Postop Assessment: no apparent nausea or vomiting Anesthetic complications: no   No notable events documented.   Last Vitals:  Vitals:   05/17/21 0724 05/17/21 0842  BP:  114/62  Pulse: 70   Resp: (!) 22 16  Temp: 36.7 C (!) 36.4 C  SpO2: 98% 96%    Last Pain:  Vitals:   05/17/21 0842  TempSrc: Oral  PainSc: 0-No pain                 Jayonna Meyering C Ly Wass

## 2021-05-17 NOTE — Anesthesia Procedure Notes (Signed)
Date/Time: 05/17/2021 8:24 AM Performed by: Vista Deck, CRNA Pre-anesthesia Checklist: Patient identified, Emergency Drugs available, Suction available, Timeout performed and Patient being monitored Patient Re-evaluated:Patient Re-evaluated prior to induction Oxygen Delivery Method: Nasal Cannula

## 2021-05-18 NOTE — Progress Notes (Signed)
[place], [place] [address]   CLINIC:  Medical Oncology/Hematology  PCP:  Caren Macadam, [place] [address] [phone]   REASON FOR VISIT:  Follow-up for thrombocytopenia  CURRENT THERAPY: Surveillance  INTERVAL HISTORY:  [name] 65 y.o. female returns for routine follow-up of thrombocytopenia and previous history of normocytic anemia.  She was last seen by Tarri Abernethy PA-C on 01/17/2021.  At today's visit, she reports feeling fairly well.  No recent hospitalizations, surgeries, or changes in baseline health status.  She admits to easy bruising, but denies petechial rash.  No signs of blood loss such as [employer] taxis, hematemesis, hematochezia, or melena.  She reports mild fatigue with energy at 75%.  She denies any chest pain, dyspnea on exertion, palpitations, syncopal episodes.  No B symptoms such as fever, chills, night sweats, unintentional weight loss.  She is compliant with taking her vitamin B12 and vitamin D3 supplements at home.  She has 75% energy and 100% appetite. She endorses that she is maintaining a stable weight.    REVIEW OF SYSTEMS:  Review of Systems  Constitutional:  Positive for fatigue. Negative for appetite change, chills, diaphoresis, fever and unexpected weight change.  HENT:   Positive for trouble swallowing. Negative for lump/mass and nosebleeds.   Eyes:  Negative for eye problems.  Respiratory:  Negative for cough, hemoptysis and shortness of breath.   Cardiovascular:  Negative for chest pain, leg swelling and palpitations.  Gastrointestinal:  Negative for abdominal pain, blood in stool, constipation, diarrhea, nausea and vomiting.  Genitourinary:  Negative for hematuria.   Skin: Negative.   Neurological:  Positive for dizziness and headaches. Negative for light-headedness.  Hematological:  Does not bruise/bleed easily.  Psychiatric/Behavioral:  Positive for sleep  disturbance.      PAST MEDICAL/SURGICAL HISTORY:  Past Medical History:  Diagnosis Date   Allergic rhinitis    Anxiety disorder    Arthritis    Gastroparesis    GERD (gastroesophageal reflux disease)    Headache    History of migraine    Hx of adenomatous colonic polyps    Hypertension    Thrombocytopenia ([place])    Type 2 diabetes mellitus ([place])    Not sure when she was diagnosed, greater than five years.    Past Surgical History:  Procedure Laterality Date   ABDOMINAL HYSTERECTOMY     has ovaries.    BIOPSY  01/31/2021   Procedure: BIOPSY;  Surgeon: [name], DO;  [facility];  Service: Endoscopy;;   CARDIAC CATHETERIZATION N/A 08/03/2016   Procedure: Left Heart Cath and Coronary Angiography;  Surgeon: Peter M Martinique, MD;  Location: [facility];  Service: Cardiovascular;  Laterality: N/A;   COLONOSCOPY WITH PROPOFOL N/A 11/05/2018   Dr. Oneida Alar: int/ext hemorrhoids, 50m cecal tubular adenoma removed, next TCS in 5-10 years   ESOPHAGOGASTRODUODENOSCOPY (EGD) WITH PROPOFOL N/A 11/05/2018   Dr. FOneida Alar esophagus was dilated, mild gastritis with benign bx   ESOPHAGOGASTRODUODENOSCOPY (EGD) WITH PROPOFOL N/A 01/31/2021   Surgeon: CEloise Harman DO;  Normal esophagus, large amount of food residue in the stomach, gastritis biopsied (mild chronic gastritis, negative for H. pylori), normal examined duodenum s/p biopsy (benign).   Miinor skin surgery     POLYPECTOMY  11/05/2018   Procedure: POLYPECTOMY;  Surgeon: FDanie Binder MD;  [facility];  Service: Endoscopy;;  cecal polyp   SAVORY DILATION N/A 11/05/2018   Procedure:  SAVORY DILATION;  Surgeon: Danie Binder, MD;  [facility];  Service: Endoscopy;  Laterality: N/A;     SOCIAL HISTORY:  Social History   Socioeconomic History   Marital status: Single    Spouse name: Not on file   Number of children: 1   Years of education: Not on file   Highest education level: Not  on file  Occupational History   Not on file  Tobacco Use   Smoking status: Never   Smokeless tobacco: Never  Vaping Use   Vaping Use: Never used  Substance and Sexual Activity   Alcohol use: No    Alcohol/week: 0.0 standard drinks   Drug use: No   Sexual activity: Not Currently    Birth control/protection: Surgical  Other Topics Concern   Not on file  Social History Narrative   Reports is on disability. Has one son. Lives in [place]. Enjoys time with family. Enjoys Firefighter. Attends church.    Social Determinants of Health   Financial Resource Strain: Low Risk    Difficulty of Paying Living Expenses: Not very hard  Food Insecurity: No Food Insecurity   Worried About Charity fundraiser in the Last Year: Never true   Ran Out of Food in the Last Year: Never true  Transportation Needs: No Transportation Needs   Lack of Transportation (Medical): No   Lack of Transportation (Non-Medical): No  Physical Activity: Insufficiently Active   Days of Exercise per Week: 3 days   Minutes of Exercise per Session: 20 min  Stress: Stress Concern Present   Feeling of Stress : To some extent  Social Connections: Moderately Isolated   Frequency of Communication with Friends and Family: More than three times a week   Frequency of Social Gatherings with Friends and Family: More than three times a week   Attends Religious Services: More than 4 times per year   Active Member of [employer] or Organizations: No   Attends Music therapist: Not on file   Marital Status: Never married  Human resources officer Violence: Not At Risk   Fear of Current or Ex-Partner: No   Emotionally Abused: No   Physically Abused: No   Sexually Abused: No    FAMILY HISTORY:  Family History  Problem Relation Age of Onset   Hypertension Mother    Diabetes Mother    Hypertension Son    Diabetes Son    Cancer Father    Cirrhosis Brother    Alcohol abuse Brother    Colon cancer Neg Hx     CURRENT  MEDICATIONS:  Outpatient Encounter Medications as of 05/19/2021  Medication Sig   atorvastatin (LIPITOR) 20 MG tablet Take 20 mg by mouth in the morning.   chlorthalidone (HYGROTON) 25 MG tablet Take 25 mg by mouth every morning.   Cholecalciferol (VITAMIN D3) 125 MCG (5000 UT) CAPS Take 5,000 Units by mouth in the morning.   cycloSPORINE (RESTASIS) 0.05 % ophthalmic emulsion Place 1 drop into both eyes 2 (two) times daily as needed (dry eyes).   dexlansoprazole (DEXILANT) 60 MG capsule TAKE ONE CAPSULE BY MOUTH EVERY MORNING WITH breakfast. (Patient taking differently: Take 60 mg by mouth daily with breakfast.)   diclofenac Sodium (VOLTAREN) 1 % GEL Apply 2 g topically 4 (four) times daily. Apply to medial left elbow (Patient not taking: No sig reported)   FARXIGA 10 MG TABS tablet Take 10 mg by mouth in the morning.   Fluocinolone Acetonide 0.01 % OIL Place  1 application in ear(s) 2 (two) times daily as needed (ear itching).   GE100 BLOOD GLUCOSE TEST test strip CHECK BLOOD SUGAR ONCE DAILY OR AS DIRECTED BY PHYSICIAN   glipiZIDE (GLUCOTROL XL) 10 MG 24 hr tablet Take 10 mg by mouth every morning.   ipratropium (ATROVENT) 0.03 % nasal spray Place 2 sprays into both nostrils 3 (three) times daily as needed for rhinitis.   losartan (COZAAR) 100 MG tablet Take 100 mg by mouth in the morning.   montelukast (SINGULAIR) 10 MG tablet TAKE 1 TABLET (10 MG TOTAL) BY MOUTH AT BEDTIME AS NEEDED (FOR ALLERGIES.). (Patient taking differently: Take 10 mg by mouth in the morning.)   nystatin cream (MYCOSTATIN) Apply 1 application topically 2 (two) times daily as needed (vaginal itching).   PARoxetine (PAXIL) 40 MG tablet TAKE ONE TABLET BY MOUTH EVERY MORNING. (Patient taking differently: Take 40 mg by mouth in the morning.)   sitaGLIPtin-metformin (JANUMET) 50-1000 MG tablet Take 1 tablet by mouth 2 (two) times daily with a meal.   verapamil (CALAN-SR) 240 MG CR tablet TAKE ONE TABLET BY MOUTH EVERY EVENING.  (Patient taking differently: Take 240 mg by mouth in the morning.)   vitamin B-12 (CYANOCOBALAMIN) 500 MCG tablet Take 500 mcg by mouth in the morning.   No facility-administered encounter medications on file as of 05/19/2021.    ALLERGIES:  Allergies  Allergen Reactions   Prednisone Other (See Comments)    Hair loss     PHYSICAL EXAM:  ECOG PERFORMANCE STATUS: 1 - Symptomatic but completely ambulatory  There were no vitals filed for this visit. There were no vitals filed for this visit. Physical Exam Constitutional:      Appearance: Normal appearance. She is obese.  HENT:     Head: Normocephalic and atraumatic.     Mouth/Throat:     Mouth: Mucous membranes are moist.  Eyes:     Extraocular Movements: Extraocular movements intact.     Pupils: Pupils are equal, round, and reactive to light.  Cardiovascular:     Rate and Rhythm: Normal rate and regular rhythm.     Pulses: Normal pulses.     Heart sounds: Normal heart sounds.  Pulmonary:     Effort: Pulmonary effort is normal.     Breath sounds: Normal breath sounds.  Abdominal:     General: Bowel sounds are normal.     Palpations: Abdomen is soft.     Tenderness: There is no abdominal tenderness.  Musculoskeletal:        General: No swelling.     Right lower leg: No edema.     Left lower leg: No edema.  Lymphadenopathy:     Cervical: No cervical adenopathy.  Skin:    General: Skin is warm and dry.  Neurological:     General: No focal deficit present.     Mental Status: She is alert and oriented to person, place, and time.  Psychiatric:        Mood and Affect: Mood normal.        Behavior: Behavior normal.     LABORATORY DATA:  I have reviewed the labs as listed.  CBC    Component Value Date/Time   WBC 7.2 05/12/2021 0901   RBC 4.84 05/12/2021 0901   HGB 13.1 05/12/2021 0901   HGB 11.7 06/25/2018 1548   HCT 42.2 05/12/2021 0901   HCT 34.7 06/25/2018 1548   PLT 106 (L) 05/12/2021 0901   PLT 165  06/25/2018 1548  MCV 87.2 05/12/2021 0901   MCV 85 06/25/2018 1548   MCH 27.1 05/12/2021 0901   MCHC 31.0 05/12/2021 0901   RDW 13.5 05/12/2021 0901   RDW 13.5 06/25/2018 1548   LYMPHSABS 1.7 05/12/2021 0901   LYMPHSABS 2.4 06/25/2018 1548   MONOABS 0.5 05/12/2021 0901   EOSABS 0.3 05/12/2021 0901   EOSABS 0.3 06/25/2018 1548   BASOSABS 0.1 05/12/2021 0901   BASOSABS 0.0 06/25/2018 1548   CMP Latest Ref Rng & Units 05/12/2021 01/17/2021 01/17/2021  Glucose 70 - 99 mg/dL 145(H) - 153(H)  BUN 8 - 23 mg/dL 33(H) - 25(H)  Creatinine 0.44 - 1.00 mg/dL 1.61(H) 1.27(H) 1.26(H)  Sodium 135 - 145 mmol/L 136 - 140  Potassium 3.5 - 5.1 mmol/L 4.2 - 4.7  Chloride 98 - 111 mmol/L 101 - 102  CO2 22 - 32 mmol/L 26 - 26  Calcium 8.9 - 10.3 mg/dL 9.9 - 10.4(H)  Total Protein 6.5 - 8.1 g/dL 7.5 - 7.9  Total Bilirubin 0.3 - 1.2 mg/dL 0.5 - 0.5  Alkaline Phos 38 - 126 U/L 71 - 68  AST 15 - 41 U/L 32 - 28  ALT 0 - 44 U/L 21 - 23    DIAGNOSTIC IMAGING:  I have independently reviewed the relevant imaging and discussed with the patient.  ASSESSMENT & PLAN: 1.   Thrombocytopenia, mild to moderate - Initial work-up unable to determine cause of thrombocytopenia - SPEP was normal, hepatitis panel was normal, RF and ANA were negative, no overt nutritional deficiencies (normal B12, folate, methylmalonic acid, copper).  Previous testing of hepatitis C, HIV, and H. pylori were negative. - Pathologist smear review on 08/29/2019 showed mild thrombocytopenia, borderline anemia - Normal abdominal ultrasound from 08/15/2017 negative for liver abnormality, spleen was normal in size and appearance - Reported having one blood transfusion many years ago, but unsure of reason - Patient is taking vitamin B12 pills - Platelets have ranged from 80 to 109 since January 2020 - No major bleeding events, no B symptoms, no easy bruising - Differential diagnosis includes immune thrombocytopenia versus very early MDS  - She  admits to easy bruising, but denies any blood loss  - Most recent labs (05/12/2021): Platelets 106, at baseline - PLAN:  No intervention needed at this time, we will continue to monitor.  RTC in 4 months for repeat labs and follow-up.  Will consider bone marrow biopsy if any significant changes in CBC.  2.  Normocytic anemia (resolved) with iron deficiency state - Patient has had prior history of mild normocytic anemia with hemoglobin ranging from 11-12, possibly secondary to CKD and iron deficiency - EGD (05/17/2021): No evidence of bleeding, erosions, ulcerations, or varices - Colonoscopy (11/05/2018): Polyp x1, external and internal hemorrhoids - Currently taking daily B12 tablet daily - Symptomatic with mild fatigue - No signs or symptoms of blood loss, denies bright red blood per rectum, melena, nosebleeds - Most recent labs (05/12/2021): Normal Hgb 13.1, ferritin 24 with TIBC 477 and 9% iron saturation; B12 and folate normal - PLAN: Anemia has resolved, but patient has signs of iron deficiency.  We will start her on ferrous sulfate 325 mg daily. RTC in 4 months for repeat labs.  3.  Vitamin D deficiency - Hypovitaminosis D evidenced in August 2021 with vitamin D level 23.76 -  Took supplement for a few months and had improved levels (vitamin D level checked on 12/08/2020 was within normal limits at 32.25) - Currently taking Vitamin D3 5,000 units daily -  PLAN: Check vitamin D at next appointment.   PLAN SUMMARY & DISPOSITION: -Start taking ferrous sulfate daily - Labs and RTC in 4 months  All questions were answered. The patient knows to call the clinic with any problems, questions or concerns.  Medical decision making: Low  Time spent on visit: I spent 20 minutes counseling the patient face to face. The total time spent in the appointment was 30 minutes and more than 50% was on counseling.   Harriett Rush, PA-C  05/19/2021 11:38 AM

## 2021-05-19 ENCOUNTER — Inpatient Hospital Stay (HOSPITAL_COMMUNITY): Payer: Medicare Other | Attending: Physician Assistant | Admitting: Physician Assistant

## 2021-05-19 ENCOUNTER — Other Ambulatory Visit: Payer: Self-pay

## 2021-05-19 VITALS — BP 135/80 | HR 73 | Temp 96.6°F | Resp 18 | Wt 194.3 lb

## 2021-05-19 DIAGNOSIS — E538 Deficiency of other specified B group vitamins: Secondary | ICD-10-CM

## 2021-05-19 DIAGNOSIS — E559 Vitamin D deficiency, unspecified: Secondary | ICD-10-CM | POA: Diagnosis not present

## 2021-05-19 DIAGNOSIS — E119 Type 2 diabetes mellitus without complications: Secondary | ICD-10-CM | POA: Diagnosis not present

## 2021-05-19 DIAGNOSIS — D509 Iron deficiency anemia, unspecified: Secondary | ICD-10-CM

## 2021-05-19 DIAGNOSIS — I10 Essential (primary) hypertension: Secondary | ICD-10-CM | POA: Diagnosis not present

## 2021-05-19 DIAGNOSIS — D649 Anemia, unspecified: Secondary | ICD-10-CM | POA: Diagnosis not present

## 2021-05-19 DIAGNOSIS — Z7984 Long term (current) use of oral hypoglycemic drugs: Secondary | ICD-10-CM | POA: Insufficient documentation

## 2021-05-19 DIAGNOSIS — D696 Thrombocytopenia, unspecified: Secondary | ICD-10-CM | POA: Insufficient documentation

## 2021-05-19 DIAGNOSIS — Z79899 Other long term (current) drug therapy: Secondary | ICD-10-CM | POA: Diagnosis not present

## 2021-05-19 NOTE — Patient Instructions (Signed)
Day at [facility] Discharge Instructions  You were seen today by Tarri Abernethy PA-C for your low platelets and anemia (low red blood cells).  LOW PLATELETS: Your low platelets are stable at their baseline.  We will continue to watch these, but there is no treatment we need to give you for them right now.  ANEMIA ("low red blood cells"): Your anemia has resolved, and you now have normal red blood cells.  However, your iron level is a little bit low.  We will start you on daily iron pills, further instructions below.  LABS: Return in 4 months for repeat labs  OTHER TESTS: None at this time  MEDICATIONS: Start taking IRON (ferrous sulfate 325 mg) once daily.  This can sometimes cause some constipation, so if you notice constipation, take a stool softener (such as over-the-counter Colace).  If you continue to have severe constipation or upset stomach from the iron, you can stop the iron and we will check it at your next appointment.  FOLLOW-UP APPOINTMENT: Office visit in 4 months.   Thank you for choosing [place] at [facility] to provide your oncology and hematology care.  To afford each patient quality time with our provider, please arrive at least 15 minutes before your scheduled appointment time.   If you have a lab appointment with the [facility] please come in thru the Main Entrance and check in at the main information desk.  You need to re-schedule your appointment should you arrive 10 or more minutes late.  We strive to give you quality time with our providers, and arriving late affects you and other patients whose appointments are after yours.  Also, if you no show three or more times for appointments you may be dismissed from the clinic at the providers discretion.     Again, thank you for choosing [facility].  Our hope is that these requests will decrease the amount of time that you wait before being seen by  our physicians.       _____________________________________________________________  Should you have questions after your visit to [facility], please contact our office at [phone] and follow the prompts.  Our office hours are 8:00 a.m. and 4:30 p.m. Monday - Friday.  Please note that voicemails left after 4:00 p.m. may not be returned until the following business day.  We are closed weekends and major holidays.  You do have access to a nurse 24-7, just call the main number to the clinic [phone] and do not press any options, hold on the line and a nurse will answer the phone.    For prescription refill requests, have your pharmacy contact our office and allow 72 hours.    Due to Covid, you will need to wear a mask upon entering the hospital. If you do not have a mask, a mask will be given to you at the Main Entrance upon arrival. For doctor visits, patients may have 1 support person age 36 or older with them. For treatment visits, patients can not have anyone with them due to social distancing guidelines and our immunocompromised population.

## 2021-05-26 ENCOUNTER — Encounter (HOSPITAL_COMMUNITY): Payer: Self-pay | Admitting: Internal Medicine

## 2021-05-27 DIAGNOSIS — F419 Anxiety disorder, unspecified: Secondary | ICD-10-CM | POA: Diagnosis not present

## 2021-05-27 DIAGNOSIS — R911 Solitary pulmonary nodule: Secondary | ICD-10-CM | POA: Diagnosis not present

## 2021-05-27 DIAGNOSIS — E782 Mixed hyperlipidemia: Secondary | ICD-10-CM | POA: Diagnosis not present

## 2021-05-27 DIAGNOSIS — I10 Essential (primary) hypertension: Secondary | ICD-10-CM | POA: Diagnosis not present

## 2021-05-27 DIAGNOSIS — E559 Vitamin D deficiency, unspecified: Secondary | ICD-10-CM | POA: Diagnosis not present

## 2021-05-27 DIAGNOSIS — D696 Thrombocytopenia, unspecified: Secondary | ICD-10-CM | POA: Diagnosis not present

## 2021-05-27 DIAGNOSIS — E118 Type 2 diabetes mellitus with unspecified complications: Secondary | ICD-10-CM | POA: Diagnosis not present

## 2021-06-02 ENCOUNTER — Ambulatory Visit (INDEPENDENT_AMBULATORY_CARE_PROVIDER_SITE_OTHER): Payer: Medicare Other | Admitting: *Deleted

## 2021-06-02 ENCOUNTER — Other Ambulatory Visit: Payer: Self-pay

## 2021-06-02 DIAGNOSIS — M2042 Other hammer toe(s) (acquired), left foot: Secondary | ICD-10-CM

## 2021-06-02 DIAGNOSIS — E119 Type 2 diabetes mellitus without complications: Secondary | ICD-10-CM

## 2021-06-02 DIAGNOSIS — M2041 Other hammer toe(s) (acquired), right foot: Secondary | ICD-10-CM

## 2021-06-02 NOTE — Progress Notes (Signed)
Patient presents to the office today for diabetic shoe and insole measuring.  Patient was measured with brannock device to determine size and width for 1 pair of extra depth shoes and foam casted for 3 pair of insoles.   Documentation of medical necessity will be sent to patient's treating diabetic doctor to verify and sign.   Patient's diabetic provider: Dr. Caren Macadam  Shoes and insoles will be ordered at that time and patient will be notified for an appointment for fitting when they arrive.   Shoe size (per patient): 8.5 Medium   Brannock measurement: RIGHT/LEFT - 8.5 D  Patient shoe selection-   1st choice:   Orthofeet [id number]  2nd choice:  Apex [id number]  Shoe size ordered: Women's 8.5 Wide

## 2021-06-14 ENCOUNTER — Telehealth: Payer: Self-pay | Admitting: Orthopaedic Surgery

## 2021-06-14 NOTE — Telephone Encounter (Signed)
Patient called, relays she had gotten dizzy and "fell over the bed"; said not onto the floor - about 1 week and a half ago. States her neck has been hurting since. Discussed orthopaedic appointment, however, per office protocol, due to fall and due to symptom of dizziness prior to fall, relayed Emergency room for workup and evaluation, then to call if emergency provider advises orthopaedic appointment. Also relayed to patient to call her primary care provider, Dr Mannie Stabile, to further advise. Voiced understanding.

## 2021-06-15 DIAGNOSIS — I10 Essential (primary) hypertension: Secondary | ICD-10-CM | POA: Diagnosis not present

## 2021-06-17 ENCOUNTER — Other Ambulatory Visit: Payer: Self-pay

## 2021-06-17 ENCOUNTER — Emergency Department (HOSPITAL_COMMUNITY)
Admission: EM | Admit: 2021-06-17 | Discharge: 2021-06-17 | Disposition: A | Discharge status: Home / Self Care | Payer: Medicare Other | Attending: Emergency Medicine | Admitting: Emergency Medicine

## 2021-06-17 ENCOUNTER — Emergency Department (HOSPITAL_COMMUNITY): Payer: Medicare Other

## 2021-06-17 DIAGNOSIS — M25511 Pain in right shoulder: Secondary | ICD-10-CM | POA: Insufficient documentation

## 2021-06-17 DIAGNOSIS — M47812 Spondylosis without myelopathy or radiculopathy, cervical region: Secondary | ICD-10-CM | POA: Diagnosis not present

## 2021-06-17 DIAGNOSIS — Z7984 Long term (current) use of oral hypoglycemic drugs: Secondary | ICD-10-CM | POA: Diagnosis not present

## 2021-06-17 DIAGNOSIS — E119 Type 2 diabetes mellitus without complications: Secondary | ICD-10-CM | POA: Insufficient documentation

## 2021-06-17 DIAGNOSIS — R42 Dizziness and giddiness: Secondary | ICD-10-CM | POA: Diagnosis not present

## 2021-06-17 DIAGNOSIS — I10 Essential (primary) hypertension: Secondary | ICD-10-CM | POA: Insufficient documentation

## 2021-06-17 DIAGNOSIS — S199XXA Unspecified injury of neck, initial encounter: Secondary | ICD-10-CM | POA: Diagnosis not present

## 2021-06-17 DIAGNOSIS — Z79899 Other long term (current) drug therapy: Secondary | ICD-10-CM | POA: Insufficient documentation

## 2021-06-17 LAB — CBC WITH DIFFERENTIAL/PLATELET
Abs Immature Granulocytes: 0.03 10*3/uL (ref 0.00–0.07)
Basophils Absolute: 0.1 10*3/uL (ref 0.0–0.1)
Basophils Relative: 1 %
Eosinophils Absolute: 0.3 10*3/uL (ref 0.0–0.5)
Eosinophils Relative: 4 %
HCT: 41 % (ref 36.0–46.0)
Hemoglobin: 13 g/dL (ref 12.0–15.0)
Immature Granulocytes: 0 %
Lymphocytes Relative: 20 %
Lymphs Abs: 1.5 10*3/uL (ref 0.7–4.0)
MCH: 27.8 pg (ref 26.0–34.0)
MCHC: 31.7 g/dL (ref 30.0–36.0)
MCV: 87.8 fL (ref 80.0–100.0)
Monocytes Absolute: 0.6 10*3/uL (ref 0.1–1.0)
Monocytes Relative: 7 %
Neutro Abs: 5.1 10*3/uL (ref 1.7–7.7)
Neutrophils Relative %: 68 %
Platelets: 101 10*3/uL — ABNORMAL LOW (ref 150–400)
RBC: 4.67 MIL/uL (ref 3.87–5.11)
RDW: 13.8 % (ref 11.5–15.5)
WBC: 7.5 10*3/uL (ref 4.0–10.5)
nRBC: 0 % (ref 0.0–0.2)

## 2021-06-17 LAB — BASIC METABOLIC PANEL
Anion gap: 11 (ref 5–15)
BUN: 26 mg/dL — ABNORMAL HIGH (ref 8–23)
CO2: 27 mmol/L (ref 22–32)
Calcium: 10 mg/dL (ref 8.9–10.3)
Chloride: 100 mmol/L (ref 98–111)
Creatinine, Ser: 1.59 mg/dL — ABNORMAL HIGH (ref 0.44–1.00)
GFR, Estimated: 36 mL/min — ABNORMAL LOW (ref >60)
Glucose, Bld: 155 mg/dL — ABNORMAL HIGH (ref 70–99)
Potassium: 4.3 mmol/L (ref 3.5–5.1)
Sodium: 138 mmol/L (ref 135–145)

## 2021-06-17 MED ORDER — ACETAMINOPHEN 325 MG PO TABS
650.0000 mg | ORAL_TABLET | Freq: Once | ORAL | Status: AC
  Administered 2021-06-17: 650 mg via ORAL
  Filled 2021-06-17: qty 2

## 2021-06-17 NOTE — Discharge Instructions (Addendum)
Did not show any fracture or dislocation.  Take Tylenol Motrin if you have any additional pain.  Follow-up with your primary care doctor within the week.  Return if you have fevers worsening symptoms or any additional concerns.

## 2021-06-17 NOTE — ED Triage Notes (Signed)
Pt c/o pain to her right shoulder/ neck/arm and side from a fall x2weeks ago. Pt states she has periods of dizziness w/ hx of the same. Pt c/o of dizziness this morning.

## 2021-06-17 NOTE — ED Notes (Signed)
MD at bedside for MSE

## 2021-06-17 NOTE — ED Provider Notes (Signed)
[facility] EMERGENCY DEPARTMENT Provider Note   CSN: [id number] Arrival date & time: 06/17/21  [id number]     History Chief Complaint  Patient presents with   Dizziness    Barbara House is a 65 y.o. female.  Patient presents chief complaint of fall right shoulder right-sided neck pain.  She states she fell about 2 weeks ago but has had persistent discomfort in these areas for the past 2 weeks.  Denies any new numbness or weakness.  Denies headache or chest pain abdominal pain.  She states she has intermittent episodes of dizziness ongoing for several years and she had another episode 2 weeks ago which caused her to fall.      Past Medical History:  Diagnosis Date   Allergic rhinitis    Anxiety disorder    Arthritis    Gastroparesis    GERD (gastroesophageal reflux disease)    Headache    History of migraine    Hx of adenomatous colonic polyps    Hypertension    Thrombocytopenia ([place])    Type 2 diabetes mellitus ([place])    Not sure when she was diagnosed, greater than five years.     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   Lower abdominal pain 04/21/2021   Gastroparesis 04/21/2021   Chronic pain 03/28/2021   Chronic sinusitis 03/28/2021   Hammer toe 03/28/2021   Morbid obesity ([place]) 03/28/2021   Pure hypercholesterolemia 03/28/2021   Solitary pulmonary nodule 03/28/2021   Vitamin D deficiency 03/28/2021   Left flank pain 01/07/2021   Left-sided back pain 01/07/2021   Bloating 01/07/2021   LUQ pain 04/27/2020   Early satiety 03/02/2020   Thrombocytopenia ([place]) 08/29/2019   AKI (acute kidney injury) ([place]) 07/31/2019   Gastritis without peptic ulcer disease    Esophageal dysphagia 08/09/2018   Encounter for screening colonoscopy 08/09/2018   Hyperlipidemia LDL goal <70 10/12/2017   Anxiety disorder    Type 2 diabetes mellitus ([place])    Hypertension    Allergic rhinitis    Angina pectoris ([place]) 08/03/2016   Abnormal  nuclear stress test 08/03/2016   Abnormal myocardial perfusion study    Rhinitis 06/25/2015   Gastroesophageal reflux disease 06/25/2015   Diabetes mellitus ([place]) 06/25/2015    Past Surgical History:  Procedure Laterality Date   ABDOMINAL HYSTERECTOMY     has ovaries.    BALLOON DILATION N/A 05/17/2021   Procedure: BALLOON DILATION;  Surgeon: [name], DO;  [facility];  Service: Endoscopy;  Laterality: N/A;   BIOPSY  01/31/2021   Procedure: BIOPSY;  Surgeon: [name], DO;  [facility];  Service: Endoscopy;;   CARDIAC CATHETERIZATION N/A 08/03/2016   Procedure: Left Heart Cath and Coronary Angiography;  Surgeon: Peter M Martinique, MD;  Location: [place] [facility];  Service: Cardiovascular;  Laterality: N/A;   COLONOSCOPY WITH PROPOFOL N/A 11/05/2018   Dr. Oneida Alar: int/ext hemorrhoids, 84m cecal tubular adenoma removed, next TCS in 5-10 years   ESOPHAGOGASTRODUODENOSCOPY (EGD) WITH PROPOFOL N/A 11/05/2018   Dr. FOneida Alar esophagus was dilated, mild gastritis with benign bx   ESOPHAGOGASTRODUODENOSCOPY (EGD) WITH PROPOFOL N/A 01/31/2021   Surgeon: CEloise Harman DO;  Normal esophagus, large amount of food residue in the stomach, gastritis biopsied (mild chronic gastritis, negative for H. pylori), normal examined duodenum s/p biopsy (benign).   ESOPHAGOGASTRODUODENOSCOPY (EGD) WITH PROPOFOL N/A 05/17/2021   Procedure: ESOPHAGOGASTRODUODENOSCOPY (EGD) WITH PROPOFOL;  Surgeon: CEloise Harman  DO;  [facility];  Service: Endoscopy;  Laterality: N/A;  8:00am   Miinor skin surgery     POLYPECTOMY  11/05/2018   Procedure: POLYPECTOMY;  Surgeon: Danie Binder, MD;  [facility];  Service: Endoscopy;;  cecal polyp   SAVORY DILATION N/A 11/05/2018   Procedure: SAVORY DILATION;  Surgeon: Danie Binder, MD;  [facility];  Service: Endoscopy;  Laterality: N/A;     OB History   No obstetric history on file.     Family  History  Problem Relation Age of Onset   Hypertension Mother    Diabetes Mother    Hypertension Son    Diabetes Son    Cancer Father    Cirrhosis Brother    Alcohol abuse Brother    Colon cancer Neg Hx     Social History   Tobacco Use   Smoking status: Never   Smokeless tobacco: Never  Vaping Use   Vaping Use: Never used  Substance Use Topics   Alcohol use: No    Alcohol/week: 0.0 standard drinks   Drug use: No    Home Medications Prior to Admission medications   Medication Sig Start Date End Date Taking? Authorizing Provider  atorvastatin (LIPITOR) 20 MG tablet Take 20 mg by mouth in the morning.    [provider]  chlorthalidone (HYGROTON) 25 MG tablet Take 25 mg by mouth every morning. 05/25/20   [provider]  Cholecalciferol (VITAMIN D3) 125 MCG (5000 UT) CAPS Take 5,000 Units by mouth in the morning. 03/16/21   [provider]  cycloSPORINE (RESTASIS) 0.05 % ophthalmic emulsion Place 1 drop into both eyes 2 (two) times daily as needed (dry eyes).    [provider]  dexlansoprazole (DEXILANT) 60 MG capsule TAKE ONE CAPSULE BY MOUTH EVERY MORNING WITH breakfast. Patient taking differently: Take 60 mg by mouth daily with breakfast. 04/28/21   Mahala Menghini, PA-C  diclofenac Sodium (VOLTAREN) 1 % GEL Apply 2 g topically 4 (four) times daily. Apply to medial left elbow 11/09/20   Mordecai Rasmussen, MD  FARXIGA 10 MG TABS tablet Take 10 mg by mouth in the morning. 05/25/20   [provider]  Fluocinolone Acetonide 0.01 % OIL Place 1 application in ear(s) 2 (two) times daily as needed (ear itching). Patient not taking: Reported on 05/19/2021 06/28/20   [provider]  GE100 BLOOD GLUCOSE TEST test strip CHECK BLOOD SUGAR ONCE DAILY OR AS DIRECTED BY PHYSICIAN 05/07/20   [provider]  glipiZIDE (GLUCOTROL XL) 10 MG 24 hr tablet Take 10 mg by mouth every morning. 05/10/20   [provider]  ipratropium (ATROVENT)  0.03 % nasal spray Place 2 sprays into both nostrils 3 (three) times daily as needed for rhinitis. Patient not taking: Reported on 05/19/2021 06/28/20   [provider]  losartan (COZAAR) 100 MG tablet Take 100 mg by mouth in the morning.    [provider]  montelukast (SINGULAIR) 10 MG tablet TAKE 1 TABLET (10 MG TOTAL) BY MOUTH AT BEDTIME AS NEEDED (FOR ALLERGIES.). Patient not taking: Reported on 05/19/2021 09/22/19   Kennith Gain, MD  nystatin cream (MYCOSTATIN) Apply 1 application topically 2 (two) times daily as needed (vaginal itching). Patient not taking: Reported on 05/19/2021 05/25/20   [provider]  PARoxetine (PAXIL) 40 MG tablet TAKE ONE TABLET BY MOUTH EVERY MORNING. Patient taking differently: Take 40 mg by mouth in the morning. 01/01/18  Caren Macadam, MD  sitaGLIPtin-metformin (JANUMET) 50-1000 MG tablet Take 1 tablet by mouth 2 (two) times daily with a meal.    [provider]  verapamil (CALAN-SR) 240 MG CR tablet TAKE ONE TABLET BY MOUTH EVERY EVENING. Patient taking differently: Take 240 mg by mouth in the morning. 06/24/18   Caren Macadam, MD  vitamin B-12 (CYANOCOBALAMIN) 500 MCG tablet Take 500 mcg by mouth in the morning.    [provider]    Allergies    Prednisone  Review of Systems   Review of Systems  Constitutional:  Negative for fever.  HENT:  Negative for ear pain.   Eyes:  Negative for pain.  Respiratory:  Negative for cough.   Cardiovascular:  Negative for chest pain.  Gastrointestinal:  Negative for abdominal pain.  Genitourinary:  Negative for flank pain.  Musculoskeletal:  Negative for back pain.  Skin:  Negative for rash.  Neurological:  Negative for headaches.   Physical Exam Updated Vital Signs BP 108/75   Pulse 73   Temp 98.4 F (36.9 C) (Oral)   Resp 18   Ht '5\' 1"'$  (1.549 m)   Wt 88 kg   SpO2 97%   BMI 36.66 kg/m   Physical Exam Constitutional:      General: She is not in acute  distress.    Appearance: Normal appearance.  HENT:     Head: Normocephalic.     Nose: Nose normal.  Eyes:     Extraocular Movements: Extraocular movements intact.  Cardiovascular:     Rate and Rhythm: Normal rate.  Pulmonary:     Effort: Pulmonary effort is normal.  Musculoskeletal:        General: Normal range of motion.     Cervical back: Normal range of motion.  Neurological:     General: No focal deficit present.     Mental Status: She is alert and oriented to person, place, and time. Mental status is at baseline.     Cranial Nerves: No cranial nerve deficit.     Motor: No weakness.     Gait: Gait normal.    ED Results / Procedures / Treatments   Labs (all labs ordered are listed, but only abnormal results are displayed) Labs Reviewed  BASIC METABOLIC PANEL - Abnormal; Notable for the following components:      Result Value   Glucose, Bld 155 (*)    BUN 26 (*)    Creatinine, Ser 1.59 (*)    GFR, Estimated 36 (*)    All other components within normal limits  CBC WITH DIFFERENTIAL/PLATELET - Abnormal; Notable for the following components:   Platelets 101 (*)    All other components within normal limits    EKG None  Radiology DG Cervical Spine Complete  Result Date: 06/17/2021 CLINICAL DATA:  Fall with pain in right shoulder and neck. Injury was 2 weeks ago. EXAM: CERVICAL SPINE - COMPLETE 4+ VIEW COMPARISON:  None. FINDINGS: Ventral spondylitic spurring and disc space narrowing at C6-7. Mild upper cervical facet spurring. No bony foraminal stenosis, fracture deformity, or endplate erosion. No prevertebral thickening. IMPRESSION: 1. No visible injury. 2. Disc degeneration focally notable at C6-7. Electronically Signed   By: Monte Fantasia M.D.   On: 06/17/2021 11:32   DG Shoulder Right  Result Date: 06/17/2021 CLINICAL DATA:  Right shoulder pain after fall 2 weeks ago. EXAM: RIGHT SHOULDER - 2+ VIEW COMPARISON:  None. FINDINGS: There is no evidence of fracture or  dislocation. There is no  evidence of arthropathy or other focal bone abnormality. Soft tissues are unremarkable. IMPRESSION: Negative. Electronically Signed   By: Marijo Conception M.D.   On: 06/17/2021 11:33    Procedures Procedures   Medications Ordered in ED Medications  acetaminophen (TYLENOL) tablet 650 mg (650 mg Oral Given 06/17/21 1107)    ED Course  I have reviewed the triage vital signs and the nursing notes.  Pertinent labs & imaging results that were available during my care of the patient were reviewed by me and considered in my medical decision making (see chart for details).    MDM Rules/Calculators/A&P                           Labs are unremarkable white count and chemistry normal.  Imaging shows no acute findings.  Patient advised outpatient follow-up with her doctor within the week, advised immediate return for worsening symptoms fevers pain or any additional concerns.  Final Clinical Impression(s) / ED Diagnoses Final diagnoses:  Acute pain of right shoulder    Rx / DC Orders ED Discharge Orders     None        Luna Fuse, MD 06/17/21 1247

## 2021-07-07 DIAGNOSIS — J301 Allergic rhinitis due to pollen: Secondary | ICD-10-CM | POA: Diagnosis not present

## 2021-07-07 DIAGNOSIS — J019 Acute sinusitis, unspecified: Secondary | ICD-10-CM | POA: Diagnosis not present

## 2021-07-08 DIAGNOSIS — M7918 Myalgia, other site: Secondary | ICD-10-CM | POA: Diagnosis not present

## 2021-07-08 DIAGNOSIS — M47812 Spondylosis without myelopathy or radiculopathy, cervical region: Secondary | ICD-10-CM | POA: Diagnosis not present

## 2021-07-08 DIAGNOSIS — E1143 Type 2 diabetes mellitus with diabetic autonomic (poly)neuropathy: Secondary | ICD-10-CM | POA: Diagnosis not present

## 2021-07-08 DIAGNOSIS — M25511 Pain in right shoulder: Secondary | ICD-10-CM | POA: Diagnosis not present

## 2021-07-16 IMAGING — CT CT HEAD W/O CM
3 series · 15 of 43 positions shown, 18 images · non-contrast
Comparison: Maxillofacial CT 05/02/2017

CLINICAL DATA: Dizziness, headache, blurred vision

EXAM:
CT HEAD WITHOUT CONTRAST
TECHNIQUE: Contiguous axial images were obtained from the base of the skull
through the vertex without intravenous contrast.

[Series 2: head w o · axial · 0.45mm/px · z∈[+175,+280]mm · 9 of 26 slices shown, 12 images]
[im 3/26  brain]
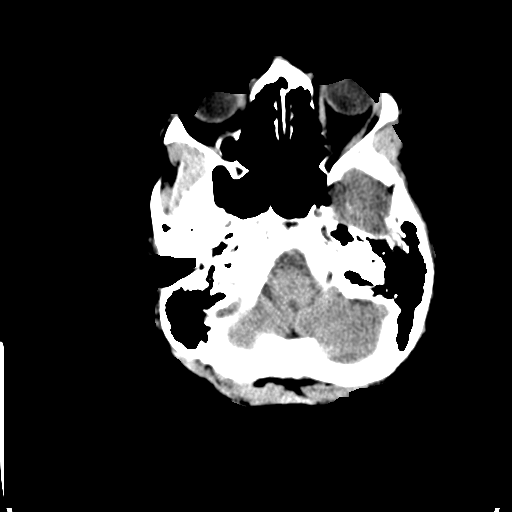
[im 3/26  bone]
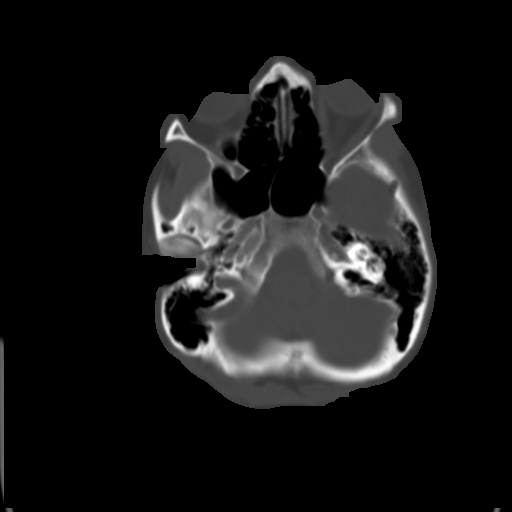
[im 6/26  brain]
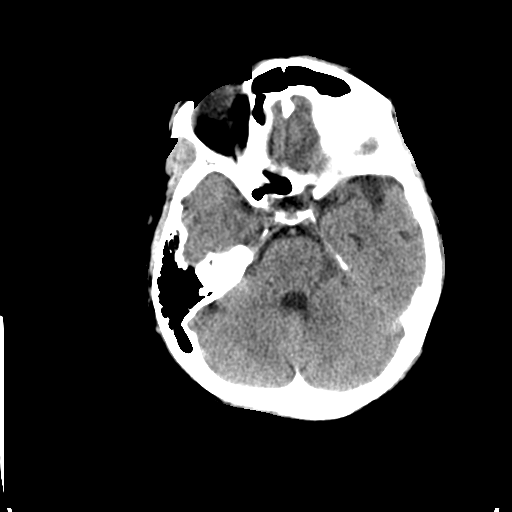
[im 8/26  brain]
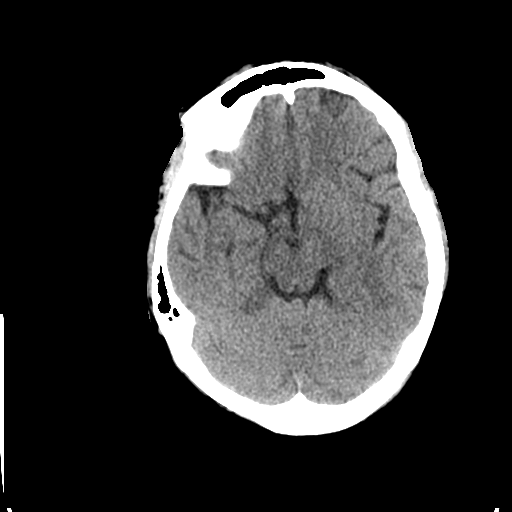
[im 11/26  brain]
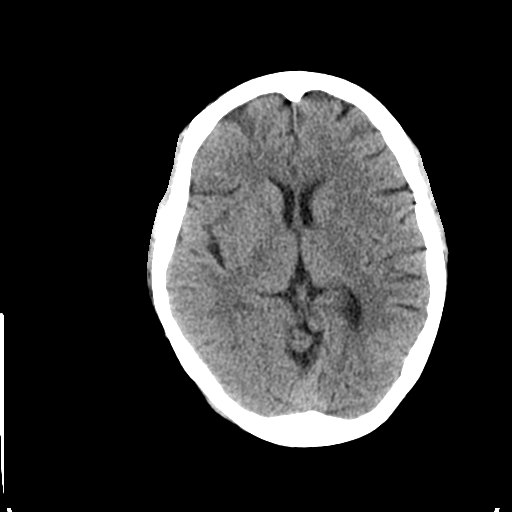
[im 14/26  brain]
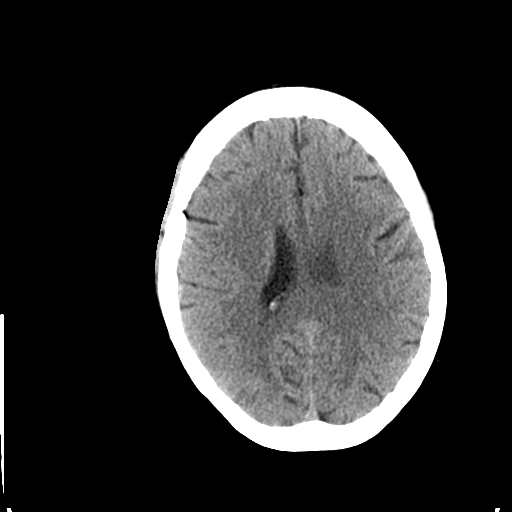
[im 14/26  bone]
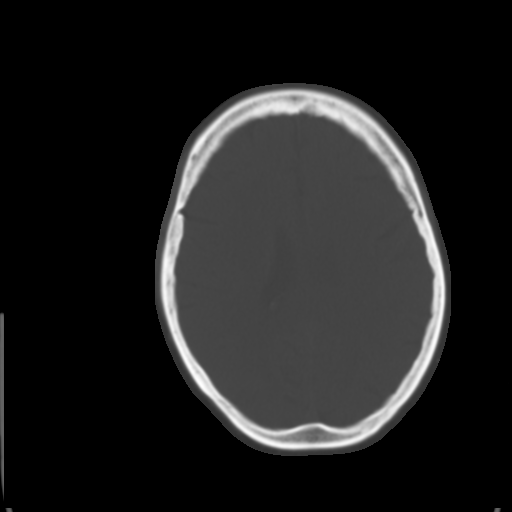
[im 16/26  brain]
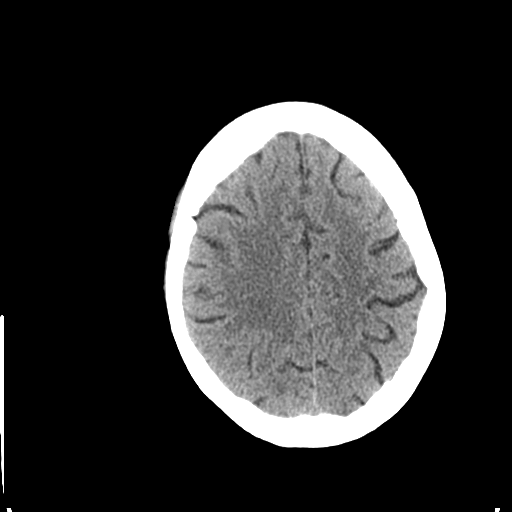
[im 19/26  brain]
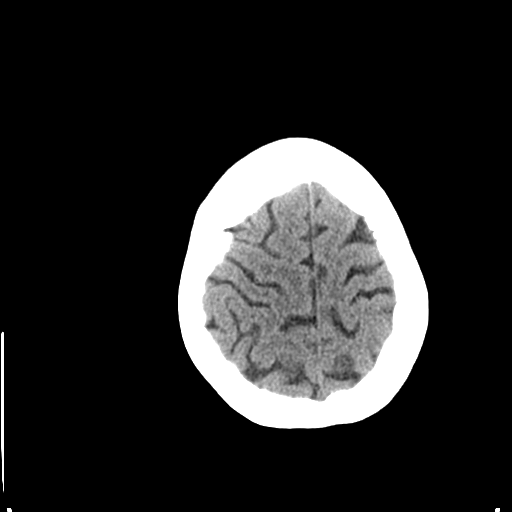
[im 22/26  brain]
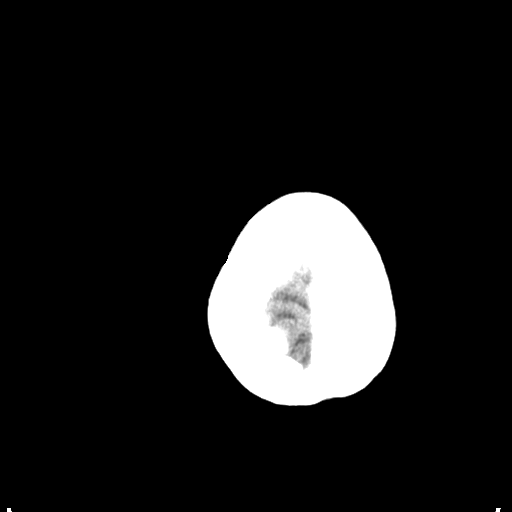
[im 24/26  brain]
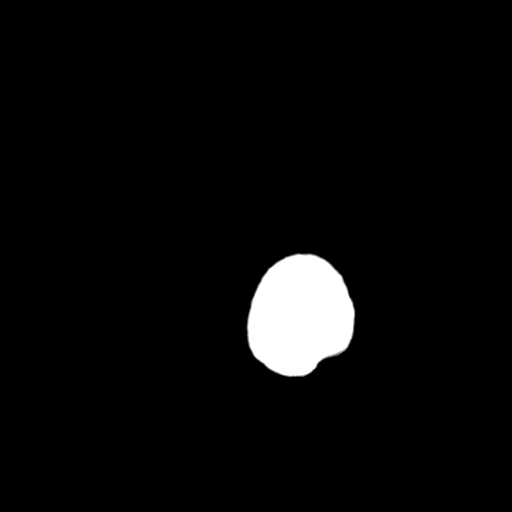
[im 24/26  bone]
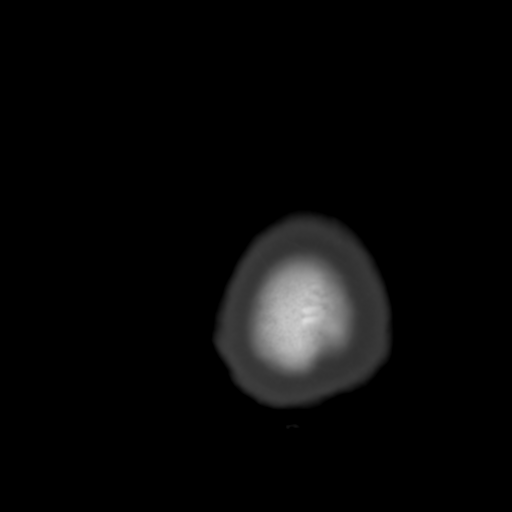

[Series 4: coronal soft · coronal · 0.27mm/px · 3 of 65 slices shown]
[im 22/65  brain]
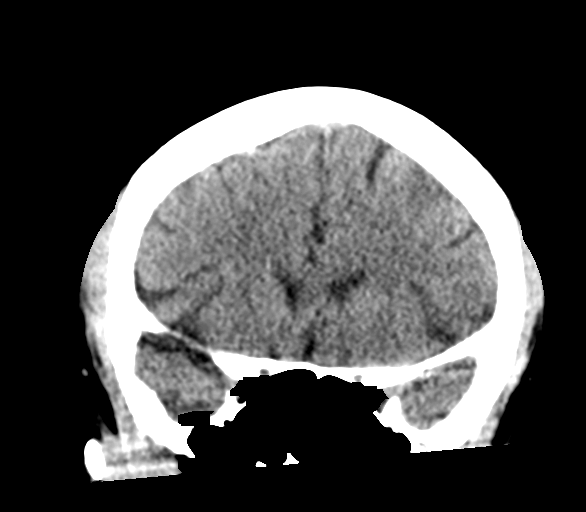
[im 29/65  brain]
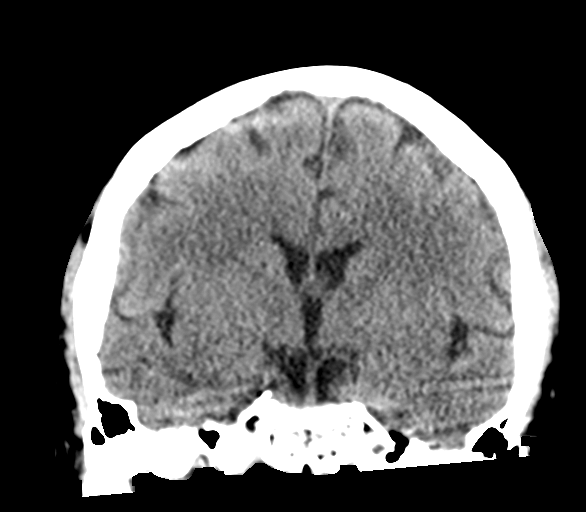
[im 36/65  brain]
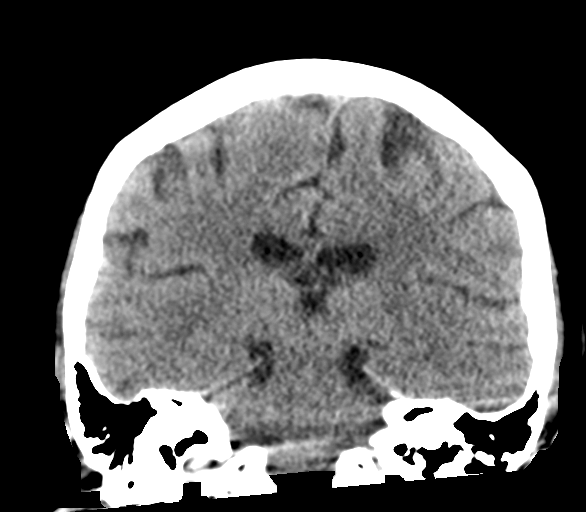

[Series 5: sagittal soft · sagittal · 0.29mm/px · 3 of 50 slices shown]
[im 17/50  brain]
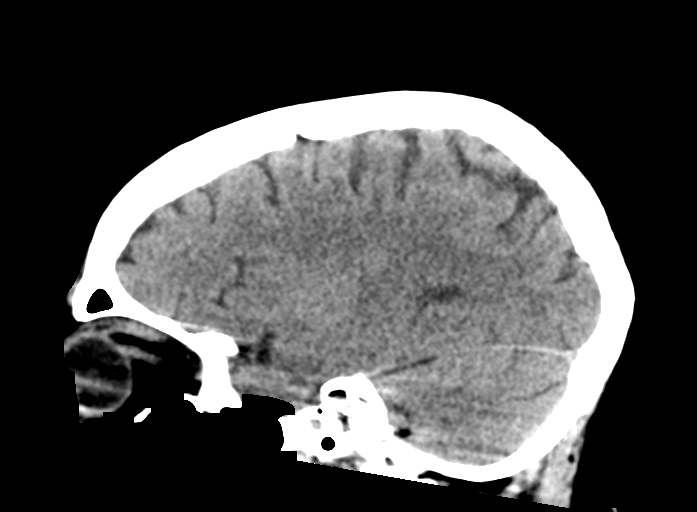
[im 25/50  brain]
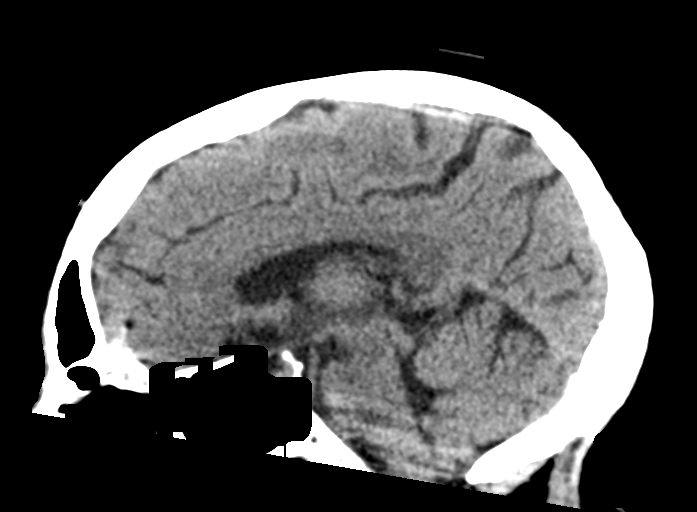
[im 33/50  brain]
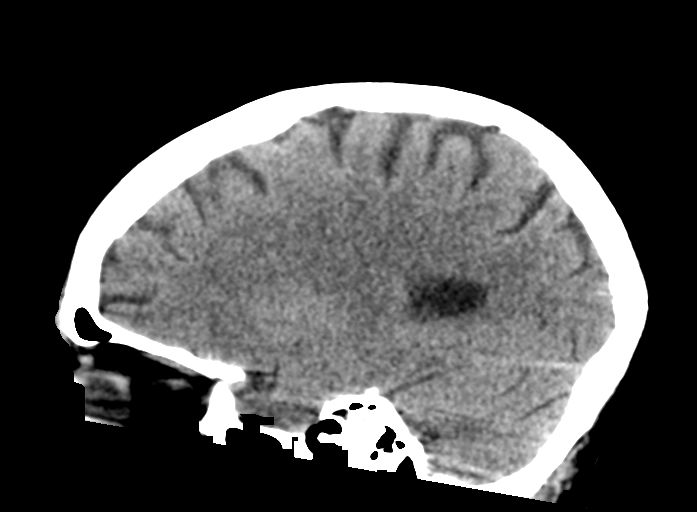

[15 of 43 positions shown; findings below may reference images not displayed]

FINDINGS: Brain: No evidence of acute infarction, hemorrhage, hydrocephalus,
extra-axial collection or mass lesion/mass effect.

Vascular: Atherosclerotic calcification of the carotid siphons. No
hyperdense vessel.

Skull: No calvarial fracture or suspicious osseous lesion. No scalp
swelling or hematoma.

Sinuses/Orbits: Paranasal sinuses and mastoid air cells are
predominantly clear. Included orbital structures are unremarkable.

Other: None
IMPRESSION: No acute intracranial abnormality.

## 2021-07-19 DIAGNOSIS — L600 Ingrowing nail: Secondary | ICD-10-CM | POA: Diagnosis not present

## 2021-07-19 DIAGNOSIS — M79675 Pain in left toe(s): Secondary | ICD-10-CM | POA: Diagnosis not present

## 2021-07-19 DIAGNOSIS — L602 Onychogryphosis: Secondary | ICD-10-CM | POA: Diagnosis not present

## 2021-07-19 DIAGNOSIS — E1149 Type 2 diabetes mellitus with other diabetic neurological complication: Secondary | ICD-10-CM | POA: Diagnosis not present

## 2021-07-19 DIAGNOSIS — B351 Tinea unguium: Secondary | ICD-10-CM | POA: Diagnosis not present

## 2021-07-19 DIAGNOSIS — M79674 Pain in right toe(s): Secondary | ICD-10-CM | POA: Diagnosis not present

## 2021-07-27 ENCOUNTER — Encounter: Payer: Self-pay | Admitting: Orthopaedic Surgery

## 2021-07-27 ENCOUNTER — Ambulatory Visit (INDEPENDENT_AMBULATORY_CARE_PROVIDER_SITE_OTHER): Payer: Medicare Other | Admitting: Orthopaedic Surgery

## 2021-07-27 DIAGNOSIS — M25511 Pain in right shoulder: Secondary | ICD-10-CM | POA: Diagnosis not present

## 2021-07-27 DIAGNOSIS — G8929 Other chronic pain: Secondary | ICD-10-CM | POA: Diagnosis not present

## 2021-07-27 DIAGNOSIS — M542 Cervicalgia: Secondary | ICD-10-CM | POA: Diagnosis not present

## 2021-07-27 NOTE — Progress Notes (Signed)
Subjective:    Patient ID: Barbara House, female    DOB: 10/13/1956, 65 y.o.   MRN: [id number]  HPI She fell at her home and injured the right shoulder and neck around the end of August of this year.  She fell with outstretched hand.  She had pain in the right shoulder and neck that radiated to the hand.  She had X-rays done 06-17-21 which were negative of the neck and shoulder.    She has pain radiating to the right index and long fingers.  She has no weakness.  She has no swelling.  She has pain with raising her hand overhead.  She has some pain on the right side.  She has tried Tylenol and has a rub that helps.  She does not want an injection of prednisone as she says it makes her hair come out.  I will begin PT/OT for the shoulder and the neck.  She may need MRI of the neck.  I have independently reviewed and interpreted x-rays of this patient done at another site by another physician or qualified health professional.    Review of Systems  Constitutional:  Positive for activity change.  Musculoskeletal:  Positive for arthralgias, myalgias and neck pain.  All other systems reviewed and are negative. For Review of Systems, all other systems reviewed and are negative.  The following is a summary of the past history medically, past history surgically, known current medicines, social history and family history.  This information is gathered electronically by the computer from prior information and documentation.  I review this each visit and have found including this information at this point in the chart is beneficial and informative.   Past Medical History:  Diagnosis Date   Allergic rhinitis    Anxiety disorder    Arthritis    Gastroparesis    GERD (gastroesophageal reflux disease)    Headache    History of migraine    Hx of adenomatous colonic polyps    Hypertension    Thrombocytopenia ([place])    Type 2 diabetes mellitus ([place])    Not sure when she was diagnosed, greater  than five years.     Past Surgical History:  Procedure Laterality Date   ABDOMINAL HYSTERECTOMY     has ovaries.    BALLOON DILATION N/A 05/17/2021   Procedure: BALLOON DILATION;  Surgeon: [name], DO;  [facility];  Service: Endoscopy;  Laterality: N/A;   BIOPSY  01/31/2021   Procedure: BIOPSY;  Surgeon: [name], DO;  [facility];  Service: Endoscopy;;   CARDIAC CATHETERIZATION N/A 08/03/2016   Procedure: Left Heart Cath and Coronary Angiography;  Surgeon: Peter M Martinique, MD;  Location: [place] [facility];  Service: Cardiovascular;  Laterality: N/A;   COLONOSCOPY WITH PROPOFOL N/A 11/05/2018   Dr. Oneida Alar: int/ext hemorrhoids, 26mm cecal tubular adenoma removed, next TCS in 5-10 years   ESOPHAGOGASTRODUODENOSCOPY (EGD) WITH PROPOFOL N/A 11/05/2018   Dr. Oneida Alar: esophagus was dilated, mild gastritis with benign bx   ESOPHAGOGASTRODUODENOSCOPY (EGD) WITH PROPOFOL N/A 01/31/2021   Surgeon: [name], DO;  Normal esophagus, large amount of food residue in the stomach, gastritis biopsied (mild chronic gastritis, negative for H. pylori), normal examined duodenum s/p biopsy (benign).   ESOPHAGOGASTRODUODENOSCOPY (EGD) WITH PROPOFOL N/A 05/17/2021   Procedure: ESOPHAGOGASTRODUODENOSCOPY (EGD) WITH PROPOFOL;  Surgeon: [name], DO;  [facility];  Service: Endoscopy;  Laterality: N/A;  8:00am   Miinor skin  surgery     POLYPECTOMY  11/05/2018   Procedure: POLYPECTOMY;  Surgeon: Danie Binder, MD;  [facility];  Service: Endoscopy;;  cecal polyp   SAVORY DILATION N/A 11/05/2018   Procedure: SAVORY DILATION;  Surgeon: Danie Binder, MD;  [facility];  Service: Endoscopy;  Laterality: N/A;    Current Outpatient Medications on File Prior to Visit  Medication Sig Dispense Refill   atorvastatin (LIPITOR) 20 MG tablet Take 20 mg by mouth in the morning.     chlorthalidone (HYGROTON) 25 MG tablet Take 25 mg by  mouth every morning.     Cholecalciferol (VITAMIN D3) 125 MCG (5000 UT) CAPS Take 5,000 Units by mouth in the morning.     cycloSPORINE (RESTASIS) 0.05 % ophthalmic emulsion Place 1 drop into both eyes 2 (two) times daily as needed (dry eyes).     dexlansoprazole (DEXILANT) 60 MG capsule TAKE ONE CAPSULE BY MOUTH EVERY MORNING WITH breakfast. (Patient taking differently: Take 60 mg by mouth daily with breakfast.) 90 capsule 3   diclofenac Sodium (VOLTAREN) 1 % GEL Apply 2 g topically 4 (four) times daily. Apply to medial left elbow 150 g 1   FARXIGA 10 MG TABS tablet Take 10 mg by mouth in the morning.     Fluocinolone Acetonide 0.01 % OIL Place 1 application in ear(s) 2 (two) times daily as needed (ear itching).     GE100 BLOOD GLUCOSE TEST test strip CHECK BLOOD SUGAR ONCE DAILY OR AS DIRECTED BY PHYSICIAN     glipiZIDE (GLUCOTROL XL) 10 MG 24 hr tablet Take 10 mg by mouth every morning.     ipratropium (ATROVENT) 0.03 % nasal spray Place 2 sprays into both nostrils 3 (three) times daily as needed for rhinitis.     losartan (COZAAR) 100 MG tablet Take 100 mg by mouth in the morning.     montelukast (SINGULAIR) 10 MG tablet TAKE 1 TABLET (10 MG TOTAL) BY MOUTH AT BEDTIME AS NEEDED (FOR ALLERGIES.). 90 tablet 1   nystatin cream (MYCOSTATIN) Apply 1 application topically 2 (two) times daily as needed (vaginal itching).     PARoxetine (PAXIL) 40 MG tablet TAKE ONE TABLET BY MOUTH EVERY MORNING. (Patient taking differently: Take 40 mg by mouth in the morning.) 90 tablet 0   sitaGLIPtin-metformin (JANUMET) 50-1000 MG tablet Take 1 tablet by mouth 2 (two) times daily with a meal.     verapamil (CALAN-SR) 240 MG CR tablet TAKE ONE TABLET BY MOUTH EVERY EVENING. (Patient taking differently: Take 240 mg by mouth in the morning.) 90 tablet 0   vitamin B-12 (CYANOCOBALAMIN) 500 MCG tablet Take 500 mcg by mouth in the morning.     No current facility-administered medications on file prior to visit.     Social History   Socioeconomic History   Marital status: Single    Spouse name: Not on file   Number of children: 1   Years of education: Not on file   Highest education level: Not on file  Occupational History   Not on file  Tobacco Use   Smoking status: Never   Smokeless tobacco: Never  Vaping Use   Vaping Use: Never used  Substance and Sexual Activity   Alcohol use: No    Alcohol/week: 0.0 standard drinks   Drug use: No   Sexual activity: Not Currently    Birth control/protection: Surgical  Other Topics Concern   Not on file  Social History Narrative   Reports is  on disability. Has one son. Lives in [place]. Enjoys time with family. Enjoys Firefighter. Attends church.    Social Determinants of Health   Financial Resource Strain: Low Risk    Difficulty of Paying Living Expenses: Not very hard  Food Insecurity: No Food Insecurity   Worried About Charity fundraiser in the Last Year: Never true   Ran Out of Food in the Last Year: Never true  Transportation Needs: No Transportation Needs   Lack of Transportation (Medical): No   Lack of Transportation (Non-Medical): No  Physical Activity: Insufficiently Active   Days of Exercise per Week: 3 days   Minutes of Exercise per Session: 20 min  Stress: Stress Concern Present   Feeling of Stress : To some extent  Social Connections: Moderately Isolated   Frequency of Communication with Friends and Family: More than three times a week   Frequency of Social Gatherings with Friends and Family: More than three times a week   Attends Religious Services: More than 4 times per year   Active Member of [employer] or Organizations: No   Attends Music therapist: Not on file   Marital Status: Never married  Human resources officer Violence: Not At Risk   Fear of Current or Ex-Partner: No   Emotionally Abused: No   Physically Abused: No   Sexually Abused: No    Family History  Problem Relation Age of Onset   Hypertension  Mother    Diabetes Mother    Hypertension Son    Diabetes Son    Cancer Father    Cirrhosis Brother    Alcohol abuse Brother    Colon cancer Neg Hx     There were no vitals taken for this visit.  There is no height or weight on file to calculate BMI.     Objective:   Physical Exam Vitals and nursing note reviewed. Exam conducted with a chaperone present.  Constitutional:      Appearance: She is well-developed.  HENT:     Head: Normocephalic and atraumatic.  Eyes:     Conjunctiva/sclera: Conjunctivae normal.     Pupils: Pupils are equal, round, and reactive to light.  Cardiovascular:     Rate and Rhythm: Normal rate and regular rhythm.  Pulmonary:     Effort: Pulmonary effort is normal.  Abdominal:     Palpations: Abdomen is soft.  Musculoskeletal:       Arms:     Cervical back: Normal range of motion and neck supple.  Skin:    General: Skin is warm and dry.  Neurological:     Mental Status: She is alert and oriented to person, place, and time.     Cranial Nerves: No cranial nerve deficit.     Motor: No abnormal muscle tone.     Coordination: Coordination normal.     Deep Tendon Reflexes: Reflexes are normal and symmetric. Reflexes normal.  Psychiatric:        Behavior: Behavior normal.        Thought Content: Thought content normal.        Judgment: Judgment normal.          Assessment & Plan:   Encounter Diagnoses  Name Primary?   Chronic right shoulder pain Yes   Neck pain    Begin PT.  Continue her Tylenol and Voltaren Gel.  Return in one month.  She may need MRI.  Call if any problem.  Precautions discussed.  Electronically Signed Sanjuana Kava, MD 10/12/202210:02  AM

## 2021-08-05 ENCOUNTER — Ambulatory Visit (HOSPITAL_COMMUNITY)
Admission: RE | Admit: 2021-08-05 | Discharge: 2021-08-05 | Disposition: A | Discharge status: Home / Self Care | Payer: Medicare Other | Source: Ambulatory Visit | Attending: Family Medicine | Admitting: Family Medicine

## 2021-08-05 ENCOUNTER — Other Ambulatory Visit: Payer: Self-pay

## 2021-08-05 DIAGNOSIS — R911 Solitary pulmonary nodule: Secondary | ICD-10-CM | POA: Insufficient documentation

## 2021-08-05 LAB — POCT I-STAT CREATININE: Creatinine, Ser: 1.4 mg/dL — ABNORMAL HIGH (ref 0.44–1.00)

## 2021-08-05 MED ORDER — IOHEXOL 300 MG/ML  SOLN
75.0000 mL | Freq: Once | INTRAMUSCULAR | Status: AC | PRN
  Administered 2021-08-05: 60 mL via INTRAVENOUS

## 2021-08-11 DIAGNOSIS — M542 Cervicalgia: Secondary | ICD-10-CM | POA: Diagnosis not present

## 2021-08-11 DIAGNOSIS — M436 Torticollis: Secondary | ICD-10-CM | POA: Diagnosis not present

## 2021-08-11 DIAGNOSIS — R29898 Other symptoms and signs involving the musculoskeletal system: Secondary | ICD-10-CM | POA: Diagnosis not present

## 2021-08-11 DIAGNOSIS — Z124 Encounter for screening for malignant neoplasm of cervix: Secondary | ICD-10-CM | POA: Diagnosis not present

## 2021-08-11 DIAGNOSIS — M25511 Pain in right shoulder: Secondary | ICD-10-CM | POA: Diagnosis not present

## 2021-08-11 DIAGNOSIS — M25611 Stiffness of right shoulder, not elsewhere classified: Secondary | ICD-10-CM | POA: Diagnosis not present

## 2021-08-11 DIAGNOSIS — Z0000 Encounter for general adult medical examination without abnormal findings: Secondary | ICD-10-CM | POA: Diagnosis not present

## 2021-08-15 DIAGNOSIS — I10 Essential (primary) hypertension: Secondary | ICD-10-CM | POA: Diagnosis not present

## 2021-08-17 ENCOUNTER — Emergency Department (HOSPITAL_COMMUNITY): Payer: Medicare Other

## 2021-08-17 ENCOUNTER — Other Ambulatory Visit: Payer: Self-pay

## 2021-08-17 ENCOUNTER — Emergency Department (HOSPITAL_COMMUNITY)
Admission: EM | Admit: 2021-08-17 | Discharge: 2021-08-17 | Disposition: A | Discharge status: Home / Self Care | Payer: Medicare Other | Attending: Emergency Medicine | Admitting: Emergency Medicine

## 2021-08-17 ENCOUNTER — Encounter (HOSPITAL_COMMUNITY): Payer: Self-pay

## 2021-08-17 DIAGNOSIS — R109 Unspecified abdominal pain: Secondary | ICD-10-CM | POA: Diagnosis not present

## 2021-08-17 DIAGNOSIS — E119 Type 2 diabetes mellitus without complications: Secondary | ICD-10-CM | POA: Insufficient documentation

## 2021-08-17 DIAGNOSIS — K529 Noninfective gastroenteritis and colitis, unspecified: Secondary | ICD-10-CM | POA: Insufficient documentation

## 2021-08-17 DIAGNOSIS — Z79899 Other long term (current) drug therapy: Secondary | ICD-10-CM | POA: Diagnosis not present

## 2021-08-17 DIAGNOSIS — E860 Dehydration: Secondary | ICD-10-CM | POA: Diagnosis not present

## 2021-08-17 DIAGNOSIS — Z7984 Long term (current) use of oral hypoglycemic drugs: Secondary | ICD-10-CM | POA: Insufficient documentation

## 2021-08-17 DIAGNOSIS — I10 Essential (primary) hypertension: Secondary | ICD-10-CM | POA: Insufficient documentation

## 2021-08-17 DIAGNOSIS — R197 Diarrhea, unspecified: Secondary | ICD-10-CM | POA: Diagnosis present

## 2021-08-17 LAB — COMPREHENSIVE METABOLIC PANEL
ALT: 16 U/L (ref 0–44)
AST: 23 U/L (ref 15–41)
Albumin: 4 g/dL (ref 3.5–5.0)
Alkaline Phosphatase: 66 U/L (ref 38–126)
Anion gap: 9 (ref 5–15)
BUN: 41 mg/dL — ABNORMAL HIGH (ref 8–23)
CO2: 24 mmol/L (ref 22–32)
Calcium: 10 mg/dL (ref 8.9–10.3)
Chloride: 106 mmol/L (ref 98–111)
Creatinine, Ser: 1.57 mg/dL — ABNORMAL HIGH (ref 0.44–1.00)
GFR, Estimated: 37 mL/min — ABNORMAL LOW (ref >60)
Glucose, Bld: 69 mg/dL — ABNORMAL LOW (ref 70–99)
Potassium: 4.4 mmol/L (ref 3.5–5.1)
Sodium: 139 mmol/L (ref 135–145)
Total Bilirubin: 0.3 mg/dL (ref 0.3–1.2)
Total Protein: 7.6 g/dL (ref 6.5–8.1)

## 2021-08-17 LAB — CBC WITH DIFFERENTIAL/PLATELET
Abs Immature Granulocytes: 0.02 10*3/uL (ref 0.00–0.07)
Basophils Absolute: 0.1 10*3/uL (ref 0.0–0.1)
Basophils Relative: 1 %
Eosinophils Absolute: 0.2 10*3/uL (ref 0.0–0.5)
Eosinophils Relative: 4 %
HCT: 40.7 % (ref 36.0–46.0)
Hemoglobin: 13.1 g/dL (ref 12.0–15.0)
Immature Granulocytes: 0 %
Lymphocytes Relative: 23 %
Lymphs Abs: 1.5 10*3/uL (ref 0.7–4.0)
MCH: 28.2 pg (ref 26.0–34.0)
MCHC: 32.2 g/dL (ref 30.0–36.0)
MCV: 87.7 fL (ref 80.0–100.0)
Monocytes Absolute: 0.5 10*3/uL (ref 0.1–1.0)
Monocytes Relative: 9 %
Neutro Abs: 4.1 10*3/uL (ref 1.7–7.7)
Neutrophils Relative %: 63 %
Platelets: 94 10*3/uL — ABNORMAL LOW (ref 150–400)
RBC: 4.64 MIL/uL (ref 3.87–5.11)
RDW: 13.8 % (ref 11.5–15.5)
WBC: 6.4 10*3/uL (ref 4.0–10.5)
nRBC: 0 % (ref 0.0–0.2)

## 2021-08-17 MED ORDER — IOHEXOL 300 MG/ML  SOLN
100.0000 mL | Freq: Once | INTRAMUSCULAR | Status: AC | PRN
  Administered 2021-08-17: 80 mL via INTRAVENOUS

## 2021-08-17 MED ORDER — SODIUM CHLORIDE 0.9 % IV BOLUS
2000.0000 mL | Freq: Once | INTRAVENOUS | Status: AC
  Administered 2021-08-17: 2000 mL via INTRAVENOUS

## 2021-08-17 NOTE — ED Provider Notes (Signed)
[facility] EMERGENCY DEPARTMENT Provider Note   CSN: [id number] Arrival date & time: 08/17/21  8115     History Chief Complaint  Patient presents with   Diarrhea    Barbara House is a 65 y.o. female.  Patient states she has been having diarrhea for 2 weeks.  No blood in her diarrhea  The history is provided by the patient and medical records.  Diarrhea Quality:  Watery Severity:  Moderate Onset quality:  Sudden Timing:  Constant Progression:  Worsening Relieved by:  Nothing Worsened by:  Nothing Ineffective treatments:  None tried Associated symptoms: abdominal pain   Associated symptoms: no headaches   Risk factors: no recent antibiotic use       Past Medical History:  Diagnosis Date   Allergic rhinitis    Anxiety disorder    Arthritis    Gastroparesis    GERD (gastroesophageal reflux disease)    Headache    History of migraine    Hx of adenomatous colonic polyps    Hypertension    Thrombocytopenia (HCC)    Type 2 diabetes mellitus ([place])    Not sure when she was diagnosed, greater than five years.     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   Lower abdominal pain 04/21/2021   Gastroparesis 04/21/2021   Chronic pain 03/28/2021   Chronic sinusitis 03/28/2021   Hammer toe 03/28/2021   Morbid obesity ([place]) 03/28/2021   Pure hypercholesterolemia 03/28/2021   Solitary pulmonary nodule 03/28/2021   Vitamin D deficiency 03/28/2021   Left flank pain 01/07/2021   Left-sided back pain 01/07/2021   Bloating 01/07/2021   LUQ pain 04/27/2020   Early satiety 03/02/2020   Thrombocytopenia ([place]) 08/29/2019   AKI (acute kidney injury) ([place]) 07/31/2019   Gastritis without peptic ulcer disease    Esophageal dysphagia 08/09/2018   Encounter for screening colonoscopy 08/09/2018   Hyperlipidemia LDL goal <70 10/12/2017   Anxiety disorder    Type 2 diabetes mellitus ([place])    Hypertension    Allergic rhinitis    Angina  pectoris ([place]) 08/03/2016   Abnormal nuclear stress test 08/03/2016   Abnormal myocardial perfusion study    Rhinitis 06/25/2015   Gastroesophageal reflux disease 06/25/2015   Diabetes mellitus ([place]) 06/25/2015    Past Surgical History:  Procedure Laterality Date   ABDOMINAL HYSTERECTOMY     has ovaries.    BALLOON DILATION N/A 05/17/2021   Procedure: BALLOON DILATION;  Surgeon: [name], DO;  [facility];  Service: Endoscopy;  Laterality: N/A;   BIOPSY  01/31/2021   Procedure: BIOPSY;  Surgeon: [name], DO;  Location[facility];  Service: Endoscopy;;   CARDIAC CATHETERIZATION N/A 08/03/2016   Procedure: Left Heart Cath and Coronary Angiography;  Surgeon: Peter M Martinique, MD;  Location: [facility];  Service: Cardiovascular;  Laterality: N/A;   COLONOSCOPY WITH PROPOFOL N/A 11/05/2018   Dr. Oneida Alar: int/ext hemorrhoids, 91mm cecal tubular adenoma removed, next TCS in 5-10 years   ESOPHAGOGASTRODUODENOSCOPY (EGD) WITH PROPOFOL N/A 11/05/2018   Dr. Oneida Alar: esophagus was dilated, mild gastritis with benign bx   ESOPHAGOGASTRODUODENOSCOPY (EGD) WITH PROPOFOL N/A 01/31/2021   Surgeon: [name], DO;  Normal esophagus, large amount of food residue in the stomach, gastritis biopsied (mild chronic gastritis, negative for H. pylori), normal examined duodenum s/p biopsy (benign).   ESOPHAGOGASTRODUODENOSCOPY (EGD) WITH PROPOFOL N/A 05/17/2021   Procedure: ESOPHAGOGASTRODUODENOSCOPY (EGD) WITH PROPOFOL;  Surgeon:  [name], DO;  [facility];  Service: Endoscopy;  Laterality: N/A;  8:00am   Miinor skin surgery     POLYPECTOMY  11/05/2018   Procedure: POLYPECTOMY;  Surgeon: Danie Binder, MD;  Location[facility];  Service: Endoscopy;;  cecal polyp   SAVORY DILATION N/A 11/05/2018   Procedure: SAVORY DILATION;  Surgeon: Danie Binder, MD;  Location[facility];  Service: Endoscopy;  Laterality: N/A;     OB History   No  obstetric history on file.     Family History  Problem Relation Age of Onset   Hypertension Mother    Diabetes Mother    Hypertension Son    Diabetes Son    Cancer Father    Cirrhosis Brother    Alcohol abuse Brother    Colon cancer Neg Hx     Social History   Tobacco Use   Smoking status: Never   Smokeless tobacco: Never  Vaping Use   Vaping Use: Never used  Substance Use Topics   Alcohol use: No    Alcohol/week: 0.0 standard drinks   Drug use: No    Home Medications Prior to Admission medications   Medication Sig Start Date End Date Taking? Authorizing Provider  atorvastatin (LIPITOR) 20 MG tablet Take 20 mg by mouth in the morning.    [provider]  chlorthalidone (HYGROTON) 25 MG tablet Take 25 mg by mouth every morning. 05/25/20   [provider]  Cholecalciferol (VITAMIN D3) 125 MCG (5000 UT) CAPS Take 5,000 Units by mouth in the morning. 03/16/21   [provider]  cycloSPORINE (RESTASIS) 0.05 % ophthalmic emulsion Place 1 drop into both eyes 2 (two) times daily as needed (dry eyes).    [provider]  dexlansoprazole (DEXILANT) 60 MG capsule TAKE ONE CAPSULE BY MOUTH EVERY MORNING WITH breakfast. Patient taking differently: Take 60 mg by mouth daily with breakfast. 04/28/21   Mahala Menghini, PA-C  diclofenac Sodium (VOLTAREN) 1 % GEL Apply 2 g topically 4 (four) times daily. Apply to medial left elbow 11/09/20   Mordecai Rasmussen, MD  FARXIGA 10 MG TABS tablet Take 10 mg by mouth in the morning. 05/25/20   [provider]  Fluocinolone Acetonide 0.01 % OIL Place 1 application in ear(s) 2 (two) times daily as needed (ear itching). 06/28/20   [provider]  GE100 BLOOD GLUCOSE TEST test strip CHECK BLOOD SUGAR ONCE DAILY OR AS DIRECTED BY PHYSICIAN 05/07/20   [provider]  glipiZIDE (GLUCOTROL XL) 10 MG 24 hr tablet Take 10 mg by mouth every morning. 05/10/20   [provider]  ipratropium (ATROVENT)  0.03 % nasal spray Place 2 sprays into both nostrils 3 (three) times daily as needed for rhinitis. 06/28/20   [provider]  losartan (COZAAR) 100 MG tablet Take 100 mg by mouth in the morning.    [provider]  montelukast (SINGULAIR) 10 MG tablet TAKE 1 TABLET (10 MG TOTAL) BY MOUTH AT BEDTIME AS NEEDED (FOR ALLERGIES.). 09/22/19   Kennith Gain, MD  nystatin cream (MYCOSTATIN) Apply 1 application topically 2 (two) times daily as needed (vaginal itching). 05/25/20   [provider]  PARoxetine (PAXIL) 40 MG tablet TAKE ONE TABLET BY MOUTH EVERY MORNING. Patient taking differently: Take 40 mg by mouth in the morning. 01/01/18   Caren Macadam, MD  sitaGLIPtin-metformin (JANUMET) 50-1000 MG tablet Take 1 tablet by mouth 2 (two) times daily with a meal.  [provider]  verapamil (CALAN-SR) 240 MG CR tablet TAKE ONE TABLET BY MOUTH EVERY EVENING. Patient taking differently: Take 240 mg by mouth in the morning. 06/24/18   Caren Macadam, MD  vitamin B-12 (CYANOCOBALAMIN) 500 MCG tablet Take 500 mcg by mouth in the morning.    [provider]    Allergies    Prednisone  Review of Systems   Review of Systems  Constitutional:  Negative for appetite change and fatigue.  HENT:  Negative for congestion, ear discharge and sinus pressure.   Eyes:  Negative for discharge.  Respiratory:  Negative for cough.   Cardiovascular:  Negative for chest pain.  Gastrointestinal:  Positive for abdominal pain and diarrhea.  Genitourinary:  Negative for frequency and hematuria.  Musculoskeletal:  Negative for back pain.  Skin:  Negative for rash.  Neurological:  Negative for seizures and headaches.  Psychiatric/Behavioral:  Negative for hallucinations.    Physical Exam Updated Vital Signs BP 127/90   Pulse 82   Temp 98.3 F (36.8 C) (Oral)   Resp 20   Ht 5\' 1"  (1.549 m)   Wt 88 kg   SpO2 100%   BMI 36.66 kg/m   Physical Exam Vitals and  nursing note reviewed.  Constitutional:      Appearance: She is well-developed.  HENT:     Head: Normocephalic.     Nose: Nose normal.  Eyes:     General: No scleral icterus.    Conjunctiva/sclera: Conjunctivae normal.  Neck:     Thyroid: No thyromegaly.  Cardiovascular:     Rate and Rhythm: Normal rate and regular rhythm.     Heart sounds: No murmur heard.   No friction rub. No gallop.  Pulmonary:     Breath sounds: No stridor. No wheezing or rales.  Chest:     Chest wall: No tenderness.  Abdominal:     General: There is no distension.     Tenderness: There is abdominal tenderness. There is no rebound.  Musculoskeletal:        General: Normal range of motion.     Cervical back: Neck supple.  Lymphadenopathy:     Cervical: No cervical adenopathy.  Skin:    Findings: No erythema or rash.  Neurological:     Mental Status: She is alert and oriented to person, place, and time.     Motor: No abnormal muscle tone.     Coordination: Coordination normal.  Psychiatric:        Behavior: Behavior normal.    ED Results / Procedures / Treatments   Labs (all labs ordered are listed, but only abnormal results are displayed) Labs Reviewed  CBC WITH DIFFERENTIAL/PLATELET - Abnormal; Notable for the following components:      Result Value   Platelets 94 (*)    All other components within normal limits  COMPREHENSIVE METABOLIC PANEL - Abnormal; Notable for the following components:   Glucose, Bld 69 (*)    BUN 41 (*)    Creatinine, Ser 1.57 (*)    GFR, Estimated 37 (*)    All other components within normal limits  C DIFFICILE QUICK SCREEN W PCR REFLEX    GASTROINTESTINAL PANEL BY PCR, STOOL (REPLACES STOOL CULTURE)    EKG None  Radiology CT ABDOMEN PELVIS W CONTRAST  Result Date: 08/17/2021 CLINICAL DATA:  Generalized abdominal pain for 2 weeks with diarrhea. EXAM: CT ABDOMEN AND PELVIS WITH CONTRAST TECHNIQUE: Multidetector CT imaging of the abdomen and pelvis was performed  using  the standard protocol following bolus administration of intravenous contrast. CONTRAST:  14mL OMNIPAQUE IOHEXOL 300 MG/ML  SOLN COMPARISON:  02/03/2021. FINDINGS: Lower chest: Clear lung bases.  Heart normal in size. Hepatobiliary: No focal liver abnormality is seen. No gallstones, gallbladder wall thickening, or biliary dilatation. Pancreas: Unremarkable. No pancreatic ductal dilatation or surrounding inflammatory changes. Spleen: Normal in size without focal abnormality. Adrenals/Urinary Tract: No adrenal masses. Kidneys normal size, orientation and position with symmetric enhancement and excretion. 1.8 cm cortical cyst, midpole the right kidney. No other renal masses, no stones and no hydronephrosis. Normal ureters. Normal bladder. Stomach/Bowel: Stomach is within normal limits. Appendix appears normal. No evidence of bowel wall thickening, distention, or inflammatory changes. Vascular/Lymphatic: No significant vascular findings are present. No enlarged abdominal or pelvic lymph nodes. Reproductive: Unremarkable. Other: No abdominal wall hernia or abnormality. No abdominopelvic ascites. Musculoskeletal: No fracture or acute finding. No bone lesion. Degenerative changes at L4-L5 with a grade 1 anterolisthesis. IMPRESSION: 1. No acute findings within the abdomen or pelvis. 2. Subpleural ground-glass nodule noted at the right lung base on the prior CT has resolved. No follow-up recommended. 3. Stable right renal cyst. Electronically Signed   By: Lajean Manes M.D.   On: 08/17/2021 11:56    Procedures Procedures   Medications Ordered in ED Medications  sodium chloride 0.9 % bolus 2,000 mL (2,000 mLs Intravenous New Bag/Given 08/17/21 1014)  iohexol (OMNIPAQUE) 300 MG/ML solution 100 mL (80 mLs Intravenous Contrast Given 08/17/21 1139)    ED Course  I have reviewed the triage vital signs and the nursing notes.  Pertinent labs & imaging results that were available during my care of the patient were  reviewed by me and considered in my medical decision making (see chart for details).    MDM Rules/Calculators/A&P                           Patient with 2 weeks of diarrhea.  Patient mild dehydration.  CT scan unremarkable.  She was unable to give a specimen for the diarrhea while she is in the ED.  She will be discharged home to follow-up with GI and will return with specimens Final Clinical Impression(s) / ED Diagnoses Final diagnoses:  Gastroenteritis    Rx / DC Orders ED Discharge Orders     None        Milton Ferguson, MD 08/19/21 [phone]

## 2021-08-17 NOTE — ED Triage Notes (Signed)
Patient reports diarrhea for the past two weeks. States that she has noticed red blood when wiping. Reports intermittent lower abdominal pain.

## 2021-08-17 NOTE — Discharge Instructions (Signed)
Drink plenty of fluids.  Follow-up with Dr. Laural Golden or one of his associates in the next week or 2.  Return if problem

## 2021-08-17 NOTE — ED Notes (Signed)
Sig. Pad not available. Consent for MSE obtained verbally.

## 2021-08-19 DIAGNOSIS — M436 Torticollis: Secondary | ICD-10-CM | POA: Diagnosis not present

## 2021-08-19 DIAGNOSIS — R29898 Other symptoms and signs involving the musculoskeletal system: Secondary | ICD-10-CM | POA: Diagnosis not present

## 2021-08-19 DIAGNOSIS — M25511 Pain in right shoulder: Secondary | ICD-10-CM | POA: Diagnosis not present

## 2021-08-19 DIAGNOSIS — M25611 Stiffness of right shoulder, not elsewhere classified: Secondary | ICD-10-CM | POA: Diagnosis not present

## 2021-08-19 DIAGNOSIS — M542 Cervicalgia: Secondary | ICD-10-CM | POA: Diagnosis not present

## 2021-08-19 LAB — C DIFFICILE QUICK SCREEN W PCR REFLEX
C Diff antigen: NEGATIVE
C Diff interpretation: NOT DETECTED
C Diff toxin: NEGATIVE

## 2021-08-20 LAB — GASTROINTESTINAL PANEL BY PCR, STOOL (REPLACES STOOL CULTURE)

## 2021-08-22 DIAGNOSIS — M25511 Pain in right shoulder: Secondary | ICD-10-CM | POA: Diagnosis not present

## 2021-08-22 DIAGNOSIS — M542 Cervicalgia: Secondary | ICD-10-CM | POA: Diagnosis not present

## 2021-08-22 DIAGNOSIS — M25611 Stiffness of right shoulder, not elsewhere classified: Secondary | ICD-10-CM | POA: Diagnosis not present

## 2021-08-22 DIAGNOSIS — M436 Torticollis: Secondary | ICD-10-CM | POA: Diagnosis not present

## 2021-08-22 DIAGNOSIS — R29898 Other symptoms and signs involving the musculoskeletal system: Secondary | ICD-10-CM | POA: Diagnosis not present

## 2021-08-24 ENCOUNTER — Encounter: Payer: Self-pay | Admitting: Orthopaedic Surgery

## 2021-08-24 ENCOUNTER — Ambulatory Visit (INDEPENDENT_AMBULATORY_CARE_PROVIDER_SITE_OTHER): Payer: Medicare Other | Admitting: Orthopaedic Surgery

## 2021-08-24 VITALS — Ht 61.0 in | Wt 194.4 lb

## 2021-08-24 DIAGNOSIS — M25511 Pain in right shoulder: Secondary | ICD-10-CM

## 2021-08-24 DIAGNOSIS — M25611 Stiffness of right shoulder, not elsewhere classified: Secondary | ICD-10-CM | POA: Diagnosis not present

## 2021-08-24 DIAGNOSIS — G8929 Other chronic pain: Secondary | ICD-10-CM | POA: Diagnosis not present

## 2021-08-24 DIAGNOSIS — M542 Cervicalgia: Secondary | ICD-10-CM

## 2021-08-24 DIAGNOSIS — M436 Torticollis: Secondary | ICD-10-CM | POA: Diagnosis not present

## 2021-08-24 DIAGNOSIS — R29898 Other symptoms and signs involving the musculoskeletal system: Secondary | ICD-10-CM | POA: Diagnosis not present

## 2021-08-24 NOTE — Progress Notes (Signed)
I am a little better.  She has been going to PT and is making good progress.  I have reviewed the notes.  She has no numbness.  She has less pain and better motion.  She has no spasm.  ROM of the right shoulder is full with tenderness in extremes.  NV intact. ROM neck is full, no spasm.  Encounter Diagnoses  Name Primary?   Neck pain Yes   Chronic right shoulder pain    I will see her in six weeks.  Complete remaining sessions of PT.  Continue exercises at home and medicines.  Call if any problem.  Precautions discussed.  Electronically Signed Sanjuana Kava, MD 11/9/20229:09 AM

## 2021-08-29 DIAGNOSIS — R29898 Other symptoms and signs involving the musculoskeletal system: Secondary | ICD-10-CM | POA: Diagnosis not present

## 2021-08-29 DIAGNOSIS — M25611 Stiffness of right shoulder, not elsewhere classified: Secondary | ICD-10-CM | POA: Diagnosis not present

## 2021-08-29 DIAGNOSIS — M25511 Pain in right shoulder: Secondary | ICD-10-CM | POA: Diagnosis not present

## 2021-08-29 DIAGNOSIS — M436 Torticollis: Secondary | ICD-10-CM | POA: Diagnosis not present

## 2021-08-29 DIAGNOSIS — M542 Cervicalgia: Secondary | ICD-10-CM | POA: Diagnosis not present

## 2021-08-31 DIAGNOSIS — F419 Anxiety disorder, unspecified: Secondary | ICD-10-CM | POA: Diagnosis not present

## 2021-08-31 DIAGNOSIS — E559 Vitamin D deficiency, unspecified: Secondary | ICD-10-CM | POA: Diagnosis not present

## 2021-08-31 DIAGNOSIS — E538 Deficiency of other specified B group vitamins: Secondary | ICD-10-CM | POA: Diagnosis not present

## 2021-08-31 DIAGNOSIS — I10 Essential (primary) hypertension: Secondary | ICD-10-CM | POA: Diagnosis not present

## 2021-08-31 DIAGNOSIS — Z23 Encounter for immunization: Secondary | ICD-10-CM | POA: Diagnosis not present

## 2021-08-31 DIAGNOSIS — E782 Mixed hyperlipidemia: Secondary | ICD-10-CM | POA: Diagnosis not present

## 2021-08-31 DIAGNOSIS — L309 Dermatitis, unspecified: Secondary | ICD-10-CM | POA: Diagnosis not present

## 2021-08-31 DIAGNOSIS — E1143 Type 2 diabetes mellitus with diabetic autonomic (poly)neuropathy: Secondary | ICD-10-CM | POA: Diagnosis not present

## 2021-09-02 DIAGNOSIS — R29898 Other symptoms and signs involving the musculoskeletal system: Secondary | ICD-10-CM | POA: Diagnosis not present

## 2021-09-02 DIAGNOSIS — M25611 Stiffness of right shoulder, not elsewhere classified: Secondary | ICD-10-CM | POA: Diagnosis not present

## 2021-09-02 DIAGNOSIS — M25511 Pain in right shoulder: Secondary | ICD-10-CM | POA: Diagnosis not present

## 2021-09-02 DIAGNOSIS — M436 Torticollis: Secondary | ICD-10-CM | POA: Diagnosis not present

## 2021-09-02 DIAGNOSIS — M542 Cervicalgia: Secondary | ICD-10-CM | POA: Diagnosis not present

## 2021-09-07 DIAGNOSIS — M25511 Pain in right shoulder: Secondary | ICD-10-CM | POA: Diagnosis not present

## 2021-09-07 DIAGNOSIS — R29898 Other symptoms and signs involving the musculoskeletal system: Secondary | ICD-10-CM | POA: Diagnosis not present

## 2021-09-07 DIAGNOSIS — M25611 Stiffness of right shoulder, not elsewhere classified: Secondary | ICD-10-CM | POA: Diagnosis not present

## 2021-09-07 DIAGNOSIS — M436 Torticollis: Secondary | ICD-10-CM | POA: Diagnosis not present

## 2021-09-07 DIAGNOSIS — M542 Cervicalgia: Secondary | ICD-10-CM | POA: Diagnosis not present

## 2021-09-13 DIAGNOSIS — R531 Weakness: Secondary | ICD-10-CM | POA: Diagnosis not present

## 2021-09-13 DIAGNOSIS — M542 Cervicalgia: Secondary | ICD-10-CM | POA: Diagnosis not present

## 2021-09-13 DIAGNOSIS — M25511 Pain in right shoulder: Secondary | ICD-10-CM | POA: Diagnosis not present

## 2021-09-13 DIAGNOSIS — M25611 Stiffness of right shoulder, not elsewhere classified: Secondary | ICD-10-CM | POA: Diagnosis not present

## 2021-09-14 DIAGNOSIS — I10 Essential (primary) hypertension: Secondary | ICD-10-CM | POA: Diagnosis not present

## 2021-09-15 ENCOUNTER — Other Ambulatory Visit: Payer: Self-pay

## 2021-09-15 ENCOUNTER — Inpatient Hospital Stay (HOSPITAL_COMMUNITY): Payer: Medicare Other | Attending: Hematology

## 2021-09-15 DIAGNOSIS — E559 Vitamin D deficiency, unspecified: Secondary | ICD-10-CM | POA: Diagnosis not present

## 2021-09-15 DIAGNOSIS — D508 Other iron deficiency anemias: Secondary | ICD-10-CM | POA: Insufficient documentation

## 2021-09-15 DIAGNOSIS — E538 Deficiency of other specified B group vitamins: Secondary | ICD-10-CM

## 2021-09-15 DIAGNOSIS — Z79899 Other long term (current) drug therapy: Secondary | ICD-10-CM | POA: Insufficient documentation

## 2021-09-15 DIAGNOSIS — D696 Thrombocytopenia, unspecified: Secondary | ICD-10-CM | POA: Insufficient documentation

## 2021-09-15 DIAGNOSIS — D509 Iron deficiency anemia, unspecified: Secondary | ICD-10-CM

## 2021-09-15 LAB — CBC WITH DIFFERENTIAL/PLATELET
Abs Immature Granulocytes: 0.03 10*3/uL (ref 0.00–0.07)
Basophils Absolute: 0.1 10*3/uL (ref 0.0–0.1)
Basophils Relative: 1 %
Eosinophils Absolute: 0.3 10*3/uL (ref 0.0–0.5)
Eosinophils Relative: 5 %
HCT: 41.2 % (ref 36.0–46.0)
Hemoglobin: 13.1 g/dL (ref 12.0–15.0)
Immature Granulocytes: 1 %
Lymphocytes Relative: 21 %
Lymphs Abs: 1.3 10*3/uL (ref 0.7–4.0)
MCH: 28.1 pg (ref 26.0–34.0)
MCHC: 31.8 g/dL (ref 30.0–36.0)
MCV: 88.4 fL (ref 80.0–100.0)
Monocytes Absolute: 0.4 10*3/uL (ref 0.1–1.0)
Monocytes Relative: 7 %
Neutro Abs: 4.1 10*3/uL (ref 1.7–7.7)
Neutrophils Relative %: 65 %
RBC: 4.66 MIL/uL (ref 3.87–5.11)
RDW: 13.5 % (ref 11.5–15.5)
Smear Review: UNDETERMINED
WBC: 6.2 10*3/uL (ref 4.0–10.5)
nRBC: 0 % (ref 0.0–0.2)

## 2021-09-15 LAB — VITAMIN B12: Vitamin B-12: 846 pg/mL (ref 180–914)

## 2021-09-15 LAB — IRON AND TIBC
Iron: 53 ug/dL (ref 28–170)
Saturation Ratios: 11 % (ref 10.4–31.8)
TIBC: 480 ug/dL — ABNORMAL HIGH (ref 250–450)
UIBC: 427 ug/dL

## 2021-09-15 LAB — VITAMIN D 25 HYDROXY (VIT D DEFICIENCY, FRACTURES): Vit D, 25-Hydroxy: 61.05 ng/mL (ref 30–100)

## 2021-09-15 LAB — FERRITIN: Ferritin: 31 ng/mL (ref 11–307)

## 2021-09-16 DIAGNOSIS — M25511 Pain in right shoulder: Secondary | ICD-10-CM | POA: Diagnosis not present

## 2021-09-16 DIAGNOSIS — M25611 Stiffness of right shoulder, not elsewhere classified: Secondary | ICD-10-CM | POA: Diagnosis not present

## 2021-09-16 DIAGNOSIS — M542 Cervicalgia: Secondary | ICD-10-CM | POA: Diagnosis not present

## 2021-09-16 DIAGNOSIS — R531 Weakness: Secondary | ICD-10-CM | POA: Diagnosis not present

## 2021-09-18 LAB — METHYLMALONIC ACID, SERUM: Methylmalonic Acid, Quantitative: 125 nmol/L (ref 0–378)

## 2021-09-19 DIAGNOSIS — M25511 Pain in right shoulder: Secondary | ICD-10-CM | POA: Diagnosis not present

## 2021-09-19 DIAGNOSIS — R531 Weakness: Secondary | ICD-10-CM | POA: Diagnosis not present

## 2021-09-19 DIAGNOSIS — M25611 Stiffness of right shoulder, not elsewhere classified: Secondary | ICD-10-CM | POA: Diagnosis not present

## 2021-09-19 DIAGNOSIS — M542 Cervicalgia: Secondary | ICD-10-CM | POA: Diagnosis not present

## 2021-09-21 ENCOUNTER — Other Ambulatory Visit (HOSPITAL_COMMUNITY): Payer: Self-pay | Admitting: Physician Assistant

## 2021-09-21 DIAGNOSIS — D696 Thrombocytopenia, unspecified: Secondary | ICD-10-CM

## 2021-09-21 NOTE — Progress Notes (Signed)
[address], [place] [address]   CLINIC:  Medical Oncology/Hematology  PCP:  Caren Macadam, [place] [address] [phone]   REASON FOR VISIT:  Follow-up for thrombocytopenia   CURRENT THERAPY: Surveillance  INTERVAL HISTORY:  Barbara House 65 y.o. female returns for routine follow-up of thrombocytopenia and previous history of normocytic anemia.  She was last seen by Tarri Abernethy PA-C on 05/19/2021.  At today's visit, she reports feeling well.  No recent hospitalizations, surgeries, or changes in baseline health status.  She denies any easy bruising or petechial rash.  No major episodes of bleeding such as epistaxis, hematemesis, hematochezia, or melena.  She does have occasional scant red blood on the tissue when she wipes after a bowel movement.  She denies any significant fatigue, but does report that she has been tiring more easily lately.  No B symptoms such as fever, chills, night sweats, unintentional weight loss.  She continues to take her vitamin B12, ferrous sulfate, and vitamin D supplements at home as prescribed.  She has 100% energy and 100% appetite. She endorses that she is maintaining a stable weight.    REVIEW OF SYSTEMS:  Review of Systems  Constitutional:  Positive for fatigue (Tires easily). Negative for appetite change, chills, diaphoresis, fever and unexpected weight change.  HENT:   Negative for lump/mass and nosebleeds.   Eyes:  Negative for eye problems.  Respiratory:  Positive for cough. Negative for hemoptysis and shortness of breath.   Cardiovascular:  Negative for chest pain, leg swelling and palpitations.  Gastrointestinal:  Positive for diarrhea. Negative for abdominal pain, blood in stool, constipation, nausea and vomiting.       Heartburn/acid reflux  Genitourinary:  Negative for hematuria.   Musculoskeletal:  Positive for arthralgias.  Skin: Negative.   Neurological:   Positive for dizziness and headaches. Negative for light-headedness.  Hematological:  Does not bruise/bleed easily.     PAST MEDICAL/SURGICAL HISTORY:  Past Medical History:  Diagnosis Date   Allergic rhinitis    Anxiety disorder    Arthritis    Gastroparesis    GERD (gastroesophageal reflux disease)    Headache    History of migraine    Hx of adenomatous colonic polyps    Hypertension    Thrombocytopenia ([place])    Type 2 diabetes mellitus ([place])    Not sure when she was diagnosed, greater than five years.    Past Surgical History:  Procedure Laterality Date   ABDOMINAL HYSTERECTOMY     has ovaries.    BALLOON DILATION N/A 05/17/2021   Procedure: BALLOON DILATION;  Surgeon: [name], DO;  [facility];  Service: Endoscopy;  Laterality: N/A;   BIOPSY  01/31/2021   Procedure: BIOPSY;  Surgeon: [name], DO;  [facility];  Service: Endoscopy;;   CARDIAC CATHETERIZATION N/A 08/03/2016   Procedure: Left Heart Cath and Coronary Angiography;  Surgeon: Peter M Martinique, MD;  Location: [facility];  Service: Cardiovascular;  Laterality: N/A;   COLONOSCOPY WITH PROPOFOL N/A 11/05/2018   Dr. [facility] Alar: int/ext hemorrhoids, 4m cecal tubular adenoma removed, next TCS in 5-10 years   ESOPHAGOGASTRODUODENOSCOPY (EGD) WITH PROPOFOL N/A 11/05/2018   Dr. [place] Alar esophagus was dilated, mild gastritis with benign bx   ESOPHAGOGASTRODUODENOSCOPY (EGD) WITH PROPOFOL N/A 01/31/2021   Surgeon: CEloise Harman DO;  Normal esophagus, large amount of food residue in the stomach, gastritis biopsied (mild chronic gastritis, negative  for H. pylori), normal examined duodenum s/p biopsy (benign).   ESOPHAGOGASTRODUODENOSCOPY (EGD) WITH PROPOFOL N/A 05/17/2021   Procedure: ESOPHAGOGASTRODUODENOSCOPY (EGD) WITH PROPOFOL;  Surgeon: [name], DO;  [facility];  Service: Endoscopy;  Laterality: N/A;  8:00am   [place] skin surgery     POLYPECTOMY   11/05/2018   Procedure: POLYPECTOMY;  Surgeon: Danie Binder, MD;  [facility];  Service: Endoscopy;;  cecal polyp   SAVORY DILATION N/A 11/05/2018   Procedure: SAVORY DILATION;  Surgeon: Danie Binder, MD;  [facility];  Service: Endoscopy;  Laterality: N/A;     SOCIAL HISTORY:  Social History   Socioeconomic History   Marital status: Single    Spouse name: Not on file   Number of children: 1   Years of education: Not on file   Highest education level: Not on file  Occupational History   Not on file  Tobacco Use   Smoking status: Never   Smokeless tobacco: Never  Vaping Use   Vaping Use: Never used  Substance and Sexual Activity   Alcohol use: No    Alcohol/week: 0.0 standard drinks   Drug use: No   Sexual activity: Not Currently    Birth control/protection: Surgical  Other Topics Concern   Not on file  Social History Narrative   Reports is on disability. Has one son. Lives in [place]. Enjoys time with family. Enjoys Firefighter. Attends church.    Social Determinants of Health   Financial Resource Strain: Low Risk    Difficulty of Paying Living Expenses: Not very hard  Food Insecurity: No Food Insecurity   Worried About Charity fundraiser in the Last Year: Never true   Ran Out of Food in the Last Year: Never true  Transportation Needs: No Transportation Needs   Lack of Transportation (Medical): No   Lack of Transportation (Non-Medical): No  Physical Activity: Insufficiently Active   Days of Exercise per Week: 3 days   Minutes of Exercise per Session: 20 min  Stress: Stress Concern Present   Feeling of Stress : To some extent  Social Connections: Moderately Isolated   Frequency of Communication with Friends and Family: More than three times a week   Frequency of Social Gatherings with Friends and Family: More than three times a week   Attends Religious Services: More than 4 times per year   Active Member of [employer] or Organizations: No    Attends Music therapist: Not on file   Marital Status: Never married  Human resources officer Violence: Not At Risk   Fear of Current or Ex-Partner: No   Emotionally Abused: No   Physically Abused: No   Sexually Abused: No    FAMILY HISTORY:  Family History  Problem Relation Age of Onset   Hypertension Mother    Diabetes Mother    Hypertension Son    Diabetes Son    Cancer Father    Cirrhosis Brother    Alcohol abuse Brother    Colon cancer Neg Hx     CURRENT MEDICATIONS:  Outpatient Encounter Medications as of 09/22/2021  Medication Sig   atorvastatin (LIPITOR) 20 MG tablet Take 20 mg by mouth in the morning.   chlorthalidone (HYGROTON) 25 MG tablet Take 25 mg by mouth every morning.   Cholecalciferol (VITAMIN D3) 125 MCG (5000 UT) CAPS Take 5,000 Units by mouth in the morning.   cycloSPORINE (RESTASIS) 0.05 % ophthalmic emulsion Place 1 drop into both eyes  2 (two) times daily as needed (dry eyes).   dexlansoprazole (DEXILANT) 60 MG capsule TAKE ONE CAPSULE BY MOUTH EVERY MORNING WITH breakfast. (Patient taking differently: Take 60 mg by mouth daily with breakfast.)   diclofenac Sodium (VOLTAREN) 1 % GEL Apply 2 g topically 4 (four) times daily. Apply to medial left elbow   FARXIGA 10 MG TABS tablet Take 10 mg by mouth in the morning.   Fluocinolone Acetonide 0.01 % OIL Place 1 application in ear(s) 2 (two) times daily as needed (ear itching).   GE100 BLOOD GLUCOSE TEST test strip CHECK BLOOD SUGAR ONCE DAILY OR AS DIRECTED BY PHYSICIAN   glipiZIDE (GLUCOTROL XL) 10 MG 24 hr tablet Take 10 mg by mouth every morning.   ipratropium (ATROVENT) 0.03 % nasal spray Place 2 sprays into both nostrils 3 (three) times daily as needed for rhinitis.   losartan (COZAAR) 100 MG tablet Take 100 mg by mouth in the morning.   montelukast (SINGULAIR) 10 MG tablet TAKE 1 TABLET (10 MG TOTAL) BY MOUTH AT BEDTIME AS NEEDED (FOR ALLERGIES.).   nystatin cream (MYCOSTATIN) Apply 1 application  topically 2 (two) times daily as needed (vaginal itching).   PARoxetine (PAXIL) 40 MG tablet TAKE ONE TABLET BY MOUTH EVERY MORNING. (Patient taking differently: Take 40 mg by mouth in the morning.)   sitaGLIPtin-metformin (JANUMET) 50-1000 MG tablet Take 1 tablet by mouth 2 (two) times daily with a meal.   verapamil (CALAN-SR) 240 MG CR tablet TAKE ONE TABLET BY MOUTH EVERY EVENING. (Patient taking differently: Take 240 mg by mouth in the morning.)   vitamin B-12 (CYANOCOBALAMIN) 500 MCG tablet Take 500 mcg by mouth in the morning.   No facility-administered encounter medications on file as of 09/22/2021.    ALLERGIES:  Allergies  Allergen Reactions   Prednisone Other (See Comments)    Hair loss     PHYSICAL EXAM:  ECOG PERFORMANCE STATUS: 1 - Symptomatic but completely ambulatory  There were no vitals filed for this visit. There were no vitals filed for this visit. Physical Exam Constitutional:      Appearance: Normal appearance. She is obese.  HENT:     Head: Normocephalic and atraumatic.     Mouth/Throat:     Mouth: Mucous membranes are moist.  Eyes:     Extraocular Movements: Extraocular movements intact.     Pupils: Pupils are equal, round, and reactive to light.  Cardiovascular:     Rate and Rhythm: Normal rate and regular rhythm.     Pulses: Normal pulses.     Heart sounds: Normal heart sounds.  Pulmonary:     Effort: Pulmonary effort is normal.     Breath sounds: Normal breath sounds.  Abdominal:     General: Bowel sounds are normal.     Palpations: Abdomen is soft.     Tenderness: There is no abdominal tenderness.  Musculoskeletal:        General: No swelling.     Right lower leg: No edema.     Left lower leg: No edema.  Lymphadenopathy:     Cervical: No cervical adenopathy.  Skin:    General: Skin is warm and dry.  Neurological:     General: No focal deficit present.     Mental Status: She is alert and oriented to person, place, and time.  Psychiatric:         Mood and Affect: Mood normal.        Behavior: Behavior normal.  LABORATORY DATA:  I have reviewed the labs as listed.  CBC    Component Value Date/Time   WBC 6.2 09/15/2021 0957   RBC 4.66 09/15/2021 0957   HGB 13.1 09/15/2021 0957   HGB 11.7 06/25/2018 1548   HCT 41.2 09/15/2021 0957   HCT 34.7 06/25/2018 1548   PLT DCLMP 09/15/2021 0957   PLT 165 06/25/2018 1548   MCV 88.4 09/15/2021 0957   MCV 85 06/25/2018 1548   MCH 28.1 09/15/2021 0957   MCHC 31.8 09/15/2021 0957   RDW 13.5 09/15/2021 0957   RDW 13.5 06/25/2018 1548   LYMPHSABS 1.3 09/15/2021 0957   LYMPHSABS 2.4 06/25/2018 1548   MONOABS 0.4 09/15/2021 0957   EOSABS 0.3 09/15/2021 0957   EOSABS 0.3 06/25/2018 1548   BASOSABS 0.1 09/15/2021 0957   BASOSABS 0.0 06/25/2018 1548   CMP Latest Ref Rng & Units 08/17/2021 08/05/2021 06/17/2021  Glucose 70 - 99 mg/dL 69(L) - 155(H)  BUN 8 - 23 mg/dL 41(H) - 26(H)  Creatinine 0.44 - 1.00 mg/dL 1.57(H) 1.40(H) 1.59(H)  Sodium 135 - 145 mmol/L 139 - 138  Potassium 3.5 - 5.1 mmol/L 4.4 - 4.3  Chloride 98 - 111 mmol/L 106 - 100  CO2 22 - 32 mmol/L 24 - 27  Calcium 8.9 - 10.3 mg/dL 10.0 - 10.0  Total Protein 6.5 - 8.1 g/dL 7.6 - -  Total Bilirubin 0.3 - 1.2 mg/dL 0.3 - -  Alkaline Phos 38 - 126 U/L 66 - -  AST 15 - 41 U/L 23 - -  ALT 0 - 44 U/L 16 - -    DIAGNOSTIC IMAGING:  I have independently reviewed the relevant imaging and discussed with the patient.  ASSESSMENT & PLAN: 1.  Thrombocytopenia, mild to moderate - Initial work-up unable to determine cause of thrombocytopenia - SPEP was normal, hepatitis panel was normal, RF and ANA were negative, no overt nutritional deficiencies (normal B12, folate, methylmalonic acid, copper).  Previous testing of hepatitis C, HIV, and H. pylori were negative. - Pathologist smear review on 08/29/2019 showed mild thrombocytopenia, borderline anemia - Normal abdominal ultrasound from 08/15/2017 negative for liver abnormality,  spleen was normal in size and appearance - CT abdomen/pelvis (08/17/2021): Normal liver and spleen - Reported having one blood transfusion many years ago, but unsure of reason - Patient is taking vitamin B12 pills - Platelets have ranged from 80 to 109 since January 2020 - No major bleeding events, no B symptoms  - No abnormal bruising or petechial rash - CBC today (09/22/2021): Platelets 107, at baseline - Differential diagnosis includes immune thrombocytopenia versus early MDS  - PLAN:  No intervention needed at this time, we will continue to monitor.  RTC in 4 months for repeat labs and follow-up.  Will consider bone marrow biopsy if any significant changes in CBC.  2.  Normocytic anemia (resolved) with iron deficiency state - Patient has had prior history of mild normocytic anemia with hemoglobin ranging from 11-12, possibly secondary to CKD and iron deficiency - EGD (05/17/2021): No evidence of bleeding, erosions, ulcerations, or varices - Colonoscopy (11/05/2018): Polyp x1, external and internal hemorrhoids - Currently taking daily B12 tablet daily. - She was started on daily ferrous sulfate at her last visit.   - Symptomatic with mild fatigue, reports that she tires easily - No signs or symptoms of blood loss, denies bright red blood per rectum, melena, nosebleeds   - Most recent labs (09/15/2021): Normal Hgb 13.1, ferritin 31 with TIBC 480 and  11% iron saturation; B12, methylmalonic acid, and folate normal - PLAN: Anemia has resolved, but patient has signs and symptoms of iron deficiency despite 4 months of oral iron therapy.  Recommend IV iron with Feraheme x2.  RTC in 4 months for repeat labs.   3.  Vitamin D deficiency - Hypovitaminosis D evidenced in August 2021 with vitamin D level 23.76  - Currently taking Vitamin D3 cholecalciferol 5,000 units daily - Most recent vitamin D (09/15/2021) is normal at 61.05 - PLAN: Continue vitamin D supplementation.  Check vitamin D at next  appointment in 4 months.   PLAN SUMMARY & DISPOSITION: Feraheme x2 Labs and RTC in 4 months  All questions were answered. The patient knows to call the clinic with any problems, questions or concerns.  Medical decision making: Moderate  Time spent on visit: I spent 20 minutes counseling the patient face to face. The total time spent in the appointment was 30 minutes and more than 50% was on counseling.   Harriett Rush, PA-C  09/22/21 12:33 PM

## 2021-09-22 ENCOUNTER — Inpatient Hospital Stay (HOSPITAL_COMMUNITY): Payer: Medicare Other

## 2021-09-22 ENCOUNTER — Other Ambulatory Visit: Payer: Self-pay

## 2021-09-22 ENCOUNTER — Encounter (HOSPITAL_COMMUNITY): Payer: Self-pay | Admitting: Physician Assistant

## 2021-09-22 ENCOUNTER — Inpatient Hospital Stay (HOSPITAL_BASED_OUTPATIENT_CLINIC_OR_DEPARTMENT_OTHER): Payer: Medicare Other | Admitting: Physician Assistant

## 2021-09-22 VITALS — BP 115/76 | HR 74 | Temp 96.2°F | Resp 18 | Ht 61.0 in | Wt 192.5 lb

## 2021-09-22 DIAGNOSIS — D508 Other iron deficiency anemias: Secondary | ICD-10-CM | POA: Diagnosis not present

## 2021-09-22 DIAGNOSIS — Z79899 Other long term (current) drug therapy: Secondary | ICD-10-CM | POA: Diagnosis not present

## 2021-09-22 DIAGNOSIS — D696 Thrombocytopenia, unspecified: Secondary | ICD-10-CM

## 2021-09-22 DIAGNOSIS — E559 Vitamin D deficiency, unspecified: Secondary | ICD-10-CM | POA: Diagnosis not present

## 2021-09-22 DIAGNOSIS — E538 Deficiency of other specified B group vitamins: Secondary | ICD-10-CM

## 2021-09-22 DIAGNOSIS — D509 Iron deficiency anemia, unspecified: Secondary | ICD-10-CM | POA: Insufficient documentation

## 2021-09-22 HISTORY — DX: Iron deficiency anemia, unspecified: D50.9

## 2021-09-22 LAB — CBC WITH DIFFERENTIAL/PLATELET
Abs Immature Granulocytes: 0.03 10*3/uL (ref 0.00–0.07)
Basophils Absolute: 0.1 10*3/uL (ref 0.0–0.1)
Basophils Relative: 1 %
Eosinophils Absolute: 0.4 10*3/uL (ref 0.0–0.5)
Eosinophils Relative: 5 %
HCT: 40 % (ref 36.0–46.0)
Hemoglobin: 12.6 g/dL (ref 12.0–15.0)
Immature Granulocytes: 0 %
Lymphocytes Relative: 25 %
Lymphs Abs: 1.7 10*3/uL (ref 0.7–4.0)
MCH: 27.7 pg (ref 26.0–34.0)
MCHC: 31.5 g/dL (ref 30.0–36.0)
MCV: 87.9 fL (ref 80.0–100.0)
Monocytes Absolute: 0.5 10*3/uL (ref 0.1–1.0)
Monocytes Relative: 8 %
Neutro Abs: 4.2 10*3/uL (ref 1.7–7.7)
Neutrophils Relative %: 61 %
Platelets: 107 10*3/uL — ABNORMAL LOW (ref 150–400)
RBC: 4.55 MIL/uL (ref 3.87–5.11)
RDW: 13.6 % (ref 11.5–15.5)
WBC: 6.8 10*3/uL (ref 4.0–10.5)
nRBC: 0 % (ref 0.0–0.2)

## 2021-09-22 NOTE — Patient Instructions (Signed)
[place] at [facility] Discharge Instructions  You were seen today by Tarri Abernethy PA-C for your low platelets and low iron.  Your platelets remain mildly low, but are not at a dangerously low level.  Your iron levels are low, even though you have been taking the iron pill for the past 4 months.  We recommend that you received IV iron infusions x2 doses to improve your iron levels.  We will check your labs and see you back again in 4 months.  LABS: Return in 4 months for repeat labs  TREATMENT: - IV iron (Feraheme) x2 doses - Continue to take your vitamin D, iron pill, and B12 supplements at home.  FOLLOW-UP APPOINTMENT: Office visit in 4 months, after labs   Thank you for choosing [place] at [facility] to provide your oncology and hematology care.  To afford each patient quality time with our provider, please arrive at least 15 minutes before your scheduled appointment time.   If you have a lab appointment with the [facility] please come in thru the Main Entrance and check in at the main information desk.  You need to re-schedule your appointment should you arrive 10 or more minutes late.  We strive to give you quality time with our providers, and arriving late affects you and other patients whose appointments are after yours.  Also, if you no show three or more times for appointments you may be dismissed from the clinic at the providers discretion.     Again, thank you for choosing [facility].  Our hope is that these requests will decrease the amount of time that you wait before being seen by our physicians.       _____________________________________________________________  Should you have questions after your visit to [facility], please contact our office at [phone] and follow the prompts.  Our office hours are 8:00 a.m. and 4:30 p.m. Monday - Friday.  Please note that voicemails left after  4:00 p.m. may not be returned until the following business day.  We are closed weekends and major holidays.  You do have access to a nurse 24-7, just call the main number to the clinic [phone] and do not press any options, hold on the line and a nurse will answer the phone.    For prescription refill requests, have your pharmacy contact our office and allow 72 hours.    Due to Covid, you will need to wear a mask upon entering the hospital. If you do not have a mask, a mask will be given to you at the Main Entrance upon arrival. For doctor visits, patients may have 1 support person age 39 or older with them. For treatment visits, patients can not have anyone with them due to social distancing guidelines and our immunocompromised population.

## 2021-09-23 DIAGNOSIS — M25611 Stiffness of right shoulder, not elsewhere classified: Secondary | ICD-10-CM | POA: Diagnosis not present

## 2021-09-23 DIAGNOSIS — M25511 Pain in right shoulder: Secondary | ICD-10-CM | POA: Diagnosis not present

## 2021-09-23 DIAGNOSIS — M542 Cervicalgia: Secondary | ICD-10-CM | POA: Diagnosis not present

## 2021-09-23 DIAGNOSIS — R531 Weakness: Secondary | ICD-10-CM | POA: Diagnosis not present

## 2021-09-26 ENCOUNTER — Other Ambulatory Visit: Payer: Self-pay

## 2021-09-26 ENCOUNTER — Inpatient Hospital Stay (HOSPITAL_COMMUNITY): Payer: Medicare Other

## 2021-09-26 VITALS — BP 103/67 | HR 74 | Temp 96.8°F | Resp 18

## 2021-09-26 DIAGNOSIS — N1832 Chronic kidney disease, stage 3b: Secondary | ICD-10-CM | POA: Diagnosis not present

## 2021-09-26 DIAGNOSIS — D508 Other iron deficiency anemias: Secondary | ICD-10-CM | POA: Diagnosis not present

## 2021-09-26 DIAGNOSIS — D696 Thrombocytopenia, unspecified: Secondary | ICD-10-CM | POA: Diagnosis not present

## 2021-09-26 DIAGNOSIS — E559 Vitamin D deficiency, unspecified: Secondary | ICD-10-CM | POA: Diagnosis not present

## 2021-09-26 DIAGNOSIS — E1122 Type 2 diabetes mellitus with diabetic chronic kidney disease: Secondary | ICD-10-CM | POA: Diagnosis not present

## 2021-09-26 DIAGNOSIS — D631 Anemia in chronic kidney disease: Secondary | ICD-10-CM | POA: Diagnosis not present

## 2021-09-26 DIAGNOSIS — I129 Hypertensive chronic kidney disease with stage 1 through stage 4 chronic kidney disease, or unspecified chronic kidney disease: Secondary | ICD-10-CM | POA: Diagnosis not present

## 2021-09-26 DIAGNOSIS — Z79899 Other long term (current) drug therapy: Secondary | ICD-10-CM | POA: Diagnosis not present

## 2021-09-26 MED ORDER — SODIUM CHLORIDE 0.9 % IV SOLN
510.0000 mg | Freq: Once | INTRAVENOUS | Status: AC
  Administered 2021-09-26: 510 mg via INTRAVENOUS
  Filled 2021-09-26: qty 510

## 2021-09-26 MED ORDER — ACETAMINOPHEN 325 MG PO TABS
650.0000 mg | ORAL_TABLET | Freq: Once | ORAL | Status: AC
  Administered 2021-09-26: 650 mg via ORAL
  Filled 2021-09-26: qty 2

## 2021-09-26 MED ORDER — LORATADINE 10 MG PO TABS
10.0000 mg | ORAL_TABLET | Freq: Once | ORAL | Status: AC
  Administered 2021-09-26: 10 mg via ORAL
  Filled 2021-09-26: qty 1

## 2021-09-26 MED ORDER — SODIUM CHLORIDE 0.9 % IV SOLN: Freq: Once | INTRAVENOUS | Status: AC

## 2021-09-26 NOTE — Progress Notes (Signed)
Pt presents today for Feraheme per provider's order. Vital signs stable and pt voiced new complaints at this time.  Peripheral IV started with good blood return pre and post infusion.   Treatment given today per MD orders. Tolerated infusion without adverse affects. Vital signs stable. No complaints at this time. Discharged from clinic ambulatory in stable condition. Alert and oriented x 3. F/U with [facility] as scheduled.

## 2021-09-26 NOTE — Patient Instructions (Signed)
[facility]  Discharge Instructions: Thank you for choosing [facility] to provide your oncology and hematology care.  If you have a lab appointment with the Cancer Center, please come in thru the Main Entrance and check in at the main information desk.  Wear comfortable clothing and clothing appropriate for easy access to any Portacath or PICC line.   We strive to give you quality time with your provider. You may need to reschedule your appointment if you arrive late (15 or more minutes).  Arriving late affects you and other patients whose appointments are after yours.  Also, if you miss three or more appointments without notifying the office, you may be dismissed from the clinic at the provider's discretion.      For prescription refill requests, have your pharmacy contact our office and allow 72 hours for refills to be completed.    Today you received Feraheme IV iron infusion.     BELOW ARE SYMPTOMS THAT SHOULD BE REPORTED IMMEDIATELY: *FEVER GREATER THAN 100.4 F (38 C) OR HIGHER *CHILLS OR SWEATING *NAUSEA AND VOMITING THAT IS NOT CONTROLLED WITH YOUR NAUSEA MEDICATION *UNUSUAL SHORTNESS OF BREATH *UNUSUAL BRUISING OR BLEEDING *URINARY PROBLEMS (pain or burning when urinating, or frequent urination) *BOWEL PROBLEMS (unusual diarrhea, constipation, pain near the anus) TENDERNESS IN MOUTH AND THROAT WITH OR WITHOUT PRESENCE OF ULCERS (sore throat, sores in mouth, or a toothache) UNUSUAL RASH, SWELLING OR PAIN  UNUSUAL VAGINAL DISCHARGE OR ITCHING   Items with * indicate a potential emergency and should be followed up as soon as possible or go to the Emergency Department if any problems should occur.  Please show the CHEMOTHERAPY ALERT CARD or IMMUNOTHERAPY ALERT CARD at check-in to the Emergency Department and triage nurse.  Should you have questions after your visit or need to cancel or reschedule your appointment, please contact [facility]  [phone]  and follow the prompts.  Office hours are 8:00 a.m. to 4:30 p.m. Monday - Friday. Please note that voicemails left after 4:00 p.m. may not be returned until the following business day.  We are closed weekends and major holidays. You have access to a nurse at all times for urgent questions. Please call the main number to the clinic [phone] and follow the prompts.  For any non-urgent questions, you may also contact your provider using MyChart. We now offer e-Visits for anyone 18 and older to request care online for non-urgent symptoms. For details visit [email or web address].   Also download the MyChart app! Go to the app store, search "MyChart", open the app, select [facility], and log in with your MyChart username and password.  Due to Covid, a mask is required upon entering the hospital/clinic. If you do not have a mask, one will be given to you upon arrival. For doctor visits, patients may have 1 support person aged 18 or older with them. For treatment visits, patients cannot have anyone with them due to current Covid guidelines and our immunocompromised population.  

## 2021-09-28 DIAGNOSIS — R531 Weakness: Secondary | ICD-10-CM | POA: Diagnosis not present

## 2021-09-28 DIAGNOSIS — M25511 Pain in right shoulder: Secondary | ICD-10-CM | POA: Diagnosis not present

## 2021-09-28 DIAGNOSIS — M25611 Stiffness of right shoulder, not elsewhere classified: Secondary | ICD-10-CM | POA: Diagnosis not present

## 2021-09-28 DIAGNOSIS — M542 Cervicalgia: Secondary | ICD-10-CM | POA: Diagnosis not present

## 2021-09-30 DIAGNOSIS — M542 Cervicalgia: Secondary | ICD-10-CM | POA: Diagnosis not present

## 2021-09-30 DIAGNOSIS — R531 Weakness: Secondary | ICD-10-CM | POA: Diagnosis not present

## 2021-09-30 DIAGNOSIS — M25611 Stiffness of right shoulder, not elsewhere classified: Secondary | ICD-10-CM | POA: Diagnosis not present

## 2021-09-30 DIAGNOSIS — M25511 Pain in right shoulder: Secondary | ICD-10-CM | POA: Diagnosis not present

## 2021-10-03 ENCOUNTER — Other Ambulatory Visit: Payer: Self-pay

## 2021-10-03 ENCOUNTER — Inpatient Hospital Stay (HOSPITAL_COMMUNITY): Payer: Medicare Other

## 2021-10-03 VITALS — BP 102/68 | HR 73 | Temp 97.1°F | Resp 18 | Ht 61.0 in | Wt 190.4 lb

## 2021-10-03 DIAGNOSIS — D508 Other iron deficiency anemias: Secondary | ICD-10-CM | POA: Diagnosis not present

## 2021-10-03 DIAGNOSIS — E559 Vitamin D deficiency, unspecified: Secondary | ICD-10-CM | POA: Diagnosis not present

## 2021-10-03 DIAGNOSIS — Z79899 Other long term (current) drug therapy: Secondary | ICD-10-CM | POA: Diagnosis not present

## 2021-10-03 DIAGNOSIS — D696 Thrombocytopenia, unspecified: Secondary | ICD-10-CM | POA: Diagnosis not present

## 2021-10-03 MED ORDER — ACETAMINOPHEN 325 MG PO TABS
650.0000 mg | ORAL_TABLET | Freq: Once | ORAL | Status: AC
  Administered 2021-10-03: 14:00:00 650 mg via ORAL
  Filled 2021-10-03: qty 2

## 2021-10-03 MED ORDER — SODIUM CHLORIDE 0.9 % IV SOLN
510.0000 mg | Freq: Once | INTRAVENOUS | Status: AC
  Administered 2021-10-03: 15:00:00 510 mg via INTRAVENOUS
  Filled 2021-10-03: qty 510

## 2021-10-03 MED ORDER — LORATADINE 10 MG PO TABS
10.0000 mg | ORAL_TABLET | Freq: Once | ORAL | Status: AC
  Administered 2021-10-03: 14:00:00 10 mg via ORAL
  Filled 2021-10-03: qty 1

## 2021-10-03 MED ORDER — SODIUM CHLORIDE 0.9 % IV SOLN: Freq: Once | INTRAVENOUS | Status: AC

## 2021-10-03 NOTE — Progress Notes (Signed)
Pt presents today for Feraheme IV iron per provider's order. Vital signs stable and pt voiced no new complaints at this time.  Peripheral IV started with good blood return pre and post infusion.  Feraheme IV iron infusion given today per MD orders. Tolerated infusion without adverse affects. Vital signs stable. No complaints at this time. Discharged from clinic ambulatory in stable condition. Alert and oriented x 3. F/U with [facility] as scheduled.

## 2021-10-03 NOTE — Patient Instructions (Signed)
Barbara House  Discharge Instructions: Thank you for choosing [facility] to provide your oncology and hematology care.  If you have a lab appointment with the [facility], please come in thru the Main Entrance and check in at the main information desk.  Wear comfortable clothing and clothing appropriate for easy access to any Portacath or PICC line.   We strive to give you quality time with your provider. You may need to reschedule your appointment if you arrive late (15 or more minutes).  Arriving late affects you and other patients whose appointments are after yours.  Also, if you miss three or more appointments without notifying the office, you may be dismissed from the clinic at the providers discretion.      For prescription refill requests, have your pharmacy contact our office and allow 72 hours for refills to be completed.    Today you received the Feraheme IV iron infusion.     BELOW ARE SYMPTOMS THAT SHOULD BE REPORTED IMMEDIATELY: *FEVER GREATER THAN 100.4 F (38 C) OR HIGHER *CHILLS OR SWEATING *NAUSEA AND VOMITING THAT IS NOT CONTROLLED WITH YOUR NAUSEA MEDICATION *UNUSUAL SHORTNESS OF BREATH *UNUSUAL BRUISING OR BLEEDING *URINARY PROBLEMS (pain or burning when urinating, or frequent urination) *BOWEL PROBLEMS (unusual diarrhea, constipation, pain near the anus) TENDERNESS IN MOUTH AND THROAT WITH OR WITHOUT PRESENCE OF ULCERS (sore throat, sores in mouth, or a toothache) UNUSUAL RASH, SWELLING OR PAIN  UNUSUAL VAGINAL DISCHARGE OR ITCHING   Items with * indicate a potential emergency and should be followed up as soon as possible or go to the Emergency Department if any problems should occur.  Please show the CHEMOTHERAPY ALERT CARD or IMMUNOTHERAPY ALERT CARD at check-in to the Emergency Department and triage nurse.  Should you have questions after your visit or need to cancel or reschedule your appointment, please contact [facility] [phone]  and follow the prompts.  Office hours are 8:00 a.m. to 4:30 p.m. Monday - Friday. Please note that voicemails left after 4:00 p.m. may not be returned until the following business day.  We are closed weekends and major holidays. You have access to a nurse at all times for urgent questions. Please call the main number to the clinic [phone] and follow the prompts.  For any non-urgent questions, you may also contact your provider using MyChart. We now offer e-Visits for anyone 63 and older to request care online for non-urgent symptoms. For details visit [email or web address].   Also download the MyChart app! Go to the app store, search "MyChart", open the app, select [facility], and log in with your MyChart username and password.  Due to Covid, a mask is required upon entering the hospital/clinic. If you do not have a mask, one will be given to you upon arrival. For doctor visits, patients may have 1 support person aged 14 or older with them. For treatment visits, patients cannot have anyone with them due to current Covid guidelines and our immunocompromised population.

## 2021-10-05 ENCOUNTER — Telehealth: Payer: Self-pay | Admitting: Podiatry

## 2021-10-05 ENCOUNTER — Encounter: Payer: Self-pay | Admitting: Orthopaedic Surgery

## 2021-10-05 ENCOUNTER — Ambulatory Visit (INDEPENDENT_AMBULATORY_CARE_PROVIDER_SITE_OTHER): Payer: Medicare Other | Admitting: Orthopaedic Surgery

## 2021-10-05 DIAGNOSIS — G8929 Other chronic pain: Secondary | ICD-10-CM

## 2021-10-05 DIAGNOSIS — M542 Cervicalgia: Secondary | ICD-10-CM | POA: Diagnosis not present

## 2021-10-05 DIAGNOSIS — M25511 Pain in right shoulder: Secondary | ICD-10-CM

## 2021-10-05 NOTE — Telephone Encounter (Signed)
Pt left message asking about her diabetic shoes if they were in.  Upon checking we did get the documents but they were signed by a different doctor and that does not meet medicare guidelines. I did refax it today as looking in chart seen pt seen Dr Mannie Stabile in November.  I told pt I would recommend she call the office and let them know to look out for it.

## 2021-10-05 NOTE — Progress Notes (Signed)
I am a little better.  She has been going to PT for the right shoulder and neck pian.  She is slowly improving. She has had more pain with the very cold weather. She has no numbness, no new trauma.  ROM of the right shoulder is full but has pain in the extremes.  NV intact. ROM of the neck is full but she has pain over the right upper trapezius, no spasm.  Encounter Diagnoses  Name Primary?   Neck pain Yes   Chronic right shoulder pain    Continue PT.  Continue exercises at home.  Return in one month.  Call if any problem.  Precautions discussed.  Electronically Signed Sanjuana Kava, MD 12/21/202211:19 AM

## 2021-10-11 DIAGNOSIS — M542 Cervicalgia: Secondary | ICD-10-CM | POA: Diagnosis not present

## 2021-10-11 DIAGNOSIS — R29898 Other symptoms and signs involving the musculoskeletal system: Secondary | ICD-10-CM | POA: Diagnosis not present

## 2021-10-11 DIAGNOSIS — M25511 Pain in right shoulder: Secondary | ICD-10-CM | POA: Diagnosis not present

## 2021-10-11 DIAGNOSIS — M25611 Stiffness of right shoulder, not elsewhere classified: Secondary | ICD-10-CM | POA: Diagnosis not present

## 2021-10-11 DIAGNOSIS — M436 Torticollis: Secondary | ICD-10-CM | POA: Diagnosis not present

## 2021-10-14 DIAGNOSIS — I10 Essential (primary) hypertension: Secondary | ICD-10-CM | POA: Diagnosis not present

## 2021-10-18 DIAGNOSIS — M436 Torticollis: Secondary | ICD-10-CM | POA: Diagnosis not present

## 2021-10-18 DIAGNOSIS — M25611 Stiffness of right shoulder, not elsewhere classified: Secondary | ICD-10-CM | POA: Diagnosis not present

## 2021-10-18 DIAGNOSIS — M25511 Pain in right shoulder: Secondary | ICD-10-CM | POA: Diagnosis not present

## 2021-10-18 DIAGNOSIS — M542 Cervicalgia: Secondary | ICD-10-CM | POA: Diagnosis not present

## 2021-10-18 DIAGNOSIS — R29898 Other symptoms and signs involving the musculoskeletal system: Secondary | ICD-10-CM | POA: Diagnosis not present

## 2021-10-20 DIAGNOSIS — M436 Torticollis: Secondary | ICD-10-CM | POA: Diagnosis not present

## 2021-10-20 DIAGNOSIS — R29898 Other symptoms and signs involving the musculoskeletal system: Secondary | ICD-10-CM | POA: Diagnosis not present

## 2021-10-20 DIAGNOSIS — M542 Cervicalgia: Secondary | ICD-10-CM | POA: Diagnosis not present

## 2021-10-20 DIAGNOSIS — M25511 Pain in right shoulder: Secondary | ICD-10-CM | POA: Diagnosis not present

## 2021-10-20 DIAGNOSIS — M25611 Stiffness of right shoulder, not elsewhere classified: Secondary | ICD-10-CM | POA: Diagnosis not present

## 2021-10-25 DIAGNOSIS — M79674 Pain in right toe(s): Secondary | ICD-10-CM | POA: Diagnosis not present

## 2021-10-25 DIAGNOSIS — R29898 Other symptoms and signs involving the musculoskeletal system: Secondary | ICD-10-CM | POA: Diagnosis not present

## 2021-10-25 DIAGNOSIS — M542 Cervicalgia: Secondary | ICD-10-CM | POA: Diagnosis not present

## 2021-10-25 DIAGNOSIS — M436 Torticollis: Secondary | ICD-10-CM | POA: Diagnosis not present

## 2021-10-25 DIAGNOSIS — M25611 Stiffness of right shoulder, not elsewhere classified: Secondary | ICD-10-CM | POA: Diagnosis not present

## 2021-10-25 DIAGNOSIS — L602 Onychogryphosis: Secondary | ICD-10-CM | POA: Diagnosis not present

## 2021-10-25 DIAGNOSIS — M79675 Pain in left toe(s): Secondary | ICD-10-CM | POA: Diagnosis not present

## 2021-10-25 DIAGNOSIS — L03031 Cellulitis of right toe: Secondary | ICD-10-CM | POA: Diagnosis not present

## 2021-10-25 DIAGNOSIS — M25511 Pain in right shoulder: Secondary | ICD-10-CM | POA: Diagnosis not present

## 2021-10-25 DIAGNOSIS — L600 Ingrowing nail: Secondary | ICD-10-CM | POA: Diagnosis not present

## 2021-10-25 DIAGNOSIS — B351 Tinea unguium: Secondary | ICD-10-CM | POA: Diagnosis not present

## 2021-10-25 DIAGNOSIS — E1149 Type 2 diabetes mellitus with other diabetic neurological complication: Secondary | ICD-10-CM | POA: Diagnosis not present

## 2021-10-27 DIAGNOSIS — M25511 Pain in right shoulder: Secondary | ICD-10-CM | POA: Diagnosis not present

## 2021-10-27 DIAGNOSIS — R29898 Other symptoms and signs involving the musculoskeletal system: Secondary | ICD-10-CM | POA: Diagnosis not present

## 2021-10-27 DIAGNOSIS — M542 Cervicalgia: Secondary | ICD-10-CM | POA: Diagnosis not present

## 2021-10-27 DIAGNOSIS — M436 Torticollis: Secondary | ICD-10-CM | POA: Diagnosis not present

## 2021-10-27 DIAGNOSIS — M25611 Stiffness of right shoulder, not elsewhere classified: Secondary | ICD-10-CM | POA: Diagnosis not present

## 2021-11-01 DIAGNOSIS — M436 Torticollis: Secondary | ICD-10-CM | POA: Diagnosis not present

## 2021-11-01 DIAGNOSIS — R29898 Other symptoms and signs involving the musculoskeletal system: Secondary | ICD-10-CM | POA: Diagnosis not present

## 2021-11-01 DIAGNOSIS — M25511 Pain in right shoulder: Secondary | ICD-10-CM | POA: Diagnosis not present

## 2021-11-01 DIAGNOSIS — M25611 Stiffness of right shoulder, not elsewhere classified: Secondary | ICD-10-CM | POA: Diagnosis not present

## 2021-11-01 DIAGNOSIS — M542 Cervicalgia: Secondary | ICD-10-CM | POA: Diagnosis not present

## 2021-11-02 ENCOUNTER — Ambulatory Visit (INDEPENDENT_AMBULATORY_CARE_PROVIDER_SITE_OTHER): Payer: Medicare Other | Admitting: Orthopaedic Surgery

## 2021-11-02 ENCOUNTER — Encounter: Payer: Self-pay | Admitting: Orthopaedic Surgery

## 2021-11-02 ENCOUNTER — Other Ambulatory Visit: Payer: Self-pay

## 2021-11-02 DIAGNOSIS — G8929 Other chronic pain: Secondary | ICD-10-CM

## 2021-11-02 DIAGNOSIS — M25511 Pain in right shoulder: Secondary | ICD-10-CM

## 2021-11-02 NOTE — Progress Notes (Signed)
I am a little better  She has been going to PT and is making good progress.  She has less pain and is sleeping better.  She is doing her exercises at home.  She has no new trauma, no numbness.  Right shoulder has near full ROM today but pain in the extremes.  NV intact.  Encounter Diagnosis  Name Primary?   Chronic right shoulder pain Yes   Continue PT and exercises at home.  Return in one month.  Call if any problem.  Precautions discussed.  Electronically Signed Sanjuana Kava, MD 1/18/20239:27 AM

## 2021-11-03 DIAGNOSIS — M436 Torticollis: Secondary | ICD-10-CM | POA: Diagnosis not present

## 2021-11-03 DIAGNOSIS — M542 Cervicalgia: Secondary | ICD-10-CM | POA: Diagnosis not present

## 2021-11-03 DIAGNOSIS — M25611 Stiffness of right shoulder, not elsewhere classified: Secondary | ICD-10-CM | POA: Diagnosis not present

## 2021-11-03 DIAGNOSIS — M25511 Pain in right shoulder: Secondary | ICD-10-CM | POA: Diagnosis not present

## 2021-11-03 DIAGNOSIS — R29898 Other symptoms and signs involving the musculoskeletal system: Secondary | ICD-10-CM | POA: Diagnosis not present

## 2021-11-08 DIAGNOSIS — R29898 Other symptoms and signs involving the musculoskeletal system: Secondary | ICD-10-CM | POA: Diagnosis not present

## 2021-11-08 DIAGNOSIS — M25511 Pain in right shoulder: Secondary | ICD-10-CM | POA: Diagnosis not present

## 2021-11-08 DIAGNOSIS — M542 Cervicalgia: Secondary | ICD-10-CM | POA: Diagnosis not present

## 2021-11-08 DIAGNOSIS — M25611 Stiffness of right shoulder, not elsewhere classified: Secondary | ICD-10-CM | POA: Diagnosis not present

## 2021-11-08 DIAGNOSIS — M436 Torticollis: Secondary | ICD-10-CM | POA: Diagnosis not present

## 2021-11-11 DIAGNOSIS — M542 Cervicalgia: Secondary | ICD-10-CM | POA: Diagnosis not present

## 2021-11-11 DIAGNOSIS — M25611 Stiffness of right shoulder, not elsewhere classified: Secondary | ICD-10-CM | POA: Diagnosis not present

## 2021-11-11 DIAGNOSIS — M25511 Pain in right shoulder: Secondary | ICD-10-CM | POA: Diagnosis not present

## 2021-11-11 DIAGNOSIS — M436 Torticollis: Secondary | ICD-10-CM | POA: Diagnosis not present

## 2021-11-11 DIAGNOSIS — R29898 Other symptoms and signs involving the musculoskeletal system: Secondary | ICD-10-CM | POA: Diagnosis not present

## 2021-11-15 DIAGNOSIS — I10 Essential (primary) hypertension: Secondary | ICD-10-CM | POA: Diagnosis not present

## 2021-11-16 NOTE — Progress Notes (Signed)
Referring Provider: Caren Macadam, MD Primary Care Physician:  Caren Macadam, MD Primary GI Physician: Dr. Abbey Chatters  Chief Complaint  Patient presents with   Constipation    BM's 2-3 times per day    HPI:   Barbara House is a 66 y.o. female with history of chronic GERD, dysphagia with prior esophageal dilation, gastroparesis with documented delayed gastric emptying May 2021, adenomatous colon polyps due for colonoscopy between 2025-2030, chronic left sided abdominal/left flank pain with CT's unrevealing, thrombocytopenia followed by hematology, presenting today for follow-up of dysphagia s/p EGD with chief complaint of intermittent diarrhea.  Last seen in our office 04/21/2021.  Fairly asymptomatic to gastroparesis.  GERD was well controlled on Dexilant 60 mg daily.  She noted ongoing solid food dysphagia following recent EGD in April 2022 with normal-appearing esophagus at that time, but empiric dilation could not be performed due to large amount of residual food in the stomach as she did not follow recommendations for clear liquid diet after 6 PM the day prior.  Plan to schedule repeat EGD with dilation. Also reported ongoing intermittent lower abdominal pain for the last several months occurring about twice a week, sometimes prior to bowel movements or urination.  Recent CT in April 2022 with no significant findings.  Overall, suspected component of IBS.  Recommended UA and trial of IBgard.  EGD 05/17/2021: Normal esophagus s/p empiric dilation with mild resistance at 20 mm due to possible esophageal web, normal examined stomach and duodenum, small hiatal hernia.  Today:  Dysphagia: Resolved.  GERD: Well-controlled on Dexilant 60 mg daily.   Gastroparesis: No N/V or significant early satiety.   Her primary concern today is intermittent diarrhea and stomach "growling".  Reports she usually has 2-3 formed bowel movements daily and this is her baseline.  Over the last year, she has had  intermittent diarrhea as well.  This started after she started Janumet and Rybelsus.  Reports diarrhea was more severe initially, but has improved quite a bit.  She continues with intermittent symptoms 1-2 times a week.  Associated urgency.  During this time, stools will be [place] 6-7.  No BRBPR or melena.  No abdominal pain.  She will have 2-3 loose stools when this occurs and then symptoms resolved.  She has not identified any dietary triggers, but she does eat cereal 1-2 times a week, cheese intermittently, and eats a lot of greens.  No unintentional weight loss.  Past Medical History:  Diagnosis Date   Allergic rhinitis    Anxiety disorder    Arthritis    Gastroparesis    GERD (gastroesophageal reflux disease)    Headache    History of migraine    Hx of adenomatous colonic polyps    Hypertension    Iron deficiency anemia 09/22/2021   Thrombocytopenia (HCC)    Type 2 diabetes mellitus ([place])    Not sure when she was diagnosed, greater than five years.     Past Surgical History:  Procedure Laterality Date   ABDOMINAL HYSTERECTOMY     has ovaries.    BALLOON DILATION N/A 05/17/2021   Procedure: BALLOON DILATION;  Surgeon: [name], DO;  [facility];  Service: Endoscopy;  Laterality: N/A;   BIOPSY  01/31/2021   Procedure: BIOPSY;  Surgeon: [name], DO;  [facility]: [facility];  Service: Endoscopy;;   CARDIAC CATHETERIZATION N/A 08/03/2016   Procedure: Left Heart Cath and Coronary Angiography;  Surgeon: Peter M Martinique, MD;  Location:  [facility];  Service: Cardiovascular;  Laterality: N/A;   COLONOSCOPY WITH PROPOFOL N/A 11/05/2018   Dr. Oneida Alar: int/ext hemorrhoids, 43mm cecal tubular adenoma removed, next TCS in 5-10 years   ESOPHAGOGASTRODUODENOSCOPY (EGD) WITH PROPOFOL N/A 11/05/2018   Dr. Oneida Alar: esophagus was dilated, mild gastritis with benign bx   ESOPHAGOGASTRODUODENOSCOPY (EGD) WITH PROPOFOL N/A 01/31/2021   Surgeon: [name],  DO;  Normal esophagus, large amount of food residue in the stomach, gastritis biopsied (mild chronic gastritis, negative for H. pylori), normal examined duodenum s/p biopsy (benign).   ESOPHAGOGASTRODUODENOSCOPY (EGD) WITH PROPOFOL N/A 05/17/2021   Surgeon: [name], DO; normal esophagus s/p empiric dilation with mild resistance at 20 mm due to possible esophageal web, normal examined stomach and duodenum, small hiatal hernia.   Miinor skin surgery     POLYPECTOMY  11/05/2018   Procedure: POLYPECTOMY;  Surgeon: Danie Binder, MD;  [facility]: [facility];  Service: Endoscopy;;  cecal polyp   SAVORY DILATION N/A 11/05/2018   Procedure: SAVORY DILATION;  Surgeon: Danie Binder, MD;  [facility];  Service: Endoscopy;  Laterality: N/A;    Current Outpatient Medications  Medication Sig Dispense Refill   atorvastatin (LIPITOR) 20 MG tablet Take 20 mg by mouth in the morning.     chlorthalidone (HYGROTON) 25 MG tablet Take 25 mg by mouth every morning.     Cholecalciferol (VITAMIN D3) 125 MCG (5000 UT) CAPS Take 5,000 Units by mouth in the morning.     cycloSPORINE (RESTASIS) 0.05 % ophthalmic emulsion Place 1 drop into both eyes 2 (two) times daily as needed (dry eyes).     dexlansoprazole (DEXILANT) 60 MG capsule TAKE ONE CAPSULE BY MOUTH EVERY MORNING WITH breakfast. (Patient taking differently: Take 60 mg by mouth daily with breakfast.) 90 capsule 3   diclofenac Sodium (VOLTAREN) 1 % GEL Apply 2 g topically 4 (four) times daily. Apply to medial left elbow 150 g 1   FARXIGA 10 MG TABS tablet Take 10 mg by mouth in the morning.     Fluocinolone Acetonide 0.01 % OIL Place 1 application in ear(s) 2 (two) times daily as needed (ear itching).     GE100 BLOOD GLUCOSE TEST test strip CHECK BLOOD SUGAR ONCE DAILY OR AS DIRECTED BY PHYSICIAN     glipiZIDE (GLUCOTROL XL) 10 MG 24 hr tablet Take 10 mg by mouth every morning.     ipratropium (ATROVENT) 0.03 % nasal spray Place 2 sprays  into both nostrils 3 (three) times daily as needed for rhinitis.     losartan (COZAAR) 100 MG tablet Take 100 mg by mouth in the morning.     montelukast (SINGULAIR) 10 MG tablet TAKE 1 TABLET (10 MG TOTAL) BY MOUTH AT BEDTIME AS NEEDED (FOR ALLERGIES.). 90 tablet 1   nystatin cream (MYCOSTATIN) Apply 1 application topically 2 (two) times daily as needed (vaginal itching).     PARoxetine (PAXIL) 40 MG tablet TAKE ONE TABLET BY MOUTH EVERY MORNING. (Patient taking differently: Take 40 mg by mouth in the morning.) 90 tablet 0   Semaglutide (RYBELSUS) 7 MG TABS Take by mouth.     sitaGLIPtin-metformin (JANUMET) 50-1000 MG tablet Take 1 tablet by mouth 2 (two) times daily with a meal.     verapamil (CALAN-SR) 240 MG CR tablet TAKE ONE TABLET BY MOUTH EVERY EVENING. (Patient taking differently: Take 240 mg by mouth in the morning.) 90 tablet 0   vitamin B-12 (CYANOCOBALAMIN) 500 MCG tablet Take 500  mcg by mouth in the morning.     No current facility-administered medications for this visit.    Allergies as of 11/17/2021 - Review Complete 11/17/2021  Allergen Reaction Noted   Prednisone Other (See Comments) 09/15/2016    Family History  Problem Relation Age of Onset   Hypertension Mother    Diabetes Mother    Hypertension Son    Diabetes Son    Cancer Father    Cirrhosis Brother    Alcohol abuse Brother    Colon cancer Neg Hx     Social History   Socioeconomic History   Marital status: Single    Spouse name: Not on file   Number of children: 1   Years of education: Not on file   Highest education level: Not on file  Occupational History   Not on file  Tobacco Use   Smoking status: Never   Smokeless tobacco: Never  Vaping Use   Vaping Use: Never used  Substance and Sexual Activity   Alcohol use: No    Alcohol/week: 0.0 standard drinks   Drug use: No   Sexual activity: Not Currently    Birth control/protection: Surgical  Other Topics Concern   Not on file  Social History  Narrative   Reports is on disability. Has one son. Lives in [place]. Enjoys time with family. Enjoys Firefighter. Attends church.    Social Determinants of Health   Financial Resource Strain: Low Risk    Difficulty of Paying Living Expenses: Not very hard  Food Insecurity: No Food Insecurity   Worried About Charity fundraiser in the Last Year: Never true   Ran Out of Food in the Last Year: Never true  Transportation Needs: No Transportation Needs   Lack of Transportation (Medical): No   Lack of Transportation (Non-Medical): No  Physical Activity: Insufficiently Active   Days of Exercise per Week: 3 days   Minutes of Exercise per Session: 20 min  Stress: Stress Concern Present   Feeling of Stress : To some extent  Social Connections: Moderately Isolated   Frequency of Communication with Friends and Family: More than three times a week   Frequency of Social Gatherings with Friends and Family: More than three times a week   Attends Religious Services: More than 4 times per year   Active Member of [employer] or Organizations: No   Attends Music therapist: Not on file   Marital Status: Never married    Review of Systems: Gen: Denies fever, chills, cold or flulike symptoms, presyncope, syncope. GI: See HPI Heme: See HPI  Physical Exam: BP 110/66    Pulse 71    Temp (!) 97.1 F (36.2 C)    Ht 5\' 1"  (1.549 m)    Wt 189 lb 9.6 oz (86 kg)    BMI 35.82 kg/m  General:   Alert and oriented. No distress noted. Pleasant and cooperative.  Head:  Normocephalic and atraumatic. Eyes:  Conjuctiva clear without scleral icterus. Abdomen:  +BS, soft, non-tender and non-distended. No rebound or guarding. No HSM or masses noted. Msk:  Symmetrical without gross deformities. Normal posture. Extremities:  Without edema. Neurologic:  Alert and  oriented x4 Psych:  Normal mood and affect.   Assessment: 66 year old female with history of GERD, dysphagia with prior esophageal dilations,  gastroparesis with documented delayed gastric emptying May 2021, adenomatous colon polyps due for colonoscopy between 2025-2030, presenting today for follow-up of dysphagia s/p EGD and complaint of intermittent diarrhea.  Dysphagia:  Resolved s/p EGD completed 05/17/2021 revealing normal esophagus s/p empiric dilation with mild resistance at 20 mm due to possible esophageal web, normal examined stomach and duodenum, small hiatal hernia.  GERD: Well-controlled with Dexilant 60 mg daily.  Gastroparesis:  Asymptomatic.   Intermittent diarrhea: New onset diarrhea after starting Rybelsus and Janumet sometime last year.  Initially, diarrhea was frequent, but now she has loose stools with urgency 1-2 times a week.  Otherwise, she has 2-3 formed stools daily which is her baseline.  No alarm symptoms.  C. difficile and GI pathogen panel in November 2022 negative.  I suspect intermittent diarrhea is likely med effect.  Could have component of lactose intolerance.  Additional considerations include celiac disease, thyroid abnormalities, and EPI.   For now, we will screen for celiac disease with TTG IgA (IgA total previously normal), check thyroid function, try lactose-free diet, and requested patient keep a dietary log.  She can use Imodium as needed for symptomatic relief.  Also encouraged she talk with primary care about her diabetes medications.  If persistent or worsening symptoms, could consider trial of Creon.    Plan: TSH, TTG IgA.  Trial lactose-free diet x2 weeks.  If diarrhea improves, continue following a lactose-free diet or take Lactaid tablets prior to dairy consumption. Keep dietary log of when diarrhea occurs and foods eaten prior to help identify other food triggers. Use Imodium as needed.  Max of 4 Imodium per day. Encouraged patient to talk with PCP about her medications (Janumet and Rybelsus). Continue Dexilant 60 mg daily. Follow-up in 4 months.  Advised to call sooner if  needed.   Aliene Altes, PA-C [facility] 11/17/2021

## 2021-11-17 ENCOUNTER — Ambulatory Visit (INDEPENDENT_AMBULATORY_CARE_PROVIDER_SITE_OTHER): Payer: Medicare Other | Admitting: Gastroenterology

## 2021-11-17 ENCOUNTER — Other Ambulatory Visit (HOSPITAL_COMMUNITY)
Admission: RE | Admit: 2021-11-17 | Discharge: 2021-11-17 | Disposition: A | Discharge status: Home / Self Care | Payer: Medicare Other | Source: Ambulatory Visit | Attending: Gastroenterology | Admitting: Gastroenterology

## 2021-11-17 ENCOUNTER — Telehealth: Payer: Self-pay | Admitting: Podiatry

## 2021-11-17 ENCOUNTER — Other Ambulatory Visit: Payer: Self-pay

## 2021-11-17 ENCOUNTER — Encounter: Payer: Self-pay | Admitting: Gastroenterology

## 2021-11-17 VITALS — BP 110/66 | HR 71 | Temp 97.1°F | Ht 61.0 in | Wt 189.6 lb

## 2021-11-17 DIAGNOSIS — K3184 Gastroparesis: Secondary | ICD-10-CM

## 2021-11-17 DIAGNOSIS — R1319 Other dysphagia: Secondary | ICD-10-CM | POA: Diagnosis not present

## 2021-11-17 DIAGNOSIS — R197 Diarrhea, unspecified: Secondary | ICD-10-CM | POA: Insufficient documentation

## 2021-11-17 DIAGNOSIS — K219 Gastro-esophageal reflux disease without esophagitis: Secondary | ICD-10-CM

## 2021-11-17 LAB — TSH: TSH: 2.911 u[IU]/mL (ref 0.350–4.500)

## 2021-11-17 NOTE — Telephone Encounter (Signed)
Diabetic shoes/inserts in.. called and no vm set up just rings.Marland KitchenMarland Kitchen

## 2021-11-17 NOTE — Patient Instructions (Addendum)
I suspect your intermittent loose stools are secondary to your new diabetes medications (Janumet and Rybelsus).  However, we will check you for celiac disease (an allergy to gluten) and check your thyroid function. Please go to [facility] to have your blood work completed.  I would also like for you to follow a lactose-free diet for the next 2 weeks to see if this helps your diarrhea.  If your diarrhea improves, this suggest that you have lactose intolerance and would need to continue following a lactose-free diet or take Lactaid tablets prior to dairy consumption.  I would also like for you to keep a log of when your diarrhea occurs and what you have eaten prior to this to help identify any other food triggers.  If needed, you can take Imodium as needed.  Take 1-2 tablets after the first loose bowel movement.  You can take 1 imodium after each additional loose stool up to a total of 4 Imodium per day.  Continue Dexilant 60 mg daily.  We will plan to see back in about 4 months.  Please call sooner if needed.  It was good to see you again today!  Aliene Altes, PA-C [facility]

## 2021-11-18 ENCOUNTER — Encounter (HOSPITAL_COMMUNITY): Payer: Self-pay | Admitting: Hematology

## 2021-11-18 LAB — TISSUE TRANSGLUTAMINASE, IGA: Tissue Transglutaminase Ab, IgA: <2 U/mL (ref 0–3)

## 2021-11-23 ENCOUNTER — Ambulatory Visit (INDEPENDENT_AMBULATORY_CARE_PROVIDER_SITE_OTHER): Payer: Medicare Other

## 2021-11-23 ENCOUNTER — Other Ambulatory Visit: Payer: Self-pay

## 2021-11-23 DIAGNOSIS — E119 Type 2 diabetes mellitus without complications: Secondary | ICD-10-CM

## 2021-11-23 DIAGNOSIS — M2042 Other hammer toe(s) (acquired), left foot: Secondary | ICD-10-CM

## 2021-11-23 DIAGNOSIS — M2041 Other hammer toe(s) (acquired), right foot: Secondary | ICD-10-CM

## 2021-11-23 NOTE — Progress Notes (Signed)
SITUATION Reason for Visit: Fitting of Diabetic Shoes & Insoles Patient / Caregiver Report:  Patient is satisfied and reports comfort  OBJECTIVE DATA: Patient History / Diagnosis:     ICD-10-CM   1. Type 2 diabetes mellitus without complication, without long-term current use of insulin (HCC)  E11.9     2. Hammer toes of both feet  M20.41    M20.42       Change in Status:   None  ACTIONS PERFORMED: In-Person Delivery, patient was fit with: - 1x pair [id number] PDAC approved prefabricated Diabetic Shoes: FUXNATFTD 322 8.5W - 3x pair [id number] PDAC approved CAM milled custom diabetic insoles  Shoes and insoles were verified for structural integrity and safety. Patient wore shoes and insoles in office. Skin was inspected and free of areas of concern after wearing shoes and inserts. Shoes and inserts fit properly. Patient / Caregiver provided with ferbal instruction and demonstration regarding donning, doffing, wear, care, proper fit, function, purpose, cleaning, and use of shoes and insoles ' and in all related precautions and risks and benefits regarding shoes and insoles. Patient / Caregiver was instructed to wear properly fitting socks with shoes at all times. Patient was also provided with verbal instruction regarding how to report any failures or malfunctions of shoes or inserts, and necessary follow up care. Patient / Caregiver was also instructed to contact physician regarding change in status that may affect function of shoes and inserts.   Patient / Caregiver verbalized undersatnding of instruction provided. Patient / Caregiver demonstrated independence with proper donning and doffing of shoes and inserts.  PLAN Patient to follow up as needed. Plan of care was discussed with and agreed upon by patient and/or caregiver. All questions were answered and concerns addressed.

## 2021-11-28 DIAGNOSIS — Z0389 Encounter for observation for other suspected diseases and conditions ruled out: Secondary | ICD-10-CM | POA: Diagnosis not present

## 2021-11-28 DIAGNOSIS — N393 Stress incontinence (female) (male): Secondary | ICD-10-CM | POA: Diagnosis not present

## 2021-11-28 DIAGNOSIS — N200 Calculus of kidney: Secondary | ICD-10-CM | POA: Diagnosis not present

## 2021-11-30 ENCOUNTER — Other Ambulatory Visit: Payer: Self-pay

## 2021-11-30 ENCOUNTER — Encounter: Payer: Self-pay | Admitting: Orthopaedic Surgery

## 2021-11-30 ENCOUNTER — Ambulatory Visit (INDEPENDENT_AMBULATORY_CARE_PROVIDER_SITE_OTHER): Payer: Medicare Other | Admitting: Orthopaedic Surgery

## 2021-11-30 DIAGNOSIS — M25511 Pain in right shoulder: Secondary | ICD-10-CM | POA: Diagnosis not present

## 2021-11-30 DIAGNOSIS — G8929 Other chronic pain: Secondary | ICD-10-CM

## 2021-11-30 NOTE — Progress Notes (Signed)
I am much better.  She has no pain in the right shoulder now.  She has been doing her exercises at home.  She has full ROM of both shoulders, slight pain in the extremes on the right.  NV intact.  Encounter Diagnosis  Name Primary?   Chronic right shoulder pain Yes   I will see as needed.  Call if any problem.  Precautions discussed.  Electronically Signed Sanjuana Kava, MD 2/15/20239:45 AM

## 2021-12-01 DIAGNOSIS — F419 Anxiety disorder, unspecified: Secondary | ICD-10-CM | POA: Diagnosis not present

## 2021-12-01 DIAGNOSIS — E782 Mixed hyperlipidemia: Secondary | ICD-10-CM | POA: Diagnosis not present

## 2021-12-01 DIAGNOSIS — E118 Type 2 diabetes mellitus with unspecified complications: Secondary | ICD-10-CM | POA: Diagnosis not present

## 2021-12-01 DIAGNOSIS — I10 Essential (primary) hypertension: Secondary | ICD-10-CM | POA: Diagnosis not present

## 2021-12-13 DIAGNOSIS — I10 Essential (primary) hypertension: Secondary | ICD-10-CM | POA: Diagnosis not present

## 2022-01-09 ENCOUNTER — Emergency Department (HOSPITAL_COMMUNITY): Payer: Medicare Other

## 2022-01-09 ENCOUNTER — Other Ambulatory Visit: Payer: Self-pay

## 2022-01-09 ENCOUNTER — Emergency Department (HOSPITAL_COMMUNITY)
Admission: EM | Admit: 2022-01-09 | Discharge: 2022-01-09 | Disposition: A | Discharge status: Home / Self Care | Payer: Medicare Other | Attending: Emergency Medicine | Admitting: Emergency Medicine

## 2022-01-09 ENCOUNTER — Encounter (HOSPITAL_COMMUNITY): Payer: Self-pay | Admitting: Emergency Medicine

## 2022-01-09 DIAGNOSIS — E119 Type 2 diabetes mellitus without complications: Secondary | ICD-10-CM | POA: Insufficient documentation

## 2022-01-09 DIAGNOSIS — N1832 Chronic kidney disease, stage 3b: Secondary | ICD-10-CM | POA: Diagnosis not present

## 2022-01-09 DIAGNOSIS — M7989 Other specified soft tissue disorders: Secondary | ICD-10-CM | POA: Diagnosis not present

## 2022-01-09 DIAGNOSIS — M79662 Pain in left lower leg: Secondary | ICD-10-CM | POA: Diagnosis not present

## 2022-01-09 DIAGNOSIS — M79605 Pain in left leg: Secondary | ICD-10-CM | POA: Diagnosis not present

## 2022-01-09 DIAGNOSIS — L03116 Cellulitis of left lower limb: Secondary | ICD-10-CM | POA: Diagnosis not present

## 2022-01-09 DIAGNOSIS — N189 Chronic kidney disease, unspecified: Secondary | ICD-10-CM | POA: Diagnosis not present

## 2022-01-09 DIAGNOSIS — Z7984 Long term (current) use of oral hypoglycemic drugs: Secondary | ICD-10-CM | POA: Insufficient documentation

## 2022-01-09 DIAGNOSIS — E1122 Type 2 diabetes mellitus with diabetic chronic kidney disease: Secondary | ICD-10-CM | POA: Diagnosis not present

## 2022-01-09 MED ORDER — CEPHALEXIN 500 MG PO CAPS: 500.0000 mg | ORAL_CAPSULE | Freq: Two times a day (BID) | ORAL | 0 refills | Status: AC

## 2022-01-09 NOTE — ED Triage Notes (Signed)
Pt to the ED with complaints of left knee pain after a piece of wood hit it on 01/02/22. ? ?

## 2022-01-09 NOTE — Discharge Instructions (Signed)
Your imaging exam was reassuring, I am concerned he might have a small infection on your left leg yop starting on antibiotics please take as prescribed.  We have drawn a circle around the infection if after 3 days times of using the antibiotics the redness is going past that line I like to come back in for reevaluation.  Please use over-the-counter pain medication as needed for pain.  Like Tylenol. ? ?Please follow with your PCP as needed. ? ?Come back to the emergency department if you develop chest pain, shortness of breath, severe abdominal pain, uncontrolled nausea, vomiting, diarrhea. ? ?

## 2022-01-09 NOTE — ED Provider Notes (Signed)
?[facility] ?Provider Note ? ? ?CSN: [id number] ?Arrival date & time: 01/09/22  1004 ? ?  ? ?History ? ?Chief Complaint  ?Patient presents with  ? Knee Pain  ? ? ?Barbara House is a 66 y.o. female. ? ?HPI ? ?Patient with medical history including GERD, type 2 diabetes, headaches, presents with complaint of left leg pain.  Patient states that 10 days ago she was trying to chop a piece of wood and the wood bounced off and hit her left shin.  He states since then she has been having left leg pain, states she feels pain on her shin and goes all way up her leg and down into her toes, states she has pain with ambulation, has some swelling around the area, she denies any paresthesias or weakness, able to move her toes ankle knee and hip.  She states it feels warm to the touch does no skin changes, she has no associate fevers or chills, she denies any breakage in the skin, not had anything for pain. ? ?Home Medications ?Prior to Admission medications   ?Medication Sig Start Date End Date Taking? Authorizing Provider  ?cephALEXin (KEFLEX) 500 MG capsule Take 1 capsule (500 mg total) by mouth 2 (two) times daily for 7 days. 01/09/22 01/16/22 Yes Marcello Fennel, PA-C  ?atorvastatin (LIPITOR) 20 MG tablet Take 20 mg by mouth in the morning.    [provider]  ?chlorthalidone (HYGROTON) 25 MG tablet Take 25 mg by mouth every morning. 05/25/20   [provider]  ?Cholecalciferol (VITAMIN D3) 125 MCG (5000 UT) CAPS Take 5,000 Units by mouth in the morning. 03/16/21   [provider]  ?cycloSPORINE (RESTASIS) 0.05 % ophthalmic emulsion Place 1 drop into both eyes 2 (two) times daily as needed (dry eyes).    [provider]  ?dexlansoprazole (DEXILANT) 60 MG capsule TAKE ONE CAPSULE BY MOUTH EVERY MORNING WITH breakfast. ?Patient taking differently: Take 60 mg by mouth daily with breakfast. 04/28/21   Mahala Menghini, PA-C  ?diclofenac Sodium (VOLTAREN) 1 % GEL Apply 2 g  topically 4 (four) times daily. Apply to medial left elbow 11/09/20   Mordecai Rasmussen, MD  ?FARXIGA 10 MG TABS tablet Take 10 mg by mouth in the morning. 05/25/20   [provider]  ?Fluocinolone Acetonide 0.01 % OIL Place 1 application in ear(s) 2 (two) times daily as needed (ear itching). 06/28/20   [provider]  ?PY099 BLOOD GLUCOSE TEST test strip CHECK BLOOD SUGAR ONCE DAILY OR AS DIRECTED BY PHYSICIAN 05/07/20   [provider]  ?glipiZIDE (GLUCOTROL XL) 10 MG 24 hr tablet Take 10 mg by mouth every morning. 05/10/20   [provider]  ?ipratropium (ATROVENT) 0.03 % nasal spray Place 2 sprays into both nostrils 3 (three) times daily as needed for rhinitis. 06/28/20   [provider]  ?losartan (COZAAR) 100 MG tablet Take 100 mg by mouth in the morning.    [provider]  ?montelukast (SINGULAIR) 10 MG tablet TAKE 1 TABLET (10 MG TOTAL) BY MOUTH AT BEDTIME AS NEEDED (FOR ALLERGIES.). 09/22/19   Kennith Gain, MD  ?nystatin cream (MYCOSTATIN) Apply 1 application topically 2 (two) times daily as needed (vaginal itching). 05/25/20   [provider]  ?PARoxetine (PAXIL) 40 MG tablet TAKE ONE TABLET BY MOUTH EVERY MORNING. ?Patient taking differently: Take 40 mg by mouth in the morning. 01/01/18   Caren Macadam, MD  ?Semaglutide (RYBELSUS) 7 MG TABS Take by  mouth.    [provider]  ?sitaGLIPtin-metformin (JANUMET) 50-1000 MG tablet Take 1 tablet by mouth 2 (two) times daily with a meal.    [provider]  ?verapamil (CALAN-SR) 240 MG CR tablet TAKE ONE TABLET BY MOUTH EVERY EVENING. ?Patient taking differently: Take 240 mg by mouth in the morning. 06/24/18   Caren Macadam, MD  ?vitamin B-12 (CYANOCOBALAMIN) 500 MCG tablet Take 500 mcg by mouth in the morning.    [provider]  ?   ? ?Allergies    ?Prednisone   ? ?Review of Systems   ?Review of Systems  ?Constitutional:  Negative for chills and fever.  ?Respiratory:   Negative for shortness of breath.   ?Cardiovascular:  Negative for chest pain.  ?Gastrointestinal:  Negative for abdominal pain.  ?Musculoskeletal:   ?     Left leg pain  ?Neurological:  Negative for headaches.  ? ?Physical Exam ?Updated Vital Signs ?BP 107/69 (BP Location: Right Arm)   Pulse 64   Temp 98.6 ?F (37 ?C) (Oral)   Resp 18   Ht '5\' 1"'$  (1.549 m)   Wt 86 kg   SpO2 98%   BMI 35.82 kg/m?  ?Physical Exam ?Vitals and nursing note reviewed.  ?Constitutional:   ?   General: She is not in acute distress. ?   Appearance: Normal appearance. She is not ill-appearing or diaphoretic.  ?HENT:  ?   Head: Normocephalic and atraumatic.  ?   Nose: No congestion or rhinorrhea.  ?Eyes:  ?   Conjunctiva/sclera: Conjunctivae normal.  ?Cardiovascular:  ?   Rate and Rhythm: Normal rate and regular rhythm.  ?Pulmonary:  ?   Effort: Pulmonary effort is normal.  ?Musculoskeletal:  ?   Cervical back: Neck supple.  ?   Right lower leg: No edema.  ?   Left lower leg: No edema.  ?   Comments: Lower extremities were visualized she had slight erythema and edema on the anterior aspect of the left shin, no obvious breakage in the skin, there is no unilateral leg swelling, she is tender to palpation along her tibia, with slight left calf tenderness no palpable cords.  She has full range of motion her toes ankle knee and hips bilaterally.  2+ dorsal pedal pulses neurovascular fully intact.  ?Skin: ?   General: Skin is warm and dry.  ?   Coloration: Skin is not jaundiced or pale.  ?Neurological:  ?   Mental Status: She is alert and oriented to person, place, and time.  ?Psychiatric:     ?   Mood and Affect: Mood normal.  ? ? ?ED Results / Procedures / Treatments   ?Labs ?(all labs ordered are listed, but only abnormal results are displayed) ?Labs Reviewed - No data to display ? ?EKG ?None ? ?Radiology ?DG Tibia/Fibula Left ? ?Result Date: 01/09/2022 ?CLINICAL DATA:  Anterior tibial tenderness. Leg swelling. Left knee pain after a piece  of wood hit on 01/02/2022. EXAM: LEFT TIBIA AND FIBULA - 2 VIEW COMPARISON:  None. FINDINGS: There is no evidence of fracture or other focal bone lesions. Soft tissues are unremarkable. No radiopaque foreign body. IMPRESSION: Negative. Electronically Signed   By: Keane Police D.O.   On: 01/09/2022 11:56  ? ?US Venous Img Lower Unilateral Left (DVT) ? ?Result Date: 01/09/2022 ?CLINICAL DATA:  Left lower extremity pain since 01/02/2022, reportedly the leg was struck by piece of wood. EXAM: LEFT LOWER EXTREMITY VENOUS DOPPLER ULTRASOUND TECHNIQUE: Gray-scale sonography with compression, as well  as color and duplex ultrasound, were performed to evaluate the deep venous system(s) from the level of the common femoral vein through the popliteal and proximal calf veins. COMPARISON:  None. FINDINGS: VENOUS Normal compressibility of the common femoral, superficial femoral, and popliteal veins, as well as the visualized calf veins. Visualized portions of profunda femoral vein and great saphenous vein unremarkable. No filling defects to suggest DVT on grayscale or color Doppler imaging. Doppler waveforms show normal direction of venous flow, normal respiratory plasticity and response to augmentation. Limited views of the contralateral common femoral vein are unremarkable. OTHER None. Limitations: none IMPRESSION: 1. No findings of left lower extremity DVT. Electronically Signed   By: Van Clines M.D.   On: 01/09/2022 13:02   ? ?Procedures ?Procedures  ? ? ?Medications Ordered in ED ?Medications - No data to display ? ?ED Course/ Medical Decision Making/ A&P ?  ?                        ?Medical Decision Making ?Amount and/or Complexity of Data Reviewed ?Radiology: ordered. ? ?Risk ?Prescription drug management. ? ? ?This patient presents to the ED for concern of left leg pain, this involves an extensive number of treatment options, and is a complaint that carries with it a high risk of complications and morbidity.  The  differential diagnosis includes fracture, dislocation, cellulitis, DVT ? ? ? ?Additional history obtained: ? ?Additional history obtained from N/A ?External records from outside source obtained and reviewed including

## 2022-01-11 NOTE — ED Notes (Signed)
This nurse attempted to return the patient's phone call without success. Unable to leave a voicemail. ? ?
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
## 2022-01-13 DIAGNOSIS — I10 Essential (primary) hypertension: Secondary | ICD-10-CM | POA: Diagnosis not present

## 2022-01-16 DIAGNOSIS — N1832 Chronic kidney disease, stage 3b: Secondary | ICD-10-CM | POA: Diagnosis not present

## 2022-01-16 DIAGNOSIS — D696 Thrombocytopenia, unspecified: Secondary | ICD-10-CM | POA: Diagnosis not present

## 2022-01-16 DIAGNOSIS — D631 Anemia in chronic kidney disease: Secondary | ICD-10-CM | POA: Diagnosis not present

## 2022-01-16 DIAGNOSIS — E1122 Type 2 diabetes mellitus with diabetic chronic kidney disease: Secondary | ICD-10-CM | POA: Diagnosis not present

## 2022-01-16 DIAGNOSIS — E559 Vitamin D deficiency, unspecified: Secondary | ICD-10-CM | POA: Diagnosis not present

## 2022-01-16 DIAGNOSIS — I129 Hypertensive chronic kidney disease with stage 1 through stage 4 chronic kidney disease, or unspecified chronic kidney disease: Secondary | ICD-10-CM | POA: Diagnosis not present

## 2022-01-19 ENCOUNTER — Inpatient Hospital Stay (HOSPITAL_COMMUNITY): Payer: Medicare Other | Attending: Hematology

## 2022-01-19 DIAGNOSIS — D508 Other iron deficiency anemias: Secondary | ICD-10-CM

## 2022-01-19 DIAGNOSIS — Z79899 Other long term (current) drug therapy: Secondary | ICD-10-CM | POA: Insufficient documentation

## 2022-01-19 DIAGNOSIS — E611 Iron deficiency: Secondary | ICD-10-CM | POA: Insufficient documentation

## 2022-01-19 DIAGNOSIS — E559 Vitamin D deficiency, unspecified: Secondary | ICD-10-CM | POA: Diagnosis not present

## 2022-01-19 DIAGNOSIS — D696 Thrombocytopenia, unspecified: Secondary | ICD-10-CM | POA: Insufficient documentation

## 2022-01-19 DIAGNOSIS — Z7984 Long term (current) use of oral hypoglycemic drugs: Secondary | ICD-10-CM | POA: Insufficient documentation

## 2022-01-19 DIAGNOSIS — E538 Deficiency of other specified B group vitamins: Secondary | ICD-10-CM

## 2022-01-19 LAB — CBC WITH DIFFERENTIAL/PLATELET
Abs Immature Granulocytes: 0.03 10*3/uL (ref 0.00–0.07)
Basophils Absolute: 0.1 10*3/uL (ref 0.0–0.1)
Basophils Relative: 1 %
Eosinophils Absolute: 0.4 10*3/uL (ref 0.0–0.5)
Eosinophils Relative: 5 %
HCT: 41.8 % (ref 36.0–46.0)
Hemoglobin: 13.4 g/dL (ref 12.0–15.0)
Immature Granulocytes: 0 %
Lymphocytes Relative: 25 %
Lymphs Abs: 1.8 10*3/uL (ref 0.7–4.0)
MCH: 29.3 pg (ref 26.0–34.0)
MCHC: 32.1 g/dL (ref 30.0–36.0)
MCV: 91.3 fL (ref 80.0–100.0)
Monocytes Absolute: 0.5 10*3/uL (ref 0.1–1.0)
Monocytes Relative: 7 %
Neutro Abs: 4.3 10*3/uL (ref 1.7–7.7)
Neutrophils Relative %: 62 %
Platelets: 110 10*3/uL — ABNORMAL LOW (ref 150–400)
RBC: 4.58 MIL/uL (ref 3.87–5.11)
RDW: 13.5 % (ref 11.5–15.5)
WBC: 7 10*3/uL (ref 4.0–10.5)
nRBC: 0 % (ref 0.0–0.2)

## 2022-01-19 LAB — IRON AND TIBC
Iron: 75 ug/dL (ref 28–170)
Saturation Ratios: 19 % (ref 10.4–31.8)
TIBC: 398 ug/dL (ref 250–450)
UIBC: 323 ug/dL

## 2022-01-19 LAB — VITAMIN B12: Vitamin B-12: 805 pg/mL (ref 180–914)

## 2022-01-19 LAB — FERRITIN: Ferritin: 209 ng/mL (ref 11–307)

## 2022-01-19 LAB — VITAMIN D 25 HYDROXY (VIT D DEFICIENCY, FRACTURES): Vit D, 25-Hydroxy: 61.21 ng/mL (ref 30–100)

## 2022-01-20 DIAGNOSIS — J301 Allergic rhinitis due to pollen: Secondary | ICD-10-CM | POA: Diagnosis not present

## 2022-01-22 LAB — METHYLMALONIC ACID, SERUM: Methylmalonic Acid, Quantitative: 180 nmol/L (ref 0–378)

## 2022-01-25 NOTE — Progress Notes (Signed)
? ?[facility] ?[address] ?[place], [place] [address] ? ? ?CLINIC:  ?Medical Oncology/Hematology ? ?PCP:  ?Caren Macadam, MD ?[address] ?[place] [address] ?[phone] ? ? ?REASON FOR VISIT:  ?Follow-up for thrombocytopenia and iron deficiency ? ?PRIOR THERAPY: Oral iron ? ?CURRENT THERAPY: Intermittent IV iron ? ?INTERVAL HISTORY:  ?[name] 66 y.o. female returns for routine follow-up of her thrombocytopenia and iron deficiency.  She was last seen by Tarri Abernethy PA-C on 09/22/2021. ? ?At today's visit, she reports feeling well.  No recent hospitalizations, surgeries, or changes in baseline health status.  She reports that she had some improved energy after her IV iron in December.  She has not noticed any bright red blood per rectum or melena.  She does note that she has had some scant epistaxis after she was started on a nasal spray by her primary care doctor.  She denies any fatigue, pica, restless legs, headaches, chest pain, dyspnea on exertion, lightheadedness, or syncope.  She has not had any unusual bruising or petechial rash.  No B symptoms such as fever, chills, night sweats. ?She has 100% energy and 100% appetite. She has lost 5 pounds since her last appointment due to decreasing the amount of sugary sweets in her diet. ? ? ? ?REVIEW OF SYSTEMS:  ?Review of Systems  ?Constitutional:  Positive for fatigue. Negative for appetite change, chills, diaphoresis, fever and unexpected weight change.  ?HENT:   Negative for lump/mass and nosebleeds.   ?Eyes:  Negative for eye problems.  ?Respiratory:  Negative for cough, hemoptysis and shortness of breath.   ?Cardiovascular:  Positive for chest pain (heartburn). Negative for leg swelling and palpitations.  ?Gastrointestinal:  Positive for diarrhea. Negative for abdominal pain, blood in stool, constipation, nausea and vomiting.  ?Genitourinary:  Negative for hematuria.   ?Skin: Negative.   ?Neurological:  Positive for headaches.  Negative for dizziness and light-headedness.  ?Hematological:  Does not bruise/bleed easily.  ?Psychiatric/Behavioral:  Positive for sleep disturbance.    ? ? ?PAST MEDICAL/SURGICAL HISTORY:  ?Past Medical History:  ?Diagnosis Date  ? Allergic rhinitis   ? Anxiety disorder   ? Arthritis   ? Gastroparesis   ? GERD (gastroesophageal reflux disease)   ? Headache   ? History of migraine   ? Hx of adenomatous colonic polyps   ? Hypertension   ? Iron deficiency anemia 09/22/2021  ? Thrombocytopenia ([place])   ? Type 2 diabetes mellitus ([place])   ? Not sure when she was diagnosed, greater than five years.   ? ?Past Surgical History:  ?Procedure Laterality Date  ? ABDOMINAL HYSTERECTOMY    ? has ovaries.   ? BALLOON DILATION N/A 05/17/2021  ? Procedure: BALLOON DILATION;  Surgeon: [name], DO;  [facility];  Service: Endoscopy;  Laterality: N/A;  ? BIOPSY  01/31/2021  ? Procedure: BIOPSY;  Surgeon: [name], DO;  [facility];  Service: Endoscopy;;  ? CARDIAC CATHETERIZATION N/A 08/03/2016  ? Procedure: Left Heart Cath and Coronary Angiography;  Surgeon: Peter M Martinique, MD;  Location: [facility];  Service: Cardiovascular;  Laterality: N/A;  ? COLONOSCOPY WITH PROPOFOL N/A 11/05/2018  ? Dr. Oneida Alar: int/ext hemorrhoids, 19m cecal tubular adenoma removed, next TCS in 5-10 years  ? ESOPHAGOGASTRODUODENOSCOPY (EGD) WITH PROPOFOL N/A 11/05/2018  ? Dr. FOneida Alar esophagus was dilated, mild gastritis with benign bx  ? ESOPHAGOGASTRODUODENOSCOPY (EGD) WITH PROPOFOL N/A 01/31/2021  ? Surgeon: CEloise Harman  DO;  Normal esophagus, large amount of food residue in the stomach, gastritis biopsied (mild chronic gastritis, negative for H. pylori), normal examined duodenum s/p biopsy (benign).  ? ESOPHAGOGASTRODUODENOSCOPY (EGD) WITH PROPOFOL N/A 05/17/2021  ? Surgeon: [name], DO; normal esophagus s/p empiric dilation with mild resistance at 20 mm due to possible esophageal web,  normal examined stomach and duodenum, small hiatal hernia.  ? Miinor skin surgery    ? POLYPECTOMY  11/05/2018  ? Procedure: POLYPECTOMY;  Surgeon: Danie Binder, MD;  [facility];  Service: Endoscopy;;  cecal polyp  ? SAVORY DILATION N/A 11/05/2018  ? Procedure: SAVORY DILATION;  Surgeon: Danie Binder, MD;  [facility];  Service: Endoscopy;  Laterality: N/A;  ? ? ? ?SOCIAL HISTORY:  ?Social History  ? ?Socioeconomic History  ? Marital status: Single  ?  Spouse name: Not on file  ? Number of children: 1  ? Years of education: Not on file  ? Highest education level: Not on file  ?Occupational History  ? Not on file  ?Tobacco Use  ? Smoking status: Never  ? Smokeless tobacco: Never  ?Vaping Use  ? Vaping Use: Never used  ?Substance and Sexual Activity  ? Alcohol use: No  ?  Alcohol/week: 0.0 standard drinks  ? Drug use: No  ? Sexual activity: Not Currently  ?  Birth control/protection: Surgical  ?Other Topics Concern  ? Not on file  ?Social History Narrative  ? Reports is on disability. Has one son. Lives in [place]. Enjoys time with family. Enjoys Firefighter. Attends church.   ? ?Social Determinants of Health  ? ?Financial Resource Strain: Not on file  ?Food Insecurity: Not on file  ?Transportation Needs: Not on file  ?Physical Activity: Not on file  ?Stress: Not on file  ?Social Connections: Not on file  ?Intimate Partner Violence: Not on file  ? ? ?FAMILY HISTORY:  ?Family History  ?Problem Relation Age of Onset  ? Hypertension Mother   ? Diabetes Mother   ? Hypertension Son   ? Diabetes Son   ? Cancer Father   ? Cirrhosis Brother   ? Alcohol abuse Brother   ? Colon cancer Neg Hx   ? ? ?CURRENT MEDICATIONS:  ?Outpatient Encounter Medications as of 01/26/2022  ?Medication Sig  ? atorvastatin (LIPITOR) 20 MG tablet Take 20 mg by mouth in the morning.  ? chlorthalidone (HYGROTON) 25 MG tablet Take 25 mg by mouth every morning.  ? Cholecalciferol (VITAMIN D3) 125 MCG (5000 UT) CAPS Take 5,000  Units by mouth in the morning.  ? cycloSPORINE (RESTASIS) 0.05 % ophthalmic emulsion Place 1 drop into both eyes 2 (two) times daily as needed (dry eyes).  ? dexlansoprazole (DEXILANT) 60 MG capsule TAKE ONE CAPSULE BY MOUTH EVERY MORNING WITH breakfast. (Patient taking differently: Take 60 mg by mouth daily with breakfast.)  ? diclofenac Sodium (VOLTAREN) 1 % GEL Apply 2 g topically 4 (four) times daily. Apply to medial left elbow  ? FARXIGA 10 MG TABS tablet Take 10 mg by mouth in the morning.  ? Fluocinolone Acetonide 0.01 % OIL Place 1 application in ear(s) 2 (two) times daily as needed (ear itching).  ? GE100 BLOOD GLUCOSE TEST test strip CHECK BLOOD SUGAR ONCE DAILY OR AS DIRECTED BY PHYSICIAN  ? glipiZIDE (GLUCOTROL XL) 10 MG 24 hr tablet Take 10 mg by mouth every morning.  ? ipratropium (ATROVENT) 0.03 % nasal spray Place 2 sprays into both nostrils 3 (three)  times daily as needed for rhinitis.  ? losartan (COZAAR) 100 MG tablet Take 100 mg by mouth in the morning.  ? montelukast (SINGULAIR) 10 MG tablet TAKE 1 TABLET (10 MG TOTAL) BY MOUTH AT BEDTIME AS NEEDED (FOR ALLERGIES.).  ? nystatin cream (MYCOSTATIN) Apply 1 application topically 2 (two) times daily as needed (vaginal itching).  ? PARoxetine (PAXIL) 40 MG tablet TAKE ONE TABLET BY MOUTH EVERY MORNING. (Patient taking differently: Take 40 mg by mouth in the morning.)  ? Semaglutide (RYBELSUS) 7 MG TABS Take by mouth.  ? sitaGLIPtin-metformin (JANUMET) 50-1000 MG tablet Take 1 tablet by mouth 2 (two) times daily with a meal.  ? verapamil (CALAN-SR) 240 MG CR tablet TAKE ONE TABLET BY MOUTH EVERY EVENING. (Patient taking differently: Take 240 mg by mouth in the morning.)  ? vitamin B-12 (CYANOCOBALAMIN) 500 MCG tablet Take 500 mcg by mouth in the morning.  ? ?No facility-administered encounter medications on file as of 01/26/2022.  ? ? ?ALLERGIES:  ?Allergies  ?Allergen Reactions  ? Prednisone Other (See Comments)  ?  Hair loss  ? ? ? ?PHYSICAL EXAM:   ?ECOG PERFORMANCE STATUS: 1 - Symptomatic but completely ambulatory ? ?There were no vitals filed for this visit. ?There were no vitals filed for this visit. ?Physical Exam ?Constitutional:   ?   [place]

## 2022-01-26 ENCOUNTER — Inpatient Hospital Stay (HOSPITAL_BASED_OUTPATIENT_CLINIC_OR_DEPARTMENT_OTHER): Payer: Medicare Other | Admitting: Physician Assistant

## 2022-01-26 ENCOUNTER — Encounter (HOSPITAL_COMMUNITY): Payer: Self-pay | Admitting: Hematology

## 2022-01-26 DIAGNOSIS — E611 Iron deficiency: Secondary | ICD-10-CM | POA: Diagnosis not present

## 2022-01-26 DIAGNOSIS — E559 Vitamin D deficiency, unspecified: Secondary | ICD-10-CM

## 2022-01-26 DIAGNOSIS — Z7984 Long term (current) use of oral hypoglycemic drugs: Secondary | ICD-10-CM | POA: Diagnosis not present

## 2022-01-26 DIAGNOSIS — Z79899 Other long term (current) drug therapy: Secondary | ICD-10-CM | POA: Diagnosis not present

## 2022-01-26 DIAGNOSIS — D696 Thrombocytopenia, unspecified: Secondary | ICD-10-CM | POA: Diagnosis not present

## 2022-01-26 DIAGNOSIS — D508 Other iron deficiency anemias: Secondary | ICD-10-CM | POA: Diagnosis not present

## 2022-01-26 NOTE — Patient Instructions (Signed)
[facility] at [facility] Discharge Instructions  You were seen today by Alane Hanssen PA-C for your low platelets and anemia (low red blood cells).  LOW PLATELETS: Your low platelets are stable at their baseline.  We will continue to watch these, but there is no treatment we need to give you for them right now.  IRON DEFICIENCY ANEMIA: Your blood and iron levels looked great!  LABS: Return in 6 months for repeat labs  FOLLOW-UP APPOINTMENT: Office visit in 6 months.   Thank you for choosing [facility] at [facility] to provide your oncology and hematology care.  To afford each patient quality time with our provider, please arrive at least 15 minutes before your scheduled appointment time.   If you have a lab appointment with the Cancer Center please come in thru the Main Entrance and check in at the main information desk.  You need to re-schedule your appointment should you arrive 10 or more minutes late.  We strive to give you quality time with our providers, and arriving late affects you and other patients whose appointments are after yours.  Also, if you no show three or more times for appointments you may be dismissed from the clinic at the providers discretion.     Again, thank you for choosing [facility].  Our hope is that these requests will decrease the amount of time that you wait before being seen by our physicians.       _____________________________________________________________  Should you have questions after your visit to [facility], please contact our office at [phone] and follow the prompts.  Our office hours are 8:00 a.m. and 4:30 p.m. Monday - Friday.  Please note that voicemails left after 4:00 p.m. may not be returned until the following business day.  We are closed weekends and major holidays.  You do have access to a nurse 24-7, just call the main number to the clinic [phone] and  do not press any options, hold on the line and a nurse will answer the phone.    For prescription refill requests, have your pharmacy contact our office and allow 72 hours.    Due to Covid, you will need to wear a mask upon entering the hospital. If you do not have a mask, a mask will be given to you at the Main Entrance upon arrival. For doctor visits, patients may have 1 support person age 18 or older with them. For treatment visits, patients can not have anyone with them due to social distancing guidelines and our immunocompromised population.    

## 2022-02-02 DIAGNOSIS — B351 Tinea unguium: Secondary | ICD-10-CM | POA: Diagnosis not present

## 2022-02-02 DIAGNOSIS — L602 Onychogryphosis: Secondary | ICD-10-CM | POA: Diagnosis not present

## 2022-02-02 DIAGNOSIS — M79675 Pain in left toe(s): Secondary | ICD-10-CM | POA: Diagnosis not present

## 2022-02-02 DIAGNOSIS — L600 Ingrowing nail: Secondary | ICD-10-CM | POA: Diagnosis not present

## 2022-02-02 DIAGNOSIS — E1149 Type 2 diabetes mellitus with other diabetic neurological complication: Secondary | ICD-10-CM | POA: Diagnosis not present

## 2022-02-02 DIAGNOSIS — M79674 Pain in right toe(s): Secondary | ICD-10-CM | POA: Diagnosis not present

## 2022-02-07 ENCOUNTER — Other Ambulatory Visit (HOSPITAL_COMMUNITY): Payer: Self-pay | Admitting: Family Medicine

## 2022-02-07 DIAGNOSIS — Z1231 Encounter for screening mammogram for malignant neoplasm of breast: Secondary | ICD-10-CM

## 2022-03-07 DIAGNOSIS — E538 Deficiency of other specified B group vitamins: Secondary | ICD-10-CM | POA: Diagnosis not present

## 2022-03-07 DIAGNOSIS — Z0000 Encounter for general adult medical examination without abnormal findings: Secondary | ICD-10-CM | POA: Diagnosis not present

## 2022-03-07 DIAGNOSIS — E7800 Pure hypercholesterolemia, unspecified: Secondary | ICD-10-CM | POA: Diagnosis not present

## 2022-03-07 DIAGNOSIS — E118 Type 2 diabetes mellitus with unspecified complications: Secondary | ICD-10-CM | POA: Diagnosis not present

## 2022-03-07 DIAGNOSIS — F419 Anxiety disorder, unspecified: Secondary | ICD-10-CM | POA: Diagnosis not present

## 2022-03-07 DIAGNOSIS — I10 Essential (primary) hypertension: Secondary | ICD-10-CM | POA: Diagnosis not present

## 2022-03-07 DIAGNOSIS — E559 Vitamin D deficiency, unspecified: Secondary | ICD-10-CM | POA: Diagnosis not present

## 2022-03-07 DIAGNOSIS — E782 Mixed hyperlipidemia: Secondary | ICD-10-CM | POA: Diagnosis not present

## 2022-03-07 DIAGNOSIS — K219 Gastro-esophageal reflux disease without esophagitis: Secondary | ICD-10-CM | POA: Diagnosis not present

## 2022-03-15 DIAGNOSIS — I10 Essential (primary) hypertension: Secondary | ICD-10-CM | POA: Diagnosis not present

## 2022-03-15 DIAGNOSIS — J309 Allergic rhinitis, unspecified: Secondary | ICD-10-CM | POA: Diagnosis not present

## 2022-03-15 DIAGNOSIS — H9202 Otalgia, left ear: Secondary | ICD-10-CM | POA: Diagnosis not present

## 2022-03-15 DIAGNOSIS — R42 Dizziness and giddiness: Secondary | ICD-10-CM | POA: Diagnosis not present

## 2022-04-05 ENCOUNTER — Ambulatory Visit (HOSPITAL_COMMUNITY)
Admission: RE | Admit: 2022-04-05 | Discharge: 2022-04-05 | Disposition: A | Discharge status: Home / Self Care | Payer: Medicare Other | Source: Ambulatory Visit | Attending: Family Medicine | Admitting: Family Medicine

## 2022-04-05 DIAGNOSIS — Z1231 Encounter for screening mammogram for malignant neoplasm of breast: Secondary | ICD-10-CM | POA: Diagnosis not present

## 2022-04-07 DIAGNOSIS — R519 Headache, unspecified: Secondary | ICD-10-CM | POA: Diagnosis not present

## 2022-04-07 DIAGNOSIS — M47812 Spondylosis without myelopathy or radiculopathy, cervical region: Secondary | ICD-10-CM | POA: Diagnosis not present

## 2022-04-07 DIAGNOSIS — H9213 Otorrhea, bilateral: Secondary | ICD-10-CM | POA: Diagnosis not present

## 2022-04-07 DIAGNOSIS — Z0000 Encounter for general adult medical examination without abnormal findings: Secondary | ICD-10-CM | POA: Diagnosis not present

## 2022-04-07 DIAGNOSIS — J3489 Other specified disorders of nose and nasal sinuses: Secondary | ICD-10-CM | POA: Diagnosis not present

## 2022-04-07 DIAGNOSIS — H9203 Otalgia, bilateral: Secondary | ICD-10-CM | POA: Diagnosis not present

## 2022-04-25 DIAGNOSIS — T1490XD Injury, unspecified, subsequent encounter: Secondary | ICD-10-CM | POA: Diagnosis not present

## 2022-04-26 ENCOUNTER — Ambulatory Visit (INDEPENDENT_AMBULATORY_CARE_PROVIDER_SITE_OTHER): Payer: Medicare Other | Admitting: Gastroenterology

## 2022-04-26 ENCOUNTER — Encounter: Payer: Self-pay | Admitting: Gastroenterology

## 2022-04-26 VITALS — BP 111/73 | HR 78 | Temp 97.3°F | Ht 61.0 in | Wt 191.0 lb

## 2022-04-26 DIAGNOSIS — K3184 Gastroparesis: Secondary | ICD-10-CM

## 2022-04-26 DIAGNOSIS — K219 Gastro-esophageal reflux disease without esophagitis: Secondary | ICD-10-CM

## 2022-04-26 DIAGNOSIS — Z86010 Personal history of colonic polyps: Secondary | ICD-10-CM | POA: Diagnosis not present

## 2022-04-26 DIAGNOSIS — K625 Hemorrhage of anus and rectum: Secondary | ICD-10-CM | POA: Diagnosis not present

## 2022-04-26 MED ORDER — DEXLANSOPRAZOLE 60 MG PO CPDR: DELAYED_RELEASE_CAPSULE | ORAL | 3 refills | Status: DC

## 2022-04-26 NOTE — Patient Instructions (Addendum)
Continue Dexilant 52m daily before breakfast.  Continue to monitor your bleeding. If you notice persistent or increased bleeding, blood in the stool, etc, please let me know. Right now you are due your next colonoscopy in 10/2023 but if bleeding persists, we would do it sooner.  Continue to stay as active as possible. The more you move the better you will feel.  Return to our office in six months or call sooner if needed.   Exercise Information for Aging Adults Staying physically active is important as you age. Physical activity and exercise can help in maintaining quality of life, health, physical function, and reducing falls. The four types of exercises that are best for older adults are endurance, strength, balance, and flexibility. Contact your health care provider before you start any exercise routine. Ask your health care provider what activities are safe for you. What are the risks? Risks associated with exercising include: Overdoing it. This may lead to sore muscles or fatigue. Falls. Injuries. Dehydration. How to do these exercises Endurance exercises Endurance (aerobic) exercises raise your breathing rate and heart rate. Increasing your endurance helps you do everyday tasks and stay healthy. By improving the health of your body system that includes your heart, lungs, and blood vessels (circulatory system), you may also delay or prevent diseases such as heart disease, diabetes, and weak bones (osteoporosis). Types of endurance exercises include: Sports. Indoor activities, such as using gym equipment, doing water aerobics, or dancing. Outdoor activities, such as biking or jogging. Tasks around the house, such as gardening, yard work, and heavy household chores like cleaning. Walking, such as hiking or walking around your neighborhood. When doing endurance exercises, make sure you: Are aware of your surroundings. Use safety equipment as directed. Dress in layers when exercising  outdoors. Drink plenty of water to stay well hydrated. Build up endurance slowly. Start with 10 minutes at a time, and gradually build up to doing 30 minutes at a time. Unless your health care provider gave you different instructions, aim to exercise for a total of 150 minutes a week. Spread out that time so you are working on endurance 3 or more days a week. Strength exercises Lifting, pulling, or pushing weights helps to strengthen muscles. Having stronger muscles makes it easier to do everyday activities, such as getting up from a chair, climbing stairs, carrying groceries, and playing with grandchildren. Strength exercises include arm and leg exercises that may be done: With weights. Without weights (using your own body weight). With a resistance band. When doing strength exercises: Move smoothly and steadily. Do not suddenly thrust or jerk the weights, the resistance band, or your body. Start with no weights or with light weights, and gradually add more weight over time. Eventually, aim to use weights that are hard or very hard for you to lift. This means that you are able to do 8 repetitions with the weight, and the last few repetitions are very challenging. Lift or push weights into position for 3 seconds, hold the position for 1 second, and then take 3 seconds to return to your starting position. Breathe out (exhale) during difficult movements, like lifting or pushing weights. Breathe in (inhale) to relax your muscles before the next repetition. Consider alternating arms or legs, especially when you first start strength exercises. Expect some slight muscle soreness after each session. Do strength exercises on 2 or more days a week, for 30 minutes at a time. Avoid exercising the same muscle groups two days in a row. For  example, if you work on your leg muscles one day, work on your arm muscles the next day. When you can do two sets of 10-15 repetitions with a certain weight, increase the amount  of weight. Balance exercises Balance exercises can help to prevent falls. Balance exercises include: Standing on one foot. Heel-to-toe walk. Balance walk. Tai chi. Make sure you have something sturdy to hold onto while doing balance exercises, such as a sturdy chair. As your balance improves, challenge yourself by holding on to the chair with one hand instead of two, and then with no hands. Trying exercises with your eyes closed also challenges your balance, but be sure to have a sturdy surface (like a countertop) close by in case you need it. Do balance exercises as often as you want, or as often as directed by your health care provider. Flexibility exercises  Flexibility exercises improve how far you can bend, straighten, move, or rotate parts of your body (range of motion). These exercises also help you do everyday activities such as getting dressed or reaching for objects. Flexibility exercises include stretching different parts of the body, and they may be done in a standing or seated position or on the floor. When stretching, make sure you: Keep a slight bend in your arms and legs. Avoid completely straightening ("locking") your joints. Do not stretch so far that you feel pain. You should feel a mild stretching feeling. You may try stretching farther as you become more flexible over time. Relax and breathe between stretches. Hold on to something sturdy for balance as needed. Hold each stretch for 10-30 seconds. Repeat each stretch 3-5 times. General safety tips Exercise in well-lit areas. Do not hold your breath during exercises or stretches. Warm up before exercising, and cool down after exercising. This can help prevent injury. Drink plenty of water during exercise or any activity that makes you sweat. If you are not sure if an exercise is safe for you, or you are not sure how to do an exercise, talk with your health care provider. This is especially important if you have had surgery  on muscles, bones, or joints (orthopedic surgery). Where to find more information You can find more information about exercise for older adults from: Your local health department, fitness center, or community center. These facilities may have programs for aging adults. [employer] on Aging: http://kim-miller.com/ National Council on Aging: www.ncoa.org Summary Staying physically active is important as you age. Doing endurance, strength, balance, and flexibility exercises can help in maintaining quality of life, health, physical function, and reducing falls. Make sure to contact your health care provider before you start any exercise routine. Ask your health care provider what activities are safe for you. This information is not intended to replace advice given to you by your health care provider. Make sure you discuss any questions you have with your health care provider. Document Revised: 02/14/2021 Document Reviewed: 02/14/2021 Elsevier Patient Education  [place].

## 2022-04-26 NOTE — Progress Notes (Signed)
GI Office Note    Referring Provider: Caren Macadam, MD Primary Care Physician:  Caren Macadam, MD  Primary Gastroenterologist:  Chief Complaint   Chief Complaint  Patient presents with   Follow-up    States that she noticed a small amount of blood on the toilet tissue a couple times last week. States that she thinks it was due to hemorrhoids. Was having to strain to have a bm.     History of Present Illness   Barbara House is a 66 y.o. female presenting today for follow-up.  Last seen February 2023.  She has a history of chronic GERD, dysphagia with prior esophageal dilation, gastroparesis with documented delayed gastric emptying in May 2021, adenomatous colon polyps due for colonoscopy 2025-2030, chronic left-sided abdominal/left flank plain with CTs unrevealing, thrombocytopenia followed by hematology.  Since her last office visit, she completed TTG IgA, which was unremarkable.  Previously IgA was normal in 2019.  Diarrhea resolved after stopping Janumet.  Back to baseline.  BM 1-3 times, solid/soft. Last week two episodes of blood on the tissue, not in the stool. Was having rectal itching. Uses hemorrhoid cream as needed for itching etc. No abdominal pain now, last week had some abdominal cramping. Heartburn and dysphagia doing well. No N/V.  Continues to have mild left upper quadrant/left flank/back pain which has been occurring well over a year.  Nagging type pain.  Previous CT imaging in 2022 including chest/abdomen/pelvis without source for her pain.  Pain thought to be musculoskeletal in nature possibly related to her back.    EGD April 2022 with normal-appearing esophagus but empiric dilation for history of dysphagia cannot be completed due to large amount of residual food in the stomach.  She did not follow with recommendations for clear liquid diet after 6 PM the day prior.  EGD August 2022: Normal esophagus status post empiric dilation with mild resistance at 20 mm due  to possible esophageal web, normal examined stomach and duodenum, small hiatal hernia.  Colonoscopy January 2020 with internal/external hemorrhoids, 3 mm cecal tubular adenoma removed.  Next colonoscopy in 5 to 10 years.  Medications   Current Outpatient Medications  Medication Sig Dispense Refill   atorvastatin (LIPITOR) 20 MG tablet Take 20 mg by mouth in the morning.     chlorthalidone (HYGROTON) 25 MG tablet Take 25 mg by mouth every morning.     Cholecalciferol (VITAMIN D3) 125 MCG (5000 UT) CAPS Take 5,000 Units by mouth in the morning.     cycloSPORINE (RESTASIS) 0.05 % ophthalmic emulsion Place 1 drop into both eyes 2 (two) times daily as needed (dry eyes).     dexlansoprazole (DEXILANT) 60 MG capsule TAKE ONE CAPSULE BY MOUTH EVERY MORNING WITH breakfast. (Patient taking differently: Take 60 mg by mouth daily with breakfast.) 90 capsule 3   diclofenac Sodium (VOLTAREN) 1 % GEL Apply 2 g topically 4 (four) times daily. Apply to medial left elbow 150 g 1   FARXIGA 10 MG TABS tablet Take 10 mg by mouth in the morning.     Fluocinolone Acetonide 0.01 % OIL Place 1 application in ear(s) 2 (two) times daily as needed (ear itching).     GE100 BLOOD GLUCOSE TEST test strip CHECK BLOOD SUGAR ONCE DAILY OR AS DIRECTED BY PHYSICIAN     glipiZIDE (GLUCOTROL XL) 10 MG 24 hr tablet Take 10 mg by mouth every morning.     ipratropium (ATROVENT) 0.03 % nasal spray Place 2 sprays into both  nostrils 3 (three) times daily as needed for rhinitis.     losartan (COZAAR) 100 MG tablet Take 100 mg by mouth in the morning.     metFORMIN (GLUCOPHAGE-XR) 500 MG 24 hr tablet 1,000 mg 2 (two) times daily.     montelukast (SINGULAIR) 10 MG tablet TAKE 1 TABLET (10 MG TOTAL) BY MOUTH AT BEDTIME AS NEEDED (FOR ALLERGIES.). 90 tablet 1   nystatin cream (MYCOSTATIN) Apply 1 application topically 2 (two) times daily as needed (vaginal itching).     PARoxetine (PAXIL) 40 MG tablet TAKE ONE TABLET BY MOUTH EVERY MORNING.  (Patient taking differently: Take 40 mg by mouth in the morning.) 90 tablet 0   Semaglutide (RYBELSUS) 7 MG TABS Take by mouth.     verapamil (CALAN-SR) 240 MG CR tablet TAKE ONE TABLET BY MOUTH EVERY EVENING. (Patient taking differently: Take 240 mg by mouth in the morning.) 90 tablet 0   vitamin B-12 (CYANOCOBALAMIN) 500 MCG tablet Take 500 mcg by mouth in the morning.     No current facility-administered medications for this visit.    Allergies   Allergies as of 04/26/2022 - Review Complete 04/26/2022  Allergen Reaction Noted   Prednisone Other (See Comments) 09/15/2016      Review of Systems   General: Negative for anorexia, weight loss, fever, chills, fatigue, weakness. ENT: Negative for hoarseness, difficulty swallowing , nasal congestion. CV: Negative for chest pain, angina, palpitations, dyspnea on exertion, peripheral edema.  Respiratory: Negative for dyspnea at rest, dyspnea on exertion, cough, sputum, wheezing.  GI: See history of present illness. GU:  Negative for dysuria, hematuria, urinary incontinence, urinary frequency, nocturnal urination.  Endo: Negative for unusual weight change.     Physical Exam   BP 111/73 (BP Location: Left Arm, Patient Position: Sitting, Cuff Size: Normal)   Pulse 78   Temp (!) 97.3 F (36.3 C) (Temporal)   Ht '5\' 1"'$  (1.549 m)   Wt 191 lb (86.6 kg)   SpO2 97%   BMI 36.09 kg/m    General: Well-nourished, well-developed in no acute distress.  Eyes: No icterus. Mouth: Oropharyngeal mucosa moist and pink , no lesions erythema or exudate. Lungs: Clear to auscultation bilaterally.  Heart: Regular rate and rhythm, no murmurs rubs or gallops.  Abdomen: Bowel sounds are normal, nontender, nondistended, no hepatosplenomegaly or masses,  no abdominal bruits or hernia , no rebound or guarding.  Rectal: External hemorrhoids nonbleeding, nonthrombosed.  Palpable suspected internal hemorrhoids.  Brown stool heme-negative.  Nontender exam.  No mass  appreciated. Extremities: No lower extremity edema. No clubbing or deformities. Neuro: Alert and oriented x 4   Skin: Warm and dry, no jaundice.   Psych: Alert and cooperative, normal mood and affect.  Labs   Lab Results  Component Value Date   TSH 2.911 11/17/2021   Lab Results  Component Value Date   CREATININE 1.57 (H) 08/17/2021   BUN 41 (H) 08/17/2021   NA 139 08/17/2021   K 4.4 08/17/2021   CL 106 08/17/2021   CO2 24 08/17/2021   Lab Results  Component Value Date   ALT 16 08/17/2021   AST 23 08/17/2021   ALKPHOS 66 08/17/2021   BILITOT 0.3 08/17/2021   Lab Results  Component Value Date   WBC 7.0 01/19/2022   HGB 13.4 01/19/2022   HCT 41.8 01/19/2022   MCV 91.3 01/19/2022   PLT 110 (L) 01/19/2022   Lab Results  Component Value Date   IRON 75 01/19/2022   TIBC  398 01/19/2022   FERRITIN 209 01/19/2022   Lab Results  Component Value Date   VITAMINB12 805 01/19/2022    Imaging Studies   MM 3D SCREEN BREAST BILATERAL  Result Date: 04/06/2022 CLINICAL DATA:  Screening. EXAM: DIGITAL SCREENING BILATERAL MAMMOGRAM WITH TOMOSYNTHESIS AND CAD TECHNIQUE: Bilateral screening digital craniocaudal and mediolateral oblique mammograms were obtained. Bilateral screening digital breast tomosynthesis was performed. The images were evaluated with computer-aided detection. COMPARISON:  Previous exam(s). ACR Breast Density Category a: The breast tissue is almost entirely fatty. FINDINGS: There are no findings suspicious for malignancy. IMPRESSION: No mammographic evidence of malignancy. A result letter of this screening mammogram will be mailed directly to the patient. RECOMMENDATION: Screening mammogram in one year. (Code:[id number]-01Y) BI-RADS CATEGORY  1: Negative. Electronically Signed   By: Valentino Saxon M.D.   On: 04/06/2022 12:38    Assessment   GERD: Doing well on Dexilant 60 mg daily.  Gastroparesis: Asymptomatic.  Rectal bleeding: Recent rectal bleeding, 2 episodes  with blood noted on the toilet tissue.  Suspect related to hemorrhoid disease.  We will continue to monitor.  If increased frequency or change, would pursue updating colonoscopy.  History of adenomatous colon polyps: Due for surveillance colonoscopy January 2025.  Intermittent diarrhea: Resolved after stopping Janumet.  PLAN   Dexilant '60mg'$  daily. New refill sent.  Monitor for recurrent or persistent bleeding, blood in the stool or other bowel changes.  Would consider updating colonoscopy if any of these occur. Return to the office in 6 months or sooner if needed.   Laureen Ochs. Bobby Rumpf, [place], [facility]

## 2022-05-04 DIAGNOSIS — L600 Ingrowing nail: Secondary | ICD-10-CM | POA: Diagnosis not present

## 2022-05-04 DIAGNOSIS — L602 Onychogryphosis: Secondary | ICD-10-CM | POA: Diagnosis not present

## 2022-05-04 DIAGNOSIS — R600 Localized edema: Secondary | ICD-10-CM | POA: Diagnosis not present

## 2022-05-04 DIAGNOSIS — E1149 Type 2 diabetes mellitus with other diabetic neurological complication: Secondary | ICD-10-CM | POA: Diagnosis not present

## 2022-05-04 DIAGNOSIS — M79674 Pain in right toe(s): Secondary | ICD-10-CM | POA: Diagnosis not present

## 2022-05-04 DIAGNOSIS — M79675 Pain in left toe(s): Secondary | ICD-10-CM | POA: Diagnosis not present

## 2022-05-04 DIAGNOSIS — B351 Tinea unguium: Secondary | ICD-10-CM | POA: Diagnosis not present

## 2022-06-08 DIAGNOSIS — E782 Mixed hyperlipidemia: Secondary | ICD-10-CM | POA: Diagnosis not present

## 2022-06-08 DIAGNOSIS — F419 Anxiety disorder, unspecified: Secondary | ICD-10-CM | POA: Diagnosis not present

## 2022-06-08 DIAGNOSIS — Z23 Encounter for immunization: Secondary | ICD-10-CM | POA: Diagnosis not present

## 2022-06-08 DIAGNOSIS — K219 Gastro-esophageal reflux disease without esophagitis: Secondary | ICD-10-CM | POA: Diagnosis not present

## 2022-06-08 DIAGNOSIS — E118 Type 2 diabetes mellitus with unspecified complications: Secondary | ICD-10-CM | POA: Diagnosis not present

## 2022-06-08 DIAGNOSIS — I10 Essential (primary) hypertension: Secondary | ICD-10-CM | POA: Diagnosis not present

## 2022-06-23 ENCOUNTER — Encounter (HOSPITAL_COMMUNITY): Payer: Self-pay | Admitting: *Deleted

## 2022-06-23 ENCOUNTER — Emergency Department (HOSPITAL_COMMUNITY)
Admission: EM | Admit: 2022-06-23 | Discharge: 2022-06-23 | Disposition: A | Discharge status: Home / Self Care | Payer: Medicare Other | Attending: Emergency Medicine | Admitting: Emergency Medicine

## 2022-06-23 ENCOUNTER — Other Ambulatory Visit: Payer: Self-pay

## 2022-06-23 ENCOUNTER — Emergency Department (HOSPITAL_COMMUNITY): Payer: Medicare Other

## 2022-06-23 DIAGNOSIS — S46212A Strain of muscle, fascia and tendon of other parts of biceps, left arm, initial encounter: Secondary | ICD-10-CM

## 2022-06-23 DIAGNOSIS — Z79899 Other long term (current) drug therapy: Secondary | ICD-10-CM | POA: Diagnosis not present

## 2022-06-23 DIAGNOSIS — M19032 Primary osteoarthritis, left wrist: Secondary | ICD-10-CM | POA: Diagnosis not present

## 2022-06-23 DIAGNOSIS — Z7984 Long term (current) use of oral hypoglycemic drugs: Secondary | ICD-10-CM | POA: Insufficient documentation

## 2022-06-23 DIAGNOSIS — I10 Essential (primary) hypertension: Secondary | ICD-10-CM | POA: Insufficient documentation

## 2022-06-23 DIAGNOSIS — X58XXXA Exposure to other specified factors, initial encounter: Secondary | ICD-10-CM | POA: Diagnosis not present

## 2022-06-23 DIAGNOSIS — M5030 Other cervical disc degeneration, unspecified cervical region: Secondary | ICD-10-CM | POA: Diagnosis not present

## 2022-06-23 DIAGNOSIS — S46112A Strain of muscle, fascia and tendon of long head of biceps, left arm, initial encounter: Secondary | ICD-10-CM | POA: Diagnosis not present

## 2022-06-23 DIAGNOSIS — M50323 Other cervical disc degeneration at C6-C7 level: Secondary | ICD-10-CM | POA: Diagnosis not present

## 2022-06-23 DIAGNOSIS — E119 Type 2 diabetes mellitus without complications: Secondary | ICD-10-CM | POA: Insufficient documentation

## 2022-06-23 DIAGNOSIS — S59912A Unspecified injury of left forearm, initial encounter: Secondary | ICD-10-CM | POA: Diagnosis not present

## 2022-06-23 DIAGNOSIS — S4992XA Unspecified injury of left shoulder and upper arm, initial encounter: Secondary | ICD-10-CM | POA: Diagnosis present

## 2022-06-23 DIAGNOSIS — M778 Other enthesopathies, not elsewhere classified: Secondary | ICD-10-CM | POA: Diagnosis not present

## 2022-06-23 MED ORDER — ACETAMINOPHEN 325 MG PO TABS
650.0000 mg | ORAL_TABLET | Freq: Once | ORAL | Status: AC
  Administered 2022-06-23: 650 mg via ORAL
  Filled 2022-06-23: qty 2

## 2022-06-23 MED ORDER — DICLOFENAC SODIUM 1.6 % EX GEL
2.0000 g | Freq: Four times a day (QID) | CUTANEOUS | 0 refills | Status: AC | PRN
Start: 2022-06-23 — End: ?

## 2022-06-23 NOTE — Discharge Instructions (Signed)
Your x-rays are reassuring today, your exam and history suggest that you have strained the muscles and possibly the tendons in your left arm as well.  Use the medication prescribed, also recommend taking an arthritis strength Tylenol tablet every 6 hours which can also help with pain relief.  Plan to see Dr. Luna Glasgow if this does not improve your symptoms.

## 2022-06-23 NOTE — ED Provider Notes (Signed)
[facility] EMERGENCY DEPARTMENT Provider Note   CSN: [id number] Arrival date & time: 06/23/22  3875     History  Chief Complaint  Patient presents with   Arm Pain    Barbara House is a 66 y.o. female with a history including GERD, hypertension, type 2 diabetes, iron deficiency anemia, arthritis, history of gastroparesis presenting with left arm pain.  She describes aching pain with movement, especially when she rotates her forearm, with pain that radiates from her mid bicep to her wrist.  She describes history of tendinitis of the right arm last year which this reminds her of - her symptoms began last week after lifting heavy garbage bags full of frozen food into a dumpster.  She also endorses pain that radiates from her neck into the arm with leftward neck rotation which has been present for awhile.  Last saw Dr. Luna Glasgow last year for the right arm issue.  She has had no treatment prior to arrival.  She denies weakness in the arm.  Denies cp, sob, palpitations.         Prior cardiac tests including a left heart cath August 03, 2016 with the following results   The left ventricular systolic function is normal. LV end diastolic pressure is moderately elevated.   1. Normal coronary anatomy 2. Normal LV function 3. Elevated LVEDP  The history is provided by the patient.       Home Medications Prior to Admission medications   Medication Sig Start Date End Date Taking? Authorizing Provider  Diclofenac Sodium 1.6 % GEL Apply 2 g topically 4 (four) times daily as needed. Apply to left elbow and lower bicep region 4 times daily 06/23/22  Yes Derita Michelsen, Almyra Free, PA-C  atorvastatin (LIPITOR) 20 MG tablet Take 20 mg by mouth in the morning.    [provider]  chlorthalidone (HYGROTON) 25 MG tablet Take 25 mg by mouth every morning. 05/25/20   [provider]  Cholecalciferol (VITAMIN D3) 125 MCG (5000 UT) CAPS Take 5,000 Units by mouth in the morning. 03/16/21   [provider]  cycloSPORINE (RESTASIS) 0.05 % ophthalmic emulsion Place 1 drop into both eyes 2 (two) times daily as needed (dry eyes).    [provider]  dexlansoprazole (DEXILANT) 60 MG capsule Take one capsule daily before breakfast. 04/26/22   Mahala Menghini, PA-C  FARXIGA 10 MG TABS tablet Take 10 mg by mouth in the morning. 05/25/20   [provider]  Fluocinolone Acetonide 0.01 % OIL Place 1 application in ear(s) 2 (two) times daily as needed (ear itching). 06/28/20   [provider]  GE100 BLOOD GLUCOSE TEST test strip CHECK BLOOD SUGAR ONCE DAILY OR AS DIRECTED BY PHYSICIAN 05/07/20   [provider]  glipiZIDE (GLUCOTROL XL) 10 MG 24 hr tablet Take 10 mg by mouth every morning. 05/10/20   [provider]  ipratropium (ATROVENT) 0.03 % nasal spray Place 2 sprays into both nostrils 3 (three) times daily as needed for rhinitis. 06/28/20   [provider]  losartan (COZAAR) 100 MG tablet Take 100 mg by mouth in the morning.    [provider]  metFORMIN (GLUCOPHAGE-XR) 500 MG 24 hr tablet 1,000 mg 2 (two) times daily. 11/18/21   [provider]  montelukast (SINGULAIR) 10 MG tablet TAKE 1 TABLET (10 MG TOTAL) BY MOUTH AT BEDTIME AS NEEDED (FOR ALLERGIES.). 09/22/19   Kennith Gain, MD  nystatin cream (MYCOSTATIN) Apply 1 application topically 2 (two) times  daily as needed (vaginal itching). 05/25/20   [provider]  PARoxetine (PAXIL) 40 MG tablet TAKE ONE TABLET BY MOUTH EVERY MORNING. Patient taking differently: Take 40 mg by mouth in the morning. 01/01/18   Caren Macadam, MD  Semaglutide (RYBELSUS) 7 MG TABS Take by mouth.    [provider]  verapamil (CALAN-SR) 240 MG CR tablet TAKE ONE TABLET BY MOUTH EVERY EVENING. Patient taking differently: Take 240 mg by mouth in the morning. 06/24/18   Caren Macadam, MD  vitamin B-12 (CYANOCOBALAMIN) 500 MCG tablet Take 500 mcg by mouth in the morning.     [provider]      Allergies    Prednisone    Review of Systems   Review of Systems  Constitutional:  Negative for chills and fever.  Respiratory:  Negative for shortness of breath.   Cardiovascular:  Negative for chest pain and palpitations.  Musculoskeletal:  Positive for arthralgias and neck pain. Negative for joint swelling and myalgias.  Skin: Negative.  Negative for rash.  Neurological:  Negative for weakness and numbness.  All other systems reviewed and are negative.   Physical Exam Updated Vital Signs BP 137/86 (BP Location: Right Arm)   Pulse 73   Temp (!) 97.5 F (36.4 C) (Oral)   Resp 20   Ht '5\' 1"'$  (1.549 m)   Wt 85.3 kg   SpO2 99%   BMI 35.52 kg/m  Physical Exam Vitals and nursing note reviewed.  Constitutional:      Appearance: She is well-developed.  HENT:     Head: Normocephalic and atraumatic.  Cardiovascular:     Rate and Rhythm: Normal rate and regular rhythm.     Heart sounds: Normal heart sounds.  Pulmonary:     Effort: Pulmonary effort is normal.     Breath sounds: Normal breath sounds.  Musculoskeletal:        General: Tenderness present. No swelling or deformity. Normal range of motion.     Left upper arm: Tenderness present. No swelling or edema.     Cervical back: Normal range of motion.     Comments: Ttp lower left bicep through upper volar forearm.  No edema, no erythema or induration.  Radial pulses full equally.  Equal grip strength.    Skin:    General: Skin is warm and dry.  Neurological:     Mental Status: She is alert.     Motor: Motor function is intact.     Deep Tendon Reflexes:     Reflex Scores:      Tricep reflexes are 2+ on the right side and 2+ on the left side.      Bicep reflexes are 2+ on the right side and 2+ on the left side.    ED Results / Procedures / Treatments   Labs (all labs ordered are listed, but only abnormal results are displayed) Labs Reviewed - No data to  display  EKG None  Radiology DG Cervical Spine Complete  Result Date: 06/23/2022 CLINICAL DATA:  Neck pain EXAM: CERVICAL SPINE - COMPLETE 4 VIEW COMPARISON:  Cervical spine radiograph dated June 17, 2021 FINDINGS: Vertebral body heights are well-maintained. Normal alignment. Moderate degenerative disc disease at C6-C7 and mild multilevel facet arthropathy, unchanged. Soft tissues are unremarkable. IMPRESSION: 1. No acute osseous abnormality. 2. Unchanged moderate degenerative disc disease at C6-C7 and mild multilevel facet arthropathy. Electronically Signed   By: Yetta Glassman M.D.   On: 06/23/2022 10:28   DG Forearm Left  Result Date: 06/23/2022 CLINICAL DATA:  Decreased range of motion of left forearm since lifting injury. EXAM: LEFT FOREARM - 2 VIEW COMPARISON:  Left elbow radiographs 11/09/2020 FINDINGS: Normal bone mineralization. No elbow joint effusion is seen. Minimal chronic enthesopathic change at the triceps insertion on the olecranon. No acute fracture within the radius or ulna. Partially visualized at least moderate thumb carpometacarpal joint space narrowing and peripheral osteophytosis, osteoarthritis. IMPRESSION: No acute fracture. Degenerative changes of the thumb carpometacarpal joint. Electronically Signed   By: Yvonne Kendall M.D.   On: 06/23/2022 10:27    Procedures Procedures    Medications Ordered in ED Medications  acetaminophen (TYLENOL) tablet 650 mg (650 mg Oral Provided for home use 06/23/22 1119)    ED Course/ Medical Decision Making/ A&P                           Medical Decision Making Exam and hx of mechanism suggesting fully MSK source of sx.  Denies sob, denies CP.  Doubt ACS equivalent.    Amount and/or Complexity of Data Reviewed External Data Reviewed: notes.    Details: Cath report 08/03/2016 Radiology: ordered.    Details: Reviewed. Djd thumb, ddd cspine, no acute findings.  Risk OTC drugs. Prescription drug  management.           Final Clinical Impression(s) / ED Diagnoses Final diagnoses:  Strain of left biceps muscle, initial encounter  DDD (degenerative disc disease), cervical    Rx / DC Orders ED Discharge Orders          Ordered    Diclofenac Sodium 1.6 % GEL  4 times daily PRN        06/23/22 1056              Evalee Jefferson, PA-C 06/25/22 0911    Audley Hose, MD 07/02/22 1233

## 2022-06-23 NOTE — ED Triage Notes (Signed)
Pt c/o left arm pain x one week

## 2022-07-14 DIAGNOSIS — Z888 Allergy status to other drugs, medicaments and biological substances status: Secondary | ICD-10-CM | POA: Diagnosis not present

## 2022-07-14 DIAGNOSIS — Z20822 Contact with and (suspected) exposure to covid-19: Secondary | ICD-10-CM | POA: Diagnosis not present

## 2022-07-14 DIAGNOSIS — R21 Rash and other nonspecific skin eruption: Secondary | ICD-10-CM | POA: Diagnosis not present

## 2022-07-28 ENCOUNTER — Inpatient Hospital Stay: Payer: Medicare Other | Attending: Physician Assistant

## 2022-07-28 DIAGNOSIS — Z79899 Other long term (current) drug therapy: Secondary | ICD-10-CM | POA: Insufficient documentation

## 2022-07-28 DIAGNOSIS — D508 Other iron deficiency anemias: Secondary | ICD-10-CM

## 2022-07-28 DIAGNOSIS — D696 Thrombocytopenia, unspecified: Secondary | ICD-10-CM | POA: Diagnosis not present

## 2022-07-28 DIAGNOSIS — D509 Iron deficiency anemia, unspecified: Secondary | ICD-10-CM | POA: Diagnosis not present

## 2022-07-28 DIAGNOSIS — E559 Vitamin D deficiency, unspecified: Secondary | ICD-10-CM | POA: Insufficient documentation

## 2022-07-28 LAB — CBC WITH DIFFERENTIAL/PLATELET
Abs Immature Granulocytes: 0.03 10*3/uL (ref 0.00–0.07)
Basophils Absolute: 0.1 10*3/uL (ref 0.0–0.1)
Basophils Relative: 1 %
Eosinophils Absolute: 0.3 10*3/uL (ref 0.0–0.5)
Eosinophils Relative: 5 %
HCT: 41.2 % (ref 36.0–46.0)
Hemoglobin: 13.1 g/dL (ref 12.0–15.0)
Immature Granulocytes: 1 %
Lymphocytes Relative: 26 %
Lymphs Abs: 1.7 10*3/uL (ref 0.7–4.0)
MCH: 29.2 pg (ref 26.0–34.0)
MCHC: 31.8 g/dL (ref 30.0–36.0)
MCV: 92 fL (ref 80.0–100.0)
Monocytes Absolute: 0.4 10*3/uL (ref 0.1–1.0)
Monocytes Relative: 7 %
Neutro Abs: 3.8 10*3/uL (ref 1.7–7.7)
Neutrophils Relative %: 60 %
Platelets: 90 10*3/uL — ABNORMAL LOW (ref 150–400)
RBC: 4.48 MIL/uL (ref 3.87–5.11)
RDW: 13.2 % (ref 11.5–15.5)
WBC: 6.2 10*3/uL (ref 4.0–10.5)
nRBC: 0 % (ref 0.0–0.2)

## 2022-07-28 LAB — IRON AND TIBC
Iron: 66 ug/dL (ref 28–170)
Saturation Ratios: 17 % (ref 10.4–31.8)
TIBC: 387 ug/dL (ref 250–450)
UIBC: 321 ug/dL

## 2022-07-28 LAB — FERRITIN: Ferritin: 182 ng/mL (ref 11–307)

## 2022-07-28 LAB — VITAMIN D 25 HYDROXY (VIT D DEFICIENCY, FRACTURES): Vit D, 25-Hydroxy: 55.88 ng/mL (ref 30–100)

## 2022-07-31 DIAGNOSIS — I129 Hypertensive chronic kidney disease with stage 1 through stage 4 chronic kidney disease, or unspecified chronic kidney disease: Secondary | ICD-10-CM | POA: Diagnosis not present

## 2022-07-31 DIAGNOSIS — E1122 Type 2 diabetes mellitus with diabetic chronic kidney disease: Secondary | ICD-10-CM | POA: Diagnosis not present

## 2022-07-31 DIAGNOSIS — N1832 Chronic kidney disease, stage 3b: Secondary | ICD-10-CM | POA: Diagnosis not present

## 2022-08-01 DIAGNOSIS — J301 Allergic rhinitis due to pollen: Secondary | ICD-10-CM | POA: Diagnosis not present

## 2022-08-03 DIAGNOSIS — B351 Tinea unguium: Secondary | ICD-10-CM | POA: Diagnosis not present

## 2022-08-03 DIAGNOSIS — M79671 Pain in right foot: Secondary | ICD-10-CM | POA: Diagnosis not present

## 2022-08-03 DIAGNOSIS — L600 Ingrowing nail: Secondary | ICD-10-CM | POA: Diagnosis not present

## 2022-08-03 DIAGNOSIS — M79674 Pain in right toe(s): Secondary | ICD-10-CM | POA: Diagnosis not present

## 2022-08-03 DIAGNOSIS — E1149 Type 2 diabetes mellitus with other diabetic neurological complication: Secondary | ICD-10-CM | POA: Diagnosis not present

## 2022-08-03 DIAGNOSIS — L602 Onychogryphosis: Secondary | ICD-10-CM | POA: Diagnosis not present

## 2022-08-04 ENCOUNTER — Ambulatory Visit: Payer: Medicare Other | Admitting: Physician Assistant

## 2022-08-15 ENCOUNTER — Other Ambulatory Visit: Payer: Self-pay | Admitting: Family Medicine

## 2022-08-15 DIAGNOSIS — R82998 Other abnormal findings in urine: Secondary | ICD-10-CM | POA: Diagnosis not present

## 2022-08-15 DIAGNOSIS — Z23 Encounter for immunization: Secondary | ICD-10-CM | POA: Diagnosis not present

## 2022-08-15 DIAGNOSIS — R911 Solitary pulmonary nodule: Secondary | ICD-10-CM

## 2022-08-15 DIAGNOSIS — R600 Localized edema: Secondary | ICD-10-CM | POA: Diagnosis not present

## 2022-08-15 DIAGNOSIS — I10 Essential (primary) hypertension: Secondary | ICD-10-CM | POA: Diagnosis not present

## 2022-08-17 ENCOUNTER — Encounter (HOSPITAL_COMMUNITY): Payer: Self-pay | Admitting: Hematology

## 2022-08-23 NOTE — Progress Notes (Signed)
[place], [place] [address]   CLINIC:  Medical Oncology/Hematology  PCP:  Caren Macadam, [place] [address] [phone]   REASON FOR VISIT:  Follow-up for thrombocytopenia and iron deficiency  PRIOR THERAPY: Oral iron  CURRENT THERAPY: Intermittent IV iron  INTERVAL HISTORY:  Barbara House 66 y.o. female returns for routine follow-up of her thrombocytopenia and iron deficiency.  She was last seen by Tarri Abernethy PA-C on 01/26/2022.  At today's visit, she reports feeling well.  No recent hospitalizations, surgeries, or changes in baseline health status.  She has not noticed any bright red blood per rectum or melena.  She does note that she has had some scant epistaxis wg\hen she blows her nose.  She denies any fatigue, pica, restless legs, headaches, chest pain, dyspnea on exertion, lightheadedness, or syncope.  She reports easy bruising, but denies any petechial rash. No B symptoms such as fever, chills, night sweats.  She has 80% energy and 100% appetite. Her weight is stable.  REVIEW OF SYSTEMS:  Review of Systems  Constitutional:  Positive for fatigue. Negative for appetite change, chills, diaphoresis, fever and unexpected weight change.  HENT:   Negative for lump/mass and nosebleeds.   Eyes:  Negative for eye problems.  Respiratory:  Positive for cough (sinus drainage). Negative for hemoptysis and shortness of breath.   Cardiovascular:  Negative for chest pain, leg swelling and palpitations.  Gastrointestinal:  Positive for diarrhea. Negative for abdominal pain, blood in stool, constipation, nausea and vomiting.  Genitourinary:  Negative for hematuria.   Musculoskeletal:  Positive for arthralgias.  Skin: Negative.   Neurological:  Positive for dizziness. Negative for headaches and light-headedness.  Hematological:  Does not bruise/bleed easily.  Psychiatric/Behavioral:  Positive for sleep disturbance.        PAST MEDICAL/SURGICAL HISTORY:  Past Medical History:  Diagnosis Date   Allergic rhinitis    Anxiety disorder    Arthritis    Gastroparesis    GERD (gastroesophageal reflux disease)    Headache    History of migraine    Hx of adenomatous colonic polyps    Hypertension    Iron deficiency anemia 09/22/2021   Thrombocytopenia (HCC)    Type 2 diabetes mellitus ([place])    Not sure when she was diagnosed, greater than five years.    Past Surgical History:  Procedure Laterality Date   ABDOMINAL HYSTERECTOMY     has ovaries.    BALLOON DILATION N/A 05/17/2021   Procedure: BALLOON DILATION;  Surgeon: [name], DO;  [facility];  Service: Endoscopy;  Laterality: N/A;   BIOPSY  01/31/2021   Procedure: BIOPSY;  Surgeon: [name], DO;  [facility];  Service: Endoscopy;;   CARDIAC CATHETERIZATION N/A 08/03/2016   Procedure: Left Heart Cath and Coronary Angiography;  Surgeon: Peter M Martinique, MD;  Location: [facility];  Service: Cardiovascular;  Laterality: N/A;   COLONOSCOPY WITH PROPOFOL N/A 11/05/2018   Dr. Oneida Alar: int/ext hemorrhoids, 60m cecal tubular adenoma removed, next TCS in 5-10 years   ESOPHAGOGASTRODUODENOSCOPY (EGD) WITH PROPOFOL N/A 11/05/2018   Dr. FOneida Alar esophagus was dilated, mild gastritis with benign bx   ESOPHAGOGASTRODUODENOSCOPY (EGD) WITH PROPOFOL N/A 01/31/2021   Surgeon: CEloise Harman DO;  Normal esophagus, large amount of food residue in the stomach, gastritis biopsied (mild chronic gastritis, negative for H. pylori), normal examined duodenum s/p biopsy (benign).   ESOPHAGOGASTRODUODENOSCOPY (EGD) WITH PROPOFOL N/A  05/17/2021   Surgeon: [name], DO; normal esophagus s/p empiric dilation with mild resistance at 20 mm due to possible esophageal web, normal examined stomach and duodenum, small hiatal hernia.   Miinor skin surgery     POLYPECTOMY  11/05/2018   Procedure: POLYPECTOMY;  Surgeon: Danie Binder, MD;  [facility];  Service: Endoscopy;;  cecal polyp   SAVORY DILATION N/A 11/05/2018   Procedure: SAVORY DILATION;  Surgeon: Danie Binder, MD;  [facility];  Service: Endoscopy;  Laterality: N/A;     SOCIAL HISTORY:  Social History   Socioeconomic History   Marital status: Single    Spouse name: Not on file   Number of children: 1   Years of education: Not on file   Highest education level: Not on file  Occupational History   Not on file  Tobacco Use   Smoking status: Never   Smokeless tobacco: Never  Vaping Use   Vaping Use: Never used  Substance and Sexual Activity   Alcohol use: No    Alcohol/week: 0.0 standard drinks of alcohol   Drug use: No   Sexual activity: Not Currently    Birth control/protection: Surgical  Other Topics Concern   Not on file  Social History Narrative   Reports is on disability. Has one son. Lives in [place]. Enjoys time with family. Enjoys Firefighter. Attends church.    Social Determinants of Health   Financial Resource Strain: Low Risk  (01/17/2021)   Overall Financial Resource Strain (CARDIA)    Difficulty of Paying Living Expenses: Not very hard  Food Insecurity: No Food Insecurity (01/17/2021)   Hunger Vital Sign    Worried About Running Out of Food in the Last Year: Never true    Ran Out of Food in the Last Year: Never true  Transportation Needs: No Transportation Needs (01/17/2021)   PRAPARE - Hydrologist (Medical): No    Lack of Transportation (Non-Medical): No  Physical Activity: Insufficiently Active (01/17/2021)   Exercise Vital Sign    Days of Exercise per Week: 3 days    Minutes of Exercise per Session: 20 min  Stress: Stress Concern Present (01/17/2021)   [place]    Feeling of Stress : To some extent  Social Connections: Moderately Isolated (01/17/2021)   Social Connection and Isolation Panel [NHANES]     Frequency of Communication with Friends and Family: More than three times a week    Frequency of Social Gatherings with Friends and Family: More than three times a week    Attends Religious Services: More than 4 times per year    Active Member of [employer] or Organizations: No    Attends Music therapist: Not on file    Marital Status: Never married  Intimate Partner Violence: Not At Risk (01/17/2021)   Humiliation, Afraid, Rape, and Kick questionnaire    Fear of Current or Ex-Partner: No    Emotionally Abused: No    Physically Abused: No    Sexually Abused: No    FAMILY HISTORY:  Family History  Problem Relation Age of Onset   Hypertension Mother    Diabetes Mother    Hypertension Son    Diabetes Son    Cancer Father    Cirrhosis Brother    Alcohol abuse Brother    Colon cancer Neg Hx     CURRENT MEDICATIONS:  Outpatient Encounter Medications as of  08/24/2022  Medication Sig   atorvastatin (LIPITOR) 20 MG tablet Take 20 mg by mouth in the morning.   chlorthalidone (HYGROTON) 25 MG tablet Take 25 mg by mouth every morning.   Cholecalciferol (VITAMIN D3) 125 MCG (5000 UT) CAPS Take 5,000 Units by mouth in the morning.   cycloSPORINE (RESTASIS) 0.05 % ophthalmic emulsion Place 1 drop into both eyes 2 (two) times daily as needed (dry eyes).   dexlansoprazole (DEXILANT) 60 MG capsule Take one capsule daily before breakfast.   Diclofenac Sodium 1.6 % GEL Apply 2 g topically 4 (four) times daily as needed. Apply to left elbow and lower bicep region 4 times daily   FARXIGA 10 MG TABS tablet Take 10 mg by mouth in the morning.   Fluocinolone Acetonide 0.01 % OIL Place 1 application in ear(s) 2 (two) times daily as needed (ear itching).   GE100 BLOOD GLUCOSE TEST test strip CHECK BLOOD SUGAR ONCE DAILY OR AS DIRECTED BY PHYSICIAN   glipiZIDE (GLUCOTROL XL) 10 MG 24 hr tablet Take 10 mg by mouth every morning.   ipratropium (ATROVENT) 0.03 % nasal spray Place 2 sprays into both  nostrils 3 (three) times daily as needed for rhinitis.   losartan (COZAAR) 100 MG tablet Take 100 mg by mouth in the morning.   metFORMIN (GLUCOPHAGE-XR) 500 MG 24 hr tablet 1,000 mg 2 (two) times daily.   montelukast (SINGULAIR) 10 MG tablet TAKE 1 TABLET (10 MG TOTAL) BY MOUTH AT BEDTIME AS NEEDED (FOR ALLERGIES.).   nystatin cream (MYCOSTATIN) Apply 1 application topically 2 (two) times daily as needed (vaginal itching).   PARoxetine (PAXIL) 40 MG tablet TAKE ONE TABLET BY MOUTH EVERY MORNING. (Patient taking differently: Take 40 mg by mouth in the morning.)   Semaglutide (RYBELSUS) 7 MG TABS Take by mouth.   verapamil (CALAN-SR) 240 MG CR tablet TAKE ONE TABLET BY MOUTH EVERY EVENING. (Patient taking differently: Take 240 mg by mouth in the morning.)   vitamin B-12 (CYANOCOBALAMIN) 500 MCG tablet Take 500 mcg by mouth in the morning.   No facility-administered encounter medications on file as of 08/24/2022.    ALLERGIES:  Allergies  Allergen Reactions   Prednisone Other (See Comments)    Hair loss     PHYSICAL EXAM:  ECOG PERFORMANCE STATUS: 1 - Symptomatic but completely ambulatory  There were no vitals filed for this visit. There were no vitals filed for this visit. Physical Exam Constitutional:      Appearance: Normal appearance. She is obese.  HENT:     Head: Normocephalic and atraumatic.     Mouth/Throat:     Mouth: Mucous membranes are moist.  Eyes:     Extraocular Movements: Extraocular movements intact.     Pupils: Pupils are equal, round, and reactive to light.  Cardiovascular:     Rate and Rhythm: Normal rate and regular rhythm.     Pulses: Normal pulses.     Heart sounds: Normal heart sounds.  Pulmonary:     Effort: Pulmonary effort is normal.     Breath sounds: Normal breath sounds.  Abdominal:     General: Bowel sounds are normal.     Palpations: Abdomen is soft.     Tenderness: There is no abdominal tenderness.  Musculoskeletal:        General: No  swelling.     Right lower leg: No edema.     Left lower leg: No edema.  Lymphadenopathy:     Cervical: No cervical adenopathy.  Skin:  General: Skin is warm and dry.  Neurological:     General: No focal deficit present.     Mental Status: She is alert and oriented to person, place, and time.  Psychiatric:        Mood and Affect: Mood normal.        Behavior: Behavior normal.      LABORATORY DATA:  I have reviewed the labs as listed.  CBC    Component Value Date/Time   WBC 6.2 07/28/2022 0908   RBC 4.48 07/28/2022 0908   HGB 13.1 07/28/2022 0908   HGB 11.7 06/25/2018 1548   HCT 41.2 07/28/2022 0908   HCT 34.7 06/25/2018 1548   PLT 90 (L) 07/28/2022 0908   PLT 165 06/25/2018 1548   MCV 92.0 07/28/2022 0908   MCV 85 06/25/2018 1548   MCH 29.2 07/28/2022 0908   MCHC 31.8 07/28/2022 0908   RDW 13.2 07/28/2022 0908   RDW 13.5 06/25/2018 1548   LYMPHSABS 1.7 07/28/2022 0908   LYMPHSABS 2.4 06/25/2018 1548   MONOABS 0.4 07/28/2022 0908   EOSABS 0.3 07/28/2022 0908   EOSABS 0.3 06/25/2018 1548   BASOSABS 0.1 07/28/2022 0908   BASOSABS 0.0 06/25/2018 1548      Latest Ref Rng & Units 08/17/2021   10:19 AM 08/05/2021    9:35 AM 06/17/2021   11:04 AM  CMP  Glucose 70 - 99 mg/dL 69   155   BUN 8 - 23 mg/dL 41   26   Creatinine 0.44 - 1.00 mg/dL 1.57  1.40  1.59   Sodium 135 - 145 mmol/L 139   138   Potassium 3.5 - 5.1 mmol/L 4.4   4.3   Chloride 98 - 111 mmol/L 106   100   CO2 22 - 32 mmol/L 24   27   Calcium 8.9 - 10.3 mg/dL 10.0   10.0   Total Protein 6.5 - 8.1 g/dL 7.6     Total Bilirubin 0.3 - 1.2 mg/dL 0.3     Alkaline Phos 38 - 126 U/L 66     AST 15 - 41 U/L 23     ALT 0 - 44 U/L 16       DIAGNOSTIC IMAGING:  I have independently reviewed the relevant imaging and discussed with the patient.  ASSESSMENT & PLAN: 1.  Thrombocytopenia, mild to moderate - Initial work-up unable to determine cause of thrombocytopenia - SPEP was normal, hepatitis panel was  normal, RF and ANA were negative, no overt nutritional deficiencies (normal B12, folate, methylmalonic acid, copper).  Previous testing of hepatitis C, HIV, and H. pylori were negative. - Pathologist smear review on 08/29/2019 showed mild thrombocytopenia, borderline anemia - Normal abdominal ultrasound from 08/15/2017 negative for liver abnormality, spleen was normal in size and appearance - CT abdomen/pelvis (08/17/2021): Normal liver and spleen - Reported having one blood transfusion many years ago, but unsure of reason - Patient is taking vitamin B12 pills  - Platelets have ranged from 80 to 109 since January 2020 - No major bleeding events, no B symptoms   - No abnormal bruising or petechial rash   - Most recent labs (07/28/2022): Platelets 90, at baseline.  Otherwise normal CBC. - Differential diagnosis includes immune thrombocytopenia versus early MDS  - PLAN:  No intervention needed at this time, we will continue to monitor.  RTC in 6 months for repeat labs and follow-up.  Will consider trial of steroids and/or bone marrow biopsy if any significant changes in CBC.  2.  Normocytic anemia (resolved) with iron deficiency state - Patient has had prior history of mild normocytic anemia with hemoglobin ranging from 11-12, possibly secondary to CKD and functional iron deficiency - EGD (05/17/2021): No evidence of bleeding, erosions, ulcerations, or varices - Colonoscopy (11/05/2018): Polyp x1, external and internal hemorrhoids - Currently taking daily B12 tablet daily. - Failed to improve on oral iron supplementation - Received IV Feraheme x2 in December 2022 - Energy improved after IV iron - No bright red blood per rectum or melena - Most recent labs (07/28/2022): Normal Hgb 13.1/MCV 92.0, ferritin 182, iron saturation 17 % with normal TIBC.  B12, methylmalonic acid, and folate normal when checked in April 2023. - PLAN: No indication for IV iron at this time. - RTC in 6 months for follow-up  visit and repeat labs.     3.  Vitamin D deficiency - Hypovitaminosis D evidenced in August 2021 with vitamin D level 23.76  - Currently taking Vitamin D3 cholecalciferol 5,000 units daily - Most recent vitamin D (07/28/2022) is normal at 55.88 - PLAN: Continue vitamin D supplementation.  Check vitamin D in 1 year (~April 2024)   FOLLOW UP PLAN: >> Labs in 6 months (CBC/D, ferritin, iron/TIBC, vitamin D, B12, MMA) >> Office visit 1 week after labs  All questions were answered. The patient knows to call the clinic with any problems, questions or concerns.  Medical decision making: Low  Time spent on visit: I spent 15 minutes counseling the patient face to face. The total time spent in the appointment was 22 minutes and more than 50% was on counseling.   Barbara Rush, PA-C  08/25/22 6:48 AM

## 2022-08-24 ENCOUNTER — Inpatient Hospital Stay: Payer: Medicare Other | Attending: Physician Assistant | Admitting: Physician Assistant

## 2022-08-24 ENCOUNTER — Other Ambulatory Visit: Payer: Self-pay

## 2022-08-24 VITALS — BP 104/72 | HR 77 | Temp 97.4°F | Resp 16 | Ht 61.0 in | Wt 193.1 lb

## 2022-08-24 DIAGNOSIS — I10 Essential (primary) hypertension: Secondary | ICD-10-CM | POA: Diagnosis not present

## 2022-08-24 DIAGNOSIS — E559 Vitamin D deficiency, unspecified: Secondary | ICD-10-CM | POA: Diagnosis not present

## 2022-08-24 DIAGNOSIS — D508 Other iron deficiency anemias: Secondary | ICD-10-CM

## 2022-08-24 DIAGNOSIS — E538 Deficiency of other specified B group vitamins: Secondary | ICD-10-CM | POA: Diagnosis not present

## 2022-08-24 DIAGNOSIS — Z79899 Other long term (current) drug therapy: Secondary | ICD-10-CM | POA: Insufficient documentation

## 2022-08-24 DIAGNOSIS — E119 Type 2 diabetes mellitus without complications: Secondary | ICD-10-CM | POA: Insufficient documentation

## 2022-08-24 DIAGNOSIS — D696 Thrombocytopenia, unspecified: Secondary | ICD-10-CM | POA: Insufficient documentation

## 2022-08-24 DIAGNOSIS — Z7984 Long term (current) use of oral hypoglycemic drugs: Secondary | ICD-10-CM | POA: Insufficient documentation

## 2022-08-24 DIAGNOSIS — D509 Iron deficiency anemia, unspecified: Secondary | ICD-10-CM | POA: Diagnosis not present

## 2022-08-24 NOTE — Patient Instructions (Signed)
[place] at [facility] Discharge Instructions  You were seen today by Tarri Abernethy PA-C for your low platelets and anemia (low red blood cells).  LOW PLATELETS: Your low platelets are stable at their baseline.  We will continue to watch these, but there is no treatment we need to give you for them right now.  IRON DEFICIENCY ANEMIA: Your blood and iron levels looked great!  LABS: Return in 6 months for repeat labs  FOLLOW-UP APPOINTMENT: Office visit in 6 months.   Thank you for choosing [place] at [facility] to provide your oncology and hematology care.  To afford each patient quality time with our provider, please arrive at least 15 minutes before your scheduled appointment time.   If you have a lab appointment with the [facility] please come in thru the Main Entrance and check in at the main information desk.  You need to re-schedule your appointment should you arrive 10 or more minutes late.  We strive to give you quality time with our providers, and arriving late affects you and other patients whose appointments are after yours.  Also, if you no show three or more times for appointments you may be dismissed from the clinic at the providers discretion.     Again, thank you for choosing [facility].  Our hope is that these requests will decrease the amount of time that you wait before being seen by our physicians.       _____________________________________________________________  Should you have questions after your visit to [facility], please contact our office at [phone] and follow the prompts.  Our office hours are 8:00 a.m. and 4:30 p.m. Monday - Friday.  Please note that voicemails left after 4:00 p.m. may not be returned until the following business day.  We are closed weekends and major holidays.  You do have access to a nurse 24-7, just call the main number to the clinic [phone] and  do not press any options, hold on the line and a nurse will answer the phone.    For prescription refill requests, have your pharmacy contact our office and allow 72 hours.    Due to Covid, you will need to wear a mask upon entering the hospital. If you do not have a mask, a mask will be given to you at the Main Entrance upon arrival. For doctor visits, patients may have 1 support person age 48 or older with them. For treatment visits, patients can not have anyone with them due to social distancing guidelines and our immunocompromised population.

## 2022-08-25 ENCOUNTER — Encounter (HOSPITAL_COMMUNITY): Payer: Self-pay | Admitting: Hematology

## 2022-09-05 DIAGNOSIS — Z23 Encounter for immunization: Secondary | ICD-10-CM | POA: Diagnosis not present

## 2022-09-06 DIAGNOSIS — K219 Gastro-esophageal reflux disease without esophagitis: Secondary | ICD-10-CM | POA: Diagnosis not present

## 2022-09-06 DIAGNOSIS — E1143 Type 2 diabetes mellitus with diabetic autonomic (poly)neuropathy: Secondary | ICD-10-CM | POA: Diagnosis not present

## 2022-09-06 DIAGNOSIS — I10 Essential (primary) hypertension: Secondary | ICD-10-CM | POA: Diagnosis not present

## 2022-09-06 DIAGNOSIS — E782 Mixed hyperlipidemia: Secondary | ICD-10-CM | POA: Diagnosis not present

## 2022-09-21 ENCOUNTER — Other Ambulatory Visit: Payer: Medicare Other

## 2022-09-29 ENCOUNTER — Ambulatory Visit
Admission: RE | Admit: 2022-09-29 | Discharge: 2022-09-29 | Disposition: A | Discharge status: Home / Self Care | Payer: Medicare Other | Source: Ambulatory Visit | Attending: Family Medicine | Admitting: Family Medicine

## 2022-09-29 DIAGNOSIS — R911 Solitary pulmonary nodule: Secondary | ICD-10-CM

## 2022-10-03 ENCOUNTER — Other Ambulatory Visit: Payer: Self-pay | Admitting: Family Medicine

## 2022-10-03 DIAGNOSIS — E041 Nontoxic single thyroid nodule: Secondary | ICD-10-CM

## 2022-10-24 ENCOUNTER — Ambulatory Visit
Admission: RE | Admit: 2022-10-24 | Discharge: 2022-10-24 | Disposition: A | Discharge status: Home / Self Care | Payer: Medicare Other | Source: Ambulatory Visit | Attending: Family Medicine | Admitting: Family Medicine

## 2022-10-24 DIAGNOSIS — E041 Nontoxic single thyroid nodule: Secondary | ICD-10-CM | POA: Diagnosis not present

## 2022-10-26 ENCOUNTER — Other Ambulatory Visit: Payer: Self-pay | Admitting: Family Medicine

## 2022-10-26 DIAGNOSIS — E041 Nontoxic single thyroid nodule: Secondary | ICD-10-CM

## 2022-10-27 ENCOUNTER — Ambulatory Visit (INDEPENDENT_AMBULATORY_CARE_PROVIDER_SITE_OTHER): Payer: Medicare Other | Admitting: Gastroenterology

## 2022-10-27 ENCOUNTER — Encounter: Payer: Self-pay | Admitting: Gastroenterology

## 2022-10-27 VITALS — BP 109/71 | HR 73 | Temp 97.8°F | Ht 61.0 in | Wt 192.8 lb

## 2022-10-27 DIAGNOSIS — K219 Gastro-esophageal reflux disease without esophagitis: Secondary | ICD-10-CM | POA: Diagnosis not present

## 2022-10-27 DIAGNOSIS — K3184 Gastroparesis: Secondary | ICD-10-CM | POA: Diagnosis not present

## 2022-10-27 NOTE — Progress Notes (Signed)
GI Office Note    Referring Provider: Caren Macadam, MD Primary Care Physician:  Caren Macadam, MD  Primary Gastroenterologist: Elon Alas. Abbey Chatters, DO   Chief Complaint   Chief Complaint  Patient presents with   Follow-up    States that her reflux is a whole lot better, but that she has abdominal pain every now and then.     History of Present Illness   Barbara House is a 67 y.o. female presenting today for follow-up.  Last seen in July.  She has a history of chronic GERD, dysphagia with prior esophageal dilation, gastroparesis, adenomatous colon polyps due for colonoscopy 2025-2030, chronic left-sided abdominal pain/left flank pain with CTs unrevealing (suspected musculoskeletal in nature), thrombocytopenia followed by hematology.  Overall reflux is well controlled. Dysphagia once, patient thinks she ate too much/too fast. Occasionally feels like going to regurgitate if she bends over. Chronic left flank pain stable. BM good. No melena. No brbpr in awhile. She had toilet tissue hematochezia after episode of diarrhea several months back but no recurrent issues.   CT chest without contrast 09/2022: IMPRESSION: 1. No pulmonary nodules requiring follow-up. 2. 1.5 cm low-attenuation right thyroid nodule. Recommend thyroid ultrasound. (Ref: J Am Coll Radiol. 2015 Feb;12(2): 143-50). 3. Hepatic steatosis.  CT A/P with contrast 08/2021: Unremarkable liver. Spleen normal.  Stable right renal cyst.  EGD April 2022 with normal-appearing esophagus but empiric dilation for history of dysphagia cannot be completed due to large amount of residual food in the stomach.  She did not follow with recommendations for clear liquid diet after 6 PM the day prior.   EGD August 2022: Normal esophagus status post empiric dilation with mild resistance at 20 mm due to possible esophageal web, normal examined stomach and duodenum, small hiatal hernia.   Colonoscopy January 2020 with  internal/external hemorrhoids, 3 mm cecal tubular adenoma removed. Next colonoscopy in 5 to 10 years.   Medications   Current Outpatient Medications  Medication Sig Dispense Refill   atorvastatin (LIPITOR) 20 MG tablet Take 20 mg by mouth in the morning.     chlorthalidone (HYGROTON) 25 MG tablet Take 25 mg by mouth every morning.     Cholecalciferol (VITAMIN D3) 125 MCG (5000 UT) CAPS Take 5,000 Units by mouth in the morning.     cycloSPORINE (RESTASIS) 0.05 % ophthalmic emulsion Place 1 drop into both eyes 2 (two) times daily as needed (dry eyes).     dexlansoprazole (DEXILANT) 60 MG capsule Take one capsule daily before breakfast. 90 capsule 3   Diclofenac Sodium 1.6 % GEL Apply 2 g topically 4 (four) times daily as needed. Apply to left elbow and lower bicep region 4 times daily 50 g 0   FARXIGA 10 MG TABS tablet Take 10 mg by mouth in the morning.     Fluocinolone Acetonide 0.01 % OIL Place 1 application in ear(s) 2 (two) times daily as needed (ear itching).     furosemide (LASIX) 40 MG tablet Take 40 mg by mouth daily as needed.     GE100 BLOOD GLUCOSE TEST test strip CHECK BLOOD SUGAR ONCE DAILY OR AS DIRECTED BY PHYSICIAN     glipiZIDE (GLUCOTROL XL) 10 MG 24 hr tablet Take 10 mg by mouth every morning.     ipratropium (ATROVENT) 0.03 % nasal spray Place 2 sprays into both nostrils 3 (three) times daily as needed for rhinitis.     losartan (COZAAR) 100 MG tablet Take 100 mg by mouth in the  morning.     metFORMIN (GLUCOPHAGE-XR) 500 MG 24 hr tablet 1,000 mg 2 (two) times daily.     montelukast (SINGULAIR) 10 MG tablet TAKE 1 TABLET (10 MG TOTAL) BY MOUTH AT BEDTIME AS NEEDED (FOR ALLERGIES.). 90 tablet 1   nystatin cream (MYCOSTATIN) Apply 1 application topically 2 (two) times daily as needed (vaginal itching).     PARoxetine (PAXIL) 40 MG tablet TAKE ONE TABLET BY MOUTH EVERY MORNING. (Patient taking differently: Take 40 mg by mouth in the morning.) 90 tablet 0   Semaglutide 14 MG  TABS Take by mouth.     verapamil (CALAN-SR) 240 MG CR tablet TAKE ONE TABLET BY MOUTH EVERY EVENING. (Patient taking differently: Take 240 mg by mouth in the morning.) 90 tablet 0   vitamin B-12 (CYANOCOBALAMIN) 500 MCG tablet Take 500 mcg by mouth in the morning.     No current facility-administered medications for this visit.    Allergies   Allergies as of 10/27/2022 - Review Complete 10/27/2022  Allergen Reaction Noted   Prednisone Other (See Comments) 09/15/2016        Review of Systems   General: Negative for anorexia, weight loss, fever, chills, fatigue, weakness. ENT: Negative for hoarseness, difficulty swallowing , nasal congestion. CV: Negative for chest pain, angina, palpitations, dyspnea on exertion, peripheral edema.  Respiratory: Negative for dyspnea at rest, dyspnea on exertion, cough, sputum, wheezing.  GI: See history of present illness. GU:  Negative for dysuria, hematuria, urinary incontinence, urinary frequency, nocturnal urination.  Endo: Negative for unusual weight change.     Physical Exam   BP 109/71 (BP Location: Right Arm, Patient Position: Sitting, Cuff Size: Large)   Pulse 73   Temp 97.8 F (36.6 C) (Oral)   Ht '5\' 1"'$  (1.549 m)   Wt 192 lb 12.8 oz (87.5 kg)   SpO2 98%   BMI 36.43 kg/m    General: Well-nourished, well-developed in no acute distress.  Eyes: No icterus. Mouth: Oropharyngeal mucosa moist and pink   Abdomen: Bowel sounds are normal, nontender, nondistended, no hepatosplenomegaly or masses,  no abdominal bruits or hernia , no rebound or guarding.  Rectal: not performed Extremities:trace bilateral lower extremity edema. No clubbing or deformities. Neuro: Alert and oriented x 4   Skin: Warm and dry, no jaundice.   Psych: Alert and cooperative, normal mood and affect.  Labs   Lab Results  Component Value Date   CREATININE 1.57 (H) 08/17/2021   BUN 41 (H) 08/17/2021   NA 139 08/17/2021   K 4.4 08/17/2021   CL 106 08/17/2021    CO2 24 08/17/2021   Lab Results  Component Value Date   WBC 6.2 07/28/2022   HGB 13.1 07/28/2022   HCT 41.2 07/28/2022   MCV 92.0 07/28/2022   PLT 90 (L) 07/28/2022   Lab Results  Component Value Date   IRON 66 07/28/2022   TIBC 387 07/28/2022   FERRITIN 182 07/28/2022   Lab Results  Component Value Date   VITAMINB12 805 01/19/2022   Lab Results  Component Value Date   FOLATE 13.0 05/12/2021    Imaging Studies   US THYROID  Result Date: 10/24/2022 CLINICAL DATA:  Incidental on CT. EXAM: THYROID ULTRASOUND TECHNIQUE: Ultrasound examination of the thyroid gland and adjacent soft tissues was performed. COMPARISON:  None Available. FINDINGS: Parenchymal Echotexture: Moderately heterogenous Isthmus: 0.3 cm Right lobe: 3.6 x 1.6 x 1.5 cm Left lobe: 2.5 x 1.0 x 1.3 cm _________________________________________________________ Estimated total number of nodules >/=  1 cm: 1 Number of spongiform nodules >/=  2 cm not described below (TR1): 0 Number of mixed cystic and solid nodules >/= 1.5 cm not described below ([place]): 0 _________________________________________________________ Nodule # 1: Location: Right; Inferior Maximum size: 1.5 cm; Other 2 dimensions: 1.2 x 0.9 cm Composition: solid/almost completely solid (2) Echogenicity: hypoechoic (2) Shape: not taller-than-wide (0) Margins: smooth (0) Echogenic foci: none (0) ACR TI-RADS total points: 4. ACR TI-RADS risk category: TR4 (4-6 points). ACR TI-RADS recommendations: **Given size (>/= 1.5 cm) and appearance, fine needle aspiration of this moderately suspicious nodule should be considered based on TI-RADS criteria. _________________________________________________________ IMPRESSION: Solitary 1.5 cm TI-RADS category 4 nodule in the right lower gland meets criteria to consider fine-needle aspiration biopsy. Biopsy is recommended. The above is in keeping with the ACR TI-RADS recommendations - J Am Coll Radiol 2017;14:587-595. Electronically Signed    By: Jacqulynn Cadet M.D.   On: 10/24/2022 13:02   CT CHEST WO CONTRAST  Result Date: 10/01/2022 CLINICAL DATA:  Lung nodule. EXAM: CT CHEST WITHOUT CONTRAST TECHNIQUE: Multidetector CT imaging of the chest was performed following the standard protocol without IV contrast. RADIATION DOSE REDUCTION: This exam was performed according to the departmental dose-optimization program which includes automated exposure control, adjustment of the mA and/or kV according to patient size and/or use of iterative reconstruction technique. COMPARISON:  08/05/2021 and 11/09/2018. FINDINGS: Cardiovascular: Heart is enlarged.  No pericardial effusion. Mediastinum/Nodes: 1.5 cm low-attenuation right thyroid nodule. No pathologically enlarged mediastinal or axillary lymph nodes. Hilar regions are difficult to definitively evaluate without IV contrast. Esophagus is grossly unremarkable. Lungs/Pleura: Mild bibasilar scarring. Vague 4 mm subpleural nodular density in the right lower lobe (2/65), unchanged. Per Fleischner Society guidelines, no follow-up is necessary. No pleural fluid. Airway is unremarkable. Upper Abdomen: Liver is heterogeneous in attenuation. Visualized portions of the liver, gallbladder and right adrenal gland are otherwise unremarkable. Low-attenuation left adrenal thickening. No specific follow-up necessary. Low-attenuation lesions in the right kidney measure up to 1.7 cm. No specific follow-up necessary. Visualized portions of the kidneys, spleen, pancreas and stomach are otherwise grossly unremarkable. No upper abdominal adenopathy. Musculoskeletal: No worrisome lytic or sclerotic lesions. IMPRESSION: 1. No pulmonary nodules requiring follow-up. 2. 1.5 cm low-attenuation right thyroid nodule. Recommend thyroid ultrasound. (Ref: J Am Coll Radiol. 2015 Feb;12(2): 143-50). 3. Hepatic steatosis. Electronically Signed   By: Lorin Picket M.D.   On: 10/01/2022 14:22    Assessment   GERD: doing  well.  Gastroparesis: doing well.  Thrombocytopenia: followed by hematology. CT chest without contrast recently suggests hepatic steatosis. CT A/P with contrast 2022, liver unremarkable. LFTs normal. No evidence of portal hypertension on EGD. She will continue to follow with hematology.   H/O adenomatous colon polyps: Due for surveillance exam January 2025.  Rectal bleeding: Intermittent, infrequent.  Likely due to hemorrhoidal disease.   PLAN   Monitor for change in bowels, increased rectal bleeding.  If develops any of these she is to let us know and we will pursue colonoscopy sooner than January 2025. Continue Dexilant 60 mg daily. Return office visit in 6 months.   Laureen Ochs. Bobby Rumpf, [place], [facility]

## 2022-10-27 NOTE — Patient Instructions (Signed)
Continue Dexilant '60mg'$  daily for reflux. Continue to monitor for change in stool or increased rectal bleeding. Please call for these issues. Return office visit in six months.

## 2022-11-07 DIAGNOSIS — E1149 Type 2 diabetes mellitus with other diabetic neurological complication: Secondary | ICD-10-CM | POA: Diagnosis not present

## 2022-11-07 DIAGNOSIS — L608 Other nail disorders: Secondary | ICD-10-CM | POA: Diagnosis not present

## 2022-11-07 DIAGNOSIS — L603 Nail dystrophy: Secondary | ICD-10-CM | POA: Diagnosis not present

## 2022-11-07 DIAGNOSIS — L600 Ingrowing nail: Secondary | ICD-10-CM | POA: Diagnosis not present

## 2022-11-07 DIAGNOSIS — L602 Onychogryphosis: Secondary | ICD-10-CM | POA: Diagnosis not present

## 2022-11-07 DIAGNOSIS — B351 Tinea unguium: Secondary | ICD-10-CM | POA: Diagnosis not present

## 2022-11-07 DIAGNOSIS — L03031 Cellulitis of right toe: Secondary | ICD-10-CM | POA: Diagnosis not present

## 2022-11-23 ENCOUNTER — Ambulatory Visit
Admission: RE | Admit: 2022-11-23 | Discharge: 2022-11-23 | Disposition: A | Discharge status: Home / Self Care | Payer: Medicare Other | Source: Ambulatory Visit | Attending: Family Medicine | Admitting: Family Medicine

## 2022-11-23 ENCOUNTER — Other Ambulatory Visit (HOSPITAL_COMMUNITY)
Admission: RE | Admit: 2022-11-23 | Discharge: 2022-11-23 | Disposition: A | Discharge status: Home / Self Care | Payer: Medicare Other | Source: Ambulatory Visit | Attending: Family Medicine | Admitting: Family Medicine

## 2022-11-23 DIAGNOSIS — E041 Nontoxic single thyroid nodule: Secondary | ICD-10-CM | POA: Diagnosis not present

## 2022-11-24 ENCOUNTER — Encounter (HOSPITAL_COMMUNITY): Payer: Self-pay | Admitting: Hematology

## 2022-11-28 DIAGNOSIS — N393 Stress incontinence (female) (male): Secondary | ICD-10-CM | POA: Diagnosis not present

## 2022-11-28 DIAGNOSIS — N200 Calculus of kidney: Secondary | ICD-10-CM | POA: Diagnosis not present

## 2022-11-28 LAB — CYTOLOGY - NON PAP

## 2022-11-29 ENCOUNTER — Ambulatory Visit (INDEPENDENT_AMBULATORY_CARE_PROVIDER_SITE_OTHER): Payer: Medicare Other | Admitting: Podiatry

## 2022-11-29 ENCOUNTER — Encounter: Payer: Self-pay | Admitting: Podiatry

## 2022-11-29 VITALS — BP 109/66 | HR 64

## 2022-11-29 DIAGNOSIS — E119 Type 2 diabetes mellitus without complications: Secondary | ICD-10-CM

## 2022-11-29 DIAGNOSIS — L603 Nail dystrophy: Secondary | ICD-10-CM

## 2022-11-29 DIAGNOSIS — M2010 Hallux valgus (acquired), unspecified foot: Secondary | ICD-10-CM

## 2022-11-29 NOTE — Progress Notes (Signed)
This patient returns to my office for at risk foot care.  This patient requires this care by a professional since this patient will be at risk due to having diabetes   She says she had nail surgery performed on her right big toenail and now has a nail growing out of the inside border right big toenail.  She is also interested in diabetic shoes.   This patient presents for at risk foot care today.  General Appearance  Alert, conversant and in no acute stress.  Vascular  Dorsalis pedis and posterior tibial pulses are weakly palpable  bilaterally.  Capillary return is within normal limits  bilaterally. Temperature is within normal limits  bilaterally.  Neurologic  Senn-Weinstein monofilament wire test diminished  bilaterally. Muscle power within normal limits bilaterally.  Nails Thick disfigured discolored nails with subungual debris  from hallux to fifth toes bilaterally. No evidence of bacterial infection or drainage bilaterally.  Orthopedic  No limitations of motion  feet .  No crepitus or effusions noted.  HAV  B/L.  Hammer toes  second  B/L.  Skin  normotropic skin with no porokeratosis noted bilaterally.  No signs of infections or ulcers noted.     Diabetic neuropathy.  HAV  B/L.  Consent was obtained for treatment procedures.   Removal of nail spicule right hallux.  Patient qualifies for diabetic shoes due to DPN and HAV  B/L.  To make an appointment with pedorthist.   Return office visit    prn                 Told patient to return for periodic foot care and evaluation due to potential at risk complications.   Gardiner Barefoot DPM

## 2022-12-07 DIAGNOSIS — N200 Calculus of kidney: Secondary | ICD-10-CM | POA: Diagnosis not present

## 2022-12-11 ENCOUNTER — Ambulatory Visit (INDEPENDENT_AMBULATORY_CARE_PROVIDER_SITE_OTHER): Payer: Medicare Other

## 2022-12-11 DIAGNOSIS — E119 Type 2 diabetes mellitus without complications: Secondary | ICD-10-CM

## 2022-12-11 DIAGNOSIS — M2042 Other hammer toe(s) (acquired), left foot: Secondary | ICD-10-CM

## 2022-12-11 DIAGNOSIS — M2041 Other hammer toe(s) (acquired), right foot: Secondary | ICD-10-CM

## 2022-12-11 NOTE — Addendum Note (Signed)
Addended by: Gardiner Barefoot on: 12/11/2022 01:02 PM   Modules accepted: Orders

## 2022-12-11 NOTE — Progress Notes (Signed)
Patient presents to the office today for diabetic shoe and insole measuring.  Patient was measured with brannock device to determine size and width for 1 pair of extra depth shoes and foam casted for 3 pair of insoles.   ABN signed.   Documentation of medical necessity will be sent to patient's treating diabetic doctor to verify and sign.   Patient's diabetic provider:Hagler, Apolonio Schneiders, MD  Shoes and insoles will be ordered at that time and patient will be notified for an appointment for fitting when they arrive.   Brannock measurement: 8.5 M  Patient shoe selection-   1st   Shoe choice:   [id number]  Shoe size ordered: 8.62M

## 2022-12-18 ENCOUNTER — Other Ambulatory Visit (HOSPITAL_COMMUNITY): Payer: Self-pay | Admitting: Family Medicine

## 2022-12-18 DIAGNOSIS — Z1231 Encounter for screening mammogram for malignant neoplasm of breast: Secondary | ICD-10-CM

## 2022-12-28 DIAGNOSIS — I10 Essential (primary) hypertension: Secondary | ICD-10-CM | POA: Diagnosis not present

## 2022-12-28 DIAGNOSIS — R600 Localized edema: Secondary | ICD-10-CM | POA: Diagnosis not present

## 2022-12-28 DIAGNOSIS — E538 Deficiency of other specified B group vitamins: Secondary | ICD-10-CM | POA: Diagnosis not present

## 2022-12-28 DIAGNOSIS — E118 Type 2 diabetes mellitus with unspecified complications: Secondary | ICD-10-CM | POA: Diagnosis not present

## 2022-12-28 DIAGNOSIS — F419 Anxiety disorder, unspecified: Secondary | ICD-10-CM | POA: Diagnosis not present

## 2022-12-28 DIAGNOSIS — E782 Mixed hyperlipidemia: Secondary | ICD-10-CM | POA: Diagnosis not present

## 2023-01-31 DIAGNOSIS — J301 Allergic rhinitis due to pollen: Secondary | ICD-10-CM | POA: Diagnosis not present

## 2023-02-02 DIAGNOSIS — X501XXA Overexertion from prolonged static or awkward postures, initial encounter: Secondary | ICD-10-CM | POA: Diagnosis not present

## 2023-02-02 DIAGNOSIS — E119 Type 2 diabetes mellitus without complications: Secondary | ICD-10-CM | POA: Diagnosis not present

## 2023-02-02 DIAGNOSIS — M79673 Pain in unspecified foot: Secondary | ICD-10-CM | POA: Diagnosis not present

## 2023-02-02 DIAGNOSIS — S93401A Sprain of unspecified ligament of right ankle, initial encounter: Secondary | ICD-10-CM | POA: Diagnosis not present

## 2023-02-02 DIAGNOSIS — F32A Depression, unspecified: Secondary | ICD-10-CM | POA: Diagnosis not present

## 2023-02-02 DIAGNOSIS — Z90710 Acquired absence of both cervix and uterus: Secondary | ICD-10-CM | POA: Diagnosis not present

## 2023-02-02 DIAGNOSIS — S99911A Unspecified injury of right ankle, initial encounter: Secondary | ICD-10-CM | POA: Diagnosis not present

## 2023-02-02 DIAGNOSIS — M25571 Pain in right ankle and joints of right foot: Secondary | ICD-10-CM | POA: Diagnosis not present

## 2023-02-03 DIAGNOSIS — M25571 Pain in right ankle and joints of right foot: Secondary | ICD-10-CM | POA: Diagnosis not present

## 2023-02-03 DIAGNOSIS — S99911A Unspecified injury of right ankle, initial encounter: Secondary | ICD-10-CM | POA: Diagnosis not present

## 2023-02-04 NOTE — Progress Notes (Unsigned)
GI Office Note    Referring Provider: Aliene Beams, MD Primary Care Physician:  Aliene Beams, MD  Primary Gastroenterologist: Hennie Duos. Marletta Lor, DO   Chief Complaint   No chief complaint on file.   History of Present Illness   Barbara House is a 67 y.o. female presenting today for further evaluation of abdominal pain  This SmartLink has not been configured with any valid records.   Seen 10/2022. She has a history of chronic GERD, dysphagia with prior esophageal dilation, gastroparesis, adenomatous colon polyps due for colonoscopy 2025-2030, chronic left-sided abdominal pain/left flank pain with CTs unrevealing (suspected musculoskeletal in nature), thrombocytopenia followed by hematology.     CT chest without contrast 09/2022: IMPRESSION: 1. No pulmonary nodules requiring follow-up. 2. 1.5 cm low-attenuation right thyroid nodule. Recommend thyroid ultrasound. (Ref: J Am Coll Radiol. 2015 Feb;12(2): 143-50). 3. Hepatic steatosis.   CT A/P with contrast 08/2021: Unremarkable liver. Spleen normal.  Stable right renal cyst.   EGD April 2022 with normal-appearing esophagus but empiric dilation for history of dysphagia cannot be completed due to large amount of residual food in the stomach.  She did not follow with recommendations for clear liquid diet after 6 PM the day prior.   EGD August 2022: Normal esophagus status post empiric dilation with mild resistance at 20 mm due to possible esophageal web, normal examined stomach and duodenum, small hiatal hernia.   Colonoscopy January 2020 with internal/external hemorrhoids, 3 mm cecal tubular adenoma removed. Next colonoscopy in 5 to 10 years.      Medications   Current Outpatient Medications  Medication Sig Dispense Refill   atorvastatin (LIPITOR) 20 MG tablet Take 20 mg by mouth in the morning.     chlorthalidone (HYGROTON) 25 MG tablet Take 25 mg by mouth every morning.     Cholecalciferol (VITAMIN D3) 125 MCG  (5000 UT) CAPS Take 5,000 Units by mouth in the morning.     cycloSPORINE (RESTASIS) 0.05 % ophthalmic emulsion Place 1 drop into both eyes 2 (two) times daily as needed (dry eyes).     dexlansoprazole (DEXILANT) 60 MG capsule Take one capsule daily before breakfast. 90 capsule 3   Diclofenac Sodium 1.6 % GEL Apply 2 g topically 4 (four) times daily as needed. Apply to left elbow and lower bicep region 4 times daily 50 g 0   FARXIGA 10 MG TABS tablet Take 10 mg by mouth in the morning.     Fluocinolone Acetonide 0.01 % OIL Place 1 application in ear(s) 2 (two) times daily as needed (ear itching).     furosemide (LASIX) 40 MG tablet Take 40 mg by mouth daily as needed.     GE100 BLOOD GLUCOSE TEST test strip CHECK BLOOD SUGAR ONCE DAILY OR AS DIRECTED BY PHYSICIAN     glipiZIDE (GLUCOTROL XL) 10 MG 24 hr tablet Take 10 mg by mouth every morning.     ipratropium (ATROVENT) 0.03 % nasal spray Place 2 sprays into both nostrils 3 (three) times daily as needed for rhinitis.     losartan (COZAAR) 100 MG tablet Take 100 mg by mouth in the morning.     metFORMIN (GLUCOPHAGE-XR) 500 MG 24 hr tablet 1,000 mg 2 (two) times daily.     montelukast (SINGULAIR) 10 MG tablet TAKE 1 TABLET (10 MG TOTAL) BY MOUTH AT BEDTIME AS NEEDED (FOR ALLERGIES.). 90 tablet 1   nystatin cream (MYCOSTATIN) Apply 1 application topically 2 (two) times daily as needed (vaginal itching).  PARoxetine (PAXIL) 40 MG tablet TAKE ONE TABLET BY MOUTH EVERY MORNING. (Patient taking differently: Take 40 mg by mouth in the morning.) 90 tablet 0   Semaglutide 14 MG TABS Take by mouth.     verapamil (CALAN-SR) 240 MG CR tablet TAKE ONE TABLET BY MOUTH EVERY EVENING. (Patient taking differently: Take 240 mg by mouth in the morning.) 90 tablet 0   vitamin B-12 (CYANOCOBALAMIN) 500 MCG tablet Take 500 mcg by mouth in the morning.     No current facility-administered medications for this visit.    Allergies   Allergies as of 02/05/2023 -  Review Complete 11/29/2022  Allergen Reaction Noted   Prednisone Other (See Comments) 09/15/2016     Past Medical History   Past Medical History:  Diagnosis Date   Allergic rhinitis    Anxiety disorder    Arthritis    Gastroparesis    GERD (gastroesophageal reflux disease)    Headache    History of migraine    Hx of adenomatous colonic polyps    Hypertension    Iron deficiency anemia 09/22/2021   Thrombocytopenia (HCC)    Type 2 diabetes mellitus (HCC)    Not sure when she was diagnosed, greater than five years.     Past Surgical History   Past Surgical History:  Procedure Laterality Date   ABDOMINAL HYSTERECTOMY     has ovaries.    BALLOON DILATION N/A 05/17/2021   Procedure: BALLOON DILATION;  Surgeon: Lanelle Bal, DO;  Location: [facility];  Service: Endoscopy;  Laterality: N/A;   BIOPSY  01/31/2021   Procedure: BIOPSY;  Surgeon: Lanelle Bal, DO;  Location: [facility];  Service: Endoscopy;;   CARDIAC CATHETERIZATION N/A 08/03/2016   Procedure: Left Heart Cath and Coronary Angiography;  Surgeon: Peter M Swaziland, MD;  Location[facility] INVAS[facility] CV LAB;  Service: Cardiovascular;  Laterality: N/A;   COLONOSCOPY WITH PROPOFOL N/A 11/05/2018   Dr. Darrick Penna: int/ext hemorrhoids, 3mm cecal tubular adenoma removed, next TCS in 5-10 years   ESOPHAGOGASTRODUODENOSCOPY (EGD) WITH PROPOFOL N/A 11/05/2018   Dr. Darrick Penna: esophagus was dilated, mild gastritis with benign bx   ESOPHAGOGASTRODUODENOSCOPY (EGD) WITH PROPOFOL N/A 01/31/2021   Surgeon: Lanelle Bal, DO;  Normal esophagus, large amount of food residue in the stomach, gastritis biopsied (mild chronic gastritis, negative for H. pylori), normal examined duodenum s/p biopsy (benign).   ESOPHAGOGASTRODUODENOSCOPY (EGD) WITH PROPOFOL N/A 05/17/2021   Surgeon: Lanelle Bal, DO; normal esophagus s/p empiric dilation with mild resistance at 20 mm due to possible esophageal web, normal examined stomach and duodenum,  small hiatal hernia.   Miinor skin surgery     POLYPECTOMY  11/05/2018   Procedure: POLYPECTOMY;  Surgeon: West Bali, MD;  Location: [facility];  Service: Endoscopy;;  cecal polyp   SAVORY DILATION N/A 11/05/2018   Procedure: SAVORY DILATION;  Surgeon: West Bali, MD;  Location: [facility];  Service: Endoscopy;  Laterality: N/A;    Past Family History   Family History  Problem Relation Age of Onset   Hypertension Mother    Diabetes Mother    Hypertension Son    Diabetes Son    Cancer Father    Cirrhosis Brother    Alcohol abuse Brother    Colon cancer Neg Hx     Past Social History   Social History   Socioeconomic History   Marital status: Single    Spouse name: Not on file   Number of children: 1  Years of education: Not on file   Highest education level: Not on file  Occupational History   Not on file  Tobacco Use   Smoking status: Never   Smokeless tobacco: Never  Vaping Use   Vaping Use: Never used  Substance and Sexual Activity   Alcohol use: No    Alcohol/week: 0.0 standard drinks of alcohol   Drug use: No   Sexual activity: Not Currently    Birth control/protection: Surgical  Other Topics Concern   Not on file  Social History Narrative   Reports is on disability. Has one son. Lives in [place]. Enjoys time with family. Enjoys Archivist. Attends church.    Social Determinants of Health   Financial Resource Strain: Low Risk  (01/17/2021)   Overall Financial Resource Strain (CARDIA)    Difficulty of Paying Living Expenses: Not very hard  Food Insecurity: No Food Insecurity (01/17/2021)   Hunger Vital Sign    Worried About Running Out of Food in the Last Year: Never true    Ran Out of Food in the Last Year: Never true  Transportation Needs: No Transportation Needs (01/17/2021)   PRAPARE - Administrator, Civil Service (Medical): No    Lack of Transportation (Non-Medical): No  Physical Activity: Insufficiently Active (01/17/2021)    Exercise Vital Sign    Days of Exercise per Week: 3 days    Minutes of Exercise per Session: 20 min  Stress: Stress Concern Present (01/17/2021)   [employer] of Occupational Health - Occupational Stress Questionnaire    Feeling of Stress : To some extent  Social Connections: Moderately Isolated (01/17/2021)   Social Connection and Isolation Panel [NHANES]    Frequency of Communication with Friends and Family: More than three times a week    Frequency of Social Gatherings with Friends and Family: More than three times a week    Attends Religious Services: More than 4 times per year    Active Member of [employer] or Organizations: No    Attends Engineer, structural: Not on file    Marital Status: Never married  Intimate Partner Violence: Not At Risk (01/17/2021)   Humiliation, Afraid, Rape, and Kick questionnaire    Fear of Current or Ex-Partner: No    Emotionally Abused: No    Physically Abused: No    Sexually Abused: No    Review of Systems   General: Negative for anorexia, weight loss, fever, chills, fatigue, weakness. ENT: Negative for hoarseness, difficulty swallowing , nasal congestion. CV: Negative for chest pain, angina, palpitations, dyspnea on exertion, peripheral edema.  Respiratory: Negative for dyspnea at rest, dyspnea on exertion, cough, sputum, wheezing.  GI: See history of present illness. GU:  Negative for dysuria, hematuria, urinary incontinence, urinary frequency, nocturnal urination.  Endo: Negative for unusual weight change.     Physical Exam   There were no vitals taken for this visit.   General: Well-nourished, well-developed in no acute distress.  Eyes: No icterus. Mouth: Oropharyngeal mucosa moist and pink , no lesions erythema or exudate. Lungs: Clear to auscultation bilaterally.  Heart: Regular rate and rhythm, no murmurs rubs or gallops.  Abdomen: Bowel sounds are normal, nontender, nondistended, no hepatosplenomegaly or masses,  no abdominal  bruits or hernia , no rebound or guarding.  Rectal: ***  Extremities: No lower extremity edema. No clubbing or deformities. Neuro: Alert and oriented x 4   Skin: Warm and dry, no jaundice.   Psych: Alert and cooperative, normal mood and affect.  Labs   *** Imaging Studies   No results found.  Assessment       PLAN   ***   Leanna Battles. Melvyn Neth, MHS, PA-C [facility]

## 2023-02-05 ENCOUNTER — Encounter: Payer: Self-pay | Admitting: Gastroenterology

## 2023-02-05 ENCOUNTER — Ambulatory Visit (INDEPENDENT_AMBULATORY_CARE_PROVIDER_SITE_OTHER): Payer: Medicare Other | Admitting: Gastroenterology

## 2023-02-05 VITALS — BP 118/68 | HR 66 | Temp 97.2°F | Ht 61.0 in | Wt 188.8 lb

## 2023-02-05 DIAGNOSIS — R1030 Lower abdominal pain, unspecified: Secondary | ICD-10-CM

## 2023-02-05 NOTE — Patient Instructions (Signed)
Please complete labs and urine specimen at [facility] at [address], across from [facility].

## 2023-02-12 DIAGNOSIS — R1030 Lower abdominal pain, unspecified: Secondary | ICD-10-CM | POA: Diagnosis not present

## 2023-02-14 LAB — CBC WITH DIFFERENTIAL/PLATELET
Basophils Absolute: 0.1 10*3/uL (ref 0.0–0.2)
Basos: 1 %
EOS (ABSOLUTE): 0.4 10*3/uL (ref 0.0–0.4)
Eos: 7 %
Hematocrit: 41.3 % (ref 34.0–46.6)
Hemoglobin: 13.6 g/dL (ref 11.1–15.9)
Immature Grans (Abs): 0 10*3/uL (ref 0.0–0.1)
Immature Granulocytes: 0 %
Lymphocytes Absolute: 1.3 10*3/uL (ref 0.7–3.1)
Lymphs: 24 %
MCH: 29 pg (ref 26.6–33.0)
MCHC: 32.9 g/dL (ref 31.5–35.7)
MCV: 88 fL (ref 79–97)
Monocytes Absolute: 0.5 10*3/uL (ref 0.1–0.9)
Monocytes: 9 %
Neutrophils Absolute: 3.1 10*3/uL (ref 1.4–7.0)
Neutrophils: 59 %
Platelets: 79 10*3/uL — CL (ref 150–450)
RBC: 4.69 x10E6/uL (ref 3.77–5.28)
RDW: 12.2 % (ref 11.7–15.4)
WBC: 5.3 10*3/uL (ref 3.4–10.8)

## 2023-02-14 LAB — UA/M W/RFLX CULTURE, ROUTINE
Bilirubin, UA: NEGATIVE
Ketones, UA: NEGATIVE
Leukocytes,UA: NEGATIVE
Nitrite, UA: NEGATIVE
RBC, UA: NEGATIVE
Specific Gravity, UA: >=1.03 — AB (ref 1.005–1.030)
Urobilinogen, Ur: 0.2 mg/dL (ref 0.2–1.0)
pH, UA: 5.5 (ref 5.0–7.5)

## 2023-02-14 LAB — COMPREHENSIVE METABOLIC PANEL
ALT: 26 IU/L (ref 0–32)
AST: 41 IU/L — ABNORMAL HIGH (ref 0–40)
Albumin/Globulin Ratio: 1.5 (ref 1.2–2.2)
Albumin: 4.4 g/dL (ref 3.9–4.9)
Alkaline Phosphatase: 104 IU/L (ref 44–121)
BUN/Creatinine Ratio: 17 (ref 12–28)
BUN: 21 mg/dL (ref 8–27)
Bilirubin Total: <0.2 mg/dL (ref 0.0–1.2)
CO2: 24 mmol/L (ref 20–29)
Calcium: 10.5 mg/dL — ABNORMAL HIGH (ref 8.7–10.3)
Chloride: 104 mmol/L (ref 96–106)
Creatinine, Ser: 1.23 mg/dL — ABNORMAL HIGH (ref 0.57–1.00)
Globulin, Total: 2.9 g/dL (ref 1.5–4.5)
Glucose: 92 mg/dL (ref 70–99)
Potassium: 4.2 mmol/L (ref 3.5–5.2)
Sodium: 143 mmol/L (ref 134–144)
Total Protein: 7.3 g/dL (ref 6.0–8.5)
eGFR: 49 mL/min/{1.73_m2} — ABNORMAL LOW (ref >59)

## 2023-02-14 LAB — MICROSCOPIC EXAMINATION
Bacteria, UA: NONE SEEN
Casts: NONE SEEN /lpf
Epithelial Cells (non renal): NONE SEEN /hpf (ref 0–10)
RBC, Urine: NONE SEEN /hpf (ref 0–2)
WBC, UA: NONE SEEN /hpf (ref 0–5)

## 2023-02-15 DIAGNOSIS — L603 Nail dystrophy: Secondary | ICD-10-CM | POA: Diagnosis not present

## 2023-02-15 DIAGNOSIS — L600 Ingrowing nail: Secondary | ICD-10-CM | POA: Diagnosis not present

## 2023-02-15 DIAGNOSIS — E1149 Type 2 diabetes mellitus with other diabetic neurological complication: Secondary | ICD-10-CM | POA: Diagnosis not present

## 2023-02-15 DIAGNOSIS — L608 Other nail disorders: Secondary | ICD-10-CM | POA: Diagnosis not present

## 2023-02-15 DIAGNOSIS — L602 Onychogryphosis: Secondary | ICD-10-CM | POA: Diagnosis not present

## 2023-02-15 DIAGNOSIS — B351 Tinea unguium: Secondary | ICD-10-CM | POA: Diagnosis not present

## 2023-02-15 DIAGNOSIS — L84 Corns and callosities: Secondary | ICD-10-CM | POA: Diagnosis not present

## 2023-02-15 DIAGNOSIS — M79671 Pain in right foot: Secondary | ICD-10-CM | POA: Diagnosis not present

## 2023-02-16 DIAGNOSIS — S93401A Sprain of unspecified ligament of right ankle, initial encounter: Secondary | ICD-10-CM | POA: Diagnosis not present

## 2023-02-22 ENCOUNTER — Inpatient Hospital Stay: Payer: Medicare Other | Attending: Physician Assistant

## 2023-02-22 DIAGNOSIS — E611 Iron deficiency: Secondary | ICD-10-CM | POA: Insufficient documentation

## 2023-02-22 DIAGNOSIS — D696 Thrombocytopenia, unspecified: Secondary | ICD-10-CM | POA: Diagnosis not present

## 2023-02-22 DIAGNOSIS — Z79899 Other long term (current) drug therapy: Secondary | ICD-10-CM | POA: Diagnosis not present

## 2023-02-22 DIAGNOSIS — E559 Vitamin D deficiency, unspecified: Secondary | ICD-10-CM | POA: Diagnosis not present

## 2023-02-22 DIAGNOSIS — D509 Iron deficiency anemia, unspecified: Secondary | ICD-10-CM

## 2023-02-22 DIAGNOSIS — E538 Deficiency of other specified B group vitamins: Secondary | ICD-10-CM

## 2023-02-22 DIAGNOSIS — D508 Other iron deficiency anemias: Secondary | ICD-10-CM

## 2023-02-22 LAB — CBC WITH DIFFERENTIAL/PLATELET
Abs Immature Granulocytes: 0.02 10*3/uL (ref 0.00–0.07)
Basophils Absolute: 0.1 10*3/uL (ref 0.0–0.1)
Basophils Relative: 1 %
Eosinophils Absolute: 0.3 10*3/uL (ref 0.0–0.5)
Eosinophils Relative: 7 %
HCT: 40.5 % (ref 36.0–46.0)
Hemoglobin: 13 g/dL (ref 12.0–15.0)
Immature Granulocytes: 0 %
Lymphocytes Relative: 25 %
Lymphs Abs: 1.1 10*3/uL (ref 0.7–4.0)
MCH: 29.4 pg (ref 26.0–34.0)
MCHC: 32.1 g/dL (ref 30.0–36.0)
MCV: 91.6 fL (ref 80.0–100.0)
Monocytes Absolute: 0.4 10*3/uL (ref 0.1–1.0)
Monocytes Relative: 8 %
Neutro Abs: 2.7 10*3/uL (ref 1.7–7.7)
Neutrophils Relative %: 59 %
Platelets: 83 10*3/uL — ABNORMAL LOW (ref 150–400)
RBC: 4.42 MIL/uL (ref 3.87–5.11)
RDW: 13.2 % (ref 11.5–15.5)
WBC: 4.6 10*3/uL (ref 4.0–10.5)
nRBC: 0 % (ref 0.0–0.2)

## 2023-02-22 LAB — IRON AND TIBC
Iron: 60 ug/dL (ref 28–170)
Saturation Ratios: 17 % (ref 10.4–31.8)
TIBC: 356 ug/dL (ref 250–450)
UIBC: 296 ug/dL

## 2023-02-22 LAB — VITAMIN D 25 HYDROXY (VIT D DEFICIENCY, FRACTURES): Vit D, 25-Hydroxy: 66.53 ng/mL (ref 30–100)

## 2023-02-22 LAB — VITAMIN B12: Vitamin B-12: 708 pg/mL (ref 180–914)

## 2023-02-22 LAB — FERRITIN: Ferritin: 110 ng/mL (ref 11–307)

## 2023-02-27 LAB — METHYLMALONIC ACID, SERUM: Methylmalonic Acid, Quantitative: 114 nmol/L (ref 0–378)

## 2023-02-28 NOTE — Progress Notes (Unsigned)
[facility] [address], [place] [address]   CLINIC:  Medical Oncology/Hematology  PCP:  Aliene Beams, MD [address] [place] [address] [phone]   REASON FOR VISIT:  Follow-up for thrombocytopenia and iron deficiency   PRIOR THERAPY: Oral iron   CURRENT THERAPY: Intermittent IV iron  INTERVAL HISTORY:   Ms. Wellons 67 y.o. female returns for routine follow-up of thrombocytopenia and iron deficiency.  She was last seen by Rojelio Brenner PA-C on 08/24/2022.  At today's visit, she reports feeling ***.  No recent hospitalizations, surgeries, or changes in baseline health status. ***  She has not noticed any bright red blood per rectum or melena. *** She does note that she has had some scant epistaxis when she blows her nose. *** She denies any fatigue, pica, restless legs, headaches, chest pain, dyspnea on exertion, lightheadedness, or syncope. *** She reports easy bruising, but denies any petechial rash. *** No B symptoms such as fever, chills, night sweats.   She has ***% energy and ***% appetite. She endorses that she is maintaining a stable weight.  ASSESSMENT & PLAN:  1.  Thrombocytopenia, mild to moderate - Initial work-up unable to determine cause of thrombocytopenia - SPEP was normal, hepatitis panel was normal, RF and ANA were negative, no overt nutritional deficiencies (normal B12, folate, methylmalonic acid, copper).  Previous testing of hepatitis C, HIV, and H. pylori were negative. - Pathologist smear review on 08/29/2019 showed mild thrombocytopenia, borderline anemia - Normal abdominal ultrasound from 08/15/2017 negative for liver abnormality, spleen was normal in size and appearance - CT abdomen/pelvis (08/17/2021): Normal liver and spleen - Reported having one blood transfusion many years ago, but unsure of reason - Patient is taking vitamin B12 pills*** - Platelets have ranged from 80-109 since January 2020 - No major  bleeding events, no B symptoms  *** - No abnormal bruising or petechial rash  *** - Most recent labs (02/22/2023): Platelets 83, at baseline.  Otherwise normal CBC. - Differential diagnosis includes immune thrombocytopenia versus early MDS  - PLAN:  No intervention needed at this time, we will continue to monitor. - RTC in 6 months for repeat labs and follow-up.  We will also check immature platelet fraction. - Will consider trial of steroids and/or bone marrow biopsy if any significant changes in CBC.   2.  Normocytic anemia (resolved) with iron deficiency state - Patient has had prior history of mild normocytic anemia with hemoglobin ranging from 11-12, possibly secondary to CKD and functional iron deficiency - EGD (05/17/2021): No evidence of bleeding, erosions, ulcerations, or varices - Colonoscopy (11/05/2018): Polyp x1, external and internal hemorrhoids - Currently taking daily B12 tablet daily.*** - Failed to improve on oral iron supplementation - Received IV Feraheme x2 in December 2022 - Energy improved after IV iron - No bright red blood per rectum or melena*** - Most recent labs (02/22/2023): Normal Hgb 13.0/MCV 91.6, ferritin 110, iron saturation 17% with normal TIBC.  Normal B12 (708) and MMA. - PLAN: No indication for IV iron at this time. - RTC in 6 months for follow-up visit and repeat CBC and iron panel.  (Check B12/MMA annually, next due May 2025)   3.  Vitamin D deficiency - Hypovitaminosis D evidenced in August 2021 with vitamin D level 23.76  - Currently taking Vitamin D3 cholecalciferol 5,000 units daily*** - Most recent vitamin D (02/22/2023) is normal at 66.53 - PLAN: Continue vitamin D supplementation.  Check vitamin D in 1  year (~May 2025)   PLAN SUMMARY: >> Labs in 6 months = CBC/D, ferritin, iron/TIBC, immature platelet fraction >> OFFICE visit in 6 months (1 week after labs) ***    REVIEW OF SYSTEMS: ***  Review of Systems - Oncology   PHYSICAL EXAM:  ECOG  PERFORMANCE STATUS: {CHL ONC ECOG UJ:[id number]} *** There were no vitals filed for this visit. There were no vitals filed for this visit. Physical Exam  PAST MEDICAL/SURGICAL HISTORY:  Past Medical History:  Diagnosis Date   Allergic rhinitis    Anxiety disorder    Arthritis    Gastroparesis    GERD (gastroesophageal reflux disease)    Headache    History of migraine    Hx of adenomatous colonic polyps    Hypertension    Iron deficiency anemia 09/22/2021   Thrombocytopenia (HCC)    Type 2 diabetes mellitus (HCC)    Not sure when she was diagnosed, greater than five years.    Past Surgical History:  Procedure Laterality Date   ABDOMINAL HYSTERECTOMY     has ovaries.    BALLOON DILATION N/A 05/17/2021   Procedure: BALLOON DILATION;  Surgeon: Lanelle Bal, DO;  [facility];  Service: Endoscopy;  Laterality: N/A;   BIOPSY  01/31/2021   Procedure: BIOPSY;  Surgeon: Lanelle Bal, DO;  [facility];  Service: Endoscopy;;   CARDIAC CATHETERIZATION N/A 08/03/2016   Procedure: Left Heart Cath and Coronary Angiography;  Surgeon: Peter M Swaziland, MD;  Location: [facility] CV [facility];  Service: Cardiovascular;  Laterality: N/A;   COLONOSCOPY WITH PROPOFOL N/A 11/05/2018   Dr. Darrick Penna: int/ext hemorrhoids, 3mm cecal tubular adenoma removed, next TCS in 5-10 years   ESOPHAGOGASTRODUODENOSCOPY (EGD) WITH PROPOFOL N/A 11/05/2018   Dr. Darrick Penna: esophagus was dilated, mild gastritis with benign bx   ESOPHAGOGASTRODUODENOSCOPY (EGD) WITH PROPOFOL N/A 01/31/2021   Surgeon: Lanelle Bal, DO;  Normal esophagus, large amount of food residue in the stomach, gastritis biopsied (mild chronic gastritis, negative for H. pylori), normal examined duodenum s/p biopsy (benign).   ESOPHAGOGASTRODUODENOSCOPY (EGD) WITH PROPOFOL N/A 05/17/2021   Surgeon: Lanelle Bal, DO; normal esophagus s/p empiric dilation with mild resistance at 20 mm due to possible esophageal web,  normal examined stomach and duodenum, small hiatal hernia.   Miinor skin surgery     POLYPECTOMY  11/05/2018   Procedure: POLYPECTOMY;  Surgeon: West Bali, MD;  [facility];  Service: Endoscopy;;  cecal polyp   SAVORY DILATION N/A 11/05/2018   Procedure: SAVORY DILATION;  Surgeon: West Bali, MD;  [facility]: [facility];  Service: Endoscopy;  Laterality: N/A;    SOCIAL HISTORY:  Social History   Socioeconomic History   Marital status: Single    Spouse name: Not on file   Number of children: 1   Years of education: Not on file   Highest education level: Not on file  Occupational History   Not on file  Tobacco Use   Smoking status: Never   Smokeless tobacco: Never  Vaping Use   Vaping Use: Never used  Substance and Sexual Activity   Alcohol use: No    Alcohol/week: 0.0 standard drinks of alcohol   Drug use: No   Sexual activity: Not Currently    Birth control/protection: Surgical  Other Topics Concern   Not on file  Social History Narrative   Reports is on disability. Has one son. [place] in [place]. Enjoys time with family. Enjoys Archivist. Attends church.  Social Determinants of Health   Financial Resource Strain: Low Risk  (01/17/2021)   Overall Financial Resource Strain (CARDIA)    Difficulty of Paying Living Expenses: Not very hard  Food Insecurity: No Food Insecurity (01/17/2021)   Hunger Vital Sign    Worried About Running Out of Food in the Last Year: Never true    Ran Out of Food in the Last Year: Never true  Transportation Needs: No Transportation Needs (01/17/2021)   PRAPARE - Administrator, Civil Service (Medical): No    Lack of Transportation (Non-Medical): No  Physical Activity: Insufficiently Active (01/17/2021)   Exercise Vital Sign    Days of Exercise per Week: 3 days    Minutes of Exercise per Session: 20 min  Stress: Stress Concern Present (01/17/2021)   [employer] of Occupational Health - Occupational Stress  Questionnaire    Feeling of Stress : To some extent  Social Connections: Moderately Isolated (01/17/2021)   Social Connection and Isolation Panel [NHANES]    Frequency of Communication with Friends and Family: More than three times a week    Frequency of Social Gatherings with Friends and Family: More than three times a week    Attends Religious Services: More than 4 times per year    Active Member of [employer] or Organizations: No    Attends Engineer, structural: Not on file    Marital Status: Never married  Intimate Partner Violence: Not At Risk (01/17/2021)   Humiliation, Afraid, Rape, and Kick questionnaire    Fear of Current or Ex-Partner: No    Emotionally Abused: No    Physically Abused: No    Sexually Abused: No    FAMILY HISTORY:  Family History  Problem Relation Age of Onset   Hypertension Mother    Diabetes Mother    Hypertension Son    Diabetes Son    Cancer Father    Cirrhosis Brother    Alcohol abuse Brother    Colon cancer Neg Hx     CURRENT MEDICATIONS:  Outpatient Encounter Medications as of 03/01/2023  Medication Sig   atorvastatin (LIPITOR) 20 MG tablet Take 20 mg by mouth in the morning.   chlorthalidone (HYGROTON) 25 MG tablet Take 25 mg by mouth every morning.   Cholecalciferol (VITAMIN D3) 125 MCG (5000 UT) CAPS Take 5,000 Units by mouth in the morning.   cycloSPORINE (RESTASIS) 0.05 % ophthalmic emulsion Place 1 drop into both eyes 2 (two) times daily as needed (dry eyes).   dexlansoprazole (DEXILANT) 60 MG capsule Take one capsule daily before breakfast.   Diclofenac Sodium 1.6 % GEL Apply 2 g topically 4 (four) times daily as needed. Apply to left elbow and lower bicep region 4 times daily   FARXIGA 10 MG TABS tablet Take 10 mg by mouth in the morning.   Fluocinolone Acetonide 0.01 % OIL Place 1 application in ear(s) 2 (two) times daily as needed (ear itching).   furosemide (LASIX) 40 MG tablet Take 40 mg by mouth daily as needed.   GE100 BLOOD  GLUCOSE TEST test strip CHECK BLOOD SUGAR ONCE DAILY OR AS DIRECTED BY PHYSICIAN   glipiZIDE (GLUCOTROL XL) 10 MG 24 hr tablet Take 10 mg by mouth every morning.   ipratropium (ATROVENT) 0.03 % nasal spray Place 2 sprays into both nostrils 3 (three) times daily as needed for rhinitis.   losartan (COZAAR) 100 MG tablet Take 100 mg by mouth in the morning.   metFORMIN (GLUCOPHAGE-XR) 500 MG 24  hr tablet 1,000 mg 2 (two) times daily.   montelukast (SINGULAIR) 10 MG tablet TAKE 1 TABLET (10 MG TOTAL) BY MOUTH AT BEDTIME AS NEEDED (FOR ALLERGIES.).   nystatin cream (MYCOSTATIN) Apply 1 application topically 2 (two) times daily as needed (vaginal itching).   PARoxetine (PAXIL) 40 MG tablet TAKE ONE TABLET BY MOUTH EVERY MORNING. (Patient taking differently: Take 40 mg by mouth in the morning.)   Semaglutide 14 MG TABS Take by mouth.   verapamil (CALAN-SR) 240 MG CR tablet TAKE ONE TABLET BY MOUTH EVERY EVENING. (Patient taking differently: Take 240 mg by mouth in the morning.)   vitamin B-12 (CYANOCOBALAMIN) 500 MCG tablet Take 500 mcg by mouth in the morning.   No facility-administered encounter medications on file as of 03/01/2023.    ALLERGIES:  Allergies  Allergen Reactions   Prednisone Other (See Comments)    Hair loss    LABORATORY DATA:  I have reviewed the labs as listed.  CBC    Component Value Date/Time   WBC 4.6 02/22/2023 0940   RBC 4.42 02/22/2023 0940   HGB 13.0 02/22/2023 0940   HGB 13.6 02/12/2023 0843   HCT 40.5 02/22/2023 0940   HCT 41.3 02/12/2023 0843   PLT 83 (L) 02/22/2023 0940   PLT 79 (LL) 02/12/2023 0843   MCV 91.6 02/22/2023 0940   MCV 88 02/12/2023 0843   MCH 29.4 02/22/2023 0940   MCHC 32.1 02/22/2023 0940   RDW 13.2 02/22/2023 0940   RDW 12.2 02/12/2023 0843   LYMPHSABS 1.1 02/22/2023 0940   LYMPHSABS 1.3 02/12/2023 0843   MONOABS 0.4 02/22/2023 0940   EOSABS 0.3 02/22/2023 0940   EOSABS 0.4 02/12/2023 0843   BASOSABS 0.1 02/22/2023 0940   BASOSABS  0.1 02/12/2023 0843      Latest Ref Rng & Units 02/12/2023    8:43 AM 08/17/2021   10:19 AM 08/05/2021    9:35 AM  CMP  Glucose 70 - 99 mg/dL 92  69    BUN 8 - 27 mg/dL 21  41    Creatinine 0.98 - 1.00 mg/dL 1.19  1.47  8.29   Sodium 134 - 144 mmol/L 143  139    Potassium 3.5 - 5.2 mmol/L 4.2  4.4    Chloride 96 - 106 mmol/L 104  106    CO2 20 - 29 mmol/L 24  24    Calcium 8.7 - 10.3 mg/dL 56.2  13.0    Total Protein 6.0 - 8.5 g/dL 7.3  7.6    Total Bilirubin 0.0 - 1.2 mg/dL <8.6  0.3    Alkaline Phos 44 - 121 IU/L 104  66    AST 0 - 40 IU/L 41  23    ALT 0 - 32 IU/L 26  16      DIAGNOSTIC IMAGING:  I have independently reviewed the relevant imaging and discussed with the patient.   WRAP UP:  All questions were answered. The patient knows to call the clinic with any problems, questions or concerns.  Medical decision making: ***  Time spent on visit: I spent *** minutes counseling the patient face to face. The total time spent in the appointment was *** minutes and more than 50% was on counseling.  Carnella Guadalajara, PA-C  ***

## 2023-03-01 ENCOUNTER — Inpatient Hospital Stay (HOSPITAL_BASED_OUTPATIENT_CLINIC_OR_DEPARTMENT_OTHER): Payer: Medicare Other | Admitting: Physician Assistant

## 2023-03-01 ENCOUNTER — Other Ambulatory Visit: Payer: Self-pay

## 2023-03-01 VITALS — BP 169/85 | HR 63 | Temp 97.7°F | Resp 18 | Wt 194.0 lb

## 2023-03-01 DIAGNOSIS — E559 Vitamin D deficiency, unspecified: Secondary | ICD-10-CM | POA: Diagnosis not present

## 2023-03-01 DIAGNOSIS — Z79899 Other long term (current) drug therapy: Secondary | ICD-10-CM | POA: Diagnosis not present

## 2023-03-01 DIAGNOSIS — E538 Deficiency of other specified B group vitamins: Secondary | ICD-10-CM | POA: Diagnosis not present

## 2023-03-01 DIAGNOSIS — D696 Thrombocytopenia, unspecified: Secondary | ICD-10-CM

## 2023-03-01 DIAGNOSIS — E611 Iron deficiency: Secondary | ICD-10-CM | POA: Diagnosis not present

## 2023-03-01 DIAGNOSIS — D508 Other iron deficiency anemias: Secondary | ICD-10-CM

## 2023-03-01 DIAGNOSIS — D509 Iron deficiency anemia, unspecified: Secondary | ICD-10-CM

## 2023-03-01 NOTE — Patient Instructions (Signed)
[facility] at [facility] Discharge Instructions  You were seen today by Rojelio Brenner PA-C for your low platelets and anemia (low red blood cells).  LOW PLATELETS: Your low platelets are mildly low, but stable at their baseline.  We will continue to watch these, but there is no treatment we need to give you for them right now.  IRON DEFICIENCY ANEMIA: Your blood and iron levels looked great!  Keep taking your B12 and iron and vitamin D pills.  LABS: Return in 6 months for repeat labs  FOLLOW-UP APPOINTMENT: Office visit in 6 months.   Thank you for choosing [facility] at [facility] to provide your oncology and hematology care.  To afford each patient quality time with our provider, please arrive at least 15 minutes before your scheduled appointment time.   If you have a lab appointment with the Cancer Center please come in thru the Main Entrance and check in at the main information desk.  You need to re-schedule your appointment should you arrive 10 or more minutes late.  We strive to give you quality time with our providers, and arriving late affects you and other patients whose appointments are after yours.  Also, if you no show three or more times for appointments you may be dismissed from the clinic at the providers discretion.     Again, thank you for choosing [facility].  Our hope is that these requests will decrease the amount of time that you wait before being seen by our physicians.       _____________________________________________________________  Should you have questions after your visit to [facility], please contact our office at [phone] and follow the prompts.  Our office hours are 8:00 a.m. and 4:30 p.m. Monday - Friday.  Please note that voicemails left after 4:00 p.m. may not be returned until the following business day.  We are closed weekends and major holidays.  You do have access to a  nurse 24-7, just call the main number to the clinic [phone] and do not press any options, hold on the line and a nurse will answer the phone.    For prescription refill requests, have your pharmacy contact our office and allow 72 hours.    Due to Covid, you will need to wear a mask upon entering the hospital. If you do not have a mask, a mask will be given to you at the Main Entrance upon arrival. For doctor visits, patients may have 1 support person age 19 or older with them. For treatment visits, patients can not have anyone with them due to social distancing guidelines and our immunocompromised population.

## 2023-03-02 DIAGNOSIS — S93401A Sprain of unspecified ligament of right ankle, initial encounter: Secondary | ICD-10-CM | POA: Diagnosis not present

## 2023-03-14 DIAGNOSIS — S93401A Sprain of unspecified ligament of right ankle, initial encounter: Secondary | ICD-10-CM | POA: Diagnosis not present

## 2023-03-19 DIAGNOSIS — S93401A Sprain of unspecified ligament of right ankle, initial encounter: Secondary | ICD-10-CM | POA: Diagnosis not present

## 2023-03-27 DIAGNOSIS — S93401A Sprain of unspecified ligament of right ankle, initial encounter: Secondary | ICD-10-CM | POA: Diagnosis not present

## 2023-03-29 ENCOUNTER — Encounter: Payer: Self-pay | Admitting: Gastroenterology

## 2023-03-30 DIAGNOSIS — S93401A Sprain of unspecified ligament of right ankle, initial encounter: Secondary | ICD-10-CM | POA: Diagnosis not present

## 2023-04-03 DIAGNOSIS — S93401A Sprain of unspecified ligament of right ankle, initial encounter: Secondary | ICD-10-CM | POA: Diagnosis not present

## 2023-04-09 ENCOUNTER — Ambulatory Visit (HOSPITAL_COMMUNITY)
Admission: RE | Admit: 2023-04-09 | Discharge: 2023-04-09 | Disposition: A | Discharge status: Home / Self Care | Payer: Medicare Other | Source: Ambulatory Visit | Attending: Family Medicine | Admitting: Family Medicine

## 2023-04-09 DIAGNOSIS — Z1231 Encounter for screening mammogram for malignant neoplasm of breast: Secondary | ICD-10-CM | POA: Diagnosis not present

## 2023-04-13 DIAGNOSIS — S93401A Sprain of unspecified ligament of right ankle, initial encounter: Secondary | ICD-10-CM | POA: Diagnosis not present

## 2023-04-23 DIAGNOSIS — S93401A Sprain of unspecified ligament of right ankle, initial encounter: Secondary | ICD-10-CM | POA: Diagnosis not present

## 2023-04-25 ENCOUNTER — Encounter: Payer: Self-pay | Admitting: Gastroenterology

## 2023-04-25 ENCOUNTER — Ambulatory Visit (INDEPENDENT_AMBULATORY_CARE_PROVIDER_SITE_OTHER): Payer: Medicare Other | Admitting: Gastroenterology

## 2023-04-25 VITALS — BP 149/86 | HR 65 | Temp 97.8°F | Ht 61.0 in | Wt 191.6 lb

## 2023-04-25 DIAGNOSIS — K219 Gastro-esophageal reflux disease without esophagitis: Secondary | ICD-10-CM

## 2023-04-25 DIAGNOSIS — R197 Diarrhea, unspecified: Secondary | ICD-10-CM | POA: Diagnosis not present

## 2023-04-25 DIAGNOSIS — K3184 Gastroparesis: Secondary | ICD-10-CM

## 2023-04-25 MED ORDER — DEXLANSOPRAZOLE 60 MG PO CPDR: DELAYED_RELEASE_CAPSULE | ORAL | 3 refills | Status: DC

## 2023-04-25 NOTE — Progress Notes (Signed)
GI Office Note    Referring Provider: Aliene Beams, MD Primary Care Physician:  Aliene Beams, MD  Primary Gastroenterologist: Hennie Duos. Marletta Lor, DO   Chief Complaint   Chief Complaint  Patient presents with   Follow-up    Doing well, no issues    History of Present Illness   Barbara House is a 67 y.o. female presenting today for follow-up. Last seen in 01/2023. She has a history of chronic GERD, dysphagia with prior esophageal dilation, gastroparesis, adenomatous colon polyps due for colonoscopy 2025-2030, chronic left-sided abdominal pain/left flank pain with CTs unrevealing (suspected musculoskeletal in nature), thrombocytopenia followed by hematology.   Overall doing well.  Bowel movements better since switching from Janumet to regular metformin in 2023.  Having 2-3 bowel movements per day.  Stools are soft to mushy.  No melena or rectal bleeding.  Rarely has any abdominal pain.  No heartburn.  No nausea or vomiting.  Has a history of thrombocytopenia followed by hematology.  Most recent dedicated imaging of the liver via CT abdomen pelvis with contrast in 2022 with unremarkable liver.  No evidence of portal hypertension on EGD in 2022.,  Cytopenia goes back to at least January 2020.  CT chest without contrast 09/2022: IMPRESSION: 1. No pulmonary nodules requiring follow-up. 2. 1.5 cm low-attenuation right thyroid nodule. Recommend thyroid ultrasound. (Ref: J Am Coll Radiol. 2015 Feb;12(2): 143-50). 3. Hepatic steatosis.   CT A/P with contrast 08/2021: Unremarkable liver. Spleen normal.  Stable right renal cyst.   EGD April 2022 with normal-appearing esophagus but empiric dilation for history of dysphagia cannot be completed due to large amount of residual food in the stomach. She did not follow with recommendations for clear liquid diet after 6 PM the day prior.   EGD August 2022: Normal esophagus status post empiric dilation with mild resistance at 20 mm due to  possible esophageal web, normal examined stomach and duodenum, small hiatal hernia.   Colonoscopy January 2020 with internal/external hemorrhoids, 3 mm cecal tubular adenoma removed. Next colonoscopy in 5 to10 years.   Medications   Current Outpatient Medications  Medication Sig Dispense Refill   atorvastatin (LIPITOR) 20 MG tablet Take 20 mg by mouth in the morning.     chlorthalidone (HYGROTON) 25 MG tablet Take 25 mg by mouth every morning.     Cholecalciferol (VITAMIN D3) 125 MCG (5000 UT) CAPS Take 5,000 Units by mouth in the morning.     cycloSPORINE (RESTASIS) 0.05 % ophthalmic emulsion Place 1 drop into both eyes 2 (two) times daily as needed (dry eyes).     Diclofenac Sodium 1.6 % GEL Apply 2 g topically 4 (four) times daily as needed. Apply to left elbow and lower bicep region 4 times daily 50 g 0   FARXIGA 10 MG TABS tablet Take 10 mg by mouth in the morning.     Fluocinolone Acetonide 0.01 % OIL Place 1 application in ear(s) 2 (two) times daily as needed (ear itching).     GE100 BLOOD GLUCOSE TEST test strip CHECK BLOOD SUGAR ONCE DAILY OR AS DIRECTED BY PHYSICIAN     glipiZIDE (GLUCOTROL XL) 10 MG 24 hr tablet Take 10 mg by mouth every morning.     ipratropium (ATROVENT) 0.03 % nasal spray Place 2 sprays into both nostrils 3 (three) times daily as needed for rhinitis.     losartan (COZAAR) 100 MG tablet Take 100 mg by mouth in the morning.     metFORMIN (GLUCOPHAGE-XR) 500  MG 24 hr tablet 1,000 mg 2 (two) times daily.     montelukast (SINGULAIR) 10 MG tablet TAKE 1 TABLET (10 MG TOTAL) BY MOUTH AT BEDTIME AS NEEDED (FOR ALLERGIES.). 90 tablet 1   nystatin cream (MYCOSTATIN) Apply 1 application topically 2 (two) times daily as needed (vaginal itching).     PARoxetine (PAXIL) 40 MG tablet TAKE ONE TABLET BY MOUTH EVERY MORNING. (Patient taking differently: Take 40 mg by mouth in the morning.) 90 tablet 0   Semaglutide 14 MG TABS Take by mouth.     verapamil (CALAN-SR) 240 MG CR  tablet TAKE ONE TABLET BY MOUTH EVERY EVENING. (Patient taking differently: Take 240 mg by mouth in the morning.) 90 tablet 0   vitamin B-12 (CYANOCOBALAMIN) 500 MCG tablet Take 500 mcg by mouth in the morning.     dexlansoprazole (DEXILANT) 60 MG capsule Take one capsule daily before breakfast. 90 capsule 3   No current facility-administered medications for this visit.    Allergies   Allergies as of 04/25/2023 - Review Complete 04/25/2023  Allergen Reaction Noted   Prednisone Other (See Comments) 09/15/2016      Review of Systems   General: Negative for anorexia, weight loss, fever, chills, fatigue, weakness. ENT: Negative for hoarseness, difficulty swallowing , nasal congestion. CV: Negative for chest pain, angina, palpitations, dyspnea on exertion, peripheral edema.  Respiratory: Negative for dyspnea at rest, dyspnea on exertion, cough, sputum, wheezing.  GI: See history of present illness. GU:  Negative for dysuria, hematuria, urinary incontinence, urinary frequency, nocturnal urination.  Endo: Negative for unusual weight change.     Physical Exam   BP (!) 149/86 (BP Location: Right Arm, Patient Position: Sitting, Cuff Size: Large)   Pulse 65   Temp 97.8 F (36.6 C) (Oral)   Ht 5\' 1"  (1.549 m)   Wt 191 lb 9.6 oz (86.9 kg)   SpO2 97%   BMI 36.20 kg/m    General: Well-nourished, well-developed in no acute distress.  Eyes: No icterus. Mouth: Oropharyngeal mucosa moist and pink   Abdomen: Bowel sounds are normal, nontender, nondistended, no hepatosplenomegaly or masses,  no abdominal bruits or hernia , no rebound or guarding.  Rectal: not performed Extremities: No lower extremity edema. No clubbing or deformities. Neuro: Alert and oriented x 4   Skin: Warm and dry, no jaundice.   Psych: Alert and cooperative, normal mood and affect.  Labs   Lab Results  Component Value Date   IRON 60 02/22/2023   TIBC 356 02/22/2023   FERRITIN 110 02/22/2023   Lab Results   Component Value Date   VITAMINB12 708 02/22/2023   Lab Results  Component Value Date   FOLATE 13.0 05/12/2021   Lab Results  Component Value Date   WBC 4.6 02/22/2023   HGB 13.0 02/22/2023   HCT 40.5 02/22/2023   MCV 91.6 02/22/2023   PLT 83 (L) 02/22/2023   Lab Results  Component Value Date   CREATININE 1.23 (H) 02/12/2023   BUN 21 02/12/2023   NA 143 02/12/2023   K 4.2 02/12/2023   CL 104 02/12/2023   CO2 24 02/12/2023   Lab Results  Component Value Date   ALT 26 02/12/2023   AST 41 (H) 02/12/2023   ALKPHOS 104 02/12/2023   BILITOT <0.2 02/12/2023    Imaging Studies   MM 3D SCREEN BREAST BILATERAL  Result Date: 04/11/2023 CLINICAL DATA:  Screening. EXAM: DIGITAL SCREENING BILATERAL MAMMOGRAM WITH TOMOSYNTHESIS AND CAD TECHNIQUE: Bilateral screening digital craniocaudal and  mediolateral oblique mammograms were obtained. Bilateral screening digital breast tomosynthesis was performed. The images were evaluated with computer-aided detection. COMPARISON:  Previous exam(s). ACR Breast Density Category a: The breasts are almost entirely fatty. FINDINGS: There are no findings suspicious for malignancy. IMPRESSION: No mammographic evidence of malignancy. A result letter of this screening mammogram will be mailed directly to the patient. RECOMMENDATION: Screening mammogram in one year. (Code:SM-B-01Y) BI-RADS CATEGORY  1: Negative. Electronically Signed   By: Bary Richard M.D.   On: 04/11/2023 11:44    Assessment   GERD: Doing well.  Gastroparesis: Doing well.  Intermittent diarrhea: Improved with medication changes.  Continue to follow.  History of adenomatous colon polyps: Due for surveillance in January 2025.  Thrombocytopenia: Followed by hematology.  Last dedicated liver imaging CT abdomen pelvis with contrast in 2022 with unremarkable liver.  CT chest last year with hepatic steatosis.  No evidence of portal hypertension on EGD in 2022.  No convincing evidence of  advanced liver disease.  Minimally elevated AST: Nonspecific.  Follow-up with next labs.  The patient was found to have elevated blood pressure when vital signs were checked in the office. The blood pressure was rechecked by the nursing staff and it was found be persistently elevated >140/90 mmHg. I personally advised to the patient to follow up closely with his PCP for hypertension control.  PLAN   Continue Dexilant 60 mg daily before breakfast. Continue to follow with hematology. Return office visit in 6 months.  Will reassess liver labs at that time.  Consider updated imaging if persistent elevated LFTs. Schedule colonoscopy at next office visit.  Leanna Battles. Melvyn Neth, MHS, PA-C [facility]

## 2023-04-25 NOTE — Patient Instructions (Signed)
We have refilled your Dexilant for acid reflux. We will plan to see you back in 6 months.  At that time we will schedule your next colonoscopy.

## 2023-04-27 DIAGNOSIS — S93401A Sprain of unspecified ligament of right ankle, initial encounter: Secondary | ICD-10-CM | POA: Diagnosis not present

## 2023-04-30 DIAGNOSIS — Z0000 Encounter for general adult medical examination without abnormal findings: Secondary | ICD-10-CM | POA: Diagnosis not present

## 2023-04-30 DIAGNOSIS — E1143 Type 2 diabetes mellitus with diabetic autonomic (poly)neuropathy: Secondary | ICD-10-CM | POA: Diagnosis not present

## 2023-04-30 DIAGNOSIS — I10 Essential (primary) hypertension: Secondary | ICD-10-CM | POA: Diagnosis not present

## 2023-04-30 DIAGNOSIS — K219 Gastro-esophageal reflux disease without esophagitis: Secondary | ICD-10-CM | POA: Diagnosis not present

## 2023-04-30 DIAGNOSIS — E2839 Other primary ovarian failure: Secondary | ICD-10-CM | POA: Diagnosis not present

## 2023-04-30 DIAGNOSIS — D696 Thrombocytopenia, unspecified: Secondary | ICD-10-CM | POA: Diagnosis not present

## 2023-04-30 DIAGNOSIS — F419 Anxiety disorder, unspecified: Secondary | ICD-10-CM | POA: Diagnosis not present

## 2023-04-30 DIAGNOSIS — E782 Mixed hyperlipidemia: Secondary | ICD-10-CM | POA: Diagnosis not present

## 2023-05-01 DIAGNOSIS — S93401A Sprain of unspecified ligament of right ankle, initial encounter: Secondary | ICD-10-CM | POA: Diagnosis not present

## 2023-05-31 DIAGNOSIS — M7741 Metatarsalgia, right foot: Secondary | ICD-10-CM | POA: Diagnosis not present

## 2023-05-31 DIAGNOSIS — L84 Corns and callosities: Secondary | ICD-10-CM | POA: Diagnosis not present

## 2023-05-31 DIAGNOSIS — L600 Ingrowing nail: Secondary | ICD-10-CM | POA: Diagnosis not present

## 2023-05-31 DIAGNOSIS — E1149 Type 2 diabetes mellitus with other diabetic neurological complication: Secondary | ICD-10-CM | POA: Diagnosis not present

## 2023-05-31 DIAGNOSIS — M2041 Other hammer toe(s) (acquired), right foot: Secondary | ICD-10-CM | POA: Diagnosis not present

## 2023-05-31 DIAGNOSIS — M2012 Hallux valgus (acquired), left foot: Secondary | ICD-10-CM | POA: Diagnosis not present

## 2023-05-31 DIAGNOSIS — B351 Tinea unguium: Secondary | ICD-10-CM | POA: Diagnosis not present

## 2023-05-31 DIAGNOSIS — L608 Other nail disorders: Secondary | ICD-10-CM | POA: Diagnosis not present

## 2023-05-31 DIAGNOSIS — L603 Nail dystrophy: Secondary | ICD-10-CM | POA: Diagnosis not present

## 2023-05-31 DIAGNOSIS — M2011 Hallux valgus (acquired), right foot: Secondary | ICD-10-CM | POA: Diagnosis not present

## 2023-05-31 DIAGNOSIS — L602 Onychogryphosis: Secondary | ICD-10-CM | POA: Diagnosis not present

## 2023-05-31 DIAGNOSIS — M2042 Other hammer toe(s) (acquired), left foot: Secondary | ICD-10-CM | POA: Diagnosis not present

## 2023-06-19 DIAGNOSIS — L0390 Cellulitis, unspecified: Secondary | ICD-10-CM | POA: Diagnosis not present

## 2023-06-19 DIAGNOSIS — K649 Unspecified hemorrhoids: Secondary | ICD-10-CM | POA: Diagnosis not present

## 2023-06-19 DIAGNOSIS — R197 Diarrhea, unspecified: Secondary | ICD-10-CM | POA: Diagnosis not present

## 2023-07-30 DIAGNOSIS — N1832 Chronic kidney disease, stage 3b: Secondary | ICD-10-CM | POA: Diagnosis not present

## 2023-08-06 DIAGNOSIS — N1832 Chronic kidney disease, stage 3b: Secondary | ICD-10-CM | POA: Diagnosis not present

## 2023-08-06 DIAGNOSIS — D631 Anemia in chronic kidney disease: Secondary | ICD-10-CM | POA: Diagnosis not present

## 2023-08-06 DIAGNOSIS — D696 Thrombocytopenia, unspecified: Secondary | ICD-10-CM | POA: Diagnosis not present

## 2023-08-06 DIAGNOSIS — I129 Hypertensive chronic kidney disease with stage 1 through stage 4 chronic kidney disease, or unspecified chronic kidney disease: Secondary | ICD-10-CM | POA: Diagnosis not present

## 2023-08-06 DIAGNOSIS — E559 Vitamin D deficiency, unspecified: Secondary | ICD-10-CM | POA: Diagnosis not present

## 2023-08-06 DIAGNOSIS — E1122 Type 2 diabetes mellitus with diabetic chronic kidney disease: Secondary | ICD-10-CM | POA: Diagnosis not present

## 2023-08-07 DIAGNOSIS — Z23 Encounter for immunization: Secondary | ICD-10-CM | POA: Diagnosis not present

## 2023-08-14 DIAGNOSIS — Z01419 Encounter for gynecological examination (general) (routine) without abnormal findings: Secondary | ICD-10-CM | POA: Diagnosis not present

## 2023-08-22 ENCOUNTER — Inpatient Hospital Stay: Payer: Medicare Other | Attending: Hematology

## 2023-08-22 DIAGNOSIS — D696 Thrombocytopenia, unspecified: Secondary | ICD-10-CM | POA: Diagnosis not present

## 2023-08-22 DIAGNOSIS — E559 Vitamin D deficiency, unspecified: Secondary | ICD-10-CM | POA: Insufficient documentation

## 2023-08-22 DIAGNOSIS — E611 Iron deficiency: Secondary | ICD-10-CM | POA: Insufficient documentation

## 2023-08-22 DIAGNOSIS — Z79899 Other long term (current) drug therapy: Secondary | ICD-10-CM | POA: Insufficient documentation

## 2023-08-22 DIAGNOSIS — D508 Other iron deficiency anemias: Secondary | ICD-10-CM

## 2023-08-22 DIAGNOSIS — D509 Iron deficiency anemia, unspecified: Secondary | ICD-10-CM

## 2023-08-22 LAB — CBC WITH DIFFERENTIAL/PLATELET
Abs Immature Granulocytes: 0.02 10*3/uL (ref 0.00–0.07)
Basophils Absolute: 0.1 10*3/uL (ref 0.0–0.1)
Basophils Relative: 1 %
Eosinophils Absolute: 0.4 10*3/uL (ref 0.0–0.5)
Eosinophils Relative: 7 %
HCT: 44.1 % (ref 36.0–46.0)
Hemoglobin: 14 g/dL (ref 12.0–15.0)
Immature Granulocytes: 0 %
Lymphocytes Relative: 28 %
Lymphs Abs: 1.6 10*3/uL (ref 0.7–4.0)
MCH: 28.6 pg (ref 26.0–34.0)
MCHC: 31.7 g/dL (ref 30.0–36.0)
MCV: 90 fL (ref 80.0–100.0)
Monocytes Absolute: 0.5 10*3/uL (ref 0.1–1.0)
Monocytes Relative: 8 %
Neutro Abs: 3.1 10*3/uL (ref 1.7–7.7)
Neutrophils Relative %: 56 %
Platelets: 91 10*3/uL — ABNORMAL LOW (ref 150–400)
RBC: 4.9 MIL/uL (ref 3.87–5.11)
RDW: 13.1 % (ref 11.5–15.5)
WBC: 5.6 10*3/uL (ref 4.0–10.5)
nRBC: 0 % (ref 0.0–0.2)

## 2023-08-22 LAB — IRON AND TIBC
Iron: 85 ug/dL (ref 28–170)
Saturation Ratios: 21 % (ref 10.4–31.8)
TIBC: 405 ug/dL (ref 250–450)
UIBC: 320 ug/dL

## 2023-08-22 LAB — IMMATURE PLATELET FRACTION: Immature Platelet Fraction: 6.7 % (ref 1.2–8.6)

## 2023-08-22 LAB — FERRITIN: Ferritin: 113 ng/mL (ref 11–307)

## 2023-08-28 DIAGNOSIS — L600 Ingrowing nail: Secondary | ICD-10-CM | POA: Diagnosis not present

## 2023-08-28 DIAGNOSIS — L602 Onychogryphosis: Secondary | ICD-10-CM | POA: Diagnosis not present

## 2023-08-28 DIAGNOSIS — M7741 Metatarsalgia, right foot: Secondary | ICD-10-CM | POA: Diagnosis not present

## 2023-08-28 DIAGNOSIS — M2042 Other hammer toe(s) (acquired), left foot: Secondary | ICD-10-CM | POA: Diagnosis not present

## 2023-08-28 DIAGNOSIS — M2011 Hallux valgus (acquired), right foot: Secondary | ICD-10-CM | POA: Diagnosis not present

## 2023-08-28 DIAGNOSIS — M2041 Other hammer toe(s) (acquired), right foot: Secondary | ICD-10-CM | POA: Diagnosis not present

## 2023-08-28 DIAGNOSIS — L84 Corns and callosities: Secondary | ICD-10-CM | POA: Diagnosis not present

## 2023-08-28 DIAGNOSIS — M2012 Hallux valgus (acquired), left foot: Secondary | ICD-10-CM | POA: Diagnosis not present

## 2023-08-28 DIAGNOSIS — E1149 Type 2 diabetes mellitus with other diabetic neurological complication: Secondary | ICD-10-CM | POA: Diagnosis not present

## 2023-08-28 NOTE — Progress Notes (Unsigned)
[facility] [address], [place] [address]   CLINIC:  Medical Oncology/Hematology  PCP:  Aliene Beams, MD [phone] [address] [place] [address] [phone]    REASON FOR VISIT:  Follow-up for thrombocytopenia and iron deficiency   PRIOR THERAPY: Oral iron   CURRENT THERAPY: Intermittent IV iron  INTERVAL HISTORY:   Barbara House 67 y.o. female returns for routine follow-up of thrombocytopenia and iron deficiency.  She was last seen by Rojelio Brenner PA-C on 03/01/2023.  At today's visit, she reports feeling fairly well.  No recent hospitalizations, surgeries, or changes in baseline health status.   She has not noticed any bright red blood per rectum or melena.  She does note occasional scant epistaxis when she blows her nose.  She denies any fatigue, pica, restless legs, headaches, chest pain, dyspnea on exertion, lightheadedness, or syncope.   She reports easy bruising, but denies any petechial rash.   No B symptoms such as fever, chills, night sweats.  She has 100% energy and 100% appetite. She endorses that she is maintaining a stable weight.  ASSESSMENT & PLAN:  1.  Thrombocytopenia, mild to moderate - Initial work-up unable to determine cause of thrombocytopenia - SPEP was normal, hepatitis panel was normal, RF and ANA were negative, no overt nutritional deficiencies (normal B12, folate, methylmalonic acid, copper).  Previous testing of hepatitis C, HIV, and H. pylori were negative. - Pathologist smear review on 08/29/2019 showed mild thrombocytopenia, borderline anemia - Normal abdominal ultrasound from 08/15/2017 negative for liver abnormality, spleen was normal in size and appearance - CT abdomen/pelvis (08/17/2021): Normal liver and spleen - Reported having one blood transfusion many years ago, but unsure of reason - Patient is taking vitamin B12 pills - Platelets have ranged from 80-109 since January 2020 - No major bleeding  events, no B symptoms   - No abnormal bruising or petechial rash   - Most recent labs (08/22/2023): Platelets 91, at baseline.  Otherwise normal CBC.  Immature platelet fraction normal at 6.7%. 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 - RTC in 6 months for repeat labs and follow-up.   - Will consider trial of steroids and/or bone marrow biopsy if any significant changes in CBC.   2.  Normocytic anemia (resolved) with iron deficiency state - Patient has had prior history of mild normocytic anemia with hemoglobin ranging from 11-12, possibly secondary to CKD and functional iron deficiency - EGD (05/17/2021): No evidence of bleeding, erosions, ulcerations, or varices - Colonoscopy (11/05/2018): Polyp x1, external and internal hemorrhoids - Currently taking daily B12 tablet daily and iron tablet daily. - Received IV Feraheme x2 in December 2022 - Energy improved after IV iron - No bright red blood per rectum or melena - Most recent labs (08/22/2023): Normal Hgb 14.0/MCV 90.0, ferritin 113, iron saturation 21% with normal TIBC.  (Normal B12 (708) and MMA when checked in May 2024) - PLAN: No indication for IV iron at this time. - RTC in 6 months for follow-up visit and repeat CBC and iron panel.  (Check B12/MMA annually, next due May 2025)   3.  Vitamin D deficiency - Hypovitaminosis D evidenced in August 2021 with vitamin D level 23.76  - Currently taking Vitamin D3 cholecalciferol 5,000 units daily - Most recent vitamin D (02/22/2023) is normal at 66.53 - PLAN: Continue vitamin D supplementation.  Check  vitamin D in 1 year (~May 2025)   PLAN SUMMARY: >> Labs in 6 months = CBC/D, ferritin, iron/TIBC, B12, MMA, vitamin D >> OFFICE visit in 6 months (1 week after labs)     REVIEW OF SYSTEMS:   Review of Systems  Constitutional:  Negative for appetite change, chills, diaphoresis,  fatigue, fever and unexpected weight change.  HENT:   Negative for lump/mass and nosebleeds.   Eyes:  Negative for eye problems.  Respiratory:  Positive for cough (sinus drainage). Negative for hemoptysis and shortness of breath.   Cardiovascular:  Positive for chest pain (intermittently, none today). Negative for leg swelling and palpitations.  Gastrointestinal:  Positive for diarrhea. Negative for abdominal pain, blood in stool, constipation, nausea and vomiting.  Genitourinary:  Negative for hematuria.   Skin:  Positive for itching and rash.  Neurological:  Positive for headaches and light-headedness. Negative for dizziness and numbness.  Hematological:  Does not bruise/bleed easily.     PHYSICAL EXAM:  ECOG PERFORMANCE STATUS: 1 - Symptomatic but completely ambulatory  Vitals:   08/29/23 1010  BP: 120/78  Pulse: 69  Resp: 16  Temp: (!) 96.8 F (36 C)  SpO2: 98%   Filed Weights   08/29/23 1010  Weight: 184 lb 1.6 oz (83.5 kg)   Physical Exam Constitutional:      Appearance: Normal appearance. She is obese.  Cardiovascular:     Heart sounds: Normal heart sounds.  Pulmonary:     Breath sounds: Normal breath sounds.  Musculoskeletal:     Right lower leg: Edema present.     Left lower leg: Edema present.  Neurological:     General: No focal deficit present.     Mental Status: Mental status is at baseline.  Psychiatric:        Behavior: Behavior normal. Behavior is cooperative.     PAST MEDICAL/SURGICAL HISTORY:  Past Medical History:  Diagnosis Date   Allergic rhinitis    Anxiety disorder    Arthritis    Gastroparesis    GERD (gastroesophageal reflux disease)    Headache    History of migraine    Hx of adenomatous colonic polyps    Hypertension    Iron deficiency anemia 09/22/2021   Thrombocytopenia (HCC)    Type 2 diabetes mellitus (HCC)    Not sure when she was diagnosed, greater than five years.    Past Surgical History:  Procedure Laterality Date    ABDOMINAL HYSTERECTOMY     has ovaries.    BALLOON DILATION N/A 05/17/2021   Procedure: BALLOON DILATION;  Surgeon: Lanelle Bal, DO;  [facility]: [facility];  Service: Endoscopy;  Laterality: N/A;   BIOPSY  01/31/2021   Procedure: BIOPSY;  Surgeon: Lanelle Bal, DO;  [facility]: [facility];  Service: Endoscopy;;   CARDIAC CATHETERIZATION N/A 08/03/2016   Procedure: Left Heart Cath and Coronary Angiography;  Surgeon: Peter M Swaziland, MD;  [facility]: [facility];  Service: Cardiovascular;  Laterality: N/A;   COLONOSCOPY WITH PROPOFOL N/A 11/05/2018   Dr. Darrick Penna: int/ext hemorrhoids, 3mm cecal tubular adenoma removed, next TCS in 5-10 years   ESOPHAGOGASTRODUODENOSCOPY (EGD) WITH PROPOFOL N/A 11/05/2018   Dr. Darrick Penna: esophagus was dilated, mild gastritis with benign bx   ESOPHAGOGASTRODUODENOSCOPY (EGD) WITH PROPOFOL N/A 01/31/2021   Surgeon: Lanelle Bal, DO;  Normal esophagus, large amount of food residue in the stomach, gastritis biopsied (mild chronic gastritis, negative for H. pylori), normal examined duodenum s/p biopsy (benign).   ESOPHAGOGASTRODUODENOSCOPY (  EGD) WITH PROPOFOL N/A 05/17/2021   Surgeon: Lanelle Bal, DO; normal esophagus s/p empiric dilation with mild resistance at 20 mm due to possible esophageal web, normal examined stomach and duodenum, small hiatal hernia.   Miinor skin surgery     POLYPECTOMY  11/05/2018   Procedure: POLYPECTOMY;  Surgeon: West Bali, MD;  Location: [facility];  Service: Endoscopy;;  cecal polyp   SAVORY DILATION N/A 11/05/2018   Procedure: SAVORY DILATION;  Surgeon: West Bali, MD;  Location: [facility];  Service: Endoscopy;  Laterality: N/A;    SOCIAL HISTORY:  Social History   Socioeconomic History   Marital status: Single    Spouse name: Not on file   Number of children: 1   Years of education: Not on file   Highest education level: Not on file  Occupational History   Not on file  Tobacco Use    Smoking status: Never   Smokeless tobacco: Never  Vaping Use   Vaping status: Never Used  Substance and Sexual Activity   Alcohol use: No    Alcohol/week: 0.0 standard drinks of alcohol   Drug use: No   Sexual activity: Not Currently    Birth control/protection: Surgical  Other Topics Concern   Not on file  Social History Narrative   Reports is on disability. Has one son. Lives in [place]. Enjoys time with family. Enjoys Archivist. Attends church.    Social Determinants of Health   Financial Resource Strain: Low Risk  (01/17/2021)   Overall Financial Resource Strain (CARDIA)    Difficulty of Paying Living Expenses: Not very hard  Food Insecurity: No Food Insecurity (01/17/2021)   Hunger Vital Sign    Worried About Running Out of Food in the Last Year: Never true    Ran Out of Food in the Last Year: Never true  Transportation Needs: No Transportation Needs (01/17/2021)   PRAPARE - Administrator, Civil Service (Medical): No    Lack of Transportation (Non-Medical): No  Physical Activity: Insufficiently Active (08/14/2023)   Received from [facility]   Exercise Vital Sign    Days of Exercise per Week: 4 days    Minutes of Exercise per Session: 20 min  Stress: No Stress Concern Present (08/11/2021)   Received from [facility] of Occupational Health - Occupational Stress Questionnaire    Feeling of Stress : Only a little  Social Connections: Moderately Isolated (01/17/2021)   Social Connection and Isolation Panel [NHANES]    Frequency of Communication with Friends and Family: More than three times a week    Frequency of Social Gatherings with Friends and Family: More than three times a week    Attends Religious Services: More than 4 times per year    Active Member of [employer] or Organizations: No    Attends Engineer, structural: Not on file    Marital Status: Never married  Intimate Partner Violence: Not At Risk  (08/14/2023)   Received from [facility]   Humiliation, Afraid, Rape, and Kick questionnaire    Fear of Current or Ex-Partner: No    Emotionally Abused: No    Physically Abused: No    Sexually Abused: No    FAMILY HISTORY:  Family History  Problem Relation Age of Onset   Hypertension Mother    Diabetes Mother    Hypertension Son    Diabetes Son    Cancer Father  Cirrhosis Brother    Alcohol abuse Brother    Colon cancer Neg Hx     CURRENT MEDICATIONS:  Outpatient Encounter Medications as of 08/29/2023  Medication Sig   atorvastatin (LIPITOR) 20 MG tablet Take 20 mg by mouth in the morning.   chlorthalidone (HYGROTON) 25 MG tablet Take 25 mg by mouth every morning.   Cholecalciferol (VITAMIN D3) 125 MCG (5000 UT) CAPS Take 5,000 Units by mouth in the morning.   cycloSPORINE (RESTASIS) 0.05 % ophthalmic emulsion Place 1 drop into both eyes 2 (two) times daily as needed (dry eyes).   dexlansoprazole (DEXILANT) 60 MG capsule Take one capsule daily before breakfast.   Diclofenac Sodium 1.6 % GEL Apply 2 g topically 4 (four) times daily as needed. Apply to left elbow and lower bicep region 4 times daily   FARXIGA 10 MG TABS tablet Take 10 mg by mouth in the morning.   Fluocinolone Acetonide 0.01 % OIL Place 1 application in ear(s) 2 (two) times daily as needed (ear itching).   GE100 BLOOD GLUCOSE TEST test strip CHECK BLOOD SUGAR ONCE DAILY OR AS DIRECTED BY PHYSICIAN   ipratropium (ATROVENT) 0.03 % nasal spray Place 2 sprays into both nostrils 3 (three) times daily as needed for rhinitis.   losartan (COZAAR) 100 MG tablet Take 100 mg by mouth in the morning.   metFORMIN (GLUCOPHAGE-XR) 500 MG 24 hr tablet 1,000 mg 2 (two) times daily.   montelukast (SINGULAIR) 10 MG tablet TAKE 1 TABLET (10 MG TOTAL) BY MOUTH AT BEDTIME AS NEEDED (FOR ALLERGIES.).   nystatin cream (MYCOSTATIN) Apply 1 application topically 2 (two) times daily as needed (vaginal itching).   PARoxetine (PAXIL)  40 MG tablet TAKE ONE TABLET BY MOUTH EVERY MORNING. (Patient taking differently: Take 40 mg by mouth in the morning.)   Semaglutide 14 MG TABS Take by mouth.   verapamil (CALAN-SR) 240 MG CR tablet TAKE ONE TABLET BY MOUTH EVERY EVENING. (Patient taking differently: Take 240 mg by mouth in the morning.)   vitamin B-12 (CYANOCOBALAMIN) 500 MCG tablet Take 500 mcg by mouth in the morning.   [DISCONTINUED] glipiZIDE (GLUCOTROL XL) 10 MG 24 hr tablet Take 10 mg by mouth every morning.   No facility-administered encounter medications on file as of 08/29/2023.    ALLERGIES:  Allergies  Allergen Reactions   Prednisone Other (See Comments)    Hair loss    LABORATORY DATA:  I have reviewed the labs as listed.  CBC    Component Value Date/Time   WBC 5.6 08/22/2023 1001   RBC 4.90 08/22/2023 1001   HGB 14.0 08/22/2023 1001   HGB 13.6 02/12/2023 0843   HCT 44.1 08/22/2023 1001   HCT 41.3 02/12/2023 0843   PLT 91 (L) 08/22/2023 1001   PLT 79 (LL) 02/12/2023 0843   MCV 90.0 08/22/2023 1001   MCV 88 02/12/2023 0843   MCH 28.6 08/22/2023 1001   MCHC 31.7 08/22/2023 1001   RDW 13.1 08/22/2023 1001   RDW 12.2 02/12/2023 0843   LYMPHSABS 1.6 08/22/2023 1001   LYMPHSABS 1.3 02/12/2023 0843   MONOABS 0.5 08/22/2023 1001   EOSABS 0.4 08/22/2023 1001   EOSABS 0.4 02/12/2023 0843   BASOSABS 0.1 08/22/2023 1001   BASOSABS 0.1 02/12/2023 0843      Latest Ref Rng & Units 02/12/2023    8:43 AM 08/17/2021   10:19 AM 08/05/2021    9:35 AM  CMP  Glucose 70 - 99 mg/dL 92  69  BUN 8 - 27 mg/dL 21  41    Creatinine 4.09 - 1.00 mg/dL 8.11  9.14  7.82   Sodium 134 - 144 mmol/L 143  139    Potassium 3.5 - 5.2 mmol/L 4.2  4.4    Chloride 96 - 106 mmol/L 104  106    CO2 20 - 29 mmol/L 24  24    Calcium 8.7 - 10.3 mg/dL 95.6  21.3    Total Protein 6.0 - 8.5 g/dL 7.3  7.6    Total Bilirubin 0.0 - 1.2 mg/dL <0.8  0.3    Alkaline Phos 44 - 121 IU/L 104  66    AST 0 - 40 IU/L 41  23    ALT 0 - 32  IU/L 26  16      DIAGNOSTIC IMAGING:  I have independently reviewed the relevant imaging and discussed with the patient.   WRAP UP:  All questions were answered. The patient knows to call the clinic with any problems, questions or concerns.  Medical decision making: Moderate  Time spent on visit: I spent 20 minutes counseling the patient face to face. The total time spent in the appointment was 30 minutes and more than 50% was on counseling.  Carnella Guadalajara, PA-C  08/29/23 10:59 AM

## 2023-08-29 ENCOUNTER — Inpatient Hospital Stay (HOSPITAL_BASED_OUTPATIENT_CLINIC_OR_DEPARTMENT_OTHER): Payer: Medicare Other | Admitting: Physician Assistant

## 2023-08-29 VITALS — BP 120/78 | HR 69 | Temp 96.8°F | Resp 16 | Wt 184.1 lb

## 2023-08-29 DIAGNOSIS — D696 Thrombocytopenia, unspecified: Secondary | ICD-10-CM

## 2023-08-29 DIAGNOSIS — E538 Deficiency of other specified B group vitamins: Secondary | ICD-10-CM

## 2023-08-29 DIAGNOSIS — E559 Vitamin D deficiency, unspecified: Secondary | ICD-10-CM

## 2023-08-29 DIAGNOSIS — D509 Iron deficiency anemia, unspecified: Secondary | ICD-10-CM | POA: Diagnosis not present

## 2023-08-29 DIAGNOSIS — E611 Iron deficiency: Secondary | ICD-10-CM | POA: Diagnosis not present

## 2023-08-29 DIAGNOSIS — Z79899 Other long term (current) drug therapy: Secondary | ICD-10-CM | POA: Diagnosis not present

## 2023-08-29 NOTE — Patient Instructions (Signed)
[facility] at [facility] Discharge Instructions  You were seen today by Rojelio Brenner PA-C for your low platelets and anemia (low red blood cells).  LOW PLATELETS: Your low platelets are mildly low, but stable at their baseline.  We will continue to watch these, but there is no treatment we need to give you for them right now.  IRON DEFICIENCY ANEMIA: Your blood and iron levels looked great!  Keep taking your B12 and iron and vitamin D pills.  LABS: Return in 6 months for repeat labs  FOLLOW-UP APPOINTMENT: Office visit in 6 months.   Thank you for choosing [facility] at [facility] to provide your oncology and hematology care.  To afford each patient quality time with our provider, please arrive at least 15 minutes before your scheduled appointment time.   If you have a lab appointment with the Cancer Center please come in thru the Main Entrance and check in at the main information desk.  You need to re-schedule your appointment should you arrive 10 or more minutes late.  We strive to give you quality time with our providers, and arriving late affects you and other patients whose appointments are after yours.  Also, if you no show three or more times for appointments you may be dismissed from the clinic at the providers discretion.     Again, thank you for choosing [facility].  Our hope is that these requests will decrease the amount of time that you wait before being seen by our physicians.       _____________________________________________________________  Should you have questions after your visit to [facility], please contact our office at [phone] and follow the prompts.  Our office hours are 8:00 a.m. and 4:30 p.m. Monday - Friday.  Please note that voicemails left after 4:00 p.m. may not be returned until the following business day.  We are closed weekends and major holidays.  You do have access to a  nurse 24-7, just call the main number to the clinic [phone] and do not press any options, hold on the line and a nurse will answer the phone.    For prescription refill requests, have your pharmacy contact our office and allow 72 hours.    Due to Covid, you will need to wear a mask upon entering the hospital. If you do not have a mask, a mask will be given to you at the Main Entrance upon arrival. For doctor visits, patients may have 1 support person age 19 or older with them. For treatment visits, patients can not have anyone with them due to social distancing guidelines and our immunocompromised population.

## 2023-10-03 ENCOUNTER — Encounter (INDEPENDENT_AMBULATORY_CARE_PROVIDER_SITE_OTHER): Payer: Self-pay | Admitting: *Deleted

## 2023-10-11 ENCOUNTER — Encounter: Payer: Self-pay | Admitting: Gastroenterology

## 2023-10-23 ENCOUNTER — Telehealth: Payer: Self-pay | Admitting: *Deleted

## 2023-10-23 NOTE — Telephone Encounter (Signed)
 Procedure: Colonoscopy  Height: 5'1 Weight: 183lbs        Have you had a colonoscopy before?  2020, Dr. Cindie  Do you have family history of colon cancer?  no  Do you have a family history of polyps? no  Previous colonoscopy with polyps removed? yes  Do you have a history colorectal cancer?   no  Are you diabetic?  Yes type 2   Do you have a prosthetic or mechanical heart valve? no  Do you have a pacemaker/defibrillator?   no  Have you had endocarditis/atrial fibrillation?  no  Do you use supplemental oxygen /CPAP?  no  Have you had joint replacement within the last 12 months?  no  Do you tend to be constipated or have to use laxatives?  no   Do you have history of alcohol use? If yes, how much and how often.  no  Do you have history or are you using drugs? If yes, what do are you  using?  no  Have you ever had a stroke/heart attack?  no  Have you ever had a heart or other vascular stent placed,?no  Do you take weight loss medication? no  female patients,: have you had a hysterectomy? yes                              are you post menopausal?  no                              do you still have your menstrual cycle? no    Date of last menstrual period?   Do you take any blood-thinning medications such as: (Plavix, aspirin , Coumadin, Aggrenox, Brilinta, Xarelto, Eliquis, Pradaxa, Savaysa or Effient)? no  If yes we need the name, milligram, dosage and who is prescribing doctor:               Current Outpatient Medications  Medication Sig Dispense Refill   atorvastatin  (LIPITOR ) 20 MG tablet Take 20 mg by mouth in the morning.     chlorthalidone (HYGROTON) 25 MG tablet Take 25 mg by mouth every morning.     Cholecalciferol (VITAMIN D3) 125 MCG (5000 UT) CAPS Take 5,000 Units by mouth in the morning.     cycloSPORINE  (RESTASIS ) 0.05 % ophthalmic emulsion Place 1 drop into both eyes 2 (two) times daily as needed (dry eyes).     dexlansoprazole  (DEXILANT ) 60 MG capsule  Take one capsule daily before breakfast. 90 capsule 3   Diclofenac  Sodium 1.6 % GEL Apply 2 g topically 4 (four) times daily as needed. Apply to left elbow and lower bicep region 4 times daily 50 g 0   FARXIGA 10 MG TABS tablet Take 10 mg by mouth in the morning.     Fluocinolone Acetonide 0.01 % OIL Place 1 application in ear(s) 2 (two) times daily as needed (ear itching).     GE100 BLOOD GLUCOSE TEST test strip CHECK BLOOD SUGAR ONCE DAILY OR AS DIRECTED BY PHYSICIAN     ipratropium (ATROVENT) 0.03 % nasal spray Place 2 sprays into both nostrils 3 (three) times daily as needed for rhinitis.     losartan (COZAAR) 100 MG tablet Take 100 mg by mouth in the morning.     metFORMIN  (GLUCOPHAGE -XR) 500 MG 24 hr tablet 1,000 mg 2 (two) times daily.     montelukast  (SINGULAIR ) 10 MG tablet TAKE  1 TABLET (10 MG TOTAL) BY MOUTH AT BEDTIME AS NEEDED (FOR ALLERGIES.). 90 tablet 1   nystatin cream (MYCOSTATIN) Apply 1 application topically 2 (two) times daily as needed (vaginal itching).     PARoxetine  (PAXIL ) 40 MG tablet TAKE ONE TABLET BY MOUTH EVERY MORNING. (Patient taking differently: Take 40 mg by mouth in the morning.) 90 tablet 0   Semaglutide 14 MG TABS Take by mouth.     verapamil  (CALAN -SR) 240 MG CR tablet TAKE ONE TABLET BY MOUTH EVERY EVENING. (Patient taking differently: Take 240 mg by mouth in the morning.) 90 tablet 0   vitamin B-12 (CYANOCOBALAMIN ) 500 MCG tablet Take 500 mcg by mouth in the morning.     No current facility-administered medications for this visit.    Allergies  Allergen Reactions   Prednisone Other (See Comments)    Hair loss

## 2023-10-30 ENCOUNTER — Encounter: Payer: Self-pay | Admitting: *Deleted

## 2023-10-30 ENCOUNTER — Other Ambulatory Visit: Payer: Self-pay | Admitting: *Deleted

## 2023-10-30 ENCOUNTER — Ambulatory Visit (INDEPENDENT_AMBULATORY_CARE_PROVIDER_SITE_OTHER): Payer: Medicare Other | Admitting: Gastroenterology

## 2023-10-30 ENCOUNTER — Encounter: Payer: Self-pay | Admitting: Gastroenterology

## 2023-10-30 VITALS — BP 122/77 | HR 65 | Temp 97.9°F | Ht 61.0 in | Wt 179.4 lb

## 2023-10-30 DIAGNOSIS — K760 Fatty (change of) liver, not elsewhere classified: Secondary | ICD-10-CM | POA: Diagnosis not present

## 2023-10-30 DIAGNOSIS — K3184 Gastroparesis: Secondary | ICD-10-CM

## 2023-10-30 DIAGNOSIS — R1084 Generalized abdominal pain: Secondary | ICD-10-CM | POA: Diagnosis not present

## 2023-10-30 DIAGNOSIS — K219 Gastro-esophageal reflux disease without esophagitis: Secondary | ICD-10-CM

## 2023-10-30 DIAGNOSIS — R109 Unspecified abdominal pain: Secondary | ICD-10-CM | POA: Insufficient documentation

## 2023-10-30 DIAGNOSIS — D696 Thrombocytopenia, unspecified: Secondary | ICD-10-CM

## 2023-10-30 DIAGNOSIS — Z860101 Personal history of adenomatous and serrated colon polyps: Secondary | ICD-10-CM

## 2023-10-30 MED ORDER — FAMOTIDINE 20 MG PO TABS: 20.0000 mg | ORAL_TABLET | Freq: Every day | ORAL | 3 refills | Status: DC

## 2023-10-30 MED ORDER — PEG 3350-KCL-NA BICARB-NACL 420 G PO SOLR: 4000.0000 mL | Freq: Once | ORAL | 0 refills | Status: AC

## 2023-10-30 NOTE — H&P (View-Only) (Signed)
 GI Office Note    Referring Provider: Rolinda Millman, MD Primary Care Physician:  Rolinda Millman, MD  Primary Gastroenterologist: Carlin POUR. Cindie, DO   Chief Complaint   Chief Complaint  Patient presents with   Follow-up    Has some pain near naval at times    History of Present Illness   Barbara House is a 68 y.o. female presenting today for follow up. Last seen in 04/2023. H/o GERD, dysphagia s/p dilation, gastroparesis, adenomatous colon polyps due for colonoscopy this year, chronic left sided abd pain/left flank pain with CTs unrevealing (suspected MSK in nature), thrombocytopenia followed by hematology.   Today: few months back had leaking from umbilicus and treated as infection by PCP. No more drainage. Some days umbilicus burns. Some days no pain.  Complains of generalized abdominal pain, mostly lower abdomen but often left-sided similar to in the past.  Unrelated to meals or bowel movements. BM about 3 times per day. No melena. Brbpr. Since mother died in 2023/07/18, felt like food not going down well and reflux a lot worse. Has settled down some since then. No N/V. Feels like regurgitation if bends over after eating.  Notably back in August due to her A1c being too low, she was taken off glipizide .  Her oral semaglutide was increased from 7 to 14 mg daily at that time.   Prior data:  CT chest without contrast 09/2022: IMPRESSION: 1. No pulmonary nodules requiring follow-up. 2. 1.5 cm low-attenuation right thyroid  nodule. Recommend thyroid  ultrasound. (Ref: J Am Coll Radiol. 2015 Feb;12(2): 143-50). 3. Hepatic steatosis.   CT A/P with contrast 08/2021: Unremarkable liver. Spleen normal.  Stable right renal cyst.   EGD April 2022 with normal-appearing esophagus but empiric dilation for history of dysphagia cannot be completed due to large amount of residual food in the stomach. She did not follow with recommendations for clear liquid diet after 6 PM the day prior.    EGD August 2022: Normal esophagus status post empiric dilation with mild resistance at 20 mm due to possible esophageal web, normal examined stomach and duodenum, small hiatal hernia.   Colonoscopy January 2020 with internal/external hemorrhoids, 3 mm cecal tubular adenoma removed. Next colonoscopy in 5 to10 years.   Medications   Current Outpatient Medications  Medication Sig Dispense Refill   atorvastatin  (LIPITOR ) 20 MG tablet Take 20 mg by mouth in the morning.     chlorthalidone (HYGROTON) 25 MG tablet Take 25 mg by mouth every morning.     Cholecalciferol (VITAMIN D3) 125 MCG (5000 UT) CAPS Take 5,000 Units by mouth in the morning.     cycloSPORINE  (RESTASIS ) 0.05 % ophthalmic emulsion Place 1 drop into both eyes 2 (two) times daily as needed (dry eyes).     dexlansoprazole  (DEXILANT ) 60 MG capsule Take one capsule daily before breakfast. 90 capsule 3   Diclofenac  Sodium 1.6 % GEL Apply 2 g topically 4 (four) times daily as needed. Apply to left elbow and lower bicep region 4 times daily 50 g 0   FARXIGA 10 MG TABS tablet Take 10 mg by mouth in the morning.     Fluocinolone Acetonide 0.01 % OIL Place 1 application in ear(s) 2 (two) times daily as needed (ear itching).     GE100 BLOOD GLUCOSE TEST test strip CHECK BLOOD SUGAR ONCE DAILY OR AS DIRECTED BY PHYSICIAN     ipratropium (ATROVENT) 0.03 % nasal spray Place 2 sprays into both nostrils 3 (three) times daily as  needed for rhinitis.     losartan (COZAAR) 100 MG tablet Take 100 mg by mouth in the morning.     metFORMIN  (GLUCOPHAGE -XR) 500 MG 24 hr tablet 1,000 mg 2 (two) times daily.     montelukast  (SINGULAIR ) 10 MG tablet TAKE 1 TABLET (10 MG TOTAL) BY MOUTH AT BEDTIME AS NEEDED (FOR ALLERGIES.). 90 tablet 1   nystatin cream (MYCOSTATIN) Apply 1 application topically 2 (two) times daily as needed (vaginal itching).     PARoxetine  (PAXIL ) 40 MG tablet TAKE ONE TABLET BY MOUTH EVERY MORNING. (Patient taking differently: Take 40 mg by  mouth in the morning.) 90 tablet 0   Semaglutide 14 MG TABS Take by mouth.     verapamil  (CALAN -SR) 240 MG CR tablet TAKE ONE TABLET BY MOUTH EVERY EVENING. (Patient taking differently: Take 240 mg by mouth in the morning.) 90 tablet 0   vitamin B-12 (CYANOCOBALAMIN ) 500 MCG tablet Take 500 mcg by mouth in the morning.     No current facility-administered medications for this visit.    Allergies   Allergies as of 10/30/2023 - Review Complete 10/30/2023  Allergen Reaction Noted   Prednisone Other (See Comments) 09/15/2016     Past Medical History   Past Medical History:  Diagnosis Date   Allergic rhinitis    Anxiety disorder    Arthritis    Gastroparesis    GERD (gastroesophageal reflux disease)    Headache    History of migraine    Hx of adenomatous colonic polyps    Hypertension    Iron deficiency anemia 09/22/2021   Thrombocytopenia (HCC)    Type 2 diabetes mellitus (HCC)    Not sure when she was diagnosed, greater than five years.     Past Surgical History   Past Surgical History:  Procedure Laterality Date   ABDOMINAL HYSTERECTOMY     has ovaries.    BALLOON DILATION N/A 05/17/2021   Procedure: BALLOON DILATION;  Surgeon: Cindie Carlin POUR, DO;  Location: [facility];  Service: Endoscopy;  Laterality: N/A;   BIOPSY  01/31/2021   Procedure: BIOPSY;  Surgeon: Cindie Carlin POUR, DO;  Location: [facility];  Service: Endoscopy;;   CARDIAC CATHETERIZATION N/A 08/03/2016   Procedure: Left Heart Cath and Coronary Angiography;  Surgeon: Peter M Jordan, MD;  Location: [facility]IVE CV [facility];  Service: Cardiovascular;  Laterality: N/A;   COLONOSCOPY WITH PROPOFOL  N/A 11/05/2018   Dr. Harvey: int/ext hemorrhoids, 3mm cecal tubular adenoma removed, next TCS in 5-10 years   ESOPHAGOGASTRODUODENOSCOPY (EGD) WITH PROPOFOL  N/A 11/05/2018   Dr. Harvey: esophagus was dilated, mild gastritis with benign bx   ESOPHAGOGASTRODUODENOSCOPY (EGD) WITH PROPOFOL  N/A 01/31/2021   Surgeon:  Cindie Carlin POUR, DO;  Normal esophagus, large amount of food residue in the stomach, gastritis biopsied (mild chronic gastritis, negative for H. pylori), normal examined duodenum s/p biopsy (benign).   ESOPHAGOGASTRODUODENOSCOPY (EGD) WITH PROPOFOL  N/A 05/17/2021   Surgeon: Cindie Carlin POUR, DO; normal esophagus s/p empiric dilation with mild resistance at 20 mm due to possible esophageal web, normal examined stomach and duodenum, small hiatal hernia.   Miinor skin surgery     POLYPECTOMY  11/05/2018   Procedure: POLYPECTOMY;  Surgeon: Harvey Margo CROME, MD;  Location: [facility];  Service: Endoscopy;;  cecal polyp   SAVORY DILATION N/A 11/05/2018   Procedure: SAVORY DILATION;  Surgeon: Harvey Margo CROME, MD;  Location: [facility];  Service: Endoscopy;  Laterality: N/A;    Past Family History  Family History  Problem Relation Age of Onset   Hypertension Mother    Diabetes Mother    Hypertension Son    Diabetes Son    Cancer Father    Cirrhosis Brother    Alcohol abuse Brother    Colon cancer Neg Hx     Past Social History   Social History   Socioeconomic History   Marital status: Single    Spouse name: Not on file   Number of children: 1   Years of education: Not on file   Highest education level: Not on file  Occupational History   Not on file  Tobacco Use   Smoking status: Never   Smokeless tobacco: Never  Vaping Use   Vaping status: Never Used  Substance and Sexual Activity   Alcohol use: No    Alcohol/week: 0.0 standard drinks of alcohol   Drug use: No   Sexual activity: Not Currently    Birth control/protection: Surgical  Other Topics Concern   Not on file  Social History Narrative   Reports is on disability. Has one son. Lives in [place] . Enjoys time with family. Enjoys archivist. Attends church.    Social Drivers of Corporate Investment Banker Strain: Low Risk  (01/17/2021)   Overall Financial Resource Strain (CARDIA)    Difficulty of Paying Living  Expenses: Not very hard  Food Insecurity: No Food Insecurity (01/17/2021)   Hunger Vital Sign    Worried About Running Out of Food in the Last Year: Never true    Ran Out of Food in the Last Year: Never true  Transportation Needs: No Transportation Needs (01/17/2021)   PRAPARE - Administrator, Civil Service (Medical): No    Lack of Transportation (Non-Medical): No  Physical Activity: Insufficiently Active (08/14/2023)   Received from [facility]   Exercise Vital Sign    Days of Exercise per Week: 4 days    Minutes of Exercise per Session: 20 min  Stress: No Stress Concern Present (08/11/2021)   Received from [facility] - Occupational Stress Questionnaire    Feeling of Stress : Only a little  Social Connections: Moderately Isolated (01/17/2021)   Social Connection and Isolation Panel [NHANES]    Frequency of Communication with Friends and Family: More than three times a week    Frequency of Social Gatherings with Friends and Family: More than three times a week    Attends Religious Services: More than 4 times per year    Active Member of [employer] or Organizations: No    Attends Engineer, Structural: Not on file    Marital Status: Never married  Intimate Partner Violence: Not At Risk (08/14/2023)   Received from [facility]   Humiliation, Afraid, Rape, and Kick questionnaire    Fear of Current or Ex-Partner: No    Emotionally Abused: No    Physically Abused: No    Sexually Abused: No    Review of Systems   General: Negative for anorexia, weight loss, fever, chills, fatigue, weakness. ENT: Negative for hoarseness, difficulty swallowing , nasal congestion. CV: Negative for chest pain, angina, palpitations, dyspnea on exertion, peripheral edema.  Respiratory: Negative for dyspnea at rest, dyspnea on exertion, cough, sputum, wheezing.  GI: See history of present illness. GU:  Negative for dysuria,  hematuria, urinary incontinence, urinary frequency, nocturnal urination.  Endo: Negative for unusual weight change.     Physical Exam  BP 122/77 (BP Location: Left Arm, Patient Position: Sitting, Cuff Size: Large)   Pulse 65   Temp 97.9 F (36.6 C) (Oral)   Ht 5' 1 (1.549 m)   Wt 179 lb 6.4 oz (81.4 kg)   SpO2 95%   BMI 33.90 kg/m    General: Well-nourished, well-developed in no acute distress.  Eyes: No icterus. Mouth: Oropharyngeal mucosa moist and pink  Lungs: Clear to auscultation bilaterally.  Heart: Regular rate and rhythm, no murmurs rubs or gallops.  Abdomen: Bowel sounds are normal, nontender, nondistended, no hepatosplenomegaly or masses,  no abdominal bruits or hernia , no rebound or guarding.  Rectal: not performed  Extremities: No lower extremity edema. No clubbing or deformities. Neuro: Alert and oriented x 4   Skin: Warm and dry, no jaundice.   Psych: Alert and cooperative, normal mood and affect.  Labs   Lab Results  Component Value Date   NA 143 02/12/2023   CL 104 02/12/2023   K 4.2 02/12/2023   CO2 24 02/12/2023   BUN 21 02/12/2023   CREATININE 1.23 (H) 02/12/2023   EGFR 49 (L) 02/12/2023   CALCIUM  10.5 (H) 02/12/2023   ALBUMIN 4.4 02/12/2023   GLUCOSE 92 02/12/2023   Lab Results  Component Value Date   ALT 26 02/12/2023   AST 41 (H) 02/12/2023   ALKPHOS 104 02/12/2023   BILITOT <0.2 02/12/2023   Lab Results  Component Value Date   WBC 5.6 08/22/2023   HGB 14.0 08/22/2023   HCT 44.1 08/22/2023   MCV 90.0 08/22/2023   PLT 91 (L) 08/22/2023   Lab Results  Component Value Date   IRON 85 08/22/2023   TIBC 405 08/22/2023   FERRITIN 113 08/22/2023   Lab Results  Component Value Date   VITAMINB12 708 02/22/2023   Lab Results  Component Value Date   FOLATE 13.0 05/12/2021   Labs from [facility] dated October 2024: Creatinine 1.25, EGFR 48, albumin 4.7, Imaging Studies   No results found.  Assessment   GERD/gastroparesis: -Seems  to have flared since increasing oral daily semaglutide -Encouraged multiple small meals/snacks throughout the day, avoiding fatty foods/raw veggies/spicy foods. -Add famotidine  20 mg at supper -Continue Dexilant  60 mg before breakfast -Call with persistent symptoms  History of adenomatous colon polyps: Due for surveillance at this time -ASA 3, ok for room 1/2.  I have discussed the risks, alternatives, benefits with regards to but not limited to the risk of reaction to medication, bleeding, infection, perforation and the patient is agreeable to proceed. Written consent to be obtained.  Thrombocytopenia: Followed by hematology.  History of fatty liver on prior imaging, CT chest.  No splenomegaly in 2022.  No evidence of portal hypertension on EGD in 2022.  She has had mildly elevated AST which is somewhat nonspecific.  See below for fatty liver instructions  Hepatic steatosis: Noted on prior imaging of the CT chest.  Chronic thrombocytopenia followed by hematology. -We will have her follow-up after her colonoscopy, update liver labs at that time, consider right upper quadrant ultrasound with elastography baseline  Generalized abdominal pain: Somewhat nonspecific.  Await colonoscopy findings.  Could consider updating CT imaging if needed.        Sonny RAMAN. Ezzard, MHS, PA-C [facility]

## 2023-10-30 NOTE — Progress Notes (Signed)
 GI Office Note    Referring Provider: Rolinda Millman, MD Primary Care Physician:  Rolinda Millman, MD  Primary Gastroenterologist: Carlin POUR. Cindie, DO   Chief Complaint   Chief Complaint  Patient presents with   Follow-up    Has some pain near naval at times    History of Present Illness   Barbara House is a 68 y.o. female presenting today for follow up. Last seen in 04/2023. H/o GERD, dysphagia s/p dilation, gastroparesis, adenomatous colon polyps due for colonoscopy this year, chronic left sided abd pain/left flank pain with CTs unrevealing (suspected MSK in nature), thrombocytopenia followed by hematology.   Today: few months back had leaking from umbilicus and treated as infection by PCP. No more drainage. Some days umbilicus burns. Some days no pain.  Complains of generalized abdominal pain, mostly lower abdomen but often left-sided similar to in the past.  Unrelated to meals or bowel movements. BM about 3 times per day. No melena. Brbpr. Since mother died in 2023/07/18, felt like food not going down well and reflux a lot worse. Has settled down some since then. No N/V. Feels like regurgitation if bends over after eating.  Notably back in August due to her A1c being too low, she was taken off glipizide .  Her oral semaglutide was increased from 7 to 14 mg daily at that time.   Prior data:  CT chest without contrast 09/2022: IMPRESSION: 1. No pulmonary nodules requiring follow-up. 2. 1.5 cm low-attenuation right thyroid  nodule. Recommend thyroid  ultrasound. (Ref: J Am Coll Radiol. 2015 Feb;12(2): 143-50). 3. Hepatic steatosis.   CT A/P with contrast 08/2021: Unremarkable liver. Spleen normal.  Stable right renal cyst.   EGD April 2022 with normal-appearing esophagus but empiric dilation for history of dysphagia cannot be completed due to large amount of residual food in the stomach. She did not follow with recommendations for clear liquid diet after 6 PM the day prior.    EGD August 2022: Normal esophagus status post empiric dilation with mild resistance at 20 mm due to possible esophageal web, normal examined stomach and duodenum, small hiatal hernia.   Colonoscopy January 2020 with internal/external hemorrhoids, 3 mm cecal tubular adenoma removed. Next colonoscopy in 5 to10 years.   Medications   Current Outpatient Medications  Medication Sig Dispense Refill   atorvastatin  (LIPITOR ) 20 MG tablet Take 20 mg by mouth in the morning.     chlorthalidone (HYGROTON) 25 MG tablet Take 25 mg by mouth every morning.     Cholecalciferol (VITAMIN D3) 125 MCG (5000 UT) CAPS Take 5,000 Units by mouth in the morning.     cycloSPORINE  (RESTASIS ) 0.05 % ophthalmic emulsion Place 1 drop into both eyes 2 (two) times daily as needed (dry eyes).     dexlansoprazole  (DEXILANT ) 60 MG capsule Take one capsule daily before breakfast. 90 capsule 3   Diclofenac  Sodium 1.6 % GEL Apply 2 g topically 4 (four) times daily as needed. Apply to left elbow and lower bicep region 4 times daily 50 g 0   FARXIGA 10 MG TABS tablet Take 10 mg by mouth in the morning.     Fluocinolone Acetonide 0.01 % OIL Place 1 application in ear(s) 2 (two) times daily as needed (ear itching).     GE100 BLOOD GLUCOSE TEST test strip CHECK BLOOD SUGAR ONCE DAILY OR AS DIRECTED BY PHYSICIAN     ipratropium (ATROVENT) 0.03 % nasal spray Place 2 sprays into both nostrils 3 (three) times daily as  needed for rhinitis.     losartan (COZAAR) 100 MG tablet Take 100 mg by mouth in the morning.     metFORMIN  (GLUCOPHAGE -XR) 500 MG 24 hr tablet 1,000 mg 2 (two) times daily.     montelukast  (SINGULAIR ) 10 MG tablet TAKE 1 TABLET (10 MG TOTAL) BY MOUTH AT BEDTIME AS NEEDED (FOR ALLERGIES.). 90 tablet 1   nystatin cream (MYCOSTATIN) Apply 1 application topically 2 (two) times daily as needed (vaginal itching).     PARoxetine  (PAXIL ) 40 MG tablet TAKE ONE TABLET BY MOUTH EVERY MORNING. (Patient taking differently: Take 40 mg by  mouth in the morning.) 90 tablet 0   Semaglutide 14 MG TABS Take by mouth.     verapamil  (CALAN -SR) 240 MG CR tablet TAKE ONE TABLET BY MOUTH EVERY EVENING. (Patient taking differently: Take 240 mg by mouth in the morning.) 90 tablet 0   vitamin B-12 (CYANOCOBALAMIN ) 500 MCG tablet Take 500 mcg by mouth in the morning.     No current facility-administered medications for this visit.    Allergies   Allergies as of 10/30/2023 - Review Complete 10/30/2023  Allergen Reaction Noted   Prednisone Other (See Comments) 09/15/2016     Past Medical History   Past Medical History:  Diagnosis Date   Allergic rhinitis    Anxiety disorder    Arthritis    Gastroparesis    GERD (gastroesophageal reflux disease)    Headache    History of migraine    Hx of adenomatous colonic polyps    Hypertension    Iron deficiency anemia 09/22/2021   Thrombocytopenia (HCC)    Type 2 diabetes mellitus (HCC)    Not sure when she was diagnosed, greater than five years.     Past Surgical History   Past Surgical History:  Procedure Laterality Date   ABDOMINAL HYSTERECTOMY     has ovaries.    BALLOON DILATION N/A 05/17/2021   Procedure: BALLOON DILATION;  Surgeon: Cindie Carlin POUR, DO;  [facility];  Service: Endoscopy;  Laterality: N/A;   BIOPSY  01/31/2021   Procedure: BIOPSY;  Surgeon: Cindie Carlin POUR, DO;  Location: [facility];  Service: Endoscopy;;   CARDIAC CATHETERIZATION N/A 08/03/2016   Procedure: Left Heart Cath and Coronary Angiography;  Surgeon: Peter M Jordan, MD;  Location: [facility] INVASIVE CV LAB;  Service: Cardiovascular;  Laterality: N/A;   COLONOSCOPY WITH PROPOFOL  N/A 11/05/2018   Dr. Harvey: int/ext hemorrhoids, 3mm cecal tubular adenoma removed, next TCS in 5-10 years   ESOPHAGOGASTRODUODENOSCOPY (EGD) WITH PROPOFOL  N/A 11/05/2018   Dr. Harvey: esophagus was dilated, mild gastritis with benign bx   ESOPHAGOGASTRODUODENOSCOPY (EGD) WITH PROPOFOL  N/A 01/31/2021   Surgeon:  Cindie Carlin POUR, DO;  Normal esophagus, large amount of food residue in the stomach, gastritis biopsied (mild chronic gastritis, negative for H. pylori), normal examined duodenum s/p biopsy (benign).   ESOPHAGOGASTRODUODENOSCOPY (EGD) WITH PROPOFOL  N/A 05/17/2021   Surgeon: Cindie Carlin POUR, DO; normal esophagus s/p empiric dilation with mild resistance at 20 mm due to possible esophageal web, normal examined stomach and duodenum, small hiatal hernia.   Miinor skin surgery     POLYPECTOMY  11/05/2018   Procedure: POLYPECTOMY;  Surgeon: Harvey Margo CROME, MD;  Location: [facility];  Service: Endoscopy;;  cecal polyp   SAVORY DILATION N/A 11/05/2018   Procedure: SAVORY DILATION;  Surgeon: Harvey Margo CROME, MD;  Location: [facility];  Service: Endoscopy;  Laterality: N/A;    Past Family History  Family History  Problem Relation Age of Onset   Hypertension Mother    Diabetes Mother    Hypertension Son    Diabetes Son    Cancer Father    Cirrhosis Brother    Alcohol abuse Brother    Colon cancer Neg Hx     Past Social History   Social History   Socioeconomic History   Marital status: Single    Spouse name: Not on file   Number of children: 1   Years of education: Not on file   Highest education level: Not on file  Occupational History   Not on file  Tobacco Use   Smoking status: Never   Smokeless tobacco: Never  Vaping Use   Vaping status: Never Used  Substance and Sexual Activity   Alcohol use: No    Alcohol/week: 0.0 standard drinks of alcohol   Drug use: No   Sexual activity: Not Currently    Birth control/protection: Surgical  Other Topics Concern   Not on file  Social History Narrative   Reports is on disability. Has one son. Lives in [place] . Enjoys time with family. Enjoys archivist. Attends church.    Social Drivers of Corporate Investment Banker Strain: Low Risk  (01/17/2021)   Overall Financial Resource Strain (CARDIA)    Difficulty of Paying Living  Expenses: Not very hard  Food Insecurity: No Food Insecurity (01/17/2021)   Hunger Vital Sign    Worried About Running Out of Food in the Last Year: Never true    Ran Out of Food in the Last Year: Never true  Transportation Needs: No Transportation Needs (01/17/2021)   PRAPARE - Administrator, Civil Service (Medical): No    Lack of Transportation (Non-Medical): No  Physical Activity: Insufficiently Active (08/14/2023)   Received from [facility]   Exercise Vital Sign    Days of Exercise per Week: 4 days    Minutes of Exercise per Session: 20 min  Stress: No Stress Concern Present (08/11/2021)   Received from [facility] - Occupational Stress Questionnaire    Feeling of Stress : Only a little  Social Connections: Moderately Isolated (01/17/2021)   Social Connection and Isolation Panel [NHANES]    Frequency of Communication with Friends and Family: More than three times a week    Frequency of Social Gatherings with Friends and Family: More than three times a week    Attends Religious Services: More than 4 times per year    Active Member of [employer] or Organizations: No    Attends Engineer, Structural: Not on file    Marital Status: Never married  Intimate Partner Violence: Not At Risk (08/14/2023)   Received from [facility]   Humiliation, Afraid, Rape, and Kick questionnaire    Fear of Current or Ex-Partner: No    Emotionally Abused: No    Physically Abused: No    Sexually Abused: No    Review of Systems   General: Negative for anorexia, weight loss, fever, chills, fatigue, weakness. ENT: Negative for hoarseness, difficulty swallowing , nasal congestion. CV: Negative for chest pain, angina, palpitations, dyspnea on exertion, peripheral edema.  Respiratory: Negative for dyspnea at rest, dyspnea on exertion, cough, sputum, wheezing.  GI: See history of present illness. GU:  Negative for dysuria,  hematuria, urinary incontinence, urinary frequency, nocturnal urination.  Endo: Negative for unusual weight change.     Physical Exam  BP 122/77 (BP Location: Left Arm, Patient Position: Sitting, Cuff Size: Large)   Pulse 65   Temp 97.9 F (36.6 C) (Oral)   Ht 5' 1 (1.549 m)   Wt 179 lb 6.4 oz (81.4 kg)   SpO2 95%   BMI 33.90 kg/m    General: Well-nourished, well-developed in no acute distress.  Eyes: No icterus. Mouth: Oropharyngeal mucosa moist and pink  Lungs: Clear to auscultation bilaterally.  Heart: Regular rate and rhythm, no murmurs rubs or gallops.  Abdomen: Bowel sounds are normal, nontender, nondistended, no hepatosplenomegaly or masses,  no abdominal bruits or hernia , no rebound or guarding.  Rectal: not performed  Extremities: No lower extremity edema. No clubbing or deformities. Neuro: Alert and oriented x 4   Skin: Warm and dry, no jaundice.   Psych: Alert and cooperative, normal mood and affect.  Labs   Lab Results  Component Value Date   NA 143 02/12/2023   CL 104 02/12/2023   K 4.2 02/12/2023   CO2 24 02/12/2023   BUN 21 02/12/2023   CREATININE 1.23 (H) 02/12/2023   EGFR 49 (L) 02/12/2023   CALCIUM  10.5 (H) 02/12/2023   ALBUMIN 4.4 02/12/2023   GLUCOSE 92 02/12/2023   Lab Results  Component Value Date   ALT 26 02/12/2023   AST 41 (H) 02/12/2023   ALKPHOS 104 02/12/2023   BILITOT <0.2 02/12/2023   Lab Results  Component Value Date   WBC 5.6 08/22/2023   HGB 14.0 08/22/2023   HCT 44.1 08/22/2023   MCV 90.0 08/22/2023   PLT 91 (L) 08/22/2023   Lab Results  Component Value Date   IRON 85 08/22/2023   TIBC 405 08/22/2023   FERRITIN 113 08/22/2023   Lab Results  Component Value Date   VITAMINB12 708 02/22/2023   Lab Results  Component Value Date   FOLATE 13.0 05/12/2021   Labs from [facility] dated October 2024: Creatinine 1.25, EGFR 48, albumin 4.7, Imaging Studies   No results found.  Assessment   GERD/gastroparesis: -Seems  to have flared since increasing oral daily semaglutide -Encouraged multiple small meals/snacks throughout the day, avoiding fatty foods/raw veggies/spicy foods. -Add famotidine  20 mg at supper -Continue Dexilant  60 mg before breakfast -Call with persistent symptoms  History of adenomatous colon polyps: Due for surveillance at this time -ASA 3, ok for room 1/2.  I have discussed the risks, alternatives, benefits with regards to but not limited to the risk of reaction to medication, bleeding, infection, perforation and the patient is agreeable to proceed. Written consent to be obtained.  Thrombocytopenia: Followed by hematology.  History of fatty liver on prior imaging, CT chest.  No splenomegaly in 2022.  No evidence of portal hypertension on EGD in 2022.  She has had mildly elevated AST which is somewhat nonspecific.  See below for fatty liver instructions  Hepatic steatosis: Noted on prior imaging of the CT chest.  Chronic thrombocytopenia followed by hematology. -We will have her follow-up after her colonoscopy, update liver labs at that time, consider right upper quadrant ultrasound with elastography baseline  Generalized abdominal pain: Somewhat nonspecific.  Await colonoscopy findings.  Could consider updating CT imaging if needed.        Barbara House, MHS, PA-C [facility]

## 2023-10-30 NOTE — Patient Instructions (Addendum)
 Continue Dexilant  60mg  daily before breakfast. Add famotidine  20mg  at supper.  I suspect some of your reflux symptoms are worsened by delayed stomach emptying.  Try eating smaller meals/snacks instead of larger meals.  You can eat small meals several times throughout the day (5-6 times) to avoid putting too much food in your stomach at any 1 time.  Avoid raw vegetables, spicy foods, high fat foods. Call if your symptoms do not improve. Colonoscopy to be scheduled. If nothing found on colonoscopy to explain your generalized abdominal pain, we could consider CT scan as a next step.

## 2023-11-09 DIAGNOSIS — M25511 Pain in right shoulder: Secondary | ICD-10-CM | POA: Diagnosis not present

## 2023-11-09 DIAGNOSIS — R590 Localized enlarged lymph nodes: Secondary | ICD-10-CM | POA: Diagnosis not present

## 2023-11-14 NOTE — Telephone Encounter (Signed)
Patient already seen in clinic.

## 2023-11-20 NOTE — Patient Instructions (Addendum)
 Barbara House  11/20/2023       Your procedure is scheduled on 11/26/23.  Report to [facility] at 6:45  A.M.  Call this number if you have problems the morning of surgery:  [phone]  If you experience any cold or flu symptoms such as cough, fever, chills, shortness of breath, etc. between now and your scheduled surgery, please notify us  at the above number.   Remember: FOLLOW COLON PREP INSTRUCTIONS GIVEN TO YOU   Do not eat after midnight.   You may drink clear liquids until 4:45 am .  Clear liquids allowed are:   Water , Juice (No red color; non-citric and without pulp; diabetics please choose diet or no sugar options), Carbonated beverages (diabetics please choose diet or no sugar options), Clear Tea (No creamer, milk, or cream, including half & half and powdered creamer), Black Coffee Only (No creamer, milk or cream, including half & half and powdered creamer), Plain Jell-O Only (No red color; diabetics please choose no sugar options), and Clear Sports drink (No red color; diabetics please choose diet or no sugar options)    Take these medicines the morning of surgery with A SIP OF WATER     dexlansoprazole  (DEXILANT )   atorvastatin  (LIPITOR )   PARoxetine  (PAXIL )   verapamil     STOP TAKING FARXIGA 3 DAYS PRIOR TO PROCEDURE-LAST DOSE ON 11/22/23  STOP TAKING SEMAGLUTIDE 24 HOURS PRIOR TO PROCEDURE-LAST DOSE ON 11/24/23   Do not wear jewelry, make-up or nail polish, including gel polish,  artificial nails, or any other type of covering on natural nails (fingers and  toes).  Do not wear lotions, powders, or perfumes, or deodorant.  Do not shave 48 hours prior to surgery.  Do not bring valuables to the hospital.  [facility] is not responsible for any belongings or valuables.  Contacts, dentures or bridgework may not be worn into surgery.  Leave your suitcase in the car.  After surgery it may be brought to your room.  For patients admitted to the hospital, discharge time will  be determined by your treatment team.  Patients discharged the day of surgery will not be allowed to drive home.    Please read over the following fact sheets that you were given. Anesthesia Post-op Instructions  Colonoscopy, Adult, Care After The following information offers guidance on how to care for yourself after your procedure. Your health care provider may also give you more specific instructions. If you have problems or questions, contact your health care provider. What can I expect after the procedure? After the procedure, it is common to have: A small amount of blood in your stool for 24 hours after the procedure. Some gas. Mild cramping or bloating of your abdomen. Follow these instructions at home: Eating and drinking  Drink enough fluid to keep your urine pale yellow. Follow instructions from your health care provider about eating or drinking restrictions. Resume your normal diet as told by your health care provider. Avoid heavy or fried foods that are hard to digest. Activity Rest as told by your health care provider. Avoid sitting for a long time without moving. Get up to take short walks every 1-2 hours. This is important to improve blood flow and breathing. Ask for help if you feel weak or unsteady. Return to your normal activities as told by your health care provider. Ask your health care provider what activities are safe for you. Managing cramping and bloating  Try walking around when you have cramps  or feel bloated. If directed, apply heat to your abdomen as told by your health care provider. Use the heat source that your health care provider recommends, such as a moist heat pack or a heating pad. Place a towel between your skin and the heat source. Leave the heat on for 20-30 minutes. Remove the heat if your skin turns bright red. This is especially important if you are unable to feel pain, heat, or cold. You have a greater risk of getting burned. General  instructions If you were given a sedative during the procedure, it can affect you for several hours. Do not drive or operate machinery until your health care provider says that it is safe. For the first 24 hours after the procedure: Do not sign important documents. Do not drink alcohol. Do your regular daily activities at a slower pace than normal. Eat soft foods that are easy to digest. Take over-the-counter and prescription medicines only as told by your health care provider. Keep all follow-up visits. This is important. Contact a health care provider if: You have blood in your stool 2-3 days after the procedure. Get help right away if: You have more than a small spotting of blood in your stool. You have large blood clots in your stool. You have swelling of your abdomen. You have nausea or vomiting. You have a fever. You have increasing pain in your abdomen that is not relieved with medicine. These symptoms may be an emergency. Get help right away. Call 911. Do not wait to see if the symptoms will go away. Do not drive yourself to the hospital. Summary After the procedure, it is common to have a small amount of blood in your stool. You may also have mild cramping and bloating of your abdomen. If you were given a sedative during the procedure, it can affect you for several hours. Do not drive or operate machinery until your health care provider says that it is safe. Get help right away if you have a lot of blood in your stool, nausea or vomiting, a fever, or increased pain in your abdomen. This information is not intended to replace advice given to you by your health care provider. Make sure you discuss any questions you have with your health care provider. Document Revised: 11/14/2022 Document Reviewed: 05/25/2021 Elsevier Patient Education  2024 Elsevier Inc.  Monitored Anesthesia Care, Care After The following information offers guidance on how to care for yourself after your  procedure. Your health care provider may also give you more specific instructions. If you have problems or questions, contact your health care provider. What can I expect after the procedure? After the procedure, it is common to have: Tiredness. Little or no memory about what happened during or after the procedure. Impaired judgment when it comes to making decisions. Nausea or vomiting. Some trouble with balance. Follow these instructions at home: For the time period you were told by your health care provider:  Rest. Do not participate in activities where you could fall or become injured. Do not drive or use machinery. Do not drink alcohol. Do not take sleeping pills or medicines that cause drowsiness. Do not make important decisions or sign legal documents. Do not take care of children on your own. Medicines Take over-the-counter and prescription medicines only as told by your health care provider. If you were prescribed antibiotics, take them as told by your health care provider. Do not stop using the antibiotic even if you start to feel better. Eating  and drinking Follow instructions from your health care provider about what you may eat and drink. Drink enough fluid to keep your urine pale yellow. If you vomit: Drink clear fluids slowly and in small amounts as you are able. Clear fluids include water , ice chips, low-calorie sports drinks, and fruit juice that has water  added to it (diluted fruit juice). Eat light and bland foods in small amounts as you are able. These foods include bananas, applesauce, rice, lean meats, toast, and crackers. General instructions  Have a responsible adult stay with you for the time you are told. It is important to have someone help care for you until you are awake and alert. If you have sleep apnea, surgery and some medicines can increase your risk for breathing problems. Follow instructions from your health care provider about wearing your sleep  device: When you are sleeping. This includes during daytime naps. While taking prescription pain medicines, sleeping medicines, or medicines that make you drowsy. Do not use any products that contain nicotine or tobacco. These products include cigarettes, chewing tobacco, and vaping devices, such as e-cigarettes. If you need help quitting, ask your health care provider. Contact a health care provider if: You feel nauseous or vomit every time you eat or drink. You feel light-headed. You are still sleepy or having trouble with balance after 24 hours. You get a rash. You have a fever. You have redness or swelling around the IV site. Get help right away if: You have trouble breathing. You have new confusion after you get home. These symptoms may be an emergency. Get help right away. Call 911. Do not wait to see if the symptoms will go away. Do not drive yourself to the hospital. This information is not intended to replace advice given to you by your health care provider. Make sure you discuss any questions you have with your health care provider. Document Revised: 02/27/2022 Document Reviewed: 02/27/2022 Elsevier Patient Education  2024 [employer].

## 2023-11-21 ENCOUNTER — Encounter (HOSPITAL_COMMUNITY): Payer: Self-pay

## 2023-11-21 ENCOUNTER — Encounter (HOSPITAL_COMMUNITY)
Admission: RE | Admit: 2023-11-21 | Discharge: 2023-11-21 | Disposition: A | Discharge status: Home / Self Care | Payer: Medicare Other | Source: Ambulatory Visit | Attending: Internal Medicine | Admitting: Internal Medicine

## 2023-11-21 VITALS — BP 115/71 | HR 69 | Temp 98.0°F

## 2023-11-21 DIAGNOSIS — N182 Chronic kidney disease, stage 2 (mild): Secondary | ICD-10-CM | POA: Diagnosis not present

## 2023-11-21 DIAGNOSIS — Z01818 Encounter for other preprocedural examination: Secondary | ICD-10-CM | POA: Insufficient documentation

## 2023-11-21 DIAGNOSIS — E1122 Type 2 diabetes mellitus with diabetic chronic kidney disease: Secondary | ICD-10-CM | POA: Insufficient documentation

## 2023-11-21 DIAGNOSIS — I10 Essential (primary) hypertension: Secondary | ICD-10-CM | POA: Diagnosis not present

## 2023-11-21 LAB — BASIC METABOLIC PANEL
Anion gap: 13 (ref 5–15)
BUN: 30 mg/dL — ABNORMAL HIGH (ref 8–23)
CO2: 24 mmol/L (ref 22–32)
Calcium: 10.4 mg/dL — ABNORMAL HIGH (ref 8.9–10.3)
Chloride: 101 mmol/L (ref 98–111)
Creatinine, Ser: 1.25 mg/dL — ABNORMAL HIGH (ref 0.44–1.00)
GFR, Estimated: 48 mL/min — ABNORMAL LOW (ref >60)
Glucose, Bld: 141 mg/dL — ABNORMAL HIGH (ref 70–99)
Potassium: 3.7 mmol/L (ref 3.5–5.1)
Sodium: 138 mmol/L (ref 135–145)

## 2023-11-26 ENCOUNTER — Ambulatory Visit (HOSPITAL_COMMUNITY): Payer: Medicare Other | Admitting: Anesthesiology

## 2023-11-26 ENCOUNTER — Encounter (HOSPITAL_COMMUNITY): Payer: Self-pay | Admitting: Hematology

## 2023-11-26 ENCOUNTER — Encounter (HOSPITAL_COMMUNITY): Payer: Self-pay | Admitting: Internal Medicine

## 2023-11-26 ENCOUNTER — Ambulatory Visit (HOSPITAL_COMMUNITY)
Admission: RE | Admit: 2023-11-26 | Discharge: 2023-11-26 | Disposition: A | Discharge status: Home / Self Care | Payer: Medicare Other | Attending: Internal Medicine | Admitting: Internal Medicine

## 2023-11-26 ENCOUNTER — Other Ambulatory Visit: Payer: Self-pay

## 2023-11-26 ENCOUNTER — Encounter (HOSPITAL_COMMUNITY)
Admission: RE | Disposition: A | Discharge status: Home / Self Care | Payer: Self-pay | Source: Home / Self Care | Attending: Internal Medicine

## 2023-11-26 DIAGNOSIS — D123 Benign neoplasm of transverse colon: Secondary | ICD-10-CM

## 2023-11-26 DIAGNOSIS — I10 Essential (primary) hypertension: Secondary | ICD-10-CM | POA: Insufficient documentation

## 2023-11-26 DIAGNOSIS — D696 Thrombocytopenia, unspecified: Secondary | ICD-10-CM | POA: Insufficient documentation

## 2023-11-26 DIAGNOSIS — Z7984 Long term (current) use of oral hypoglycemic drugs: Secondary | ICD-10-CM | POA: Diagnosis not present

## 2023-11-26 DIAGNOSIS — K635 Polyp of colon: Secondary | ICD-10-CM | POA: Insufficient documentation

## 2023-11-26 DIAGNOSIS — E1143 Type 2 diabetes mellitus with diabetic autonomic (poly)neuropathy: Secondary | ICD-10-CM | POA: Insufficient documentation

## 2023-11-26 DIAGNOSIS — Z1211 Encounter for screening for malignant neoplasm of colon: Secondary | ICD-10-CM | POA: Insufficient documentation

## 2023-11-26 DIAGNOSIS — K219 Gastro-esophageal reflux disease without esophagitis: Secondary | ICD-10-CM | POA: Insufficient documentation

## 2023-11-26 DIAGNOSIS — Z860101 Personal history of adenomatous and serrated colon polyps: Secondary | ICD-10-CM | POA: Diagnosis present

## 2023-11-26 DIAGNOSIS — K648 Other hemorrhoids: Secondary | ICD-10-CM | POA: Diagnosis not present

## 2023-11-26 DIAGNOSIS — N289 Disorder of kidney and ureter, unspecified: Secondary | ICD-10-CM | POA: Insufficient documentation

## 2023-11-26 DIAGNOSIS — Z860100 Personal history of colon polyps, unspecified: Secondary | ICD-10-CM | POA: Diagnosis not present

## 2023-11-26 DIAGNOSIS — K3184 Gastroparesis: Secondary | ICD-10-CM | POA: Insufficient documentation

## 2023-11-26 DIAGNOSIS — F419 Anxiety disorder, unspecified: Secondary | ICD-10-CM | POA: Diagnosis not present

## 2023-11-26 DIAGNOSIS — E119 Type 2 diabetes mellitus without complications: Secondary | ICD-10-CM | POA: Diagnosis not present

## 2023-11-26 HISTORY — PX: COLONOSCOPY WITH PROPOFOL: SHX5780

## 2023-11-26 HISTORY — PX: POLYPECTOMY: SHX5525

## 2023-11-26 LAB — GLUCOSE, CAPILLARY
Glucose-Capillary: 64 mg/dL — ABNORMAL LOW (ref 70–99)
Glucose-Capillary: 89 mg/dL (ref 70–99)
Glucose-Capillary: 93 mg/dL (ref 70–99)

## 2023-11-26 SURGERY — COLONOSCOPY WITH PROPOFOL
Anesthesia: General

## 2023-11-26 MED ORDER — LACTATED RINGERS IV SOLN: INTRAVENOUS | Status: DC | PRN

## 2023-11-26 MED ORDER — LACTATED RINGERS IV SOLN: INTRAVENOUS | Status: DC

## 2023-11-26 MED ORDER — DEXTROSE 50 % IV SOLN: 25.0000 mL | Freq: Once | INTRAVENOUS | Status: AC

## 2023-11-26 MED ORDER — PROPOFOL 10 MG/ML IV BOLUS
INTRAVENOUS | Status: DC | PRN
  Administered 2023-11-26: 50 mg via INTRAVENOUS
  Administered 2023-11-26: 100 mg via INTRAVENOUS

## 2023-11-26 MED ORDER — DEXTROSE 50 % IV SOLN
INTRAVENOUS | Status: AC
  Administered 2023-11-26: 25 mL via INTRAVENOUS
  Filled 2023-11-26: qty 50

## 2023-11-26 MED ORDER — PROPOFOL 500 MG/50ML IV EMUL
INTRAVENOUS | Status: DC | PRN
  Administered 2023-11-26: 200 ug/kg/min via INTRAVENOUS

## 2023-11-26 NOTE — Addendum Note (Signed)
 Addendum  created 11/26/23 1400 by Verline Glow, CRNA   Clinical Note Signed, Flowsheet accepted

## 2023-11-26 NOTE — Anesthesia Preprocedure Evaluation (Signed)
 Anesthesia Evaluation  Patient identified by MRN, date of birth, ID band Patient awake    Reviewed: Allergy & Precautions, H&P , NPO status , Patient's Chart, lab work & pertinent test results, reviewed documented beta blocker date and time   Airway Mallampati: II  TM Distance: >3 FB Neck ROM: full    Dental no notable dental hx.    Pulmonary neg pulmonary ROS   Pulmonary exam normal breath sounds clear to auscultation       Cardiovascular Exercise Tolerance: Good hypertension, + angina  negative cardio ROS  Rhythm:regular Rate:Normal     Neuro/Psych  Headaches PSYCHIATRIC DISORDERS Anxiety     negative neurological ROS  negative psych ROS   GI/Hepatic negative GI ROS, Neg liver ROS,GERD  ,,  Endo/Other  negative endocrine ROSdiabetes    Renal/GU Renal diseasenegative Renal ROS  negative genitourinary   Musculoskeletal   Abdominal   Peds  Hematology negative hematology ROS (+) Blood dyscrasia, anemia   Anesthesia Other Findings   Reproductive/Obstetrics negative OB ROS                             Anesthesia Physical Anesthesia Plan  ASA: 2  Anesthesia Plan: General   Post-op Pain Management:    Induction:   PONV Risk Score and Plan: Propofol  infusion  Airway Management Planned:   Additional Equipment:   Intra-op Plan:   Post-operative Plan:   Informed Consent: I have reviewed the patients History and Physical, chart, labs and discussed the procedure including the risks, benefits and alternatives for the proposed anesthesia with the patient or authorized representative who has indicated his/her understanding and acceptance.     Dental Advisory Given  Plan Discussed with: CRNA  Anesthesia Plan Comments:        Anesthesia Quick Evaluation

## 2023-11-26 NOTE — Op Note (Signed)
 [facility] Patient Name: Barbara House Procedure Date: 11/26/2023 8:12 AM MRN: [id number] Date of Birth: 1956-01-13 Attending MD: Rolando Cliche. Mordechai April , [place], [id number] CSN: [id number] Age: 68 Admit Type: Outpatient Procedure:                Colonoscopy Indications:              Surveillance: Personal history of adenomatous                            polyps on last colonoscopy 5 years ago Providers:                Rolando Cliche. Mordechai April, DO, Vonna Guardian, Theola Fitch Referring MD:              Medicines:                See the Anesthesia note for documentation of the                            administered medications Complications:            No immediate complications. Estimated Blood Loss:     Estimated blood loss was minimal. Procedure:                Pre-Anesthesia Assessment:                           - The anesthesia plan was to use monitored                            anesthesia care (MAC).                           After obtaining informed consent, the colonoscope                            was passed under direct vision. Throughout the                            procedure, the patient's blood pressure, pulse, and                            oxygen  saturations were monitored continuously. The                            [id number]-[id number] ([id number]) scope was introduced through                            the anus and advanced to the the cecum, identified                            by appendiceal orifice and ileocecal valve. The                            patient tolerated the procedure well. The quality  of the bowel preparation was evaluated using the                            BBPS [facility] Bowel Preparation Scale) with scores                            of: Right Colon = 3 (entire mucosa seen well with                            no residual staining, small fragments of stool or                            opaque liquid), Transverse Colon = 2 (minor amount                             of residual staining, small fragments of stool                            and/or opaque liquid, but mucosa seen well) and                            Left Colon = 2 (minor amount of residual staining,                            small fragments of stool and/or opaque liquid, but                            mucosa seen well). The total BBPS score equals 7.                            The quality of the bowel preparation was good. The                            colonoscopy was somewhat difficult due to                            significant looping and a tortuous colon.                            Successful completion of the procedure was aided by                            applying abdominal pressure. Scope In: 8:28:16 AM Scope Out: 8:45:44 AM Scope Withdrawal Time: 0 hours 6 minutes 59 seconds  Total Procedure Duration: 0 hours 17 minutes 28 seconds  Findings:      Non-bleeding internal hemorrhoids were found during endoscopy.      A 5 mm polyp was found in the proximal transverse colon. The polyp was       sessile. The polyp was removed with a cold snare. Resection and       retrieval were complete.      The exam was otherwise without abnormality. Impression:               -  Non-bleeding internal hemorrhoids.                           - One 5 mm polyp in the proximal transverse colon,                            removed with a cold snare. Resected and retrieved.                           - The examination was otherwise normal. Moderate Sedation:      Per Anesthesia Care Recommendation:           - Patient has a contact number available for                            emergencies. The signs and symptoms of potential                            delayed complications were discussed with the                            patient. Return to normal activities tomorrow.                            Written discharge instructions were provided to the                             patient.                           - Resume previous diet.                           - Continue present medications.                           - Await pathology results.                           - Repeat colonoscopy in 7 years for surveillance.                           - Return to GI clinic in 3 months. Procedure Code(s):        --- Professional ---                           [id number], Colonoscopy, flexible; with removal of                            tumor(s), polyp(s), or other lesion(s) by snare                            technique Diagnosis Code(s):        --- Professional ---  Z86.010, Personal history of colonic polyps                           D12.3, Benign neoplasm of transverse colon (hepatic                            flexure or splenic flexure)                           K64.8, Other hemorrhoids CPT copyright 2022 American Medical Association. All rights reserved. The codes documented in this report are preliminary and upon coder review may  be revised to meet current compliance requirements. Rolando Cliche. Mordechai April, DO Rolando Cliche. Melvyn Hommes, DO 11/26/2023 8:50:12 AM This report has been signed electronically. Number of Addenda: 0

## 2023-11-26 NOTE — Discharge Instructions (Addendum)
  Colonoscopy Discharge Instructions  Read the instructions outlined below and refer to this sheet in the next few weeks. These discharge instructions provide you with general information on caring for yourself after you leave the hospital. Your doctor may also give you specific instructions. While your treatment has been planned according to the most current medical practices available, unavoidable complications occasionally occur.   ACTIVITY You may resume your regular activity, but move at a slower pace for the next 24 hours.  Take frequent rest periods for the next 24 hours.  Walking will help get rid of the air and reduce the bloated feeling in your belly (abdomen).  No driving for 24 hours (because of the medicine (anesthesia) used during the test).   Do not sign any important legal documents or operate any machinery for 24 hours (because of the anesthesia used during the test).  NUTRITION Drink plenty of fluids.  You may resume your normal diet as instructed by your doctor.  Begin with a light meal and progress to your normal diet. Heavy or fried foods are harder to digest and may make you feel sick to your stomach (nauseated).  Avoid alcoholic beverages for 24 hours or as instructed.  MEDICATIONS You may resume your normal medications unless your doctor tells you otherwise.  WHAT YOU CAN EXPECT TODAY Some feelings of bloating in the abdomen.  Passage of more gas than usual.  Spotting of blood in your stool or on the toilet paper.  IF YOU HAD POLYPS REMOVED DURING THE COLONOSCOPY: No aspirin  products for 7 days or as instructed.  No alcohol for 7 days or as instructed.  Eat a soft diet for the next 24 hours.  FINDING OUT THE RESULTS OF YOUR TEST Not all test results are available during your visit. If your test results are not back during the visit, make an appointment with your caregiver to find out the results. Do not assume everything is normal if you have not heard from your  caregiver or the medical facility. It is important for you to follow up on all of your test results.  SEEK IMMEDIATE MEDICAL ATTENTION IF: You have more than a spotting of blood in your stool.  Your belly is swollen (abdominal distention).  You are nauseated or vomiting.  You have a temperature over 101.  You have abdominal pain or discomfort that is severe or gets worse throughout the day.   Your colonoscopy revealed 1 polyp(s) which I removed successfully. Await pathology results, my office will contact you. I recommend repeating colonoscopy in 7 years for surveillance purposes.   Follow up in GI office in 3-4 months.   I hope you have a great rest of your week!  Rolando Cliche. Mordechai April, D.O. Gastroenterology and Hepatology [facility]

## 2023-11-26 NOTE — Interval H&P Note (Signed)
 History and Physical Interval Note:  11/26/2023 7:49 AM  Bartolo Lights  has presented today for surgery, with the diagnosis of HX: colon polyps.  The various methods of treatment have been discussed with the patient and family. After consideration of risks, benefits and other options for treatment, the patient has consented to  Procedure(s) with comments: COLONOSCOPY WITH PROPOFOL  (N/A) - 8:45 am, asa 3 as a surgical intervention.  The patient's history has been reviewed, patient examined, no change in status, stable for surgery.  I have reviewed the patient's chart and labs.  Questions were answered to the patient's satisfaction.     Barbara House

## 2023-11-26 NOTE — Anesthesia Postprocedure Evaluation (Signed)
 Anesthesia Post Note  Patient: Barbara House  Procedure(s) Performed: COLONOSCOPY WITH PROPOFOL  POLYPECTOMY  Patient location during evaluation: Phase II Anesthesia Type: General Level of consciousness: awake Pain management: pain level controlled Vital Signs Assessment: post-procedure vital signs reviewed and stable Respiratory status: spontaneous breathing and respiratory function stable Cardiovascular status: blood pressure returned to baseline and stable Postop Assessment: no headache and no apparent nausea or vomiting Anesthetic complications: no Comments: Late entry   No notable events documented.   Last Vitals:  Vitals:   11/26/23 0854 11/26/23 0957  BP: (!) 143/87   Pulse: 80   Resp: 18   Temp:  (!) 36.4 C  SpO2: 100%     Last Pain:  Vitals:   11/26/23 0957  TempSrc: Oral  PainSc:                  Coretha Dew

## 2023-11-26 NOTE — Transfer of Care (Addendum)
 Immediate Anesthesia Transfer of Care Note  Patient: Barbara House  Procedure(s) Performed: COLONOSCOPY WITH PROPOFOL  POLYPECTOMY  Patient Location: Short Stay  Anesthesia Type:General  Level of Consciousness: awake and patient cooperative  Airway & Oxygen  Therapy: Patient Spontanous Breathing  Post-op Assessment: Report given to RN and Post -op Vital signs reviewed and stable  Post vital signs: Reviewed and stable  Last Vitals:  Vitals Value Taken Time  BP 143/87 11/26/23 0852  Temp 35.8 C 11/26/23 0851  Pulse 84 11/26/23 0855  Resp 18 11/26/23 0855  SpO2 100 % 11/26/23 0855  Vitals shown include unfiled device data.  Last Pain:  Vitals:   11/26/23 0851  TempSrc: Axillary  PainSc: 0-No pain         Complications: patient temp 35.8 on arrival to shortstay. Temp. 97.5 oral at 1007 11/26/2023.

## 2023-11-27 ENCOUNTER — Encounter (HOSPITAL_COMMUNITY): Payer: Self-pay | Admitting: Internal Medicine

## 2023-11-28 LAB — SURGICAL PATHOLOGY

## 2023-11-29 DIAGNOSIS — M7741 Metatarsalgia, right foot: Secondary | ICD-10-CM | POA: Diagnosis not present

## 2023-11-29 DIAGNOSIS — N200 Calculus of kidney: Secondary | ICD-10-CM | POA: Diagnosis not present

## 2023-11-29 DIAGNOSIS — L602 Onychogryphosis: Secondary | ICD-10-CM | POA: Diagnosis not present

## 2023-11-29 DIAGNOSIS — B351 Tinea unguium: Secondary | ICD-10-CM | POA: Diagnosis not present

## 2023-11-29 DIAGNOSIS — L600 Ingrowing nail: Secondary | ICD-10-CM | POA: Diagnosis not present

## 2023-11-29 DIAGNOSIS — L603 Nail dystrophy: Secondary | ICD-10-CM | POA: Diagnosis not present

## 2023-11-29 DIAGNOSIS — E1149 Type 2 diabetes mellitus with other diabetic neurological complication: Secondary | ICD-10-CM | POA: Diagnosis not present

## 2023-11-29 DIAGNOSIS — L84 Corns and callosities: Secondary | ICD-10-CM | POA: Diagnosis not present

## 2023-11-29 DIAGNOSIS — M2042 Other hammer toe(s) (acquired), left foot: Secondary | ICD-10-CM | POA: Diagnosis not present

## 2023-11-29 DIAGNOSIS — M2041 Other hammer toe(s) (acquired), right foot: Secondary | ICD-10-CM | POA: Diagnosis not present

## 2023-11-29 DIAGNOSIS — N393 Stress incontinence (female) (male): Secondary | ICD-10-CM | POA: Diagnosis not present

## 2023-12-26 DIAGNOSIS — M79675 Pain in left toe(s): Secondary | ICD-10-CM | POA: Diagnosis not present

## 2023-12-26 DIAGNOSIS — L853 Xerosis cutis: Secondary | ICD-10-CM | POA: Diagnosis not present

## 2023-12-26 DIAGNOSIS — E669 Obesity, unspecified: Secondary | ICD-10-CM | POA: Diagnosis not present

## 2023-12-26 DIAGNOSIS — L859 Epidermal thickening, unspecified: Secondary | ICD-10-CM | POA: Diagnosis not present

## 2023-12-26 DIAGNOSIS — M79674 Pain in right toe(s): Secondary | ICD-10-CM | POA: Diagnosis not present

## 2023-12-26 DIAGNOSIS — E1169 Type 2 diabetes mellitus with other specified complication: Secondary | ICD-10-CM | POA: Diagnosis not present

## 2024-01-27 ENCOUNTER — Encounter (HOSPITAL_COMMUNITY): Payer: Self-pay | Admitting: Hematology

## 2024-02-19 DIAGNOSIS — M79675 Pain in left toe(s): Secondary | ICD-10-CM | POA: Diagnosis not present

## 2024-02-19 DIAGNOSIS — L84 Corns and callosities: Secondary | ICD-10-CM | POA: Diagnosis not present

## 2024-02-19 DIAGNOSIS — M79674 Pain in right toe(s): Secondary | ICD-10-CM | POA: Diagnosis not present

## 2024-02-19 DIAGNOSIS — B351 Tinea unguium: Secondary | ICD-10-CM | POA: Diagnosis not present

## 2024-02-19 DIAGNOSIS — L602 Onychogryphosis: Secondary | ICD-10-CM | POA: Diagnosis not present

## 2024-02-19 DIAGNOSIS — M2041 Other hammer toe(s) (acquired), right foot: Secondary | ICD-10-CM | POA: Diagnosis not present

## 2024-02-19 DIAGNOSIS — L603 Nail dystrophy: Secondary | ICD-10-CM | POA: Diagnosis not present

## 2024-02-19 DIAGNOSIS — E1149 Type 2 diabetes mellitus with other diabetic neurological complication: Secondary | ICD-10-CM | POA: Diagnosis not present

## 2024-02-19 DIAGNOSIS — M2042 Other hammer toe(s) (acquired), left foot: Secondary | ICD-10-CM | POA: Diagnosis not present

## 2024-02-25 DIAGNOSIS — E118 Type 2 diabetes mellitus with unspecified complications: Secondary | ICD-10-CM | POA: Diagnosis not present

## 2024-02-25 DIAGNOSIS — R42 Dizziness and giddiness: Secondary | ICD-10-CM | POA: Diagnosis not present

## 2024-02-26 ENCOUNTER — Inpatient Hospital Stay: Payer: Medicare Other

## 2024-02-26 NOTE — Progress Notes (Unsigned)
 GI Office Note    Referring Provider: Dorena Gander, MD Primary Care Physician:  Dorena Gander, MD  Primary Gastroenterologist:  Chief Complaint   No chief complaint on file.   History of Present Illness   Barbara House is a 68 y.o. female presenting today for follow up. H/o GERD, dysphagia s/p dilation, gastroparesis,hepatic steatosis,  adenomatous colon polyps, chronic left sided abd pain/left flank pain with CTs unrevealing (suspected MSK in nature), thrombocytopenia followed by hematology.      Colonoscopy 11/2023: -non-bleeding internal hemorrhoids -one 5mm polyp in proximal transverse colon, removed with cold snare, hyperplastic -repeat colonoscopy in 10 years   Prior data:   CT chest without contrast 09/2022: IMPRESSION: 1. No pulmonary nodules requiring follow-up. 2. 1.5 cm low-attenuation right thyroid  nodule. Recommend thyroid  ultrasound. (Ref: J Am Coll Radiol. 2015 Feb;12(2): 143-50). 3. Hepatic steatosis.   CT A/P with contrast 08/2021: Unremarkable liver. Spleen normal.  Stable right renal cyst.   EGD April 2022 with normal-appearing esophagus but empiric dilation for history of dysphagia cannot be completed due to large amount of residual food in the stomach. She did not follow with recommendations for clear liquid diet after 6 PM the day prior.   EGD August 2022: Normal esophagus status post empiric dilation with mild resistance at 20 mm due to possible esophageal web, normal examined stomach and duodenum, small hiatal hernia.   Colonoscopy January 2020 with internal/external hemorrhoids, 3 mm cecal tubular adenoma removed. Next colonoscopy in 5 to10 years.   Medications   Current Outpatient Medications  Medication Sig Dispense Refill   atorvastatin  (LIPITOR ) 20 MG tablet Take 20 mg by mouth in the morning.     chlorthalidone (HYGROTON) 25 MG tablet Take 25 mg by mouth every morning.     Cholecalciferol (VITAMIN D3) 125 MCG (5000 UT) CAPS  Take 5,000 Units by mouth in the morning.     cycloSPORINE  (RESTASIS ) 0.05 % ophthalmic emulsion Place 1 drop into both eyes 2 (two) times daily as needed (dry eyes).     dexlansoprazole  (DEXILANT ) 60 MG capsule Take one capsule daily before breakfast. 90 capsule 3   Diclofenac  Sodium 1.6 % GEL Apply 2 g topically 4 (four) times daily as needed. Apply to left elbow and lower bicep region 4 times daily 50 g 0   famotidine  (PEPCID ) 20 MG tablet Take 1 tablet (20 mg total) by mouth daily before supper. 30 tablet 3   FARXIGA 10 MG TABS tablet Take 10 mg by mouth in the morning.     Ferrous Sulfate (IRON) 325 (65 Fe) MG TABS Take 65 mg by mouth daily.     Fluocinolone Acetonide 0.01 % OIL Place 1 application in ear(s) 2 (two) times daily as needed (ear itching).     GE100 BLOOD GLUCOSE TEST test strip CHECK BLOOD SUGAR ONCE DAILY OR AS DIRECTED BY PHYSICIAN     ipratropium (ATROVENT) 0.03 % nasal spray Place 2 sprays into both nostrils 3 (three) times daily as needed for rhinitis.     losartan (COZAAR) 100 MG tablet Take 100 mg by mouth in the morning.     metFORMIN  (GLUCOPHAGE -XR) 500 MG 24 hr tablet 1,000 mg 2 (two) times daily.     montelukast  (SINGULAIR ) 10 MG tablet TAKE 1 TABLET (10 MG TOTAL) BY MOUTH AT BEDTIME AS NEEDED (FOR ALLERGIES.). 90 tablet 1   nystatin cream (MYCOSTATIN) Apply 1 application topically 2 (two) times daily as needed (vaginal itching).     PARoxetine  (  PAXIL ) 40 MG tablet TAKE ONE TABLET BY MOUTH EVERY MORNING. (Patient taking differently: Take 40 mg by mouth in the morning.) 90 tablet 0   Semaglutide 14 MG TABS Take 14 mg by mouth daily.     verapamil  (CALAN -SR) 240 MG CR tablet TAKE ONE TABLET BY MOUTH EVERY EVENING. (Patient taking differently: Take 240 mg by mouth in the morning.) 90 tablet 0   vitamin B-12 (CYANOCOBALAMIN ) 500 MCG tablet Take 500 mcg by mouth in the morning.     No current facility-administered medications for this visit.    Allergies   Allergies  as of 02/27/2024 - Review Complete 11/26/2023  Allergen Reaction Noted   Prednisone Other (See Comments) 09/15/2016     Past Medical History   Past Medical History:  Diagnosis Date   Allergic rhinitis    Anxiety disorder    Arthritis    Gastroparesis    GERD (gastroesophageal reflux disease)    Headache    History of migraine    Hx of adenomatous colonic polyps    Hypertension    Iron deficiency anemia 09/22/2021   Thrombocytopenia (HCC)    Type 2 diabetes mellitus (HCC)    Not sure when she was diagnosed, greater than five years.     Past Surgical History   Past Surgical History:  Procedure Laterality Date   ABDOMINAL HYSTERECTOMY     has ovaries.    BALLOON DILATION N/A 05/17/2021   Procedure: BALLOON DILATION;  Surgeon: Vinetta Greening, DO;  [facility];  Service: Endoscopy;  Laterality: N/A;   BIOPSY  01/31/2021   Procedure: BIOPSY;  Surgeon: Vinetta Greening, DO;  [facility]: [facility];  Service: Endoscopy;;   CARDIAC CATHETERIZATION N/A 08/03/2016   Procedure: Left Heart Cath and Coronary Angiography;  Surgeon: Peter M Swaziland, MD;  Location: [facility];  Service: Cardiovascular;  Laterality: N/A;   COLONOSCOPY WITH PROPOFOL  N/A 11/05/2018   Dr. Nolene Baumgarten: int/ext hemorrhoids, 3mm cecal tubular adenoma removed, next TCS in 5-10 years   COLONOSCOPY WITH PROPOFOL  N/A 11/26/2023   Procedure: COLONOSCOPY WITH PROPOFOL ;  Surgeon: Vinetta Greening, DO;  [facility]: [facility];  Service: Endoscopy;  Laterality: N/A;  8:45 am, asa 3   ESOPHAGOGASTRODUODENOSCOPY (EGD) WITH PROPOFOL  N/A 11/05/2018   Dr. Nolene Baumgarten: esophagus was dilated, mild gastritis with benign bx   ESOPHAGOGASTRODUODENOSCOPY (EGD) WITH PROPOFOL  N/A 01/31/2021   Surgeon: Vinetta Greening, DO;  Normal esophagus, large amount of food residue in the stomach, gastritis biopsied (mild chronic gastritis, negative for H. pylori), normal examined duodenum s/p biopsy (benign).    ESOPHAGOGASTRODUODENOSCOPY (EGD) WITH PROPOFOL  N/A 05/17/2021   Surgeon: Vinetta Greening, DO; normal esophagus s/p empiric dilation with mild resistance at 20 mm due to possible esophageal web, normal examined stomach and duodenum, small hiatal hernia.   Miinor skin surgery     POLYPECTOMY  11/05/2018   Procedure: POLYPECTOMY;  Surgeon: Alyce Jubilee, MD;  [facility]: [facility];  Service: Endoscopy;;  cecal polyp   POLYPECTOMY  11/26/2023   Procedure: POLYPECTOMY;  Surgeon: Vinetta Greening, DO;  [facility];  Service: Endoscopy;;   SAVORY DILATION N/A 11/05/2018   Procedure: SAVORY DILATION;  Surgeon: Alyce Jubilee, MD;  [facility]: [facility];  Service: Endoscopy;  Laterality: N/A;    Past Family History   Family History  Problem Relation Age of Onset   Hypertension Mother    Diabetes Mother    Hypertension Son  Diabetes Son    Cancer Father    Cirrhosis Brother    Alcohol abuse Brother    Colon cancer Neg Hx     Past Social History   Social History   Socioeconomic History   Marital status: Single    Spouse name: Not on file   Number of children: 1   Years of education: Not on file   Highest education level: Not on file  Occupational History   Not on file  Tobacco Use   Smoking status: Never   Smokeless tobacco: Never  Vaping Use   Vaping status: Never Used  Substance and Sexual Activity   Alcohol use: No    Alcohol/week: 0.0 standard drinks of alcohol   Drug use: No   Sexual activity: Not Currently    Birth control/protection: Surgical  Other Topics Concern   Not on file  Social History Narrative   Reports is on disability. Has one son. Lives in [place] . Enjoys time with family. Enjoys Archivist. Attends church.    Social Drivers of Corporate investment banker Strain: Low Risk  (01/17/2021)   Overall Financial Resource Strain (CARDIA)    Difficulty of Paying Living Expenses: Not very hard  Food Insecurity: No Food Insecurity (01/17/2021)    Hunger Vital Sign    Worried About Running Out of Food in the Last Year: Never true    Ran Out of Food in the Last Year: Never true  Transportation Needs: No Transportation Needs (01/17/2021)   PRAPARE - Administrator, Civil Service (Medical): No    Lack of Transportation (Non-Medical): No  Physical Activity: Insufficiently Active (08/14/2023)   Received from [facility]   Exercise Vital Sign    Days of Exercise per Week: 4 days    Minutes of Exercise per Session: 20 min  Stress: No Stress Concern Present (08/11/2021)   Received from [facility] Occupational [facility] - Occupational Stress Questionnaire    Feeling of Stress : Only a little  Social Connections: Moderately Isolated (01/17/2021)   Social Connection and Isolation Panel [NHANES]    Frequency of Communication with Friends and Family: More than three times a week    Frequency of Social Gatherings with Friends and Family: More than three times a week    Attends Religious Services: More than 4 times per year    Active Member of [employer] or Organizations: No    Attends Engineer, structural: Not on file    Marital Status: Never married  Intimate Partner Violence: Not At Risk (08/14/2023)   Received from [facility]   Humiliation, Afraid, Rape, and Kick questionnaire    Fear of Current or Ex-Partner: No    Emotionally Abused: No    Physically Abused: No    Sexually Abused: No    Review of Systems   General: Negative for anorexia, weight loss, fever, chills, fatigue, weakness. ENT: Negative for hoarseness, difficulty swallowing , nasal congestion. CV: Negative for chest pain, angina, palpitations, dyspnea on exertion, peripheral edema.  Respiratory: Negative for dyspnea at rest, dyspnea on exertion, cough, sputum, wheezing.  GI: See history of present illness. GU:  Negative for dysuria, hematuria, urinary incontinence, urinary frequency, nocturnal urination.   Endo: Negative for unusual weight change.     Physical Exam   There were no vitals taken for this visit.   General: Well-nourished, well-developed in no acute distress.  Eyes: No icterus. Mouth: Oropharyngeal  mucosa moist and pink , no lesions erythema or exudate. Lungs: Clear to auscultation bilaterally.  Heart: Regular rate and rhythm, no murmurs rubs or gallops.  Abdomen: Bowel sounds are normal, nontender, nondistended, no hepatosplenomegaly or masses,  no abdominal bruits or hernia , no rebound or guarding.  Rectal: ***  Extremities: No lower extremity edema. No clubbing or deformities. Neuro: Alert and oriented x 4   Skin: Warm and dry, no jaundice.   Psych: Alert and cooperative, normal mood and affect.  Labs   Lab Results  Component Value Date   NA 138 11/21/2023   CL 101 11/21/2023   K 3.7 11/21/2023   CO2 24 11/21/2023   BUN 30 (H) 11/21/2023   CREATININE 1.25 (H) 11/21/2023   GFRNONAA 48 (L) 11/21/2023   CALCIUM  10.4 (H) 11/21/2023   ALBUMIN 4.4 02/12/2023   GLUCOSE 141 (H) 11/21/2023   Lab Results  Component Value Date   ALT 26 02/12/2023   AST 41 (H) 02/12/2023   ALKPHOS 104 02/12/2023   BILITOT <0.2 02/12/2023   Lab Results  Component Value Date   WBC 5.6 08/22/2023   HGB 14.0 08/22/2023   HCT 44.1 08/22/2023   MCV 90.0 08/22/2023   PLT 91 (L) 08/22/2023   Lab Results  Component Value Date   IRON 85 08/22/2023   TIBC 405 08/22/2023   FERRITIN 113 08/22/2023   Lab Results  Component Value Date   VITAMINB12 708 02/22/2023   Lab Results  Component Value Date   FOLATE 13.0 05/12/2021    Labs from [facility] dated October 2024: Creatinine 1.25, EGFR 48, albumin 4.7,    Imaging Studies   No results found.  Assessment       PLAN   ***GERD/gastroparesis: -Seems to have flared since increasing oral daily semaglutide -Encouraged multiple small meals/snacks throughout the day, avoiding fatty foods/raw veggies/spicy foods. -Add  famotidine  20 mg at supper -Continue Dexilant  60 mg before breakfast -Call with persistent symptoms   History of adenomatous colon polyps: Due for surveillance at this time -ASA 3, ok for room 1/2.  I have discussed the risks, alternatives, benefits with regards to but not limited to the risk of reaction to medication, bleeding, infection, perforation and the patient is agreeable to proceed. Written consent to be obtained.   Thrombocytopenia: Followed by hematology.  History of fatty liver on prior imaging, CT chest.  No splenomegaly in 2022.  No evidence of portal hypertension on EGD in 2022.  She has had mildly elevated AST which is somewhat nonspecific.  See below for fatty liver instructions   Hepatic steatosis: Noted on prior imaging of the CT chest.  Chronic thrombocytopenia followed by hematology. -We will have her follow-up after her colonoscopy, update liver labs at that time, consider right upper quadrant ultrasound with elastography baseline   Generalized abdominal pain: Somewhat nonspecific.  Await colonoscopy findings.  Could consider updating CT imaging if needed.         Trudie Fuse. Harles Lied, MHS, PA-C [facility]

## 2024-02-27 ENCOUNTER — Telehealth: Payer: Self-pay | Admitting: *Deleted

## 2024-02-27 ENCOUNTER — Encounter: Payer: Self-pay | Admitting: Gastroenterology

## 2024-02-27 ENCOUNTER — Ambulatory Visit (INDEPENDENT_AMBULATORY_CARE_PROVIDER_SITE_OTHER): Admitting: Gastroenterology

## 2024-02-27 VITALS — BP 107/65 | HR 72 | Temp 97.7°F | Ht 67.0 in | Wt 178.0 lb

## 2024-02-27 DIAGNOSIS — D696 Thrombocytopenia, unspecified: Secondary | ICD-10-CM | POA: Diagnosis not present

## 2024-02-27 DIAGNOSIS — R1013 Epigastric pain: Secondary | ICD-10-CM | POA: Diagnosis not present

## 2024-02-27 DIAGNOSIS — R1032 Left lower quadrant pain: Secondary | ICD-10-CM | POA: Diagnosis not present

## 2024-02-27 DIAGNOSIS — K219 Gastro-esophageal reflux disease without esophagitis: Secondary | ICD-10-CM

## 2024-02-27 DIAGNOSIS — K760 Fatty (change of) liver, not elsewhere classified: Secondary | ICD-10-CM | POA: Diagnosis not present

## 2024-02-27 DIAGNOSIS — K3184 Gastroparesis: Secondary | ICD-10-CM

## 2024-02-27 NOTE — Patient Instructions (Signed)
 Please take my lab orders when you go for labs on Monday.  We will schedule you for a CT scan of your abdomen in the near future.

## 2024-02-27 NOTE — Telephone Encounter (Signed)
 LMTCB to give CT appt details

## 2024-02-28 NOTE — Telephone Encounter (Signed)
 Spoke with pt and she is aware of CT appt details

## 2024-03-03 ENCOUNTER — Inpatient Hospital Stay: Attending: Hematology

## 2024-03-03 DIAGNOSIS — E559 Vitamin D deficiency, unspecified: Secondary | ICD-10-CM | POA: Diagnosis not present

## 2024-03-03 DIAGNOSIS — Z79899 Other long term (current) drug therapy: Secondary | ICD-10-CM | POA: Diagnosis not present

## 2024-03-03 DIAGNOSIS — E611 Iron deficiency: Secondary | ICD-10-CM | POA: Diagnosis not present

## 2024-03-03 DIAGNOSIS — D696 Thrombocytopenia, unspecified: Secondary | ICD-10-CM | POA: Insufficient documentation

## 2024-03-03 DIAGNOSIS — D509 Iron deficiency anemia, unspecified: Secondary | ICD-10-CM

## 2024-03-03 DIAGNOSIS — E538 Deficiency of other specified B group vitamins: Secondary | ICD-10-CM

## 2024-03-03 LAB — CBC WITH DIFFERENTIAL/PLATELET
Abs Immature Granulocytes: 0.03 10*3/uL (ref 0.00–0.07)
Basophils Absolute: 0.1 10*3/uL (ref 0.0–0.1)
Basophils Relative: 2 %
Eosinophils Absolute: 0.3 10*3/uL (ref 0.0–0.5)
Eosinophils Relative: 6 %
HCT: 43 % (ref 36.0–46.0)
Hemoglobin: 14 g/dL (ref 12.0–15.0)
Immature Granulocytes: 1 %
Lymphocytes Relative: 35 %
Lymphs Abs: 1.9 10*3/uL (ref 0.7–4.0)
MCH: 29.7 pg (ref 26.0–34.0)
MCHC: 32.6 g/dL (ref 30.0–36.0)
MCV: 91.1 fL (ref 80.0–100.0)
Monocytes Absolute: 0.4 10*3/uL (ref 0.1–1.0)
Monocytes Relative: 8 %
Neutro Abs: 2.7 10*3/uL (ref 1.7–7.7)
Neutrophils Relative %: 48 %
Platelets: 99 10*3/uL — ABNORMAL LOW (ref 150–400)
RBC: 4.72 MIL/uL (ref 3.87–5.11)
RDW: 13 % (ref 11.5–15.5)
WBC: 5.4 10*3/uL (ref 4.0–10.5)
nRBC: 0 % (ref 0.0–0.2)

## 2024-03-03 LAB — IRON AND TIBC
Iron: 77 ug/dL (ref 28–170)
Saturation Ratios: 19 % (ref 10.4–31.8)
TIBC: 412 ug/dL (ref 250–450)
UIBC: 335 ug/dL

## 2024-03-03 LAB — VITAMIN B12: Vitamin B-12: 702 pg/mL (ref 180–914)

## 2024-03-03 LAB — FERRITIN: Ferritin: 133 ng/mL (ref 11–307)

## 2024-03-03 NOTE — Progress Notes (Deleted)
 [facility] [address], [place] [address]   CLINIC:  Medical Oncology/Hematology  PCP:  Dorena Gander, MD [phone] [address] [place] [address] [phone]    REASON FOR VISIT:  Follow-up for thrombocytopenia and iron deficiency   PRIOR THERAPY: Oral iron   CURRENT THERAPY: Intermittent IV iron  INTERVAL HISTORY:   Barbara House 68 y.o. female returns for routine follow-up of thrombocytopenia and iron deficiency.  She was last seen by Sheril Dines PA-C on 08/29/2023.  At today's visit, she reports feeling ***.  No recent hospitalizations, surgeries, or changes in baseline health status.    ***She has not noticed any bright red blood per rectum or melena.   ***She does note occasional scant epistaxis when she blows her nose.   ***She denies any fatigue, pica, restless legs, headaches, chest pain, dyspnea on exertion, lightheadedness, or syncope.    ***She reports easy bruising, but denies any petechial rash.    ***No B symptoms such as fever, chills, night sweats. *** She continues to take B12 and iron panel daily, plus vitamin B12 5000 units daily.*** ***She has 100***% energy and 100***% appetite. She endorses that she is maintaining a stable weight.***  ASSESSMENT & PLAN:  1.  Thrombocytopenia, mild to moderate - Initial work-up unable to determine cause of thrombocytopenia - SPEP was normal, hepatitis panel was normal, RF and ANA were negative, no overt nutritional deficiencies (normal B12, folate, methylmalonic acid, copper ).  Previous testing of hepatitis C, HIV, and H. pylori were negative. - Pathologist smear review on 08/29/2019 showed mild thrombocytopenia, borderline anemia - Normal abdominal ultrasound from 08/15/2017 negative for liver abnormality, spleen was normal in size and appearance - CT abdomen/pelvis (08/17/2021): Normal liver and spleen - Reported having one blood transfusion many years ago, but unsure of  reason - Patient is taking vitamin B12 pills*** - Platelets have ranged from 80-109 since January 2020 - No major bleeding events, no B symptoms  *** - No abnormal bruising or petechial rash  *** - Most recent labs (***): Platelets ***, at baseline.  Otherwise normal CBC.  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  *** No intervention needed at this time, we will continue to monitor. - RTC in 6 months for repeat labs and follow-up.   - Will consider trial of steroids and/or bone marrow biopsy if any significant changes in CBC.   2.  Normocytic anemia (resolved) with iron deficiency state - Patient has had prior history of mild normocytic anemia with hemoglobin ranging from 11-12, possibly secondary to CKD and functional iron deficiency - EGD (05/17/2021): No evidence of bleeding, erosions, ulcerations, or varices - Colonoscopy (11/05/2018): Polyp x1, external and internal hemorrhoids - Currently taking daily B12 tablet daily and iron tablet daily.*** - Received IV Feraheme  x2 in December 2022 - Energy improved after IV iron - No bright red blood per rectum or melena*** - Most recent labs (***): Normal Hgb ***/MCV ***, ferritin ***, iron saturation ***% with normal TIBC.  B12 ***/MMA ***.- PLAN: *** TBD.  *** No indication for IV iron at this time. - RTC in 6 months for follow-up visit and repeat CBC and iron panel.  (Check B12/MMA annually, next due May 2026)   3.  Vitamin D  deficiency - Hypovitaminosis D evidenced in August 2021 with vitamin D  level 23.76  - Currently taking Vitamin D3 cholecalciferol 5,000  units daily*** - Most recent vitamin D  (***) is normal at *** - PLAN: Continue vitamin D  supplementation.  Check vitamin D  in 1 year (~May 2026)   PLAN SUMMARY: *** TBD *** >> Labs in 6 months = CBC/D, ferritin, iron/TIBC, B12, MMA, vitamin D  >> OFFICE visit in 6 months  (1 week after labs)     REVIEW OF SYSTEMS: ***  Review of Systems  Constitutional:  Negative for appetite change, chills, diaphoresis, fatigue, fever and unexpected weight change.  HENT:   Negative for lump/mass and nosebleeds.   Eyes:  Negative for eye problems.  Respiratory:  Positive for cough (sinus drainage). Negative for hemoptysis and shortness of breath.   Cardiovascular:  Positive for chest pain (intermittently, none today). Negative for leg swelling and palpitations.  Gastrointestinal:  Positive for diarrhea. Negative for abdominal pain, blood in stool, constipation, nausea and vomiting.  Genitourinary:  Negative for hematuria.   Skin:  Positive for itching and rash.  Neurological:  Positive for headaches and light-headedness. Negative for dizziness and numbness.  Hematological:  Does not bruise/bleed easily.     PHYSICAL EXAM:  ECOG PERFORMANCE STATUS: 1 - Symptomatic but completely ambulatory *** There were no vitals filed for this visit.  There were no vitals filed for this visit.  Physical Exam Constitutional:      Appearance: Normal appearance. She is obese.  Cardiovascular:     Heart sounds: Normal heart sounds.  Pulmonary:     Breath sounds: Normal breath sounds.  Musculoskeletal:     Right lower leg: Edema present.     Left lower leg: Edema present.  Neurological:     General: No focal deficit present.     Mental Status: Mental status is at baseline.  Psychiatric:        Behavior: Behavior normal. Behavior is cooperative.     PAST MEDICAL/SURGICAL HISTORY:  Past Medical History:  Diagnosis Date   Allergic rhinitis    Anxiety disorder    Arthritis    Gastroparesis    GERD (gastroesophageal reflux disease)    Headache    History of migraine    Hx of adenomatous colonic polyps    Hypertension    Iron deficiency anemia 09/22/2021   Thrombocytopenia (HCC)    Type 2 diabetes mellitus (HCC)    Not sure when she was diagnosed, greater than five years.     Past Surgical History:  Procedure Laterality Date   ABDOMINAL HYSTERECTOMY     has ovaries.    BALLOON DILATION N/A 05/17/2021   Procedure: BALLOON DILATION;  Surgeon: Vinetta Greening, DO;  [facility];  Service: Endoscopy;  Laterality: N/A;   BIOPSY  01/31/2021   Procedure: BIOPSY;  Surgeon: Vinetta Greening, DO;  [facility]: [facility];  Service: Endoscopy;;   CARDIAC CATHETERIZATION N/A 08/03/2016   Procedure: Left Heart Cath and Coronary Angiography;  Surgeon: Peter M Swaziland, MD;  Location: [facility];  Service: Cardiovascular;  Laterality: N/A;   COLONOSCOPY WITH PROPOFOL  N/A 11/05/2018   Dr. Nolene Baumgarten: int/ext hemorrhoids, 3mm cecal tubular adenoma removed, next TCS in 5-10 years   COLONOSCOPY WITH PROPOFOL  N/A 11/26/2023   Procedure: COLONOSCOPY WITH PROPOFOL ;  Surgeon: Vinetta Greening, DO;  Location: [facility];  Service: Endoscopy;  Laterality: N/A;  8:45 am, asa 3   ESOPHAGOGASTRODUODENOSCOPY (EGD) WITH PROPOFOL  N/A 11/05/2018   Dr. Nolene Baumgarten: esophagus was dilated, mild gastritis with benign bx   ESOPHAGOGASTRODUODENOSCOPY (EGD) WITH PROPOFOL  N/A 01/31/2021  Surgeon: Vinetta Greening, DO;  Normal esophagus, large amount of food residue in the stomach, gastritis biopsied (mild chronic gastritis, negative for H. pylori), normal examined duodenum s/p biopsy (benign).   ESOPHAGOGASTRODUODENOSCOPY (EGD) WITH PROPOFOL  N/A 05/17/2021   Surgeon: Vinetta Greening, DO; normal esophagus s/p empiric dilation with mild resistance at 20 mm due to possible esophageal web, normal examined stomach and duodenum, small hiatal hernia.   Miinor skin surgery     POLYPECTOMY  11/05/2018   Procedure: POLYPECTOMY;  Surgeon: Alyce Jubilee, MD;  [facility];  Service: Endoscopy;;  cecal polyp   POLYPECTOMY  11/26/2023   Procedure: POLYPECTOMY;  Surgeon: Vinetta Greening, DO;  [facility];  Service: Endoscopy;;   SAVORY DILATION N/A 11/05/2018    Procedure: SAVORY DILATION;  Surgeon: Alyce Jubilee, MD;  [facility]: [facility];  Service: Endoscopy;  Laterality: N/A;    SOCIAL HISTORY:  Social History   Socioeconomic History   Marital status: Single    Spouse name: Not on file   Number of children: 1   Years of education: Not on file   Highest education level: Not on file  Occupational History   Not on file  Tobacco Use   Smoking status: Never   Smokeless tobacco: Never  Vaping Use   Vaping status: Never Used  Substance and Sexual Activity   Alcohol use: No    Alcohol/week: 0.0 standard drinks of alcohol   Drug use: No   Sexual activity: Not Currently    Birth control/protection: Surgical  Other Topics Concern   Not on file  Social History Narrative   Reports is on disability. Has one son. Lives in [place] . Enjoys time with family. Enjoys Archivist. Attends church.    Social Drivers of Corporate investment banker Strain: Low Risk  (01/17/2021)   Overall Financial Resource Strain (CARDIA)    Difficulty of Paying Living Expenses: Not very hard  Food Insecurity: No Food Insecurity (01/17/2021)   Hunger Vital Sign    Worried About Running Out of Food in the Last Year: Never true    Ran Out of Food in the Last Year: Never true  Transportation Needs: No Transportation Needs (01/17/2021)   PRAPARE - Administrator, Civil Service (Medical): No    Lack of Transportation (Non-Medical): No  Physical Activity: Insufficiently Active (08/14/2023)   [facility] from [facility]   Exercise Vital Sign    Days of Exercise per Week: 4 days    Minutes of Exercise per Session: 20 min  Stress: No Stress Concern Present (08/11/2021)   Received from [facility] Occupational [facility] - Occupational Stress Questionnaire    Feeling of Stress : Only a little  Social Connections: Moderately Isolated (01/17/2021)   Social Connection and Isolation Panel [NHANES]    Frequency of  Communication with Friends and Family: More than three times a week    Frequency of Social Gatherings with Friends and Family: More than three times a week    Attends Religious Services: More than 4 times per year    Active Member of [employer] or Organizations: No    Attends Banker Meetings: Not on file    Marital Status: Never married  Intimate Partner Violence: Not At Risk (08/14/2023)   Received from [facility]   Humiliation, Afraid, Rape, and Kick questionnaire    Fear of Current or Ex-Partner: No  Emotionally Abused: No    Physically Abused: No    Sexually Abused: No    FAMILY HISTORY:  Family History  Problem Relation Age of Onset   Hypertension Mother    Diabetes Mother    Hypertension Son    Diabetes Son    Cancer Father    Cirrhosis Brother    Alcohol abuse Brother    Colon cancer Neg Hx     CURRENT MEDICATIONS:  Outpatient Encounter Medications as of 03/04/2024  Medication Sig   atorvastatin  (LIPITOR ) 20 MG tablet Take 20 mg by mouth in the morning.   chlorthalidone (HYGROTON) 25 MG tablet Take 25 mg by mouth every morning.   Cholecalciferol (VITAMIN D3) 125 MCG (5000 UT) CAPS Take 5,000 Units by mouth in the morning.   cycloSPORINE  (RESTASIS ) 0.05 % ophthalmic emulsion Place 1 drop into both eyes 2 (two) times daily as needed (dry eyes).   dexlansoprazole  (DEXILANT ) 60 MG capsule Take one capsule daily before breakfast.   Diclofenac  Sodium 1.6 % GEL Apply 2 g topically 4 (four) times daily as needed. Apply to left elbow and lower bicep region 4 times daily   famotidine  (PEPCID ) 20 MG tablet Take 1 tablet (20 mg total) by mouth daily before supper.   FARXIGA 10 MG TABS tablet Take 10 mg by mouth in the morning.   Ferrous Sulfate (IRON) 325 (65 Fe) MG TABS Take 65 mg by mouth daily.   Fluocinolone Acetonide 0.01 % OIL Place 1 application in ear(s) 2 (two) times daily as needed (ear itching).   GE100 BLOOD GLUCOSE TEST test strip CHECK BLOOD SUGAR ONCE  DAILY OR AS DIRECTED BY PHYSICIAN   ipratropium (ATROVENT) 0.03 % nasal spray Place 2 sprays into both nostrils 3 (three) times daily as needed for rhinitis.   losartan (COZAAR) 100 MG tablet Take 100 mg by mouth in the morning.   meclizine  (ANTIVERT ) 25 MG tablet Take 12.5 mg by mouth 2 (two) times daily as needed.   metFORMIN  (GLUCOPHAGE -XR) 500 MG 24 hr tablet 1,000 mg 2 (two) times daily.   montelukast  (SINGULAIR ) 10 MG tablet TAKE 1 TABLET (10 MG TOTAL) BY MOUTH AT BEDTIME AS NEEDED (FOR ALLERGIES.).   PARoxetine  (PAXIL ) 40 MG tablet TAKE ONE TABLET BY MOUTH EVERY MORNING. (Patient taking differently: Take 40 mg by mouth in the morning.)   Semaglutide 14 MG TABS Take 14 mg by mouth daily.   verapamil  (CALAN -SR) 240 MG CR tablet TAKE ONE TABLET BY MOUTH EVERY EVENING. (Patient taking differently: Take 240 mg by mouth in the morning.)   vitamin B-12 (CYANOCOBALAMIN ) 500 MCG tablet Take 500 mcg by mouth in the morning.   No facility-administered encounter medications on file as of 03/04/2024.    ALLERGIES:  Allergies  Allergen Reactions   Prednisone Other (See Comments)    Hair loss    LABORATORY DATA:  I have reviewed the labs as listed.  CBC    Component Value Date/Time   WBC 5.6 08/22/2023 1001   RBC 4.90 08/22/2023 1001   HGB 14.0 08/22/2023 1001   HGB 13.6 02/12/2023 0843   HCT 44.1 08/22/2023 1001   HCT 41.3 02/12/2023 0843   PLT 91 (L) 08/22/2023 1001   PLT 79 (LL) 02/12/2023 0843   MCV 90.0 08/22/2023 1001   MCV 88 02/12/2023 0843   MCH 28.6 08/22/2023 1001   MCHC 31.7 08/22/2023 1001   RDW 13.1 08/22/2023 1001   RDW 12.2 02/12/2023 0843   LYMPHSABS 1.6 08/22/2023 1001  LYMPHSABS 1.3 02/12/2023 0843   MONOABS 0.5 08/22/2023 1001   EOSABS 0.4 08/22/2023 1001   EOSABS 0.4 02/12/2023 0843   BASOSABS 0.1 08/22/2023 1001   BASOSABS 0.1 02/12/2023 0843      Latest Ref Rng & Units 11/21/2023    9:06 AM 02/12/2023    8:43 AM 08/17/2021   10:19 AM  CMP  Glucose 70 -  99 mg/dL 161  92  69   BUN 8 - 23 mg/dL 30  21  41   Creatinine 0.44 - 1.00 mg/dL 0.96  0.45  4.09   Sodium 135 - 145 mmol/L 138  143  139   Potassium 3.5 - 5.1 mmol/L 3.7  4.2  4.4   Chloride 98 - 111 mmol/L 101  104  106   CO2 22 - 32 mmol/L 24  24  24    Calcium  8.9 - 10.3 mg/dL 81.1  91.4  78.2   Total Protein 6.0 - 8.5 g/dL  7.3  7.6   Total Bilirubin 0.0 - 1.2 mg/dL  <9.5  0.3   Alkaline Phos 44 - 121 IU/L  104  66   AST 0 - 40 IU/L  41  23   ALT 0 - 32 IU/L  26  16     DIAGNOSTIC IMAGING:  I have independently reviewed the relevant imaging and discussed with the patient.   WRAP UP:  All questions were answered. The patient knows to call the clinic with any problems, questions or concerns.  Medical decision making: Moderate***  Time spent on visit: I spent 20 minutes counseling the patient face to face. The total time spent in the appointment was 30 minutes and more than 50% was on counseling.  Sonnie Dusky, PA-C  ***

## 2024-03-04 ENCOUNTER — Inpatient Hospital Stay: Payer: Medicare Other | Admitting: Physician Assistant

## 2024-03-04 LAB — MISC LABCORP TEST (SEND OUT): Labcorp test code: 81950

## 2024-03-05 ENCOUNTER — Other Ambulatory Visit: Payer: Self-pay | Admitting: Family Medicine

## 2024-03-05 DIAGNOSIS — Z1231 Encounter for screening mammogram for malignant neoplasm of breast: Secondary | ICD-10-CM

## 2024-03-06 LAB — METHYLMALONIC ACID, SERUM: Methylmalonic Acid, Quantitative: 162 nmol/L (ref 0–378)

## 2024-03-11 NOTE — Progress Notes (Unsigned)
 [facility] [address], [place] [address]   CLINIC:  Medical Oncology/Hematology  PCP:  Dorena Gander, MD [phone] [address] [place] [address] [phone]   REASON FOR VISIT:  Follow-up for thrombocytopenia and iron deficiency   PRIOR THERAPY: Oral iron   CURRENT THERAPY: Intermittent IV iron   INTERVAL HISTORY:   Barbara House 68 y.o. female returns for routine follow-up of thrombocytopenia and iron deficiency.  She was last seen by Sheril Dines PA-C on 08/29/2023.   At today's visit, she reports feeling fairly well.  No recent hospitalizations, surgeries, or changes in baseline health status.    She has not noticed any bright red blood per rectum or melena.   She does note occasional scant epistaxis when she blows her nose, but only when her "sinuses are real bad."   She has occasional headaches related to her sinus issues. She denies any fatigue, ice pica, chest pain, dyspnea on exertion, lightheadedness, or syncope.    She reports easy bruising, but denies any petechial rash.    No B symptoms such as fever, chills, night sweats. She continues to take B12 and iron tablet daily, plus vitamin B12 5000 units daily. She has 100% energy and 100% appetite. She endorses that she is maintaining a stable weight.  ASSESSMENT & PLAN:  1.  Thrombocytopenia, mild to moderate - Initial work-up unable to determine cause of thrombocytopenia - SPEP was normal, hepatitis panel was normal, RF and ANA were negative, no overt nutritional deficiencies (normal B12, folate, methylmalonic acid, copper ).  Previous testing of hepatitis C, HIV, and H. pylori were negative. - Pathologist smear review on 08/29/2019 showed mild thrombocytopenia, borderline anemia - Normal abdominal ultrasound from 08/15/2017 negative for liver abnormality, spleen was normal in size and appearance - CT abdomen/pelvis (08/17/2021): Normal liver and spleen - Reported having one  blood transfusion many years ago, but unsure of reason - Patient is taking vitamin B12 pills - Platelets have ranged from 80-109 since January 2020 - No major bleeding events, no B symptoms   - No abnormal bruising or petechial rash   - Most recent labs (03/03/2024): Platelets 99, at baseline.  Otherwise normal CBC.  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 - RTC in 6 months for repeat labs and follow-up.   - Will consider trial of steroids and/or bone marrow biopsy if any significant changes in CBC.   2.  Normocytic anemia (resolved) with iron deficiency state - Patient has had prior history of mild normocytic anemia with hemoglobin ranging from 11-12, possibly secondary to CKD and functional iron deficiency - EGD (05/17/2021): No evidence of bleeding, erosions, ulcerations, or varices - Colonoscopy (11/26/2023): Polyp x1 (hyperplastic polyp), nonbleeding internal hemorrhoids - Currently taking daily B12 tablet daily and iron tablet daily. - Received IV Feraheme  x2 in December 2022 - Energy improved after IV iron - No bright red blood per rectum or melena - Most recent labs (03/03/2024): Normal Hgb 14.0/MCV 91.1, ferritin 133, iron saturation 19% with normal TIBC.  B12 702/MMA 162. - PLAN:  No indication for IV iron at this time. - RTC in 6 months for follow-up visit and repeat CBC and iron panel.  (Check B12/MMA annually, next due May 2026)   3.  Vitamin D  deficiency - Hypovitaminosis D evidenced in August 2021 with vitamin  D level 23.76  - Currently taking Vitamin D3 cholecalciferol 5,000 units daily - Most recent vitamin D  (03/03/2024) is normal at 47.6 - PLAN: Continue vitamin D  supplementation.  Check vitamin D  in 1 year (~May 2026)  PLAN SUMMARY:  >> Labs in 6 months = CBC/D, ferritin, iron/TIBC >> OFFICE visit in  6 months (1 week after labs)     REVIEW OF SYSTEMS:   Review of Systems  Constitutional:  Negative for appetite change, chills, diaphoresis, fatigue, fever and unexpected weight change.  HENT:   Negative for lump/mass and nosebleeds.   Eyes:  Negative for eye problems.  Respiratory:  Negative for cough, hemoptysis and shortness of breath.   Cardiovascular:  Negative for chest pain, leg swelling and palpitations.  Gastrointestinal:  Negative for abdominal pain, blood in stool, constipation, diarrhea, nausea and vomiting.  Genitourinary:  Negative for hematuria.   Skin: Negative.   Neurological:  Positive for dizziness and headaches. Negative for light-headedness.  Hematological:  Does not bruise/bleed easily.     PHYSICAL EXAM:  ECOG PERFORMANCE STATUS: 0 - Asymptomatic  Vitals:   03/12/24 1522  BP: 126/84  Pulse: 81  Resp: 18  Temp: 97.9 F (36.6 C)  SpO2: 98%   Filed Weights   03/12/24 1522  Weight: 177 lb (80.3 kg)   Physical Exam Constitutional:      Appearance: Normal appearance. She is obese.  Cardiovascular:     Heart sounds: Normal heart sounds.  Pulmonary:     Breath sounds: Normal breath sounds.  Neurological:     General: No focal deficit present.     Mental Status: Mental status is at baseline.  Psychiatric:        Behavior: Behavior normal. Behavior is cooperative.     PAST MEDICAL/SURGICAL HISTORY:  Past Medical History:  Diagnosis Date   Allergic rhinitis    Anxiety disorder    Arthritis    Gastroparesis    GERD (gastroesophageal reflux disease)    Headache    History of migraine    Hx of adenomatous colonic polyps    Hypertension    Iron deficiency anemia 09/22/2021   Thrombocytopenia (HCC)    Type 2 diabetes mellitus (HCC)    Not sure when she was diagnosed, greater than five years.    Past Surgical History:  Procedure Laterality Date   ABDOMINAL HYSTERECTOMY     has ovaries.    BALLOON DILATION N/A 05/17/2021   Procedure: BALLOON  DILATION;  Surgeon: Vinetta Greening, DO;  [facility];  Service: Endoscopy;  Laterality: N/A;   BIOPSY  01/31/2021   Procedure: BIOPSY;  Surgeon: Vinetta Greening, DO;  [facility]: [facility];  Service: Endoscopy;;   CARDIAC CATHETERIZATION N/A 08/03/2016   Procedure: Left Heart Cath and Coronary Angiography;  Surgeon: Peter M Swaziland, MD;  Location: [facility] INVASIVE CV LAB;  Service: Cardiovascular;  Laterality: N/A;   COLONOSCOPY WITH PROPOFOL  N/A 11/05/2018   Dr. Nolene Baumgarten: int/ext hemorrhoids, 3mm cecal tubular adenoma removed, next TCS in 5-10 years   COLONOSCOPY WITH PROPOFOL  N/A 11/26/2023   Procedure: COLONOSCOPY WITH PROPOFOL ;  Surgeon: Vinetta Greening, DO;  Location: [facility];  Service: Endoscopy;  Laterality: N/A;  8:45 am, asa 3   ESOPHAGOGASTRODUODENOSCOPY (EGD) WITH PROPOFOL  N/A 11/05/2018   Dr. Nolene Baumgarten: esophagus was dilated, mild gastritis with benign bx   ESOPHAGOGASTRODUODENOSCOPY (EGD) WITH PROPOFOL  N/A 01/31/2021   Surgeon: Vinetta Greening, DO;  Normal esophagus, large amount of food residue in the  stomach, gastritis biopsied (mild chronic gastritis, negative for H. pylori), normal examined duodenum s/p biopsy (benign).   ESOPHAGOGASTRODUODENOSCOPY (EGD) WITH PROPOFOL  N/A 05/17/2021   Surgeon: Vinetta Greening, DO; normal esophagus s/p empiric dilation with mild resistance at 20 mm due to possible esophageal web, normal examined stomach and duodenum, small hiatal hernia.   Miinor skin surgery     POLYPECTOMY  11/05/2018   Procedure: POLYPECTOMY;  Surgeon: Alyce Jubilee, MD;  [facility]: [facility];  Service: Endoscopy;;  cecal polyp   POLYPECTOMY  11/26/2023   Procedure: POLYPECTOMY;  Surgeon: Vinetta Greening, DO;  [facility]: [facility];  Service: Endoscopy;;   SAVORY DILATION N/A 11/05/2018   Procedure: SAVORY DILATION;  Surgeon: Alyce Jubilee, MD;  [facility]: [facility];  Service: Endoscopy;  Laterality: N/A;    SOCIAL HISTORY:  Social History    Socioeconomic History   Marital status: Single    Spouse name: Not on file   Number of children: 1   Years of education: Not on file   Highest education level: Not on file  Occupational History   Not on file  Tobacco Use   Smoking status: Never   Smokeless tobacco: Never  Vaping Use   Vaping status: Never Used  Substance and Sexual Activity   Alcohol use: No    Alcohol/week: 0.0 standard drinks of alcohol   Drug use: No   Sexual activity: Not Currently    Birth control/protection: Surgical  Other Topics Concern   Not on file  Social History Narrative   Reports is on disability. Has one son. Lives in [place] . Enjoys time with family. Enjoys Archivist. Attends church.    Social Drivers of Corporate investment banker Strain: Low Risk  (01/17/2021)   Overall Financial Resource Strain (CARDIA)    Difficulty of Paying Living Expenses: Not very hard  Food Insecurity: No Food Insecurity (01/17/2021)   Hunger Vital Sign    Worried About Running Out of Food in the Last Year: Never true    Ran Out of Food in the Last Year: Never true  Transportation Needs: No Transportation Needs (01/17/2021)   PRAPARE - Administrator, Civil Service (Medical): No    Lack of Transportation (Non-Medical): No  Physical Activity: Insufficiently Active (08/14/2023)   Received from [facility]   Exercise Vital Sign    Days of Exercise per Week: 4 days    Minutes of Exercise per Session: 20 min  Stress: No Stress Concern Present (08/11/2021)   Received from [facility] - Occupational Stress Questionnaire    Feeling of Stress : Only a little  Social Connections: Moderately Isolated (01/17/2021)   Social Connection and Isolation Panel [NHANES]    Frequency of Communication with Friends and Family: More than three times a week    Frequency of Social Gatherings with Friends and Family: More than three times a week    Attends  Religious Services: More than 4 times per year    Active Member of [employer] or Organizations: No    Attends Engineer, structural: Not on file    Marital Status: Never married  Intimate Partner Violence: Not At Risk (08/14/2023)   Received from [facility]   Humiliation, Afraid, Rape, and Kick questionnaire    Fear of Current or Ex-Partner: No    Emotionally Abused: No    Physically Abused: No    Sexually  Abused: No    FAMILY HISTORY:  Family History  Problem Relation Age of Onset   Hypertension Mother    Diabetes Mother    Hypertension Son    Diabetes Son    Cancer Father    Cirrhosis Brother    Alcohol abuse Brother    Colon cancer Neg Hx     CURRENT MEDICATIONS:  Outpatient Encounter Medications as of 03/12/2024  Medication Sig   acetaminophen  (TYLENOL ) 500 MG tablet 1 tablet as needed Orally every 6 hrs   atorvastatin  (LIPITOR ) 20 MG tablet Take 20 mg by mouth in the morning.   chlorthalidone (HYGROTON) 25 MG tablet Take 25 mg by mouth every morning.   Cholecalciferol (VITAMIN D3) 125 MCG (5000 UT) CAPS Take 5,000 Units by mouth in the morning.   cycloSPORINE  (RESTASIS ) 0.05 % ophthalmic emulsion Place 1 drop into both eyes 2 (two) times daily as needed (dry eyes).   dexlansoprazole  (DEXILANT ) 60 MG capsule Take one capsule daily before breakfast.   Diclofenac  Sodium 1.6 % GEL Apply 2 g topically 4 (four) times daily as needed. Apply to left elbow and lower bicep region 4 times daily   famotidine  (PEPCID ) 20 MG tablet Take 1 tablet (20 mg total) by mouth daily before supper.   FARXIGA 10 MG TABS tablet Take 10 mg by mouth in the morning.   Ferrous Sulfate (IRON) 325 (65 Fe) MG TABS Take 65 mg by mouth daily.   Fluocinolone Acetonide 0.01 % OIL Place 1 application in ear(s) 2 (two) times daily as needed (ear itching).   GE100 BLOOD GLUCOSE TEST test strip CHECK BLOOD SUGAR ONCE DAILY OR AS DIRECTED BY PHYSICIAN   ipratropium (ATROVENT) 0.03 % nasal spray Place 2  sprays into both nostrils 3 (three) times daily as needed for rhinitis.   losartan (COZAAR) 100 MG tablet Take 100 mg by mouth in the morning.   meclizine  (ANTIVERT ) 25 MG tablet Take 12.5 mg by mouth 2 (two) times daily as needed.   metFORMIN  (GLUCOPHAGE -XR) 500 MG 24 hr tablet 1,000 mg 2 (two) times daily.   montelukast  (SINGULAIR ) 10 MG tablet TAKE 1 TABLET (10 MG TOTAL) BY MOUTH AT BEDTIME AS NEEDED (FOR ALLERGIES.).   PARoxetine  (PAXIL ) 40 MG tablet TAKE ONE TABLET BY MOUTH EVERY MORNING. (Patient taking differently: Take 40 mg by mouth in the morning.)   Semaglutide 14 MG TABS Take 14 mg by mouth daily.   verapamil  (CALAN -SR) 240 MG CR tablet TAKE ONE TABLET BY MOUTH EVERY EVENING. (Patient taking differently: Take 240 mg by mouth in the morning.)   vitamin B-12 (CYANOCOBALAMIN ) 500 MCG tablet Take 500 mcg by mouth in the morning.   No facility-administered encounter medications on file as of 03/12/2024.    ALLERGIES:  Allergies  Allergen Reactions   Prednisone Other (See Comments)    Hair loss    LABORATORY DATA:  I have reviewed the labs as listed.  CBC    Component Value Date/Time   WBC 5.4 03/03/2024 0830   RBC 4.72 03/03/2024 0830   HGB 14.0 03/03/2024 0830   HGB 13.6 02/12/2023 0843   HCT 43.0 03/03/2024 0830   HCT 41.3 02/12/2023 0843   PLT 99 (L) 03/03/2024 0830   PLT 79 (LL) 02/12/2023 0843   MCV 91.1 03/03/2024 0830   MCV 88 02/12/2023 0843   MCH 29.7 03/03/2024 0830   MCHC 32.6 03/03/2024 0830   RDW 13.0 03/03/2024 0830   RDW 12.2 02/12/2023 0843   LYMPHSABS 1.9 03/03/2024  0830   LYMPHSABS 1.3 02/12/2023 0843   MONOABS 0.4 03/03/2024 0830   EOSABS 0.3 03/03/2024 0830   EOSABS 0.4 02/12/2023 0843   BASOSABS 0.1 03/03/2024 0830   BASOSABS 0.1 02/12/2023 0843      Latest Ref Rng & Units 11/21/2023    9:06 AM 02/12/2023    8:43 AM 08/17/2021   10:19 AM  CMP  Glucose 70 - 99 mg/dL 161  92  69   BUN 8 - 23 mg/dL 30  21  41   Creatinine 0.44 - 1.00 mg/dL  0.96  0.45  4.09   Sodium 135 - 145 mmol/L 138  143  139   Potassium 3.5 - 5.1 mmol/L 3.7  4.2  4.4   Chloride 98 - 111 mmol/L 101  104  106   CO2 22 - 32 mmol/L 24  24  24    Calcium  8.9 - 10.3 mg/dL 81.1  91.4  78.2   Total Protein 6.0 - 8.5 g/dL  7.3  7.6   Total Bilirubin 0.0 - 1.2 mg/dL  <9.5  0.3   Alkaline Phos 44 - 121 IU/L  104  66   AST 0 - 40 IU/L  41  23   ALT 0 - 32 IU/L  26  16     DIAGNOSTIC IMAGING:  I have independently reviewed the relevant imaging and discussed with the patient.   WRAP UP:  All questions were answered. The patient knows to call the clinic with any problems, questions or concerns.  Medical decision making: Low  Time spent on visit: I spent 15 minutes counseling the patient face to face. The total time spent in the appointment was 22 minutes and more than 50% was on counseling.  Sonnie Dusky, PA-C  03/12/24 3:46 PM

## 2024-03-12 ENCOUNTER — Inpatient Hospital Stay (HOSPITAL_BASED_OUTPATIENT_CLINIC_OR_DEPARTMENT_OTHER): Admitting: Physician Assistant

## 2024-03-12 VITALS — BP 126/84 | HR 81 | Temp 97.9°F | Resp 18 | Ht 61.0 in | Wt 177.0 lb

## 2024-03-12 DIAGNOSIS — E538 Deficiency of other specified B group vitamins: Secondary | ICD-10-CM | POA: Diagnosis not present

## 2024-03-12 DIAGNOSIS — D696 Thrombocytopenia, unspecified: Secondary | ICD-10-CM

## 2024-03-12 DIAGNOSIS — E559 Vitamin D deficiency, unspecified: Secondary | ICD-10-CM | POA: Diagnosis not present

## 2024-03-12 DIAGNOSIS — E611 Iron deficiency: Secondary | ICD-10-CM | POA: Diagnosis not present

## 2024-03-12 DIAGNOSIS — Z79899 Other long term (current) drug therapy: Secondary | ICD-10-CM | POA: Diagnosis not present

## 2024-03-12 DIAGNOSIS — D509 Iron deficiency anemia, unspecified: Secondary | ICD-10-CM | POA: Diagnosis not present

## 2024-03-12 NOTE — Patient Instructions (Signed)
[facility] at [facility] Discharge Instructions  You were seen today by Rojelio Brenner PA-C for your low platelets and anemia (low red blood cells).  LOW PLATELETS: Your low platelets are mildly low, but stable at their baseline.  We will continue to watch these, but there is no treatment we need to give you for them right now.  IRON DEFICIENCY ANEMIA: Your blood and iron levels looked great!  Keep taking your B12 and iron and vitamin D pills.  LABS: Return in 6 months for repeat labs  FOLLOW-UP APPOINTMENT: Office visit in 6 months.   Thank you for choosing [facility] at [facility] to provide your oncology and hematology care.  To afford each patient quality time with our provider, please arrive at least 15 minutes before your scheduled appointment time.   If you have a lab appointment with the Cancer Center please come in thru the Main Entrance and check in at the main information desk.  You need to re-schedule your appointment should you arrive 10 or more minutes late.  We strive to give you quality time with our providers, and arriving late affects you and other patients whose appointments are after yours.  Also, if you no show three or more times for appointments you may be dismissed from the clinic at the providers discretion.     Again, thank you for choosing [facility].  Our hope is that these requests will decrease the amount of time that you wait before being seen by our physicians.       _____________________________________________________________  Should you have questions after your visit to [facility], please contact our office at [phone] and follow the prompts.  Our office hours are 8:00 a.m. and 4:30 p.m. Monday - Friday.  Please note that voicemails left after 4:00 p.m. may not be returned until the following business day.  We are closed weekends and major holidays.  You do have access to a  nurse 24-7, just call the main number to the clinic [phone] and do not press any options, hold on the line and a nurse will answer the phone.    For prescription refill requests, have your pharmacy contact our office and allow 72 hours.    Due to Covid, you will need to wear a mask upon entering the hospital. If you do not have a mask, a mask will be given to you at the Main Entrance upon arrival. For doctor visits, patients may have 1 support person age 19 or older with them. For treatment visits, patients can not have anyone with them due to social distancing guidelines and our immunocompromised population.

## 2024-04-04 DIAGNOSIS — L219 Seborrheic dermatitis, unspecified: Secondary | ICD-10-CM | POA: Diagnosis not present

## 2024-04-04 DIAGNOSIS — I872 Venous insufficiency (chronic) (peripheral): Secondary | ICD-10-CM | POA: Diagnosis not present

## 2024-04-04 DIAGNOSIS — L819 Disorder of pigmentation, unspecified: Secondary | ICD-10-CM | POA: Diagnosis not present

## 2024-04-04 DIAGNOSIS — R21 Rash and other nonspecific skin eruption: Secondary | ICD-10-CM | POA: Diagnosis not present

## 2024-04-09 ENCOUNTER — Ambulatory Visit (HOSPITAL_COMMUNITY)
Admission: RE | Admit: 2024-04-09 | Discharge: 2024-04-09 | Disposition: A | Discharge status: Home / Self Care | Source: Ambulatory Visit | Attending: Family Medicine | Admitting: Family Medicine

## 2024-04-09 DIAGNOSIS — Z1231 Encounter for screening mammogram for malignant neoplasm of breast: Secondary | ICD-10-CM | POA: Insufficient documentation

## 2024-04-10 ENCOUNTER — Other Ambulatory Visit (HOSPITAL_COMMUNITY)
Admission: RE | Admit: 2024-04-10 | Discharge: 2024-04-10 | Disposition: A | Discharge status: Home / Self Care | Source: Ambulatory Visit | Attending: Gastroenterology | Admitting: Gastroenterology

## 2024-04-10 ENCOUNTER — Ambulatory Visit (HOSPITAL_COMMUNITY)
Admission: RE | Admit: 2024-04-10 | Discharge: 2024-04-10 | Disposition: A | Discharge status: Home / Self Care | Source: Ambulatory Visit | Attending: Gastroenterology | Admitting: Gastroenterology

## 2024-04-10 DIAGNOSIS — K219 Gastro-esophageal reflux disease without esophagitis: Secondary | ICD-10-CM | POA: Insufficient documentation

## 2024-04-10 DIAGNOSIS — K760 Fatty (change of) liver, not elsewhere classified: Secondary | ICD-10-CM | POA: Diagnosis not present

## 2024-04-10 DIAGNOSIS — D696 Thrombocytopenia, unspecified: Secondary | ICD-10-CM | POA: Diagnosis not present

## 2024-04-10 DIAGNOSIS — R1013 Epigastric pain: Secondary | ICD-10-CM | POA: Insufficient documentation

## 2024-04-10 DIAGNOSIS — R1032 Left lower quadrant pain: Secondary | ICD-10-CM | POA: Insufficient documentation

## 2024-04-10 DIAGNOSIS — K449 Diaphragmatic hernia without obstruction or gangrene: Secondary | ICD-10-CM | POA: Diagnosis not present

## 2024-04-10 LAB — COMPREHENSIVE METABOLIC PANEL WITH GFR
ALT: 18 U/L (ref 0–44)
AST: 26 U/L (ref 15–41)
Albumin: 4.1 g/dL (ref 3.5–5.0)
Alkaline Phosphatase: 73 U/L (ref 38–126)
Anion gap: 11 (ref 5–15)
BUN: 30 mg/dL — ABNORMAL HIGH (ref 8–23)
CO2: 24 mmol/L (ref 22–32)
Calcium: 10.2 mg/dL (ref 8.9–10.3)
Chloride: 105 mmol/L (ref 98–111)
Creatinine, Ser: 1.38 mg/dL — ABNORMAL HIGH (ref 0.44–1.00)
GFR, Estimated: 42 mL/min — ABNORMAL LOW (ref >60)
Glucose, Bld: 96 mg/dL (ref 70–99)
Potassium: 3.8 mmol/L (ref 3.5–5.1)
Sodium: 140 mmol/L (ref 135–145)
Total Bilirubin: 0.7 mg/dL (ref 0.0–1.2)
Total Protein: 7.8 g/dL (ref 6.5–8.1)

## 2024-04-10 LAB — PROTIME-INR
INR: 1 (ref 0.8–1.2)
Prothrombin Time: 13.8 s (ref 11.4–15.2)

## 2024-04-10 LAB — LIPASE, BLOOD: Lipase: 83 U/L — ABNORMAL HIGH (ref 11–51)

## 2024-04-10 MED ORDER — IOHEXOL 300 MG/ML  SOLN
80.0000 mL | Freq: Once | INTRAMUSCULAR | Status: AC | PRN
  Administered 2024-04-10: 80 mL via INTRAVENOUS

## 2024-04-17 ENCOUNTER — Ambulatory Visit: Payer: Self-pay | Admitting: Gastroenterology

## 2024-04-18 ENCOUNTER — Ambulatory Visit: Payer: Self-pay | Admitting: Gastroenterology

## 2024-04-24 ENCOUNTER — Telehealth: Payer: Self-pay

## 2024-04-24 MED ORDER — FAMOTIDINE 20 MG PO TABS: 20.0000 mg | ORAL_TABLET | Freq: Every day | ORAL | 11 refills | Status: DC

## 2024-04-24 NOTE — Telephone Encounter (Signed)
 Pt is requesting refills on her famotidine . Please send to [facility]

## 2024-04-24 NOTE — Addendum Note (Signed)
 Addended by: EZZARD SONNY RAMAN on: 04/24/2024 11:47 AM   Modules accepted: Orders

## 2024-04-29 ENCOUNTER — Other Ambulatory Visit: Payer: Self-pay | Admitting: Gastroenterology

## 2024-04-29 NOTE — Telephone Encounter (Signed)
 Duplicate Rx was sent in 04/24/2024

## 2024-04-30 ENCOUNTER — Telehealth: Payer: Self-pay | Admitting: *Deleted

## 2024-04-30 DIAGNOSIS — N261 Atrophy of kidney (terminal): Secondary | ICD-10-CM | POA: Diagnosis not present

## 2024-04-30 DIAGNOSIS — N183 Chronic kidney disease, stage 3 unspecified: Secondary | ICD-10-CM | POA: Diagnosis not present

## 2024-04-30 MED ORDER — FAMOTIDINE 20 MG PO TABS: 20.0000 mg | ORAL_TABLET | Freq: Every day | ORAL | 11 refills | Status: AC

## 2024-04-30 NOTE — Addendum Note (Signed)
 Addended by: EZZARD SONNY RAMAN on: 04/30/2024 04:46 PM   Modules accepted: Orders

## 2024-04-30 NOTE — Telephone Encounter (Signed)
 Pt called again yelling in the phone saying she needs her famotidine  sent to [facility] in [place].

## 2024-04-30 NOTE — Telephone Encounter (Signed)
 Please apologize to patient for me: I refilled her famotidine  and sent it to [facility] by accident (it was the selected pharmacy). Then yesterday, Dena marked refill as a duplicate and denied it.  I have sent new rx to [facility].

## 2024-05-01 NOTE — Telephone Encounter (Signed)
 Noted. Informed the pt the pt

## 2024-05-20 DIAGNOSIS — N898 Other specified noninflammatory disorders of vagina: Secondary | ICD-10-CM | POA: Diagnosis not present

## 2024-05-22 DIAGNOSIS — L602 Onychogryphosis: Secondary | ICD-10-CM | POA: Diagnosis not present

## 2024-05-22 DIAGNOSIS — M2041 Other hammer toe(s) (acquired), right foot: Secondary | ICD-10-CM | POA: Diagnosis not present

## 2024-05-22 DIAGNOSIS — M2042 Other hammer toe(s) (acquired), left foot: Secondary | ICD-10-CM | POA: Diagnosis not present

## 2024-05-22 DIAGNOSIS — L603 Nail dystrophy: Secondary | ICD-10-CM | POA: Diagnosis not present

## 2024-05-22 DIAGNOSIS — E1149 Type 2 diabetes mellitus with other diabetic neurological complication: Secondary | ICD-10-CM | POA: Diagnosis not present

## 2024-05-22 DIAGNOSIS — B351 Tinea unguium: Secondary | ICD-10-CM | POA: Diagnosis not present

## 2024-05-22 DIAGNOSIS — L608 Other nail disorders: Secondary | ICD-10-CM | POA: Diagnosis not present

## 2024-05-22 DIAGNOSIS — L600 Ingrowing nail: Secondary | ICD-10-CM | POA: Diagnosis not present

## 2024-05-22 DIAGNOSIS — L84 Corns and callosities: Secondary | ICD-10-CM | POA: Diagnosis not present

## 2024-05-23 DIAGNOSIS — E1143 Type 2 diabetes mellitus with diabetic autonomic (poly)neuropathy: Secondary | ICD-10-CM | POA: Diagnosis not present

## 2024-05-23 DIAGNOSIS — J310 Chronic rhinitis: Secondary | ICD-10-CM | POA: Diagnosis not present

## 2024-05-23 DIAGNOSIS — E538 Deficiency of other specified B group vitamins: Secondary | ICD-10-CM | POA: Diagnosis not present

## 2024-05-23 DIAGNOSIS — I10 Essential (primary) hypertension: Secondary | ICD-10-CM | POA: Diagnosis not present

## 2024-05-23 DIAGNOSIS — Z23 Encounter for immunization: Secondary | ICD-10-CM | POA: Diagnosis not present

## 2024-05-23 DIAGNOSIS — Z0000 Encounter for general adult medical examination without abnormal findings: Secondary | ICD-10-CM | POA: Diagnosis not present

## 2024-05-23 DIAGNOSIS — E559 Vitamin D deficiency, unspecified: Secondary | ICD-10-CM | POA: Diagnosis not present

## 2024-05-23 DIAGNOSIS — Z79899 Other long term (current) drug therapy: Secondary | ICD-10-CM | POA: Diagnosis not present

## 2024-05-23 DIAGNOSIS — E2839 Other primary ovarian failure: Secondary | ICD-10-CM | POA: Diagnosis not present

## 2024-05-23 DIAGNOSIS — E782 Mixed hyperlipidemia: Secondary | ICD-10-CM | POA: Diagnosis not present

## 2024-05-23 DIAGNOSIS — F419 Anxiety disorder, unspecified: Secondary | ICD-10-CM | POA: Diagnosis not present

## 2024-05-23 DIAGNOSIS — D696 Thrombocytopenia, unspecified: Secondary | ICD-10-CM | POA: Diagnosis not present

## 2024-05-26 ENCOUNTER — Other Ambulatory Visit (HOSPITAL_COMMUNITY): Payer: Self-pay | Admitting: Family Medicine

## 2024-05-26 ENCOUNTER — Telehealth: Payer: Self-pay | Admitting: Internal Medicine

## 2024-05-26 DIAGNOSIS — E2839 Other primary ovarian failure: Secondary | ICD-10-CM

## 2024-05-26 NOTE — Telephone Encounter (Signed)
 Barbara House's office called to request the most recent colonoscopy report.  I faxed over to that office.

## 2024-05-28 ENCOUNTER — Other Ambulatory Visit: Payer: Self-pay

## 2024-06-04 ENCOUNTER — Ambulatory Visit (HOSPITAL_COMMUNITY)
Admission: RE | Admit: 2024-06-04 | Discharge: 2024-06-04 | Disposition: A | Discharge status: Home / Self Care | Source: Ambulatory Visit | Attending: Family Medicine | Admitting: Family Medicine

## 2024-06-04 DIAGNOSIS — M85852 Other specified disorders of bone density and structure, left thigh: Secondary | ICD-10-CM | POA: Diagnosis not present

## 2024-06-04 DIAGNOSIS — E2839 Other primary ovarian failure: Secondary | ICD-10-CM | POA: Diagnosis not present

## 2024-06-04 DIAGNOSIS — Z780 Asymptomatic menopausal state: Secondary | ICD-10-CM | POA: Diagnosis not present

## 2024-06-10 DIAGNOSIS — E118 Type 2 diabetes mellitus with unspecified complications: Secondary | ICD-10-CM | POA: Diagnosis not present

## 2024-06-30 ENCOUNTER — Telehealth: Payer: Self-pay

## 2024-06-30 NOTE — Telephone Encounter (Signed)
 done

## 2024-06-30 NOTE — Telephone Encounter (Signed)
 Medication review request was received via fax to be filled out and faxed back. In box to be filled out.

## 2024-07-01 ENCOUNTER — Other Ambulatory Visit: Payer: Self-pay | Admitting: Gastroenterology

## 2024-07-07 DIAGNOSIS — I872 Venous insufficiency (chronic) (peripheral): Secondary | ICD-10-CM | POA: Diagnosis not present

## 2024-07-07 DIAGNOSIS — L219 Seborrheic dermatitis, unspecified: Secondary | ICD-10-CM | POA: Diagnosis not present

## 2024-07-07 DIAGNOSIS — L819 Disorder of pigmentation, unspecified: Secondary | ICD-10-CM | POA: Diagnosis not present

## 2024-07-23 DIAGNOSIS — N1832 Chronic kidney disease, stage 3b: Secondary | ICD-10-CM | POA: Diagnosis not present

## 2024-07-25 DIAGNOSIS — J301 Allergic rhinitis due to pollen: Secondary | ICD-10-CM | POA: Diagnosis not present

## 2024-07-25 DIAGNOSIS — T7809XD Anaphylactic reaction due to other food products, subsequent encounter: Secondary | ICD-10-CM | POA: Diagnosis not present

## 2024-07-28 DIAGNOSIS — I129 Hypertensive chronic kidney disease with stage 1 through stage 4 chronic kidney disease, or unspecified chronic kidney disease: Secondary | ICD-10-CM | POA: Diagnosis not present

## 2024-07-28 DIAGNOSIS — N1831 Chronic kidney disease, stage 3a: Secondary | ICD-10-CM | POA: Diagnosis not present

## 2024-07-28 DIAGNOSIS — E1122 Type 2 diabetes mellitus with diabetic chronic kidney disease: Secondary | ICD-10-CM | POA: Diagnosis not present

## 2024-08-11 DIAGNOSIS — J301 Allergic rhinitis due to pollen: Secondary | ICD-10-CM | POA: Diagnosis not present

## 2024-08-11 DIAGNOSIS — T7809XD Anaphylactic reaction due to other food products, subsequent encounter: Secondary | ICD-10-CM | POA: Diagnosis not present

## 2024-08-25 ENCOUNTER — Encounter: Payer: Self-pay | Admitting: Gastroenterology

## 2024-08-25 ENCOUNTER — Ambulatory Visit (INDEPENDENT_AMBULATORY_CARE_PROVIDER_SITE_OTHER): Admitting: Gastroenterology

## 2024-08-25 VITALS — BP 123/76 | HR 64 | Temp 98.5°F | Ht 61.0 in | Wt 182.0 lb

## 2024-08-25 DIAGNOSIS — K625 Hemorrhage of anus and rectum: Secondary | ICD-10-CM

## 2024-08-25 DIAGNOSIS — K760 Fatty (change of) liver, not elsewhere classified: Secondary | ICD-10-CM | POA: Diagnosis not present

## 2024-08-25 NOTE — Progress Notes (Signed)
 GI Office Note    Referring Provider: Rolinda Millman, MD Primary Care Physician:  Rolinda Millman, MD  Primary Gastroenterologist: Carlin POUR. Cindie, DO   Chief Complaint   Chief Complaint  Patient presents with   Follow-up    Follow up after CT scan. Pt has a complaint of bleeding with BM's X 1 month    History of Present Illness   Barbara House is a 68 y.o. female presenting today for follow-up.  Patient last seen in May 2025. H/o GERD, dysphagia s/p dilation, gastroparesis,hepatic steatosis, adenomatous colon polyps, chronic left sided abd pain/left flank pain with CTs unrevealing (suspected MSK in nature), thrombocytopenia followed by hematology.   Completed CT scan in June 2025 because of complaints of postprandial abdominal pain occurring since her colonoscopy few months prior.  Study was unremarkable.   Discussed the use of AI scribe software for clinical note transcription with the patient, who gave verbal consent to proceed.  History of Present Illness   Barbara House is a 68 year old female with internal hemorrhoids who presents with rectal bleeding and itching.  She has been experiencing rectal bleeding since September, with fresh blood on the tissue after bowel movements, especially with increased frequency of bowel movements. The bleeding is not mixed with the stool. She experiences itching and soreness related to her hemorrhoids, which she manages with a topical cream during severe episodes. The cream is used for several days until symptoms improve. She also describes swelling and hardness externally when wiping, attributing these to hemorrhoids.  No constipation or need to strain during bowel movements. She occasionally experiences diarrhea, particularly after consuming certain foods. Previously had persistent diarrhea while on metformin  and Janumet, which resolved after discontinuation.   No heartburn issues. Constant abdominal soreness. Nothing specific.      Wt Readings from Last 10 Encounters:  08/25/24 182 lb (82.6 kg)  03/12/24 177 lb (80.3 kg)  02/27/24 178 lb (80.7 kg)  11/26/23 177 lb (80.3 kg)  10/30/23 179 lb 6.4 oz (81.4 kg)  08/29/23 184 lb 1.6 oz (83.5 kg)  04/25/23 191 lb 9.6 oz (86.9 kg)  03/01/23 194 lb (88 kg)  02/05/23 188 lb 12.8 oz (85.6 kg)  10/27/22 192 lb 12.8 oz (87.5 kg)    Prior Data    CT abdomen pelvis with contrast June 2025: IMPRESSION: -No acute findings within the abdomen or pelvis. -normal spleen -Tiny hiatal hernia.   CT chest without contrast 09/2022: IMPRESSION: 1. No pulmonary nodules requiring follow-up. 2. 1.5 cm low-attenuation right thyroid  nodule. Recommend thyroid  ultrasound. (Ref: J Am Coll Radiol. 2015 Feb;12(2): 143-50). 3. Hepatic steatosis.   CT A/P with contrast 08/2021: Unremarkable liver. Spleen normal.  Stable right renal cyst.   EGD August 2022: Normal esophagus status post empiric dilation with mild resistance at 20 mm due to possible esophageal web, normal examined stomach and duodenum, small hiatal hernia.  EGD April 2022 with normal-appearing esophagus but empiric dilation for history of dysphagia cannot be completed due to large amount of residual food in the stomach. She did not follow with recommendations for clear liquid diet after 6 PM the day prior.   Colonoscopy 11/2023: -non-bleeding internal hemorrhoids -one 5mm polyp in proximal transverse colon, removed with cold snare, hyperplastic -repeat colonoscopy in 10 years  Colonoscopy January 2020 with internal/external hemorrhoids, 3 mm cecal tubular adenoma removed. Next colonoscopy in 5 to10 years.   Medications   Current Outpatient Medications  Medication Sig Dispense Refill  acetaminophen  (TYLENOL ) 500 MG tablet 1 tablet as needed Orally every 6 hrs     atorvastatin  (LIPITOR ) 20 MG tablet Take 20 mg by mouth in the morning.     chlorthalidone (HYGROTON) 25 MG tablet Take 25 mg by mouth every morning.      Cholecalciferol (VITAMIN D3) 125 MCG (5000 UT) CAPS Take 5,000 Units by mouth in the morning.     cycloSPORINE  (RESTASIS ) 0.05 % ophthalmic emulsion Place 1 drop into both eyes 2 (two) times daily as needed (dry eyes).     dexlansoprazole  (DEXILANT ) 60 MG capsule TAKE 1 CAPSULE BY MOUTH EVERY DAY BEFORE BREAKFAST 90 capsule 3   Diclofenac  Sodium 1.6 % GEL Apply 2 g topically 4 (four) times daily as needed. Apply to left elbow and lower bicep region 4 times daily 50 g 0   famotidine  (PEPCID ) 20 MG tablet Take 1 tablet (20 mg total) by mouth daily before supper. 30 tablet 11   FARXIGA 10 MG TABS tablet Take 10 mg by mouth in the morning.     Ferrous Sulfate (IRON) 325 (65 Fe) MG TABS Take 65 mg by mouth daily.     Fluocinolone Acetonide 0.01 % OIL Place 1 application in ear(s) 2 (two) times daily as needed (ear itching).     GE100 BLOOD GLUCOSE TEST test strip CHECK BLOOD SUGAR ONCE DAILY OR AS DIRECTED BY PHYSICIAN     ipratropium (ATROVENT) 0.03 % nasal spray Place 2 sprays into both nostrils 3 (three) times daily as needed for rhinitis.     losartan (COZAAR) 100 MG tablet Take 100 mg by mouth in the morning.     meclizine  (ANTIVERT ) 25 MG tablet Take 12.5 mg by mouth 2 (two) times daily as needed.     metFORMIN  (GLUCOPHAGE -XR) 500 MG 24 hr tablet 1,000 mg 2 (two) times daily.     montelukast  (SINGULAIR ) 10 MG tablet TAKE 1 TABLET (10 MG TOTAL) BY MOUTH AT BEDTIME AS NEEDED (FOR ALLERGIES.). 90 tablet 1   PARoxetine  (PAXIL ) 40 MG tablet TAKE ONE TABLET BY MOUTH EVERY MORNING. 90 tablet 0   Semaglutide 14 MG TABS Take 14 mg by mouth daily.     verapamil  (CALAN -SR) 240 MG CR tablet TAKE ONE TABLET BY MOUTH EVERY EVENING. 90 tablet 0   vitamin B-12 (CYANOCOBALAMIN ) 500 MCG tablet Take 500 mcg by mouth in the morning.     No current facility-administered medications for this visit.    Allergies   Allergies as of 08/25/2024 - Review Complete 08/25/2024  Allergen Reaction Noted   Prednisone  Other (See Comments) 09/15/2016        Review of Systems   General: Negative for anorexia, weight loss, fever, chills, fatigue, weakness. ENT: Negative for hoarseness, difficulty swallowing , nasal congestion. CV: Negative for chest pain, angina, palpitations, dyspnea on exertion, peripheral edema.  Respiratory: Negative for dyspnea at rest, dyspnea on exertion, cough, sputum, wheezing.  GI: See history of present illness. GU:  Negative for dysuria, hematuria, urinary incontinence, urinary frequency, nocturnal urination.  Endo: Negative for unusual weight change.     Physical Exam   BP 123/76   Pulse 64   Temp 98.5 F (36.9 C)   Ht 5' 1 (1.549 m)   Wt 182 lb (82.6 kg)   BMI 34.39 kg/m    General: Well-nourished, well-developed in no acute distress.  Eyes: No icterus. Mouth: Oropharyngeal mucosa moist and pink   Abdomen: Bowel sounds are normal, nontender, nondistended, no hepatosplenomegaly or masses,  no abdominal bruits or hernia , no rebound or guarding.  Rectal: perianal area with redundant tissue, no obvious hemorrhoidal tissue. Internal exam with palpable internal hemorrhoids, no masses, no gross blood, nontender exam Extremities: No lower extremity edema. No clubbing or deformities. Neuro: Alert and oriented x 4   Skin: Warm and dry, no jaundice.   Psych: Alert and cooperative, normal mood and affect.  Labs   Lab Results  Component Value Date   INR 1.0 04/10/2024   Lab Results  Component Value Date   NA 140 04/10/2024   CL 105 04/10/2024   K 3.8 04/10/2024   CO2 24 04/10/2024   BUN 30 (H) 04/10/2024   CREATININE 1.38 (H) 04/10/2024   GFRNONAA 42 (L) 04/10/2024   CALCIUM  10.2 04/10/2024   ALBUMIN 4.1 04/10/2024   GLUCOSE 96 04/10/2024   Lab Results  Component Value Date   ALT 18 04/10/2024   AST 26 04/10/2024   ALKPHOS 73 04/10/2024   BILITOT 0.7 04/10/2024   Lab Results  Component Value Date   WBC 5.4 03/03/2024   HGB 14.0 03/03/2024   HCT  43.0 03/03/2024   MCV 91.1 03/03/2024   PLT 99 (L) 03/03/2024   Lab Results  Component Value Date   IRON 77 03/03/2024   TIBC 412 03/03/2024   FERRITIN 133 03/03/2024   Lab Results  Component Value Date   VITAMINB12 702 03/03/2024   Lab Results  Component Value Date   FOLATE 13.0 05/12/2021   Lab Results  Component Value Date   LIPASE 83 (H) 04/10/2024    Imaging Studies   No results found.  Assessment/Plan:      GERD/gastroparesis: symptoms stable -encouraged multiple small meals/snacks throughout daily -avoid fatty foods -continue Dexilant  60mg  before BF -continue famotidine  20mg  at supper -reinforced antireflux measures   Thrombocytopenia: Followed by hematology.  History of fatty liver on prior imaging, CT chest.  No splenomegaly or portal HTN, liver unremarkable 03/2024. No evidence of portal hypertension on EGD in 2022.  She has had mildly elevated AST which is somewhat nonspecific.  See below for fatty liver instructions -follow up labs    Hepatic steatosis: Noted on prior imaging of the CT chest.  Chronic thrombocytopenia followed by hematology. CT A/P with contrast 03/2024 with unremarkable liver, normal sized spleen -follow up CT A/P.  -CMET, CBC, INR, ELF   Elevated lipase: -nonspecific -CT with normal pancreas in 03/2024 -update labs at this time  Rectal bleeding/internal hemorrhoids: -frequent brbpr on toilet tissue, suspect hemorrhoid related given recent colonoscopy -exam as outlined above -patient interested in hemorrhoid banding, will update labs and if overall unremarkable, consider banding (will discuss with Anna/Courtney given chronic thrombocytopenia).       Sonny RAMAN. Ezzard, MHS, PA-C [facility]

## 2024-08-25 NOTE — Patient Instructions (Signed)
 Please complete labs to further evaluate your fatty liver.  After your labs return and if everything looks ok, we will bring you back for hemorrhoid banding.

## 2024-08-27 ENCOUNTER — Encounter: Payer: Self-pay | Admitting: Allergy & Immunology

## 2024-08-27 ENCOUNTER — Other Ambulatory Visit: Payer: Self-pay

## 2024-08-27 ENCOUNTER — Ambulatory Visit (INDEPENDENT_AMBULATORY_CARE_PROVIDER_SITE_OTHER): Admitting: Allergy & Immunology

## 2024-08-27 VITALS — BP 130/80 | HR 67 | Temp 98.2°F | Resp 18 | Ht 61.42 in | Wt 181.8 lb

## 2024-08-27 DIAGNOSIS — J310 Chronic rhinitis: Secondary | ICD-10-CM | POA: Diagnosis not present

## 2024-08-27 DIAGNOSIS — T7803XD Anaphylactic reaction due to other fish, subsequent encounter: Secondary | ICD-10-CM

## 2024-08-27 DIAGNOSIS — T7803XA Anaphylactic reaction due to other fish, initial encounter: Secondary | ICD-10-CM

## 2024-08-27 DIAGNOSIS — B999 Unspecified infectious disease: Secondary | ICD-10-CM | POA: Diagnosis not present

## 2024-08-27 DIAGNOSIS — K219 Gastro-esophageal reflux disease without esophagitis: Secondary | ICD-10-CM | POA: Diagnosis not present

## 2024-08-27 MED ORDER — EPINEPHRINE 0.3 MG/0.3ML IJ SOAJ: 0.3000 mg | Freq: Once | INTRAMUSCULAR | 2 refills | Status: AC

## 2024-08-27 NOTE — Progress Notes (Addendum)
 NEW PATIENT  Date of Service/Encounter:  08/27/24  Consult requested by: Rolinda Millman, MD   Assessment:   Perennial allergic rhinitis    Chronic rhinosinusitis - Augmentin  called in on 07/28/19   Gastroesophageal reflux disease - controlled on Dexliant and ranitidine   Recurrent infections - needs Tdap and repeat Diphtheria and Tetanus titers 4-6 weeks afterwards  New onset reaction secondary to shrimp - rash (? blistering) although there are no pictures   Plan/Recommendations:   1. Chronic rhinitis - Because of insurance stipulations, we cannot do skin testing on the same day as your first visit. - We are all working to fight this, but for now we need to do two separate visits.  - We will know more after we do testing at the next visit.  - The skin testing visit can be squeezed in at your convenience.  - Then we can make a more full plan to address all of your symptoms. - Be sure to stop your antihistamines for 3 days before this appointment.  - We are going to get the notes from your other allergy doctor to put some things together.   2. Seafood allergy - new onset - We will get some blood work to look at your IgE levels to seafood. - EpiPen sent in today.   3. Gastroesophageal reflux disease - Continue with your   4. Recurrent infections - We will obtain some screening labs to evaluate your immune system.  - Labs to evaluate the quantitative [facility]) aspects of your immune system: IgG/IgA/IgM, CBC with differential - Labs to evaluate the qualitative (HOW WELL THEY WORK) aspects of your immune system: CH50, Pneumococcal titers, Tetanus titers, Diphtheria titers - We may consider immunizations with Pneumovax and Tdap to challenge your immune system, and then obtain repeat titers in 4-6 weeks.   5. Return in about 2 weeks (around 09/10/2024) for SKIN TESTING (1-55 + SEAFOOD). You can have the follow up appointment with Dr. Iva or a Nurse Practicioner (our  Nurse Practitioners are excellent and always have Physician oversight!).    This note in its entirety was forwarded to the Provider who requested this consultation.  Subjective:   Barbara House is a 68 y.o. 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   Lower abdominal pain 04/21/2021   Gastroparesis 04/21/2021   Chronic pain 03/28/2021   Chronic sinusitis 03/28/2021   Hammer toe 03/28/2021   Morbid obesity (HCC) 03/28/2021   Pure hypercholesterolemia 03/28/2021   Solitary pulmonary nodule 03/28/2021   Vitamin D  deficiency 03/28/2021   Left flank pain 01/07/2021   Left-sided back pain 01/07/2021   Bloating 01/07/2021   LUQ pain 04/27/2020   Early satiety 03/02/2020   Thrombocytopenia 08/29/2019   AKI (acute kidney injury) 07/31/2019   Gastritis without peptic ulcer disease    Esophageal dysphagia 08/09/2018   Encounter for screening colonoscopy 08/09/2018   Hyperlipidemia LDL goal <70 10/12/2017   Anxiety disorder    Type  2 diabetes mellitus (HCC)    Hypertension    Allergic rhinitis    Angina pectoris 08/03/2016   Abnormal nuclear stress test 08/03/2016   Abnormal myocardial perfusion study    Rhinitis 06/25/2015   Gastroesophageal reflux disease 06/25/2015   Diabetes mellitus (HCC) 06/25/2015    History obtained from: chart review and patient.  Discussed the use of AI scribe software for clinical note transcription with the patient and/or guardian, who gave verbal consent to proceed.  Barbara House was referred by Rolinda Millman, MD.     Barbara House is a 68 y.o. female presenting for an evaluation of a new seafood allergy. She has seen an allergist in the meantime since I last saw her - someone in [place]. She will sign a form for releasing the information.   Allergic Rhinitis Symptom History: She has a history of sinus problems and has been experiencing nasal congestion, rhinorrhea, and facial soreness. She uses Flonase  and takes Singulair  daily. Her nose runs, and she experiences post-nasal drip, which sometimes leads to coughing up phlegm. Her face remains sore, particularly on the left side, and she experiences earaches. She has not had a sinus CT scan but recalls a nasal examination. She has been exposed to secondhand smoke from her son and others, which she finds intolerable.   Food Allergy Symptom History: In September, she experienced a severe allergic reaction after consuming three jumbo shrimp cooked in butter by her son. She described the room becoming 'real dark and cloudy,' and felt unsteady. She developed intense itching and a blistery rash on her legs, lasting three to four days, along with welts on her face. This was her first allergic reaction to seafood, despite having eaten fish regularly since 1982 or 1983. She did not seek hospital care at the time but reported the incident to her PCP. She does not have an EpiPen, although she was told she would be given one, but has not received it.  Skin Symptom History: She reports red, itchy rashes on her arms and hands that occasionally appear. She does not have any at this point in time.   GERD Symptom History: She takes medication for heartburn, including Dexilant  and famotidine , which she takes daily with supper.  Infection Symptom History: She has a history of frequent sinus infections but no history of pneumonia or asthma.  Otherwise, there is no history of other atopic diseases, including drug allergies, stinging insect  allergies, or contact dermatitis. There is no significant infectious history. Vaccinations are up to date.    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   Lower abdominal pain 04/21/2021   Gastroparesis 04/21/2021   Chronic pain 03/28/2021   Chronic sinusitis 03/28/2021   Hammer toe 03/28/2021   Morbid obesity (HCC) 03/28/2021   Pure hypercholesterolemia 03/28/2021   Solitary pulmonary nodule 03/28/2021   Vitamin D  deficiency 03/28/2021   Left flank pain 01/07/2021   Left-sided back pain 01/07/2021   Bloating 01/07/2021   LUQ pain 04/27/2020   Early satiety 03/02/2020   Thrombocytopenia 08/29/2019   AKI (acute kidney injury) 07/31/2019   Gastritis without peptic ulcer disease    Esophageal dysphagia 08/09/2018   Encounter for  screening colonoscopy 08/09/2018   Hyperlipidemia LDL goal <70 10/12/2017   Anxiety disorder    Type 2 diabetes mellitus (HCC)    Hypertension    Allergic rhinitis    Angina pectoris 08/03/2016   Abnormal nuclear stress test 08/03/2016   Abnormal myocardial perfusion study    Rhinitis 06/25/2015   Gastroesophageal reflux disease 06/25/2015   Diabetes mellitus (HCC) 06/25/2015    Medication List:  Allergies as of 08/27/2024       Reactions   Prednisone Other (See Comments)   Hair loss        Medication List        Accurate as of August 27, 2024 12:18 PM. If you have any questions, ask your nurse or doctor.          acetaminophen  500 MG tablet Commonly known as: TYLENOL  1 tablet as needed Orally every 6 hrs   atorvastatin  20 MG tablet Commonly known as: LIPITOR  Take 20 mg by mouth in the morning.   chlorthalidone 25 MG  tablet Commonly known as: HYGROTON Take 25 mg by mouth every morning.   cyanocobalamin  500 MCG tablet Commonly known as: VITAMIN B12 Take 500 mcg by mouth in the morning.   cycloSPORINE  0.05 % ophthalmic emulsion Commonly known as: RESTASIS  Place 1 drop into both eyes 2 (two) times daily as needed (dry eyes).   dexlansoprazole  60 MG capsule Commonly known as: DEXILANT  TAKE 1 CAPSULE BY MOUTH EVERY DAY BEFORE BREAKFAST   Diclofenac  Sodium 1.6 % Gel Apply 2 g topically 4 (four) times daily as needed. Apply to left elbow and lower bicep region 4 times daily   EPINEPHrine 0.3 mg/0.3 mL Soaj injection Commonly known as: EpiPen 2-Pak Inject 0.3 mg into the muscle once for 1 dose. Started by: Marty Morton Shaggy   famotidine  20 MG tablet Commonly known as: Pepcid  Take 1 tablet (20 mg total) by mouth daily before supper.   Farxiga 10 MG Tabs tablet Generic drug: dapagliflozin propanediol Take 10 mg by mouth in the morning.   Fluocinolone Acetonide 0.01 % Oil Place 1 application in ear(s) 2 (two) times daily as needed (ear itching).   GE100 Blood Glucose Test test strip Generic drug: glucose blood CHECK BLOOD SUGAR ONCE DAILY OR AS DIRECTED BY PHYSICIAN   ipratropium 0.03 % nasal spray Commonly known as: ATROVENT Place 2 sprays into both nostrils 3 (three) times daily as needed for rhinitis.   Iron 325 (65 Fe) MG Tabs Take 65 mg by mouth daily.   losartan 100 MG tablet Commonly known as: COZAAR Take 100 mg by mouth in the morning.   meclizine  25 MG tablet Commonly known as: ANTIVERT  Take 12.5 mg by mouth 2 (two) times daily as needed.   metFORMIN  500 MG 24 hr tablet Commonly known as: GLUCOPHAGE -XR 1,000 mg 2 (two) times daily.   montelukast  10 MG tablet Commonly known as: SINGULAIR  TAKE 1 TABLET (10 MG TOTAL) BY MOUTH AT BEDTIME AS NEEDED (FOR ALLERGIES.).   PARoxetine  40 MG tablet Commonly known as: PAXIL  TAKE ONE TABLET BY MOUTH EVERY MORNING.    Semaglutide 14 MG Tabs Take 14 mg by mouth daily.   verapamil  240 MG CR tablet Commonly known as: CALAN -SR TAKE ONE TABLET BY MOUTH EVERY EVENING.   Vitamin D3 125 MCG (5000 UT) Caps Take 5,000 Units by mouth in the morning.        Birth History: non-contributory  Developmental History: non-contributory  Past Surgical History: Past Surgical History:  Procedure Laterality Date  ABDOMINAL HYSTERECTOMY     has ovaries.    BALLOON DILATION N/A 05/17/2021   Procedure: BALLOON DILATION;  Surgeon: Cindie Carlin POUR, DO;  [facility];  Service: Endoscopy;  Laterality: N/A;   BIOPSY  01/31/2021   Procedure: BIOPSY;  Surgeon: Cindie Carlin POUR, DO;  [facility]: [facility];  Service: Endoscopy;;   CARDIAC CATHETERIZATION N/A 08/03/2016   Procedure: Left Heart Cath and Coronary Angiography;  Surgeon: Peter M Jordan, MD;  Location: [facility] INVAS[facility] CV [facility];  Service: Cardiovascular;  Laterality: N/A;   COLONOSCOPY WITH PROPOFOL  N/A 11/05/2018   Dr. Harvey: int/ext hemorrhoids, 3mm cecal tubular adenoma removed, next TCS in 5-10 years   COLONOSCOPY WITH PROPOFOL  N/A 11/26/2023   Procedure: COLONOSCOPY WITH PROPOFOL ;  Surgeon: Cindie Carlin POUR, DO;  [facility]: [facility];  Service: Endoscopy;  Laterality: N/A;  8:45 am, asa 3   ESOPHAGOGASTRODUODENOSCOPY (EGD) WITH PROPOFOL  N/A 11/05/2018   Dr. Harvey: esophagus was dilated, mild gastritis with benign bx   ESOPHAGOGASTRODUODENOSCOPY (EGD) WITH PROPOFOL  N/A 01/31/2021   Surgeon: Cindie Carlin POUR, DO;  Normal esophagus, large amount of food residue in the stomach, gastritis biopsied (mild chronic gastritis, negative for H. pylori), normal examined duodenum s/p biopsy (benign).   ESOPHAGOGASTRODUODENOSCOPY (EGD) WITH PROPOFOL  N/A 05/17/2021   Surgeon: Cindie Carlin POUR, DO; normal esophagus s/p empiric dilation with mild resistance at 20 mm due to possible esophageal web, normal examined stomach and duodenum, small hiatal hernia.    Miinor skin surgery     POLYPECTOMY  11/05/2018   Procedure: POLYPECTOMY;  Surgeon: Harvey Margo CROME, MD;  [facility];  Service: Endoscopy;;  cecal polyp   POLYPECTOMY  11/26/2023   Procedure: POLYPECTOMY;  Surgeon: Cindie Carlin POUR, DO;  [facility];  Service: Endoscopy;;   SAVORY DILATION N/A 11/05/2018   Procedure: SAVORY DILATION;  Surgeon: Harvey Margo CROME, MD;  [facility];  Service: Endoscopy;  Laterality: N/A;     Family History: Family History  Problem Relation Age of Onset   Hypertension Mother    Diabetes Mother    Hypertension Son    Diabetes Son    Cancer Father    Cirrhosis Brother    Alcohol abuse Brother    Colon cancer Neg Hx      Social History: Ariea lives at home with her family. She lives in a house that is 66 years old. There is vinyl flooring throughout the home. Tere is wood for heating and window units for cooling. There are dust mite coverings on the bedding. There is tobacco exposure in the home. There is no fume, chemical, or dust exposure in the home. They do not live near an interstate or industrial area. There is no HEPA filter in the home.    Review of systems otherwise negative other than that mentioned in the HPI.    Objective:   Blood pressure 130/80, pulse 67, temperature 98.2 F (36.8 C), temperature source Temporal, resp. rate 18, height 5' 1.42 (1.56 m), weight 181 lb 12.8 oz (82.5 kg), SpO2 95%. Body mass index is 33.89 kg/m.     Physical Exam Vitals reviewed.  Constitutional:      Appearance: She is well-developed.     Comments: Talks very fast.   HENT:     Head: Normocephalic and atraumatic.     Right Ear: Tympanic membrane, ear canal and external ear normal. No drainage, swelling or tenderness. Tympanic membrane is not injected, scarred, erythematous, retracted or  bulging.     Left Ear: Tympanic membrane, ear canal and external ear normal. No drainage, swelling or tenderness. Tympanic  membrane is not injected, scarred, erythematous, retracted or bulging.     Nose: No nasal deformity, septal deviation, mucosal edema or rhinorrhea.     Right Turbinates: Enlarged, swollen and pale.     Left Turbinates: Enlarged, swollen and pale.     Right Sinus: No maxillary sinus tenderness or frontal sinus tenderness.     Left Sinus: No maxillary sinus tenderness or frontal sinus tenderness.     Mouth/Throat:     Mouth: Mucous membranes are not pale and not dry.     Pharynx: Uvula midline.  Eyes:     General:        Right eye: No discharge.        Left eye: No discharge.     Conjunctiva/sclera: Conjunctivae normal.     Right eye: Right conjunctiva is not injected. No chemosis.    Left eye: Left conjunctiva is not injected. No chemosis.    Pupils: Pupils are equal, round, and reactive to light.  Cardiovascular:     Rate and Rhythm: Normal rate and regular rhythm.     Heart sounds: Normal heart sounds.  Pulmonary:     Effort: Pulmonary effort is normal. No tachypnea, accessory muscle usage or respiratory distress.     Breath sounds: Normal breath sounds. No wheezing, rhonchi or rales.  Chest:     Chest wall: No tenderness.  Abdominal:     Tenderness: There is no abdominal tenderness. There is no guarding or rebound.  Lymphadenopathy:     Head:     Right side of head: No submandibular, tonsillar or occipital adenopathy.     Left side of head: No submandibular, tonsillar or occipital adenopathy.     Cervical: No cervical adenopathy.  Skin:    Coloration: Skin is not pale.     Findings: No abrasion, erythema, petechiae or rash. Rash is not papular, urticarial or vesicular.  Neurological:     Mental Status: She is alert.  Psychiatric:        Behavior: Behavior is cooperative.      Diagnostic studies: labs sent instead but we will do additional testing at the next visit          Marty Shaggy, MD [facility] 

## 2024-08-27 NOTE — Patient Instructions (Addendum)
 1. Chronic rhinitis - Because of insurance stipulations, we cannot do skin testing on the same day as your first visit. - We are all working to fight this, but for now we need to do two separate visits.  - We will know more after we do testing at the next visit.  - The skin testing visit can be squeezed in at your convenience.  - Then we can make a more full plan to address all of your symptoms. - Be sure to stop your antihistamines for 3 days before this appointment.  - We are going to get the notes from your other allergy doctor to put some things together.   2. Seafood allergy - new onset - We will get some blood work to look at your IgE levels to seafood. - EpiPen sent in today.   3. Gastroesophageal reflux disease - Continue with your   4. Recurrent infections - We will obtain some screening labs to evaluate your immune system.  - Labs to evaluate the quantitative [facility]) aspects of your immune system: IgG/IgA/IgM, CBC with differential - Labs to evaluate the qualitative (HOW WELL THEY WORK) aspects of your immune system: CH50, Pneumococcal titers, Tetanus titers, Diphtheria titers - We may consider immunizations with Pneumovax and Tdap to challenge your immune system, and then obtain repeat titers in 4-6 weeks.   5. Return in about 2 weeks (around 09/10/2024) for SKIN TESTING (1-55 + SEAFOOD). You can have the follow up appointment with Dr. Iva or a Nurse Practicioner (our Nurse Practitioners are excellent and always have Physician oversight!).    Please inform us  of any Emergency Department visits, hospitalizations, or changes in symptoms. Call us  before going to the ED for breathing or allergy symptoms since we might be able to fit you in for a sick visit. Feel free to contact us  anytime with any questions, problems, or concerns.  It was a pleasure to see you again today!  Websites that have reliable patient information: 1. American Academy of Asthma, Allergy, and  Immunology: www.aaaai.org 2. Food Allergy Research and Education (FARE): foodallergy.org 3. Mothers of Asthmatics: http://www.asthmacommunitynetwork.org 4. American College of Allergy, Asthma, and Immunology: www.acaai.org      "Like" us  on Facebook and Instagram for our latest updates!      A healthy democracy works best when [employer] participate! Make sure you are registered to vote! If you have moved or changed any of your contact information, you will need to get this updated before voting! Scan the QR codes below to learn more!

## 2024-08-29 ENCOUNTER — Telehealth: Payer: Self-pay

## 2024-08-29 DIAGNOSIS — D696 Thrombocytopenia, unspecified: Secondary | ICD-10-CM | POA: Diagnosis not present

## 2024-08-29 DIAGNOSIS — E118 Type 2 diabetes mellitus with unspecified complications: Secondary | ICD-10-CM | POA: Diagnosis not present

## 2024-08-29 DIAGNOSIS — J309 Allergic rhinitis, unspecified: Secondary | ICD-10-CM | POA: Diagnosis not present

## 2024-08-29 DIAGNOSIS — M85859 Other specified disorders of bone density and structure, unspecified thigh: Secondary | ICD-10-CM | POA: Diagnosis not present

## 2024-08-29 DIAGNOSIS — F419 Anxiety disorder, unspecified: Secondary | ICD-10-CM | POA: Diagnosis not present

## 2024-08-29 DIAGNOSIS — N9089 Other specified noninflammatory disorders of vulva and perineum: Secondary | ICD-10-CM | POA: Diagnosis not present

## 2024-08-29 DIAGNOSIS — K219 Gastro-esophageal reflux disease without esophagitis: Secondary | ICD-10-CM | POA: Diagnosis not present

## 2024-08-29 DIAGNOSIS — E782 Mixed hyperlipidemia: Secondary | ICD-10-CM | POA: Diagnosis not present

## 2024-08-29 DIAGNOSIS — I10 Essential (primary) hypertension: Secondary | ICD-10-CM | POA: Diagnosis not present

## 2024-08-29 NOTE — Telephone Encounter (Signed)
 [facility] [place]. Records have been given to Dr.Gallagher for review.

## 2024-09-02 DIAGNOSIS — K760 Fatty (change of) liver, not elsewhere classified: Secondary | ICD-10-CM | POA: Diagnosis not present

## 2024-09-02 DIAGNOSIS — T7803XA Anaphylactic reaction due to other fish, initial encounter: Secondary | ICD-10-CM | POA: Diagnosis not present

## 2024-09-02 DIAGNOSIS — K625 Hemorrhage of anus and rectum: Secondary | ICD-10-CM | POA: Diagnosis not present

## 2024-09-02 DIAGNOSIS — B999 Unspecified infectious disease: Secondary | ICD-10-CM | POA: Diagnosis not present

## 2024-09-05 ENCOUNTER — Ambulatory Visit: Payer: Self-pay | Admitting: Allergy & Immunology

## 2024-09-05 ENCOUNTER — Encounter: Payer: Self-pay | Admitting: Allergy & Immunology

## 2024-09-05 ENCOUNTER — Ambulatory Visit: Admitting: Allergy & Immunology

## 2024-09-05 DIAGNOSIS — T7803XA Anaphylactic reaction due to other fish, initial encounter: Secondary | ICD-10-CM

## 2024-09-05 DIAGNOSIS — J302 Other seasonal allergic rhinitis: Secondary | ICD-10-CM

## 2024-09-05 DIAGNOSIS — T7803XD Anaphylactic reaction due to other fish, subsequent encounter: Secondary | ICD-10-CM

## 2024-09-05 DIAGNOSIS — K219 Gastro-esophageal reflux disease without esophagitis: Secondary | ICD-10-CM

## 2024-09-05 DIAGNOSIS — B999 Unspecified infectious disease: Secondary | ICD-10-CM

## 2024-09-05 DIAGNOSIS — J3089 Other allergic rhinitis: Secondary | ICD-10-CM

## 2024-09-05 LAB — STREP PNEUMONIAE 23 SEROTYPES IGG
Pneumo Ab Type 1*: 7.7 ug/mL (ref >1.3)
Pneumo Ab Type 12 (12F)*: 0.3 ug/mL — ABNORMAL LOW (ref >1.3)
Pneumo Ab Type 14*: 29.1 ug/mL (ref >1.3)
Pneumo Ab Type 17 (17F)*: 2.1 ug/mL (ref >1.3)
Pneumo Ab Type 19 (19F)*: 2 ug/mL (ref >1.3)
Pneumo Ab Type 2*: 7.4 ug/mL (ref >1.3)
Pneumo Ab Type 20*: 14.3 ug/mL (ref >1.3)
Pneumo Ab Type 22 (22F)*: 0.8 ug/mL — ABNORMAL LOW (ref >1.3)
Pneumo Ab Type 23 (23F)*: 4.3 ug/mL (ref >1.3)
Pneumo Ab Type 26 (6B)*: 3.6 ug/mL (ref >1.3)
Pneumo Ab Type 3*: 0.2 ug/mL — ABNORMAL LOW (ref >1.3)
Pneumo Ab Type 34 (10A)*: 0.3 ug/mL — ABNORMAL LOW (ref >1.3)
Pneumo Ab Type 4*: 1.2 ug/mL — ABNORMAL LOW (ref >1.3)
Pneumo Ab Type 43 (11A)*: 1.4 ug/mL (ref >1.3)
Pneumo Ab Type 5*: 0.4 ug/mL — ABNORMAL LOW (ref >1.3)
Pneumo Ab Type 51 (7F)*: 3.7 ug/mL (ref >1.3)
Pneumo Ab Type 54 (15B)*: 1.7 ug/mL (ref >1.3)
Pneumo Ab Type 56 (18C)*: 6 ug/mL (ref >1.3)
Pneumo Ab Type 57 (19A)*: 2.2 ug/mL (ref >1.3)
Pneumo Ab Type 68 (9V)*: 2.7 ug/mL (ref >1.3)
Pneumo Ab Type 70 (33F)*: 2.5 ug/mL (ref >1.3)
Pneumo Ab Type 8*: 13.1 ug/mL (ref >1.3)
Pneumo Ab Type 9 (9N)*: 3.8 ug/mL (ref >1.3)

## 2024-09-05 LAB — CBC WITH DIFF/PLATELET
Basophils Absolute: 0.1 x10E3/uL (ref 0.0–0.2)
Basos: 2 %
EOS (ABSOLUTE): 0.3 x10E3/uL (ref 0.0–0.4)
Eos: 6 %
Hematocrit: 46 % (ref 34.0–46.6)
Hemoglobin: 15.3 g/dL (ref 11.1–15.9)
Immature Grans (Abs): 0 x10E3/uL (ref 0.0–0.1)
Immature Granulocytes: 0 %
Lymphocytes Absolute: 1.4 x10E3/uL (ref 0.7–3.1)
Lymphs: 32 %
MCH: 30 pg (ref 26.6–33.0)
MCHC: 33.3 g/dL (ref 31.5–35.7)
MCV: 90 fL (ref 79–97)
Monocytes Absolute: 0.4 x10E3/uL (ref 0.1–0.9)
Monocytes: 8 %
Neutrophils Absolute: 2.3 x10E3/uL (ref 1.4–7.0)
Neutrophils: 52 %
Platelets: 98 x10E3/uL — CL (ref 150–450)
RBC: 5.1 x10E6/uL (ref 3.77–5.28)
RDW: 12 % (ref 11.7–15.4)
WBC: 4.4 x10E3/uL (ref 3.4–10.8)

## 2024-09-05 LAB — IGG, IGA, IGM
IgG (Immunoglobin G), Serum: 1273 mg/dL (ref 586–1602)
IgM (Immunoglobulin M), Srm: 52 mg/dL (ref 26–217)
Immunoglobulin A, (IgA) QN, Serum: 274 mg/dL (ref 87–352)

## 2024-09-05 LAB — ALLERGY PANEL 19, SEAFOOD GROUP
Allergen Salmon IgE: <0.1 kU/L
Catfish: <0.1 kU/L
Codfish IgE: <0.1 kU/L
F023-IgE Crab: <0.1 kU/L
F080-IgE Lobster: <0.1 kU/L
Shrimp IgE: <0.1 kU/L
Tuna: <0.1 kU/L

## 2024-09-05 LAB — TRYPTASE: Tryptase: 9.1 ug/L (ref 2.2–13.2)

## 2024-09-05 LAB — DIPHTHERIA / TETANUS ANTIBODY PANEL
Diphtheria Ab: 0.13 [IU]/mL (ref <0.10)
Tetanus Ab, IgG: <0.1 [IU]/mL — ABNORMAL LOW (ref <0.10)

## 2024-09-05 LAB — COMPLEMENT, TOTAL: Compl, Total (CH50): >60 U/mL (ref >41)

## 2024-09-05 NOTE — Progress Notes (Signed)
 FOLLOW UP  Date of Service/Encounter:  09/05/24   Assessment:   Perennial allergic rhinitis    Chronic rhinosinusitis - Augmentin  called in on 07/28/19   Gastroesophageal reflux disease - controlled on Dexliant and ranitidine   Recurrent infections - seems stable, has non-protective Tetanus    New onset reaction secondary to shrimp - rash (? blistering) although there are no pictures   Thrombocytopenia  Plan/Recommendations:   1. Chronic rhinitis - Testing today showed: grasses, weeds, indoor molds, outdoor molds, dust mites, cat, and cockroach - Copy of test results provided.  - Avoidance measures provided. - Continue with Singulair  (montelukast ) 10mg  daily. - Continue with Xyzal  (levocetirizine) 5mg  daily - Continue with Atrovent (ipratropium) 1 spray per nostril up to 3 times daily as needed. - Continue with Flonase  (fluticasone ) 1 spray per nostril up to twice daily as needed. - You can use an extra dose of the antihistamine, if needed, for breakthrough symptoms.  - Consider nasal saline rinses 1-2 times daily to remove allergens from the nasal cavities as well as help with mucous clearance (this is especially helpful to do before the nasal sprays are given) - Consider allergy  shots as a means of long-term control. - Allergy  shots re-train and reset the immune system to ignore environmental allergens and decrease the resulting immune response to those allergens (sneezing, itchy watery eyes, runny nose, nasal congestion, etc).    - Allergy  shots improve symptoms in 75-85% of patients.  - We can discuss more at the next appointment if the medications are not working for you.  2. Seafood allergy  - new onset - Testing was negative to seafood on skin testing (blood work is still pending).  - EpiPen  is up to date.   3. Gastroesophageal reflux disease - Continue with your famotidine  20mg  1-2 times daily.   4. Recurrent infections - Your labs looked fairly good, aside  from low platelets.  - This seems to be your normal level.   5. Return in about 6 months (around 03/05/2025). You can have the follow up appointment with Dr. Iva or a Nurse Practicioner (our Nurse Practitioners are excellent and always have Physician oversight!).   Subjective:   Barbara House is a 68 y.o. 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   Lower abdominal pain 04/21/2021   Gastroparesis 04/21/2021   Chronic pain 03/28/2021   Chronic sinusitis 03/28/2021   Hammer toe 03/28/2021   Morbid obesity (HCC) 03/28/2021   Pure hypercholesterolemia 03/28/2021   Solitary pulmonary nodule 03/28/2021   Vitamin D  deficiency 03/28/2021   Left flank pain 01/07/2021   Left-sided back pain 01/07/2021   Bloating 01/07/2021   LUQ pain 04/27/2020   Early satiety 03/02/2020   Thrombocytopenia 08/29/2019   AKI (acute kidney injury) 07/31/2019   Gastritis without peptic ulcer disease    Esophageal dysphagia 08/09/2018   Encounter for screening colonoscopy 08/09/2018   Hyperlipidemia LDL goal <70 10/12/2017   Anxiety disorder    Type 2 diabetes mellitus (HCC)  Hypertension    Allergic rhinitis    Angina pectoris 08/03/2016   Abnormal nuclear stress test 08/03/2016   Abnormal myocardial perfusion study    Rhinitis 06/25/2015   Gastroesophageal reflux disease 06/25/2015   Diabetes mellitus (HCC) 06/25/2015    History obtained from: chart review and patient.  Discussed the use of AI scribe software for clinical note transcription with  the patient and/or guardian, who gave verbal consent to proceed.  Barbara House is a 68 y.o. female presenting for skin testing. She was last seen on November 12th. We could not do testing because her insurance company does not cover testing on the same day as a New Patient visit. She has been off of all antihistamines 3 days in anticipation of the testing.   REVIEW OF OUTSIDE RECORDS: We did receive her outside medical records.  It looks like her last visit with her other allergist in [facility] was October 2025.  At that time, she had a workup due to her recent onset of a seafood allergy .  In September, she ate shrimp and had hives and everything was dark.  She also was weak and dizzy.  This was 30 minutes after eating shrimp.  In August, she had some shrimp and lobster dip and had a similar reaction.  She did not have an EpiPen  at that time.  She had testing that was negative for shellfish and codfish.  She remains on ipratropium daily which helps with rhinorrhea and Singulair  daily.  She has stopped the Flonase .  It looks like her Flonase  was restarted.  She had reported that the Flonase  worked, but she did not realize she could use bothtogether.  She was prescribed an EpiPen  again at that time.  She has a history of allergic reactions to seafood, specifically shrimp, with the last episode occurring in mid-September. After consuming three jumbo shrimp, she experienced significant symptoms including itching, hives, and swelling, particularly on her arms and legs. She also noted a sensation of 'water  pumps' on her legs and difficulty sitting due to discomfort. She has been avoiding seafood since this incident.  Approximately two years ago, she underwent allergy  testing with a previous allergist in [place] but does not recall the specifics of the tests conducted at that time.  Her current medications include Singulair  (montelukast ) and another medication starting with 'L', possibly levocetirizine, which she uses  as needed, primarily in the mornings. She also uses Flonase  in conjunction with another nasal spray, possibly ipratropium, to manage symptoms of post-nasal drip and coughing.  She has a history of low platelet counts, which have been monitored by Dr. Lahoma. She recalls receiving treatment for this condition last year, but on further discussion it seems that this was IV iron.   No issues with red meat consumption. Reports itching and hives following shrimp consumption. No current issues with other foods.  Otherwise, there have been no changes to her past medical history, surgical history, family history, or social history.    Review of systems otherwise negative other than that mentioned in the HPI.    Objective:   There were no vitals taken for this visit. There is no height or weight on file to calculate BMI.    Physical exam deferred since this was a skin testing appointment only.   Diagnostic studies:   Allergy  Studies:     Airborne Adult Perc - 09/05/24 0953     Time Antigen Placed 9046    Allergen Manufacturer Jestine    Location Back  Number of Test 55    Panel 1 Select    1. Control-Buffer 50% Glycerol Negative    2. Control-Histamine 3+    3. Bahia Negative    4. Bermuda Negative    5. Johnson Negative    6. [place]  Blue Negative    7. Meadow Fescue Negative    8. Perennial Rye Negative    9. Timothy Negative    10. Ragweed Mix Negative    11. Cocklebur Negative    12. Plantain,  English Negative    13. Baccharis Negative    14. Dog Fennel Negative    15. Russian Thistle Negative    16. Lamb's Quarters Negative    17. Sheep Sorrell Negative    18. Rough Pigweed Negative    19. Marsh Elder, Rough Negative    20. Mugwort, Common Negative    21. Box, Elder Negative    22. Cedar, red Negative    23. Sweet Gum Negative    24. Pecan Pollen Negative    25. Pine Mix Negative    26. Walnut, Black Pollen Negative    27. Red Mulberry Negative    28. Ash  Mix Negative    29. Birch Mix Negative    30. Beech American Negative    31. Cottonwood, Eastern Negative    32. Hickory, White Negative    33. Maple Mix Negative    34. Oak, Eastern Mix Negative    35. Sycamore Eastern Negative    36. Alternaria Alternata Negative    37. Cladosporium Herbarum Negative    38. Aspergillus Mix Negative    39. Penicillium Mix Negative    40. Bipolaris Sorokiniana (Helminthosporium) Negative    41. Drechslera Spicifera (Curvularia) Negative    42. Mucor Plumbeus Negative    43. Fusarium Moniliforme Negative    44. Aureobasidium Pullulans (pullulara) Negative    45. Rhizopus Oryzae Negative    46. Botrytis Cinera Negative    47. Epicoccum Nigrum Negative    48. Phoma Betae Negative    49. Dust Mite Mix Negative    50. Cat Hair 10,000 BAU/ml Negative    51.  Dog Epithelia Negative    52. Mixed Feathers Negative    53. Horse Epithelia Negative    54. Cockroach, German Negative    55. Tobacco Leaf Negative          Intradermal - 09/05/24 1100     Time Antigen Placed 1046    Allergen Manufacturer Greer    Location Arm    Number of Test 16    Control Negative    Bahia 2+    Bermuda 2+    Johnson 2+    7 Grass 2+    Ragweed Mix Negative    Weed Mix 2+    Tree Mix Negative    Mold 1 2+    Mold 2 3+    Mold 3 Negative    Mold 4 Negative    Mite Mix 2+    Cat 2+    Dog Negative    Cockroach 2+          Food Adult Perc - 09/05/24 0900     Time Antigen Placed 9045    Allergen Manufacturer Jestine    Location Back     Control-buffer 50% Glycerol Negative    Control-Histamine 3+    8. Shellfish Mix Negative    9. Fish Mix Negative    18. Trout Negative    19.  Tuna Negative    20. Salmon Negative    21. Flounder Negative    22. Codfish Negative    23. Shrimp Negative    24. Crab Negative    25. Lobster Negative    26. Oyster Negative    27. Scallops Negative          Allergy  testing results were read and interpreted by  myself, documented by clinical staff.      Marty Shaggy, MD  [facility] 

## 2024-09-05 NOTE — Patient Instructions (Signed)
 1. Chronic rhinitis - Testing today showed: grasses, weeds, indoor molds, outdoor molds, dust mites, cat, and cockroach - Copy of test results provided.  - Avoidance measures provided. - Continue with Singulair  (montelukast ) 10mg  daily. - Continue with Xyzal  (levocetirizine) 5mg  daily - Continue with Atrovent (ipratropium) 1 spray per nostril up to 3 times daily as needed. - Continue with Flonase  (fluticasone ) 1 spray per nostril up to twice daily as needed. - You can use an extra dose of the antihistamine, if needed, for breakthrough symptoms.  - Consider nasal saline rinses 1-2 times daily to remove allergens from the nasal cavities as well as help with mucous clearance (this is especially helpful to do before the nasal sprays are given) - Consider allergy  shots as a means of long-term control. - Allergy  shots re-train and reset the immune system to ignore environmental allergens and decrease the resulting immune response to those allergens (sneezing, itchy watery eyes, runny nose, nasal congestion, etc).    - Allergy  shots improve symptoms in 75-85% of patients.  - We can discuss more at the next appointment if the medications are not working for you.  2. Seafood allergy  - new onset - Testing was negative to seafood on skin testing (blood work is still pending).  - EpiPen  is up to date.   3. Gastroesophageal reflux disease - Continue with your famotidine  20mg  1-2 times daily.   4. Recurrent infections - Your labs looked fairly good, aside from low platelets.  - This seems to be your normal level.   5. Return in about 6 months (around 03/05/2025). You can have the follow up appointment with Dr. Iva or a Nurse Practicioner (our Nurse Practitioners are excellent and always have Physician oversight!).    Please inform us  of any Emergency Department visits, hospitalizations, or changes in symptoms. Call us  before going to the ED for breathing or allergy  symptoms since we might be  able to fit you in for a sick visit. Feel free to contact us  anytime with any questions, problems, or concerns.  It was a pleasure to see you again today!  Websites that have reliable patient information: 1. American Academy of Asthma, Allergy , and Immunology: www.aaaai.org 2. Food Allergy  Research and Education (FARE): foodallergy.org 3. Mothers of Asthmatics: http://www.asthmacommunitynetwork.org 4. American College of Allergy , Asthma, and Immunology: www.acaai.org      "Like" us  on Facebook and Instagram for our latest updates!      A healthy democracy works best when [employer] participate! Make sure you are registered to vote! If you have moved or changed any of your contact information, you will need to get this updated before voting! Scan the QR codes below to learn more!       Airborne Adult Perc - 09/05/24 0953     Time Antigen Placed 9046    Allergen Manufacturer Jestine    Location Back    Number of Test 55    Panel 1 Select    1. Control-Buffer 50% Glycerol Negative    2. Control-Histamine 3+    3. Bahia Negative    4. Bermuda Negative    5. Johnson Negative    6. Kentucky  Blue Negative    7. Meadow Fescue Negative    8. Perennial Rye Negative    9. Timothy Negative    10. Ragweed Mix Negative    11. Cocklebur Negative    12. Plantain,  English Negative    13. Baccharis Negative    14. Dog Fennel Negative  15. Russian Thistle Negative    16. Lamb's Quarters Negative    17. Sheep Sorrell Negative    18. Rough Pigweed Negative    19. Marsh Elder, Rough Negative    20. Mugwort, Common Negative    21. Box, Elder Negative    22. Cedar, red Negative    23. Sweet Gum Negative    24. Pecan Pollen Negative    25. Pine Mix Negative    26. Walnut, Black Pollen Negative    27. Red Mulberry Negative    28. Ash Mix Negative    29. Birch Mix Negative    30. Beech American Negative    31. Cottonwood, Eastern Negative    32. Hickory, White Negative    33.  Maple Mix Negative    34. Oak, Eastern Mix Negative    35. Sycamore Eastern Negative    36. Alternaria Alternata Negative    37. Cladosporium Herbarum Negative    38. Aspergillus Mix Negative    39. Penicillium Mix Negative    40. Bipolaris Sorokiniana (Helminthosporium) Negative    41. Drechslera Spicifera (Curvularia) Negative    42. Mucor Plumbeus Negative    43. Fusarium Moniliforme Negative    44. Aureobasidium Pullulans (pullulara) Negative    45. Rhizopus Oryzae Negative    46. Botrytis Cinera Negative    47. Epicoccum Nigrum Negative    48. Phoma Betae Negative    49. Dust Mite Mix Negative    50. Cat Hair 10,000 BAU/ml Negative    51.  Dog Epithelia Negative    52. Mixed Feathers Negative    53. Horse Epithelia Negative    54. Cockroach, German Negative    55. Tobacco Leaf Negative          Intradermal - 09/05/24 1100     Time Antigen Placed 1046    Allergen Manufacturer Greer    Location Arm    Number of Test 16    Control Negative    Bahia 2+    Bermuda 2+    Johnson 2+    7 Grass 2+    Ragweed Mix Negative    Weed Mix 2+    Tree Mix Negative    Mold 1 2+    Mold 2 3+    Mold 3 Negative    Mold 4 Negative    Mite Mix 2+    Cat 2+    Dog Negative    Cockroach 2+          Food Adult Perc - 09/05/24 0900     Time Antigen Placed 9045    Allergen Manufacturer Jestine    Location Back     Control-buffer 50% Glycerol Negative    Control-Histamine 3+    8. Shellfish Mix Negative    9. Fish Mix Negative    18. Trout Negative    19. Tuna Negative    20. Salmon Negative    21. Flounder Negative    22. Codfish Negative    23. Shrimp Negative    24. Crab Negative    25. Lobster Negative    26. Oyster Negative    27. Scallops Negative          Reducing Pollen Exposure  The American Academy of Allergy , Asthma and Immunology suggests the following steps to reduce your exposure to pollen during allergy  seasons.    Do not hang sheets or clothing  out to dry; pollen may collect on these items. Do not mow  lawns or spend time around freshly cut grass; mowing stirs up pollen. Keep windows closed at night.  Keep car windows closed while driving. Minimize morning activities outdoors, a time when pollen counts are usually at their highest. Stay indoors as much as possible when pollen counts or humidity is high and on windy days when pollen tends to remain in the air longer. Use air conditioning when possible.  Many air conditioners have filters that trap the pollen spores. Use a HEPA room air filter to remove pollen form the indoor air you breathe.  Control of Mold Allergen   Mold and fungi can grow on a variety of surfaces provided certain temperature and moisture conditions exist.  Outdoor molds grow on plants, decaying vegetation and soil.  The major outdoor mold, Alternaria and Cladosporium, are found in very high numbers during hot and dry conditions.  Generally, a late Summer - Fall peak is seen for common outdoor fungal spores.  Rain will temporarily lower outdoor mold spore count, but counts rise rapidly when the rainy period ends.  The most important indoor molds are Aspergillus and Penicillium.  Dark, humid and poorly ventilated basements are ideal sites for mold growth.  The next most common sites of mold growth are the bathroom and the kitchen.  Outdoor (Seasonal) Mold Control   Use air conditioning and keep windows closed Avoid exposure to decaying vegetation. Avoid leaf raking. Avoid grain handling. Consider wearing a face mask if working in moldy areas.   Indoor (Perennial) Mold Control    Maintain humidity below 50%. Clean washable surfaces with 5% bleach solution. Remove sources e.g. contaminated carpets.    Control of Cockroach Allergen  Cockroach allergen has been identified as an important cause of acute attacks of asthma, especially in urban settings.  There are fifty-five species of cockroach that exist in the  [place] , however only three, the American, German and Oriental species produce allergen that can affect patients with Asthma.  Allergens can be obtained from fecal particles, egg casings and secretions from cockroaches.    Remove food sources. Reduce access to water . Seal access and entry points. Spray runways with 0.5-1% Diazinon or Chlorpyrifos Blow boric acid power under stoves and refrigerator. Place bait stations (hydramethylnon) at feeding sites.  Control of Dog or Cat Allergen  Avoidance is the best way to manage a dog or cat allergy . If you have a dog or cat and are allergic to dog or cats, consider removing the dog or cat from the home. If you have a dog or cat but don't want to find it a new home, or if your family wants a pet even though someone in the household is allergic, here are some strategies that may help keep symptoms at bay:  Keep the pet out of your bedroom and restrict it to only a few rooms. Be advised that keeping the dog or cat in only one room will not limit the allergens to that room. Don't pet, hug or kiss the dog or cat; if you do, wash your hands with soap and water . High-efficiency particulate air (HEPA) cleaners run continuously in a bedroom or living room can reduce allergen levels over time. Regular use of a high-efficiency vacuum cleaner or a central vacuum can reduce allergen levels. Giving your dog or cat a bath at least once a week can reduce airborne allergen.  Control of Dust Mite Allergen    Dust mites play a major role in allergic asthma and rhinitis.  They occur  in environments with high humidity wherever human skin is found.  Dust mites absorb humidity from the atmosphere (ie, they do not drink) and feed on organic matter (including shed human and animal skin).  Dust mites are a microscopic type of insect that you cannot see with the naked eye.  High levels of dust mites have been detected from mattresses, pillows, carpets, upholstered  furniture, bed covers, clothes, soft toys and any woven material.  The principal allergen of the dust mite is found in its feces.  A gram of dust may contain 1,000 mites and 250,000 fecal particles.  Mite antigen is easily measured in the air during house cleaning activities.  Dust mites do not bite and do not cause harm to humans, other than by triggering allergies/asthma.    Ways to decrease your exposure to dust mites in your home:  Encase mattresses, box springs and pillows with a mite-impermeable barrier or cover   Wash sheets, blankets and drapes weekly in hot water  (130 F) with detergent and dry them in a dryer on the hot setting.  Have the room cleaned frequently with a vacuum cleaner and a damp dust-mop.  For carpeting or rugs, vacuuming with a vacuum cleaner equipped with a high-efficiency particulate air (HEPA) filter.  The dust mite allergic individual should not be in a room which is being cleaned and should wait 1 hour after cleaning before going into the room. Do not sleep on upholstered furniture (eg, couches).   If possible removing carpeting, upholstered furniture and drapery from the home is ideal.  Horizontal blinds should be eliminated in the rooms where the person spends the most time (bedroom, study, television room).  Washable vinyl, roller-type shades are optimal. Remove all non-washable stuffed toys from the bedroom.  Wash stuffed toys weekly like sheets and blankets above.   Reduce indoor humidity to less than 50%.  Inexpensive humidity monitors can be purchased at most hardware stores.  Do not use a humidifier as can make the problem worse and are not recommended.

## 2024-09-07 LAB — CBC WITH DIFFERENTIAL/PLATELET
Basophils Absolute: 0.1 x10E3/uL (ref 0.0–0.2)
Basos: 2 %
EOS (ABSOLUTE): 0.3 x10E3/uL (ref 0.0–0.4)
Eos: 7 %
Hematocrit: 46.4 % (ref 34.0–46.6)
Hemoglobin: 15.1 g/dL (ref 11.1–15.9)
Immature Grans (Abs): 0 x10E3/uL (ref 0.0–0.1)
Immature Granulocytes: 0 %
Lymphocytes Absolute: 1.4 x10E3/uL (ref 0.7–3.1)
Lymphs: 31 %
MCH: 29.5 pg (ref 26.6–33.0)
MCHC: 32.5 g/dL (ref 31.5–35.7)
MCV: 91 fL (ref 79–97)
Monocytes Absolute: 0.3 x10E3/uL (ref 0.1–0.9)
Monocytes: 7 %
Neutrophils Absolute: 2.4 x10E3/uL (ref 1.4–7.0)
Neutrophils: 53 %
Platelets: 101 x10E3/uL — ABNORMAL LOW (ref 150–450)
RBC: 5.11 x10E6/uL (ref 3.77–5.28)
RDW: 11.8 % (ref 11.7–15.4)
WBC: 4.5 x10E3/uL (ref 3.4–10.8)

## 2024-09-07 LAB — COMPREHENSIVE METABOLIC PANEL WITH GFR
ALT: 27 IU/L (ref 0–32)
AST: 40 IU/L (ref 0–40)
Albumin: 4.9 g/dL (ref 3.9–4.9)
Alkaline Phosphatase: 92 IU/L (ref 49–135)
BUN/Creatinine Ratio: 23 (ref 12–28)
BUN: 31 mg/dL — ABNORMAL HIGH (ref 8–27)
Bilirubin Total: 0.4 mg/dL (ref 0.0–1.2)
CO2: 25 mmol/L (ref 20–29)
Calcium: 10.9 mg/dL — ABNORMAL HIGH (ref 8.7–10.3)
Chloride: 99 mmol/L (ref 96–106)
Creatinine, Ser: 1.34 mg/dL — ABNORMAL HIGH (ref 0.57–1.00)
Globulin, Total: 3.1 g/dL (ref 1.5–4.5)
Glucose: 116 mg/dL — ABNORMAL HIGH (ref 70–99)
Potassium: 4.2 mmol/L (ref 3.5–5.2)
Sodium: 141 mmol/L (ref 134–144)
Total Protein: 8 g/dL (ref 6.0–8.5)
eGFR: 44 mL/min/1.73 — ABNORMAL LOW

## 2024-09-07 LAB — PROTIME-INR
INR: 1 (ref 0.9–1.2)
Prothrombin Time: 10.7 s (ref 9.1–12.0)

## 2024-09-07 LAB — ENHANCED LIVER FIBROSIS (ELF): ELF(TM) Score: 9.53 (ref <9.80)

## 2024-09-07 LAB — LIPASE: Lipase: 50 U/L (ref 14–72)

## 2024-09-08 DIAGNOSIS — N898 Other specified noninflammatory disorders of vagina: Secondary | ICD-10-CM | POA: Diagnosis not present

## 2024-09-08 DIAGNOSIS — N9089 Other specified noninflammatory disorders of vulva and perineum: Secondary | ICD-10-CM | POA: Diagnosis not present

## 2024-09-17 ENCOUNTER — Inpatient Hospital Stay: Attending: Physician Assistant

## 2024-09-17 DIAGNOSIS — Z90710 Acquired absence of both cervix and uterus: Secondary | ICD-10-CM | POA: Insufficient documentation

## 2024-09-17 DIAGNOSIS — Z809 Family history of malignant neoplasm, unspecified: Secondary | ICD-10-CM | POA: Diagnosis not present

## 2024-09-17 DIAGNOSIS — D509 Iron deficiency anemia, unspecified: Secondary | ICD-10-CM

## 2024-09-17 DIAGNOSIS — E611 Iron deficiency: Secondary | ICD-10-CM | POA: Insufficient documentation

## 2024-09-17 DIAGNOSIS — E538 Deficiency of other specified B group vitamins: Secondary | ICD-10-CM | POA: Insufficient documentation

## 2024-09-17 DIAGNOSIS — Z79899 Other long term (current) drug therapy: Secondary | ICD-10-CM | POA: Insufficient documentation

## 2024-09-17 DIAGNOSIS — D696 Thrombocytopenia, unspecified: Secondary | ICD-10-CM | POA: Diagnosis present

## 2024-09-17 DIAGNOSIS — E559 Vitamin D deficiency, unspecified: Secondary | ICD-10-CM | POA: Diagnosis not present

## 2024-09-17 LAB — CBC WITH DIFFERENTIAL/PLATELET
Abs Immature Granulocytes: 0.02 K/uL (ref 0.00–0.07)
Basophils Absolute: 0 K/uL (ref 0.0–0.1)
Basophils Relative: 1 %
Eosinophils Absolute: 0.3 K/uL (ref 0.0–0.5)
Eosinophils Relative: 7 %
HCT: 43.5 % (ref 36.0–46.0)
Hemoglobin: 13.9 g/dL (ref 12.0–15.0)
Immature Granulocytes: 0 %
Lymphocytes Relative: 23 %
Lymphs Abs: 1.2 K/uL (ref 0.7–4.0)
MCH: 29.3 pg (ref 26.0–34.0)
MCHC: 32 g/dL (ref 30.0–36.0)
MCV: 91.8 fL (ref 80.0–100.0)
Monocytes Absolute: 0.5 K/uL (ref 0.1–1.0)
Monocytes Relative: 9 %
Neutro Abs: 3 K/uL (ref 1.7–7.7)
Neutrophils Relative %: 60 %
Platelets: 88 K/uL — ABNORMAL LOW (ref 150–400)
RBC: 4.74 MIL/uL (ref 3.87–5.11)
RDW: 12.9 % (ref 11.5–15.5)
WBC: 5.1 K/uL (ref 4.0–10.5)
nRBC: 0 % (ref 0.0–0.2)

## 2024-09-17 LAB — FERRITIN: Ferritin: 204 ng/mL (ref 11–307)

## 2024-09-17 LAB — IRON AND TIBC
Iron: 47 ug/dL (ref 28–170)
Saturation Ratios: 12 % (ref 10.4–31.8)
TIBC: 406 ug/dL (ref 250–450)
UIBC: 359 ug/dL

## 2024-09-18 DIAGNOSIS — Z01419 Encounter for gynecological examination (general) (routine) without abnormal findings: Secondary | ICD-10-CM | POA: Diagnosis not present

## 2024-09-18 DIAGNOSIS — L292 Pruritus vulvae: Secondary | ICD-10-CM | POA: Diagnosis not present

## 2024-09-18 DIAGNOSIS — N898 Other specified noninflammatory disorders of vagina: Secondary | ICD-10-CM | POA: Diagnosis not present

## 2024-09-18 DIAGNOSIS — L280 Lichen simplex chronicus: Secondary | ICD-10-CM | POA: Diagnosis not present

## 2024-09-18 DIAGNOSIS — N907 Vulvar cyst: Secondary | ICD-10-CM | POA: Diagnosis not present

## 2024-09-23 NOTE — Progress Notes (Unsigned)
 [facility] [address], [place] [address]   CLINIC:  Medical Oncology/Hematology  PCP:  Rolinda Millman, MD [phone] [address] [place] [address] [phone]   REASON FOR VISIT:  Follow-up for thrombocytopenia and iron deficiency   PRIOR THERAPY: Oral iron   CURRENT THERAPY: Intermittent IV iron   INTERVAL HISTORY:   Barbara House 68 y.o. female returns for routine follow-up of thrombocytopenia and iron deficiency.  She was last seen by Pleasant Barefoot PA-C on 03/12/2024.   At today's visit, she reports feeling ***. ***No recent hospitalizations, surgeries, or changes in baseline health status.    ***She has 100***% energy and 100***% appetite.  ***She endorses that she is maintaining a stable weight. ***She has not noticed any bright red blood per rectum or melena.   ***She does note occasional scant epistaxis when she blows her nose, but only when her sinuses are real bad.   ***She has occasional headaches related to her sinus issues. ***She denies any fatigue, ice pica, chest pain, dyspnea on exertion, lightheadedness, or syncope.    ***She reports easy bruising, but denies any petechial rash.    ***No B symptoms such as fever, chills, night sweats. ***She continues to take B12 and iron tablet daily, plus vitamin D  5000 units daily.  ASSESSMENT & PLAN:  1.  Thrombocytopenia, mild to moderate - Initial work-up unable to determine cause of thrombocytopenia - SPEP was normal, hepatitis panel was normal, RF and ANA were negative, no overt nutritional deficiencies (normal B12, folate, methylmalonic acid, copper ).  Previous testing of hepatitis C, HIV, and H. pylori were negative. - Pathologist smear review on 08/29/2019 showed mild thrombocytopenia, borderline anemia - Normal abdominal ultrasound from 08/15/2017 negative for liver abnormality, spleen was normal in size and appearance - CT abdomen/pelvis (08/17/2021): Normal liver and  spleen - Reported having one blood transfusion many years ago, but unsure of reason - Patient is taking vitamin B12 pills - Platelets have ranged from 80-109 since January 2020*** - No major bleeding events, no B symptoms  *** - No abnormal bruising or petechial rash  *** - Most recent labs (09/17/2024): Platelets 88, at baseline.  Otherwise normal CBC.  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 - RTC in 6 months for repeat labs and follow-up.   - Will consider trial of steroids and/or bone marrow biopsy if any significant changes in CBC.   2.  Normocytic anemia (resolved) with iron deficiency state # Vitamin B12 deficiency - Patient has had prior history of mild normocytic anemia with hemoglobin ranging from 11-12, possibly secondary to CKD and functional iron deficiency - EGD (05/17/2021): No evidence of bleeding, erosions, ulcerations, or varices - Colonoscopy (11/26/2023): Polyp x1 (hyperplastic polyp), nonbleeding internal hemorrhoids - Currently taking daily B12 tablet daily and iron tablet daily.*** - Received IV Feraheme  x2 in December 2022 - Energy improved after IV iron*** - No bright red blood per rectum or melena*** - Most recent labs (09/17/2024): Normal Hgb 13.9/MCV 91.8, ferritin 204, iron saturation 12% with normal TIBC.  (B12 and MMA were normal when checked in May 2025). - PLAN:  No indication for IV iron at this time. - RTC in 6 months for follow-up visit and repeat CBC and iron panel.  (Check B12/MMA annually, next due May 2026)   3.  Vitamin D   deficiency - Hypovitaminosis D evidenced in August 2021 with vitamin D  level 23.76  - Currently taking Vitamin D3 cholecalciferol 5,000 units daily*** - Most recent vitamin D  (03/03/2024) is normal at 47.6 - PLAN: Continue vitamin D  supplementation.  Check vitamin  D in 1 year (~May 2026)  PLAN SUMMARY: *** >> Labs in 6 months = CBC/D, ferritin, iron/TIBC, vitamin B12, MMA, vitamin D  >> OFFICE visit in 6 months (1 week after labs)     REVIEW OF SYSTEMS: ***  Review of Systems  Constitutional:  Negative for appetite change, chills, diaphoresis, fatigue, fever and unexpected weight change.  HENT:   Negative for lump/mass and nosebleeds.   Eyes:  Negative for eye problems.  Respiratory:  Negative for cough, hemoptysis and shortness of breath.   Cardiovascular:  Negative for chest pain, leg swelling and palpitations.  Gastrointestinal:  Negative for abdominal pain, blood in stool, constipation, diarrhea, nausea and vomiting.  Genitourinary:  Negative for hematuria.   Skin: Negative.   Neurological:  Positive for dizziness and headaches. Negative for light-headedness.  Hematological:  Does not bruise/bleed easily.     PHYSICAL EXAM:***  ECOG PERFORMANCE STATUS: 0 - Asymptomatic  There were no vitals filed for this visit.  There were no vitals filed for this visit.  Physical Exam Constitutional:      Appearance: Normal appearance. She is obese.  Cardiovascular:     Heart sounds: Normal heart sounds.  Pulmonary:     Breath sounds: Normal breath sounds.  Neurological:     General: No focal deficit present.     Mental Status: Mental status is at baseline.  Psychiatric:        Behavior: Behavior normal. Behavior is cooperative.     PAST MEDICAL/SURGICAL HISTORY:  Past Medical History:  Diagnosis Date   Allergic rhinitis    Anxiety disorder    Arthritis    Gastroparesis    GERD (gastroesophageal reflux disease)    Headache    History of migraine    Hx of adenomatous colonic polyps    Hypertension    Iron deficiency anemia 09/22/2021   Thrombocytopenia    Type 2 diabetes mellitus (HCC)    Not sure when she was diagnosed, greater than five years.    Past Surgical History:  Procedure Laterality Date   ABDOMINAL HYSTERECTOMY      has ovaries.    BALLOON DILATION N/A 05/17/2021   Procedure: BALLOON DILATION;  Surgeon: Cindie Carlin POUR, DO;  [facility];  Service: Endoscopy;  Laterality: N/A;   BIOPSY  01/31/2021   Procedure: BIOPSY;  Surgeon: Cindie Carlin POUR, DO;  [facility]: [facility];  Service: Endoscopy;;   CARDIAC CATHETERIZATION N/A 08/03/2016   Procedure: Left Heart Cath and Coronary Angiography;  Surgeon: Peter M Jordan, MD;  Location: [facility] INVASIVE CV LAB;  Service: Cardiovascular;  Laterality: N/A;   COLONOSCOPY WITH PROPOFOL  N/A 11/05/2018   Dr. Harvey: int/ext hemorrhoids, 3mm cecal tubular adenoma removed, next TCS in 5-10 years   COLONOSCOPY WITH PROPOFOL  N/A 11/26/2023   Procedure: COLONOSCOPY WITH PROPOFOL ;  Surgeon: Cindie Carlin POUR, DO;  [facility]: [facility];  Service: Endoscopy;  Laterality: N/A;  8:45 am, asa 3   ESOPHAGOGASTRODUODENOSCOPY (EGD) WITH PROPOFOL  N/A 11/05/2018   Dr. Harvey: esophagus was dilated, mild gastritis with benign bx   ESOPHAGOGASTRODUODENOSCOPY (EGD) WITH PROPOFOL  N/A 01/31/2021   Surgeon: Cindie Carlin POUR, DO;  Normal esophagus, large amount of food residue in the stomach, gastritis biopsied (mild chronic gastritis, negative  for H. pylori), normal examined duodenum s/p biopsy (benign).   ESOPHAGOGASTRODUODENOSCOPY (EGD) WITH PROPOFOL  N/A 05/17/2021   Surgeon: Cindie Carlin POUR, DO; normal esophagus s/p empiric dilation with mild resistance at 20 mm due to possible esophageal web, normal examined stomach and duodenum, small hiatal hernia.   Miinor skin surgery     POLYPECTOMY  11/05/2018   Procedure: POLYPECTOMY;  Surgeon: Harvey Margo CROME, MD;  [facility];  Service: Endoscopy;;  cecal polyp   POLYPECTOMY  11/26/2023   Procedure: POLYPECTOMY;  Surgeon: Cindie Carlin POUR, DO;  [facility];  Service: Endoscopy;;   SAVORY DILATION N/A 11/05/2018   Procedure: SAVORY DILATION;  Surgeon: Harvey Margo CROME, MD;  [facility];   Service: Endoscopy;  Laterality: N/A;    SOCIAL HISTORY:  Social History   Socioeconomic History   Marital status: Single    Spouse name: Not on file   Number of children: 1   Years of education: Not on file   Highest education level: Not on file  Occupational History   Not on file  Tobacco Use   Smoking status: Never   Smokeless tobacco: Never  Vaping Use   Vaping status: Never Used  Substance and Sexual Activity   Alcohol use: No    Alcohol/week: 0.0 standard drinks of alcohol   Drug use: No   Sexual activity: Not Currently    Birth control/protection: Surgical  Other Topics Concern   Not on file  Social History Narrative   Reports is on disability. Has one son. Lives in [place] . Enjoys time with family. Enjoys archivist. Attends church.    Social Drivers of Corporate Investment Banker Strain: Low Risk  (01/17/2021)   Overall Financial Resource Strain (CARDIA)    Difficulty of Paying Living Expenses: Not very hard  Food Insecurity: No Food Insecurity (05/20/2024)   Received from [facility]   Hunger Vital Sign    Within the past 12 months, you worried that your food would run out before you got the money to buy more.: Never true    Within the past 12 months, the food you bought just didn't last and you didn't have money to get more.: Never true  Transportation Needs: No Transportation Needs (05/20/2024)   Received from [facility] - Transportation    Lack of Transportation (Medical): No    Lack of Transportation (Non-Medical): No  Physical Activity: Insufficiently Active (08/14/2023)   Received from [facility]   Exercise Vital Sign    On average, how many days per week do you engage in moderate to strenuous exercise (like a brisk walk)?: 4 days    On average, how many minutes do you engage in exercise at this level?: 20 min  Stress: No Stress Concern Present (08/11/2021)   Received from [facility] -  Occupational Stress Questionnaire    Feeling of Stress : Only a little  Social Connections: Moderately Isolated (01/17/2021)   Social Connection and Isolation Panel    Frequency of Communication with Friends and Family: More than three times a week    Frequency of Social Gatherings with Friends and Family: More than three times a week    Attends Religious Services: More than 4 times per year    Active Member of [employer] or Organizations: No    Attends Banker Meetings: Not on file    Marital Status: Never married  Intimate Partner Violence: Not At Risk (08/14/2023)   Received from [facility]   Humiliation, Afraid, Rape, and Kick questionnaire    Within the last year, have you been afraid of your partner or ex-partner?: No    Within the last year, have you been humiliated or emotionally abused in other ways by your partner or ex-partner?: No    Within the last year, have you been kicked, hit, slapped, or otherwise physically hurt by your partner or ex-partner?: No    Within the last year, have you been raped or forced to have any kind of sexual activity by your partner or ex-partner?: No    FAMILY HISTORY:  Family History  Problem Relation Age of Onset   Hypertension Mother    Diabetes Mother    Hypertension Son    Diabetes Son    Cancer Father    Cirrhosis Brother    Alcohol abuse Brother    Colon cancer Neg Hx     CURRENT MEDICATIONS:  Outpatient Encounter Medications as of 09/24/2024  Medication Sig   acetaminophen  (TYLENOL ) 500 MG tablet 1 tablet as needed Orally every 6 hrs   atorvastatin  (LIPITOR ) 20 MG tablet Take 20 mg by mouth in the morning.   chlorthalidone (HYGROTON) 25 MG tablet Take 25 mg by mouth every morning.   Cholecalciferol (VITAMIN D3) 125 MCG (5000 UT) CAPS Take 5,000 Units by mouth in the morning.   cycloSPORINE  (RESTASIS ) 0.05 % ophthalmic emulsion Place 1 drop into both eyes 2 (two) times daily as needed (dry eyes).   dexlansoprazole   (DEXILANT ) 60 MG capsule TAKE 1 CAPSULE BY MOUTH EVERY DAY BEFORE BREAKFAST   Diclofenac  Sodium 1.6 % GEL Apply 2 g topically 4 (four) times daily as needed. Apply to left elbow and lower bicep region 4 times daily   famotidine  (PEPCID ) 20 MG tablet Take 1 tablet (20 mg total) by mouth daily before supper.   FARXIGA 10 MG TABS tablet Take 10 mg by mouth in the morning.   Ferrous Sulfate (IRON) 325 (65 Fe) MG TABS Take 65 mg by mouth daily.   Fluocinolone Acetonide 0.01 % OIL Place 1 application in ear(s) 2 (two) times daily as needed (ear itching).   GE100 BLOOD GLUCOSE TEST test strip CHECK BLOOD SUGAR ONCE DAILY OR AS DIRECTED BY PHYSICIAN   ipratropium (ATROVENT) 0.03 % nasal spray Place 2 sprays into both nostrils 3 (three) times daily as needed for rhinitis.   losartan (COZAAR) 100 MG tablet Take 100 mg by mouth in the morning.   meclizine  (ANTIVERT ) 25 MG tablet Take 12.5 mg by mouth 2 (two) times daily as needed.   metFORMIN  (GLUCOPHAGE -XR) 500 MG 24 hr tablet 1,000 mg 2 (two) times daily.   montelukast  (SINGULAIR ) 10 MG tablet TAKE 1 TABLET (10 MG TOTAL) BY MOUTH AT BEDTIME AS NEEDED (FOR ALLERGIES.).   PARoxetine  (PAXIL ) 40 MG tablet TAKE ONE TABLET BY MOUTH EVERY MORNING.   Semaglutide 14 MG TABS Take 14 mg by mouth daily.   verapamil  (CALAN -SR) 240 MG CR tablet TAKE ONE TABLET BY MOUTH EVERY EVENING.   vitamin B-12 (CYANOCOBALAMIN ) 500 MCG tablet Take 500 mcg by mouth in the morning.   No facility-administered encounter medications on file as of 09/24/2024.    ALLERGIES:  Allergies  Allergen Reactions   Prednisone Other (See Comments)    Hair loss    LABORATORY DATA:  I have reviewed the labs as listed.  CBC    Component Value Date/Time  WBC 5.1 09/17/2024 1341   RBC 4.74 09/17/2024 1341   HGB 13.9 09/17/2024 1341   HGB 15.1 09/02/2024 0926   HCT 43.5 09/17/2024 1341   HCT 46.4 09/02/2024 0926   PLT 88 (L) 09/17/2024 1341   PLT 101 (L) 09/02/2024 0926   MCV 91.8  09/17/2024 1341   MCV 91 09/02/2024 0926   MCH 29.3 09/17/2024 1341   MCHC 32.0 09/17/2024 1341   RDW 12.9 09/17/2024 1341   RDW 11.8 09/02/2024 0926   LYMPHSABS 1.2 09/17/2024 1341   LYMPHSABS 1.4 09/02/2024 0926   MONOABS 0.5 09/17/2024 1341   EOSABS 0.3 09/17/2024 1341   EOSABS 0.3 09/02/2024 0926   BASOSABS 0.0 09/17/2024 1341   BASOSABS 0.1 09/02/2024 0926      Latest Ref Rng & Units 09/02/2024    9:26 AM 04/10/2024    3:20 PM 11/21/2023    9:06 AM  CMP  Glucose 70 - 99 mg/dL 883  96  858   BUN 8 - 27 mg/dL 31  30  30    Creatinine 0.57 - 1.00 mg/dL 8.65  8.61  8.74   Sodium 134 - 144 mmol/L 141  140  138   Potassium 3.5 - 5.2 mmol/L 4.2  3.8  3.7   Chloride 96 - 106 mmol/L 99  105  101   CO2 20 - 29 mmol/L 25  24  24    Calcium  8.7 - 10.3 mg/dL 89.0  89.7  89.5   Total Protein 6.0 - 8.5 g/dL 8.0  7.8    Total Bilirubin 0.0 - 1.2 mg/dL 0.4  0.7    Alkaline Phos 49 - 135 IU/L 92  73    AST 0 - 40 IU/L 40  26    ALT 0 - 32 IU/L 27  18      DIAGNOSTIC IMAGING:  I have independently reviewed the relevant imaging and discussed with the patient.   WRAP UP:  All questions were answered. The patient knows to call the clinic with any problems, questions or concerns.  Medical decision making: Low***  Time spent on visit: I spent 15 minutes counseling the patient face to face. The total time spent in the appointment was 22 minutes and more than 50% was on counseling.  Pleasant CHRISTELLA Barefoot, PA-C  ***

## 2024-09-24 ENCOUNTER — Inpatient Hospital Stay (HOSPITAL_BASED_OUTPATIENT_CLINIC_OR_DEPARTMENT_OTHER): Admitting: Physician Assistant

## 2024-09-24 VITALS — BP 121/72 | HR 79 | Temp 97.5°F | Resp 18 | Ht 61.0 in | Wt 180.0 lb

## 2024-09-24 DIAGNOSIS — D696 Thrombocytopenia, unspecified: Secondary | ICD-10-CM | POA: Diagnosis not present

## 2024-09-24 DIAGNOSIS — E538 Deficiency of other specified B group vitamins: Secondary | ICD-10-CM

## 2024-09-24 DIAGNOSIS — E559 Vitamin D deficiency, unspecified: Secondary | ICD-10-CM | POA: Diagnosis not present

## 2024-09-24 DIAGNOSIS — D509 Iron deficiency anemia, unspecified: Secondary | ICD-10-CM

## 2024-09-24 NOTE — Patient Instructions (Signed)
 [facility] at [facility] Discharge Instructions  You were seen today by Pleasant Barefoot PA-C for your low platelets and anemia (low red blood cells).  LOW PLATELETS: Your low platelets are mildly low, but stable at their baseline.  We will continue to watch these, but there is no treatment we need to give you for them right now.  IRON DEFICIENCY ANEMIA: Your blood and iron levels looked great!   Keep taking your vitamin B12, iron, and vitamin D  pills.  LABS: Return in 6 months for repeat labs  FOLLOW-UP APPOINTMENT: Office visit in 6 months.   ** Thank you for trusting me with your healthcare!  I strive to provide all of my patients with quality care at each visit.  If you receive a survey for this visit, I would be so grateful to you for taking the time to provide feedback.  Thank you in advance!  ~ Bentlee Drier                                        Dr. Mickiel Davonna Pleasant Barefoot, PA-C     Delon Hope, NP   - - - - - - - - - - - - - - - - - -     Thank you for choosing [facility] at [facility] to provide your oncology and hematology care.  To afford each patient quality time with our provider, please arrive at least 15 minutes before your scheduled appointment time.   If you have a lab appointment with the Cancer Center please come in thru the Main Entrance and check in at the main information desk.  You need to re-schedule your appointment should you arrive 10 or more minutes late.  We strive to give you quality time with our providers, and arriving late affects you and other patients whose appointments are after yours.  Also, if you no show three or more times for appointments you may be dismissed from the clinic at the providers discretion.     Again, thank you for choosing [facility].  Our hope is that these requests will decrease the amount of time that you wait before being seen by our physicians.        _____________________________________________________________  Should you have questions after your visit to [facility], please contact our office at [phone] and follow the prompts.  Our office hours are 8:00 a.m. and 4:30 p.m. Monday - Friday.  Please note that voicemails left after 4:00 p.m. may not be returned until the following business day.  We are closed weekends and major holidays.  You do have access to a nurse 24-7, just call the main number to the clinic [phone] and do not press any options, hold on the line and a nurse will answer the phone.    For prescription refill requests, have your pharmacy contact our office and allow 72 hours.    Due to Covid, you will need to wear a mask upon entering the hospital. If you do not have a mask, a mask will be given to you at the Main Entrance upon arrival. For doctor visits, patients may have 1 support person age 19 or older with them. For treatment visits, patients can not have anyone with them due to social distancing guidelines and our immunocompromised population.

## 2024-09-28 ENCOUNTER — Ambulatory Visit: Payer: Self-pay | Admitting: Gastroenterology

## 2024-09-28 MED ORDER — HYDROCORTISONE (PERIANAL) 2.5 % EX CREA: 1.0000 | TOPICAL_CREAM | Freq: Two times a day (BID) | CUTANEOUS | 0 refills | Status: AC

## 2024-10-16 ENCOUNTER — Encounter: Payer: Self-pay | Admitting: Gastroenterology

## 2025-03-25 ENCOUNTER — Inpatient Hospital Stay

## 2025-04-01 ENCOUNTER — Inpatient Hospital Stay: Admitting: Physician Assistant
